# Patient Record
Sex: Female | Born: 1955 | Race: Black or African American | Hispanic: No | Smoking: Former smoker
Health system: Southern US, Community
[De-identification: ages and names within clinical notes are randomized; demographics above are authoritative.]

## PROBLEM LIST (undated history)

## (undated) DIAGNOSIS — R112 Nausea with vomiting, unspecified: Secondary | ICD-10-CM

## (undated) DIAGNOSIS — M545 Low back pain, unspecified: Secondary | ICD-10-CM

## (undated) DIAGNOSIS — J189 Pneumonia, unspecified organism: Secondary | ICD-10-CM

## (undated) DIAGNOSIS — K5090 Crohn's disease, unspecified, without complications: Secondary | ICD-10-CM

## (undated) DIAGNOSIS — M1990 Unspecified osteoarthritis, unspecified site: Secondary | ICD-10-CM

## (undated) DIAGNOSIS — Z932 Ileostomy status: Secondary | ICD-10-CM

## (undated) DIAGNOSIS — F419 Anxiety disorder, unspecified: Secondary | ICD-10-CM

## (undated) DIAGNOSIS — I10 Essential (primary) hypertension: Secondary | ICD-10-CM

## (undated) DIAGNOSIS — N189 Chronic kidney disease, unspecified: Secondary | ICD-10-CM

## (undated) DIAGNOSIS — R102 Pelvic and perineal pain: Secondary | ICD-10-CM

## (undated) DIAGNOSIS — K6289 Other specified diseases of anus and rectum: Secondary | ICD-10-CM

## (undated) DIAGNOSIS — D649 Anemia, unspecified: Secondary | ICD-10-CM

## (undated) DIAGNOSIS — Z9289 Personal history of other medical treatment: Secondary | ICD-10-CM

## (undated) DIAGNOSIS — F329 Major depressive disorder, single episode, unspecified: Secondary | ICD-10-CM

## (undated) DIAGNOSIS — K219 Gastro-esophageal reflux disease without esophagitis: Secondary | ICD-10-CM

## (undated) DIAGNOSIS — Z9889 Other specified postprocedural states: Secondary | ICD-10-CM

## (undated) DIAGNOSIS — K5190 Ulcerative colitis, unspecified, without complications: Secondary | ICD-10-CM

## (undated) DIAGNOSIS — A0472 Enterocolitis due to Clostridium difficile, not specified as recurrent: Secondary | ICD-10-CM

## (undated) DIAGNOSIS — F32A Depression, unspecified: Secondary | ICD-10-CM

## (undated) HISTORY — DX: Essential (primary) hypertension: I10

## (undated) HISTORY — DX: Other specified diseases of anus and rectum: K62.89

## (undated) HISTORY — DX: Anxiety disorder, unspecified: F41.9

## (undated) HISTORY — DX: Depression, unspecified: F32.A

## (undated) HISTORY — DX: Major depressive disorder, single episode, unspecified: F32.9

## (undated) HISTORY — DX: Gastro-esophageal reflux disease without esophagitis: K21.9

## (undated) HISTORY — PX: ILEOSTOMY: SHX1783

## (undated) HISTORY — DX: Pelvic and perineal pain: R10.2

## (undated) HISTORY — DX: Low back pain, unspecified: M54.50

## (undated) HISTORY — DX: Chronic kidney disease, unspecified: N18.9

## (undated) HISTORY — DX: Crohn's disease, unspecified, without complications: K50.90

## (undated) HISTORY — PX: TUBAL LIGATION: SHX77

## (undated) HISTORY — DX: Low back pain: M54.5

## (undated) HISTORY — DX: Unspecified osteoarthritis, unspecified site: M19.90

## (undated) HISTORY — PX: TONSILLECTOMY: SUR1361

---

## 1981-04-20 HISTORY — PX: TUBAL LIGATION: SHX77

## 1998-03-04 ENCOUNTER — Other Ambulatory Visit: Admission: RE | Admit: 1998-03-04 | Discharge: 1998-03-04 | Payer: Self-pay | Admitting: Gastroenterology

## 1998-05-14 ENCOUNTER — Encounter: Payer: Self-pay | Admitting: Gastroenterology

## 1998-05-14 ENCOUNTER — Inpatient Hospital Stay (HOSPITAL_COMMUNITY): Admission: EM | Admit: 1998-05-14 | Discharge: 1998-05-21 | Payer: Self-pay | Admitting: Emergency Medicine

## 1998-06-21 ENCOUNTER — Inpatient Hospital Stay (HOSPITAL_COMMUNITY): Admission: AD | Admit: 1998-06-21 | Discharge: 1998-07-03 | Payer: Self-pay | Admitting: Gastroenterology

## 1998-06-21 ENCOUNTER — Encounter: Payer: Self-pay | Admitting: Gastroenterology

## 1998-06-22 ENCOUNTER — Encounter: Payer: Self-pay | Admitting: Gastroenterology

## 1998-09-10 ENCOUNTER — Inpatient Hospital Stay (HOSPITAL_COMMUNITY): Admission: RE | Admit: 1998-09-10 | Discharge: 1998-09-18 | Payer: Self-pay | Admitting: General Surgery

## 1999-04-21 HISTORY — PX: COLECTOMY: SHX59

## 2001-11-30 ENCOUNTER — Ambulatory Visit (HOSPITAL_COMMUNITY): Admission: RE | Admit: 2001-11-30 | Discharge: 2001-11-30 | Payer: Self-pay | Admitting: General Surgery

## 2001-11-30 ENCOUNTER — Encounter: Payer: Self-pay | Admitting: General Surgery

## 2001-12-02 ENCOUNTER — Encounter: Payer: Self-pay | Admitting: General Surgery

## 2001-12-02 ENCOUNTER — Ambulatory Visit (HOSPITAL_COMMUNITY): Admission: RE | Admit: 2001-12-02 | Discharge: 2001-12-02 | Payer: Self-pay | Admitting: General Surgery

## 2001-12-09 ENCOUNTER — Ambulatory Visit (HOSPITAL_COMMUNITY): Admission: RE | Admit: 2001-12-09 | Discharge: 2001-12-09 | Payer: Self-pay | Admitting: General Surgery

## 2001-12-09 ENCOUNTER — Encounter: Payer: Self-pay | Admitting: General Surgery

## 2001-12-09 ENCOUNTER — Encounter (INDEPENDENT_AMBULATORY_CARE_PROVIDER_SITE_OTHER): Payer: Self-pay | Admitting: Specialist

## 2002-07-04 ENCOUNTER — Ambulatory Visit (HOSPITAL_COMMUNITY): Admission: RE | Admit: 2002-07-04 | Discharge: 2002-07-04 | Payer: Self-pay | Admitting: Gastroenterology

## 2002-07-04 ENCOUNTER — Encounter: Payer: Self-pay | Admitting: Gastroenterology

## 2003-02-17 ENCOUNTER — Encounter: Admission: RE | Admit: 2003-02-17 | Discharge: 2003-02-17 | Payer: Self-pay | Admitting: Internal Medicine

## 2003-10-13 ENCOUNTER — Encounter: Admission: RE | Admit: 2003-10-13 | Discharge: 2003-10-13 | Payer: Self-pay | Admitting: Internal Medicine

## 2004-05-07 ENCOUNTER — Ambulatory Visit: Payer: Self-pay | Admitting: Internal Medicine

## 2004-05-14 ENCOUNTER — Encounter: Admission: RE | Admit: 2004-05-14 | Discharge: 2004-08-12 | Payer: Self-pay | Admitting: Internal Medicine

## 2004-06-05 ENCOUNTER — Ambulatory Visit: Payer: Self-pay | Admitting: Internal Medicine

## 2004-07-03 ENCOUNTER — Ambulatory Visit: Payer: Self-pay | Admitting: Internal Medicine

## 2004-07-29 ENCOUNTER — Ambulatory Visit: Payer: Self-pay | Admitting: Internal Medicine

## 2004-08-04 ENCOUNTER — Ambulatory Visit: Payer: Self-pay | Admitting: Internal Medicine

## 2004-08-25 ENCOUNTER — Ambulatory Visit: Payer: Self-pay | Admitting: Internal Medicine

## 2004-09-01 ENCOUNTER — Ambulatory Visit: Payer: Self-pay | Admitting: Internal Medicine

## 2004-10-01 ENCOUNTER — Ambulatory Visit: Payer: Self-pay | Admitting: Internal Medicine

## 2004-11-18 ENCOUNTER — Ambulatory Visit: Payer: Self-pay | Admitting: Internal Medicine

## 2004-12-23 ENCOUNTER — Ambulatory Visit: Payer: Self-pay | Admitting: Internal Medicine

## 2005-03-19 ENCOUNTER — Ambulatory Visit: Payer: Self-pay | Admitting: Internal Medicine

## 2005-04-27 ENCOUNTER — Ambulatory Visit: Payer: Self-pay | Admitting: Internal Medicine

## 2005-06-30 ENCOUNTER — Ambulatory Visit: Payer: Self-pay | Admitting: Internal Medicine

## 2005-08-13 ENCOUNTER — Ambulatory Visit: Payer: Self-pay | Admitting: Internal Medicine

## 2005-08-26 ENCOUNTER — Ambulatory Visit: Payer: Self-pay | Admitting: Internal Medicine

## 2005-09-23 ENCOUNTER — Ambulatory Visit: Payer: Self-pay | Admitting: Internal Medicine

## 2005-10-12 ENCOUNTER — Ambulatory Visit: Payer: Self-pay | Admitting: Internal Medicine

## 2005-12-15 ENCOUNTER — Ambulatory Visit: Payer: Self-pay | Admitting: Internal Medicine

## 2006-02-15 ENCOUNTER — Ambulatory Visit: Payer: Self-pay | Admitting: Internal Medicine

## 2006-04-30 ENCOUNTER — Ambulatory Visit: Payer: Self-pay | Admitting: Internal Medicine

## 2006-04-30 LAB — CONVERTED CEMR LAB
Free T4: 0.7 ng/dL (ref 0.6–1.6)
TSH: 0.74 microintl units/mL (ref 0.35–5.50)

## 2006-05-17 ENCOUNTER — Ambulatory Visit: Payer: Self-pay | Admitting: Internal Medicine

## 2006-07-19 ENCOUNTER — Ambulatory Visit: Payer: Self-pay | Admitting: Internal Medicine

## 2006-08-14 ENCOUNTER — Ambulatory Visit: Payer: Self-pay | Admitting: Internal Medicine

## 2006-09-07 ENCOUNTER — Ambulatory Visit: Payer: Self-pay | Admitting: Internal Medicine

## 2006-09-07 LAB — CONVERTED CEMR LAB
ALT: 22 units/L (ref 0–40)
AST: 18 units/L (ref 0–37)
BUN: 19 mg/dL (ref 6–23)
Basophils Absolute: 0.1 10*3/uL (ref 0.0–0.1)
Basophils Relative: 0.9 % (ref 0.0–1.0)
Cholesterol: 172 mg/dL (ref 0–200)
Creatinine, Ser: 0.8 mg/dL (ref 0.4–1.2)
Eosinophils Absolute: 0 10*3/uL (ref 0.0–0.6)
Eosinophils Relative: 0.5 % (ref 0.0–5.0)
Glucose, Bld: 82 mg/dL (ref 70–99)
HCT: 35.2 % — ABNORMAL LOW (ref 36.0–46.0)
HDL: 62.5 mg/dL (ref >39.0)
Hemoglobin: 11.7 g/dL — ABNORMAL LOW (ref 12.0–15.0)
Hgb A1c MFr Bld: 5.7 % (ref 4.6–6.1)
LDL Cholesterol: 93 mg/dL (ref 0–99)
Lymphocytes Relative: 27.6 % (ref 12.0–46.0)
MCHC: 33.3 g/dL (ref 30.0–36.0)
MCV: 86.1 fL (ref 78.0–100.0)
Monocytes Absolute: 0.6 10*3/uL (ref 0.2–0.7)
Monocytes Relative: 7.1 % (ref 3.0–11.0)
Neutro Abs: 5.9 10*3/uL (ref 1.4–7.7)
Neutrophils Relative %: 63.9 % (ref 43.0–77.0)
Platelets: 311 10*3/uL (ref 150–400)
Potassium: 3.1 meq/L — ABNORMAL LOW (ref 3.5–5.1)
RBC: 4.09 M/uL (ref 3.87–5.11)
RDW: 14.4 % (ref 11.5–14.6)
Sed Rate: 17 mm/hr (ref 0–25)
Sodium: 138 meq/L (ref 135–145)
TSH: 0.64 microintl units/mL (ref 0.35–5.50)
Total CHOL/HDL Ratio: 2.8
Triglycerides: 84 mg/dL (ref 0–149)
VLDL: 17 mg/dL (ref 0–40)
Vit D, 1,25-Dihydroxy: 14 — ABNORMAL LOW (ref 20–57)
Vitamin B-12: 783 pg/mL (ref 211–911)
WBC: 9.1 10*3/uL (ref 4.5–10.5)

## 2006-09-14 ENCOUNTER — Ambulatory Visit: Payer: Self-pay | Admitting: Internal Medicine

## 2006-11-08 DIAGNOSIS — K5090 Crohn's disease, unspecified, without complications: Secondary | ICD-10-CM

## 2006-11-08 DIAGNOSIS — F329 Major depressive disorder, single episode, unspecified: Secondary | ICD-10-CM

## 2006-11-08 DIAGNOSIS — F410 Panic disorder [episodic paroxysmal anxiety] without agoraphobia: Secondary | ICD-10-CM | POA: Insufficient documentation

## 2006-11-08 DIAGNOSIS — I10 Essential (primary) hypertension: Secondary | ICD-10-CM

## 2006-11-08 DIAGNOSIS — E538 Deficiency of other specified B group vitamins: Secondary | ICD-10-CM | POA: Insufficient documentation

## 2006-11-19 ENCOUNTER — Ambulatory Visit: Payer: Self-pay | Admitting: Internal Medicine

## 2007-02-03 ENCOUNTER — Ambulatory Visit: Payer: Self-pay | Admitting: Internal Medicine

## 2007-02-03 LAB — CONVERTED CEMR LAB
ALT: 16 units/L (ref 0–35)
AST: 16 units/L (ref 0–37)
Albumin: 3.6 g/dL (ref 3.5–5.2)
Alkaline Phosphatase: 66 units/L (ref 39–117)
BUN: 18 mg/dL (ref 6–23)
Basophils Absolute: 0.1 10*3/uL (ref 0.0–0.1)
Basophils Relative: 1.2 % — ABNORMAL HIGH (ref 0.0–1.0)
Bilirubin, Direct: 0.1 mg/dL (ref 0.0–0.3)
CO2: 27 meq/L (ref 19–32)
Calcium: 9.4 mg/dL (ref 8.4–10.5)
Chloride: 104 meq/L (ref 96–112)
Cholesterol: 165 mg/dL (ref 0–200)
Creatinine, Ser: 0.8 mg/dL (ref 0.4–1.2)
Eosinophils Absolute: 0.3 10*3/uL (ref 0.0–0.6)
Eosinophils Relative: 3.7 % (ref 0.0–5.0)
GFR calc Af Amer: 97 mL/min
GFR calc non Af Amer: 80 mL/min
Glucose, Bld: 95 mg/dL (ref 70–99)
HCT: 33.1 % — ABNORMAL LOW (ref 36.0–46.0)
HDL: 53.5 mg/dL (ref >39.0)
Hemoglobin: 11.2 g/dL — ABNORMAL LOW (ref 12.0–15.0)
LDL Cholesterol: 96 mg/dL (ref 0–99)
Lymphocytes Relative: 30.3 % (ref 12.0–46.0)
MCHC: 34 g/dL (ref 30.0–36.0)
MCV: 84 fL (ref 78.0–100.0)
Monocytes Absolute: 0.6 10*3/uL (ref 0.2–0.7)
Monocytes Relative: 7.6 % (ref 3.0–11.0)
Neutro Abs: 4.9 10*3/uL (ref 1.4–7.7)
Neutrophils Relative %: 57.2 % (ref 43.0–77.0)
Platelets: 277 10*3/uL (ref 150–400)
Potassium: 4.1 meq/L (ref 3.5–5.1)
RBC: 3.94 M/uL (ref 3.87–5.11)
RDW: 16.2 % — ABNORMAL HIGH (ref 11.5–14.6)
Sodium: 139 meq/L (ref 135–145)
TSH: 0.91 microintl units/mL (ref 0.35–5.50)
Total Bilirubin: 0.6 mg/dL (ref 0.3–1.2)
Total CHOL/HDL Ratio: 3.1
Total Protein: 7.2 g/dL (ref 6.0–8.3)
Triglycerides: 79 mg/dL (ref 0–149)
VLDL: 16 mg/dL (ref 0–40)
Vit D, 1,25-Dihydroxy: 23 — ABNORMAL LOW (ref 30–89)
WBC: 8.4 10*3/uL (ref 4.5–10.5)

## 2007-02-17 ENCOUNTER — Encounter: Payer: Self-pay | Admitting: Internal Medicine

## 2007-02-17 ENCOUNTER — Ambulatory Visit: Payer: Self-pay | Admitting: Internal Medicine

## 2007-02-17 DIAGNOSIS — F419 Anxiety disorder, unspecified: Secondary | ICD-10-CM | POA: Insufficient documentation

## 2007-02-17 DIAGNOSIS — F411 Generalized anxiety disorder: Secondary | ICD-10-CM

## 2007-02-17 DIAGNOSIS — M545 Low back pain, unspecified: Secondary | ICD-10-CM | POA: Insufficient documentation

## 2007-02-17 DIAGNOSIS — K219 Gastro-esophageal reflux disease without esophagitis: Secondary | ICD-10-CM

## 2007-03-25 ENCOUNTER — Telehealth: Payer: Self-pay | Admitting: Internal Medicine

## 2007-04-26 ENCOUNTER — Ambulatory Visit: Payer: Self-pay | Admitting: Internal Medicine

## 2007-04-26 DIAGNOSIS — J069 Acute upper respiratory infection, unspecified: Secondary | ICD-10-CM

## 2007-05-26 ENCOUNTER — Telehealth: Payer: Self-pay | Admitting: Internal Medicine

## 2007-06-06 ENCOUNTER — Encounter: Payer: Self-pay | Admitting: Internal Medicine

## 2007-06-13 ENCOUNTER — Telehealth: Payer: Self-pay | Admitting: Internal Medicine

## 2007-07-06 ENCOUNTER — Telehealth: Payer: Self-pay | Admitting: Internal Medicine

## 2007-07-15 ENCOUNTER — Ambulatory Visit: Payer: Self-pay | Admitting: Internal Medicine

## 2007-07-15 DIAGNOSIS — R635 Abnormal weight gain: Secondary | ICD-10-CM | POA: Insufficient documentation

## 2007-07-15 DIAGNOSIS — E559 Vitamin D deficiency, unspecified: Secondary | ICD-10-CM | POA: Insufficient documentation

## 2007-09-16 ENCOUNTER — Ambulatory Visit: Payer: Self-pay | Admitting: Internal Medicine

## 2007-09-16 DIAGNOSIS — M79609 Pain in unspecified limb: Secondary | ICD-10-CM | POA: Insufficient documentation

## 2007-09-21 ENCOUNTER — Encounter: Payer: Self-pay | Admitting: Internal Medicine

## 2007-09-21 ENCOUNTER — Ambulatory Visit: Payer: Self-pay

## 2007-10-07 ENCOUNTER — Ambulatory Visit: Payer: Self-pay | Admitting: Internal Medicine

## 2007-10-08 LAB — CONVERTED CEMR LAB
Albumin: 3.7 g/dL (ref 3.5–5.2)
BUN: 9 mg/dL (ref 6–23)
Calcium: 9.5 mg/dL (ref 8.4–10.5)
Eosinophils Absolute: 0.3 10*3/uL (ref 0.0–0.7)
Eosinophils Relative: 4.5 % (ref 0.0–5.0)
GFR calc Af Amer: 135 mL/min
GFR calc non Af Amer: 112 mL/min
Glucose, Bld: 101 mg/dL — ABNORMAL HIGH (ref 70–99)
HCT: 34.4 % — ABNORMAL LOW (ref 36.0–46.0)
Hemoglobin: 12 g/dL (ref 12.0–15.0)
MCV: 85.8 fL (ref 78.0–100.0)
Monocytes Absolute: 0.5 10*3/uL (ref 0.1–1.0)
Monocytes Relative: 7.1 % (ref 3.0–12.0)
Neutro Abs: 4.2 10*3/uL (ref 1.4–7.7)
Platelets: 264 10*3/uL (ref 150–400)
RDW: 15 % — ABNORMAL HIGH (ref 11.5–14.6)
TSH: 0.6 microintl units/mL (ref 0.35–5.50)
Total Protein: 6.9 g/dL (ref 6.0–8.3)
Vitamin B-12: 671 pg/mL (ref 211–911)
WBC: 7.3 10*3/uL (ref 4.5–10.5)

## 2007-10-10 ENCOUNTER — Telehealth (INDEPENDENT_AMBULATORY_CARE_PROVIDER_SITE_OTHER): Payer: Self-pay | Admitting: *Deleted

## 2007-10-12 ENCOUNTER — Ambulatory Visit: Payer: Self-pay | Admitting: Internal Medicine

## 2007-10-17 ENCOUNTER — Telehealth (INDEPENDENT_AMBULATORY_CARE_PROVIDER_SITE_OTHER): Payer: Self-pay | Admitting: *Deleted

## 2007-10-18 ENCOUNTER — Ambulatory Visit: Payer: Self-pay | Admitting: Internal Medicine

## 2007-10-18 DIAGNOSIS — R5382 Chronic fatigue, unspecified: Secondary | ICD-10-CM

## 2007-10-18 DIAGNOSIS — G4700 Insomnia, unspecified: Secondary | ICD-10-CM

## 2007-10-24 ENCOUNTER — Telehealth: Payer: Self-pay | Admitting: Internal Medicine

## 2007-11-16 ENCOUNTER — Ambulatory Visit: Payer: Self-pay | Admitting: Internal Medicine

## 2007-12-23 ENCOUNTER — Ambulatory Visit: Payer: Self-pay | Admitting: Internal Medicine

## 2007-12-28 LAB — CONVERTED CEMR LAB
Calcium: 9.2 mg/dL (ref 8.4–10.5)
GFR calc Af Amer: 113 mL/min
GFR calc non Af Amer: 93 mL/min
Glucose, Bld: 91 mg/dL (ref 70–99)
Potassium: 3.2 meq/L — ABNORMAL LOW (ref 3.5–5.1)
Sodium: 141 meq/L (ref 135–145)
Vit D, 1,25-Dihydroxy: 18 — ABNORMAL LOW (ref 30–89)
Vitamin B-12: 707 pg/mL (ref 211–911)

## 2008-01-20 ENCOUNTER — Ambulatory Visit: Payer: Self-pay | Admitting: Internal Medicine

## 2008-03-06 ENCOUNTER — Telehealth: Payer: Self-pay | Admitting: Internal Medicine

## 2008-03-20 ENCOUNTER — Ambulatory Visit: Payer: Self-pay | Admitting: Internal Medicine

## 2008-05-09 ENCOUNTER — Ambulatory Visit: Payer: Self-pay | Admitting: Internal Medicine

## 2008-05-09 LAB — CONVERTED CEMR LAB
CO2: 27 meq/L (ref 19–32)
Calcium: 9.2 mg/dL (ref 8.4–10.5)
Creatinine, Ser: 0.7 mg/dL (ref 0.4–1.2)
GFR calc Af Amer: 113 mL/min
Sodium: 138 meq/L (ref 135–145)

## 2008-05-22 ENCOUNTER — Ambulatory Visit: Payer: Self-pay | Admitting: Internal Medicine

## 2008-07-16 ENCOUNTER — Encounter: Payer: Self-pay | Admitting: Internal Medicine

## 2008-08-13 ENCOUNTER — Ambulatory Visit: Payer: Self-pay | Admitting: Internal Medicine

## 2008-08-13 DIAGNOSIS — M797 Fibromyalgia: Secondary | ICD-10-CM | POA: Insufficient documentation

## 2008-10-11 ENCOUNTER — Encounter: Payer: Self-pay | Admitting: Internal Medicine

## 2008-10-25 ENCOUNTER — Telehealth: Payer: Self-pay | Admitting: Internal Medicine

## 2008-10-27 ENCOUNTER — Ambulatory Visit: Payer: Self-pay | Admitting: Family Medicine

## 2008-10-29 ENCOUNTER — Telehealth: Payer: Self-pay | Admitting: Internal Medicine

## 2008-10-31 ENCOUNTER — Telehealth: Payer: Self-pay | Admitting: Internal Medicine

## 2008-11-05 ENCOUNTER — Telehealth: Payer: Self-pay | Admitting: Internal Medicine

## 2008-11-06 ENCOUNTER — Ambulatory Visit: Payer: Self-pay | Admitting: Internal Medicine

## 2008-11-06 DIAGNOSIS — J189 Pneumonia, unspecified organism: Secondary | ICD-10-CM

## 2008-11-06 DIAGNOSIS — R05 Cough: Secondary | ICD-10-CM

## 2008-11-09 ENCOUNTER — Ambulatory Visit: Payer: Self-pay | Admitting: Internal Medicine

## 2008-11-20 ENCOUNTER — Ambulatory Visit: Payer: Self-pay | Admitting: Internal Medicine

## 2009-01-03 ENCOUNTER — Telehealth: Payer: Self-pay | Admitting: Internal Medicine

## 2009-01-04 ENCOUNTER — Telehealth: Payer: Self-pay | Admitting: Internal Medicine

## 2009-01-07 ENCOUNTER — Telehealth: Payer: Self-pay | Admitting: Internal Medicine

## 2009-02-14 ENCOUNTER — Encounter (INDEPENDENT_AMBULATORY_CARE_PROVIDER_SITE_OTHER): Payer: Self-pay | Admitting: *Deleted

## 2009-02-14 ENCOUNTER — Ambulatory Visit: Payer: Self-pay | Admitting: Internal Medicine

## 2009-02-14 DIAGNOSIS — R1011 Right upper quadrant pain: Secondary | ICD-10-CM | POA: Insufficient documentation

## 2009-02-14 LAB — CONVERTED CEMR LAB
Albumin: 4 g/dL (ref 3.5–5.2)
Basophils Relative: 1.3 % (ref 0.0–3.0)
Bilirubin, Direct: 0.1 mg/dL (ref 0.0–0.3)
CO2: 26 meq/L (ref 19–32)
Calcium: 9.1 mg/dL (ref 8.4–10.5)
Creatinine, Ser: 0.6 mg/dL (ref 0.4–1.2)
Eosinophils Relative: 3.9 % (ref 0.0–5.0)
GFR calc non Af Amer: 134.29 mL/min (ref >60)
Hemoglobin: 12.6 g/dL (ref 12.0–15.0)
Lymphocytes Relative: 33.5 % (ref 12.0–46.0)
MCV: 87.9 fL (ref 78.0–100.0)
Neutrophils Relative %: 55.5 % (ref 43.0–77.0)
RBC: 4.21 M/uL (ref 3.87–5.11)
Specific Gravity, Urine: 1.025 (ref 1.000–1.030)
Total Protein, Urine: NEGATIVE mg/dL
Total Protein: 8.1 g/dL (ref 6.0–8.3)
Urine Glucose: NEGATIVE mg/dL
Vitamin B-12: 1439 pg/mL — ABNORMAL HIGH (ref 211–911)
WBC: 8 10*3/uL (ref 4.5–10.5)

## 2009-04-16 ENCOUNTER — Ambulatory Visit: Payer: Self-pay | Admitting: Internal Medicine

## 2009-04-20 HISTORY — PX: ILEOSTOMY: SHX1783

## 2009-05-29 ENCOUNTER — Telehealth: Payer: Self-pay | Admitting: Internal Medicine

## 2009-06-07 ENCOUNTER — Ambulatory Visit: Payer: Self-pay | Admitting: Internal Medicine

## 2009-06-07 LAB — CONVERTED CEMR LAB
BUN: 15 mg/dL (ref 6–23)
CO2: 29 meq/L (ref 19–32)
Calcium: 9.5 mg/dL (ref 8.4–10.5)
Chloride: 98 meq/L (ref 96–112)
Creatinine, Ser: 0.6 mg/dL (ref 0.4–1.2)
GFR calc non Af Amer: 134.14 mL/min (ref >60)
Glucose, Bld: 88 mg/dL (ref 70–99)
Potassium: 3.5 meq/L (ref 3.5–5.1)
Sodium: 138 meq/L (ref 135–145)

## 2009-06-18 ENCOUNTER — Ambulatory Visit: Payer: Self-pay | Admitting: Internal Medicine

## 2009-06-18 DIAGNOSIS — M25569 Pain in unspecified knee: Secondary | ICD-10-CM

## 2009-06-18 DIAGNOSIS — M25561 Pain in right knee: Secondary | ICD-10-CM | POA: Insufficient documentation

## 2009-06-18 DIAGNOSIS — M1990 Unspecified osteoarthritis, unspecified site: Secondary | ICD-10-CM

## 2009-08-21 ENCOUNTER — Ambulatory Visit: Payer: Self-pay | Admitting: Internal Medicine

## 2009-10-23 ENCOUNTER — Ambulatory Visit: Payer: Self-pay | Admitting: Internal Medicine

## 2009-10-23 DIAGNOSIS — F4321 Adjustment disorder with depressed mood: Secondary | ICD-10-CM | POA: Insufficient documentation

## 2009-10-25 ENCOUNTER — Telehealth: Payer: Self-pay | Admitting: Internal Medicine

## 2009-12-17 ENCOUNTER — Ambulatory Visit: Payer: Self-pay | Admitting: Internal Medicine

## 2009-12-17 LAB — CONVERTED CEMR LAB
Alkaline Phosphatase: 77 units/L (ref 39–117)
Basophils Relative: 0.5 % (ref 0.0–3.0)
Bilirubin, Direct: 0.1 mg/dL (ref 0.0–0.3)
CO2: 26 meq/L (ref 19–32)
Calcium: 9.4 mg/dL (ref 8.4–10.5)
Chloride: 102 meq/L (ref 96–112)
Eosinophils Absolute: 0.3 10*3/uL (ref 0.0–0.7)
Lymphs Abs: 2.8 10*3/uL (ref 0.7–4.0)
MCHC: 34.3 g/dL (ref 30.0–36.0)
MCV: 88.5 fL (ref 78.0–100.0)
Monocytes Absolute: 0.4 10*3/uL (ref 0.1–1.0)
Neutro Abs: 3.3 10*3/uL (ref 1.4–7.7)
Neutrophils Relative %: 47.9 % (ref 43.0–77.0)
RBC: 4 M/uL (ref 3.87–5.11)
Sodium: 139 meq/L (ref 135–145)
Total Protein: 7.3 g/dL (ref 6.0–8.3)
Vitamin B-12: 1035 pg/mL — ABNORMAL HIGH (ref 211–911)

## 2009-12-25 ENCOUNTER — Ambulatory Visit: Payer: Self-pay | Admitting: Internal Medicine

## 2010-02-27 ENCOUNTER — Ambulatory Visit: Payer: Self-pay | Admitting: Internal Medicine

## 2010-04-12 ENCOUNTER — Emergency Department (HOSPITAL_COMMUNITY)
Admission: EM | Admit: 2010-04-12 | Discharge: 2010-04-13 | Payer: Self-pay | Source: Home / Self Care | Admitting: Emergency Medicine

## 2010-05-20 NOTE — Assessment & Plan Note (Signed)
Summary: 2 MO ROV /NWS  #   Vital Signs:  Patient profile:   55 year old female Height:      65 inches Weight:      265 pounds BMI:     44.26 O2 Sat:      95 % on Room air Temp:     98.3 degrees F oral Pulse rate:   105 / minute Pulse rhythm:   regular Resp:     16 per minute BP sitting:   102 / 58  (left arm) Cuff size:   large  Vitals Entered By: Jonathon Resides, CMA(AAMA) (December 25, 2009 11:13 AM)  O2 Flow:  Room air CC: 2 mo f/u Comments pt needs Rf on Oxycodone, Phentermine, Synthroid, Lotrisone cream and Diflucan.   She is not taking Vit b12 or Hyzaar....she takes Benicar instead but is unsure of the dose.   CC:  2 mo f/u.  History of Present Illness: The patient presents for a follow up of abd pain and back pain, anxiety, depression. The patient presents with complaints of sore throat, fever, cough, sinus congestion and drainge of several days duration. Not better with OTC meds.   The mucus is colored.    Current Medications (verified): 1)  Oxycodone Hcl 15 Mg Tabs (Oxycodone Hcl) .Marland Kitchen.. 1 By Mouth Three To Four  Times A Day As Needed Pain. Please,  Fill On or After 11/18/09   . Ok To Fill 1-2 Day Earlier When The Month Has 31 Days in It. 2)  Phentermine Hcl 37.5 Mg Tabs (Phentermine Hcl) .Marland Kitchen.. 1 By Mouth Qam 3)  Synthroid 25 Mcg  Tabs (Levothyroxine Sodium) .Marland Kitchen.. 1 By Mouth Once Daily 4)  Neuro B-12 S 1000 Mcg/ml Inj Soln (Cyanocobalamin) .... Q 6 Wks 5)  Vitamin D3 1000 Unit  Tabs (Cholecalciferol) .... 3 By Mouth Once Daily 6)  Lotrisone 1-0.05 % Crea (Clotrimazole-Betamethasone) .... Use Two Times A Day X 4 Wks Prn 7)  Bd Integra Syringe 25g X 1" 3 Ml Misc (Syringe/needle (Disp)) .... For B12 Shots 8)  Savella 50 Mg Tabs (Milnacipran Hcl) .Marland Kitchen.. 1 By Mouth Bid 9)  Align  Caps (Probiotic Product) .Marland Kitchen.. 1 By Mouth Qd 10)  Hyzaar 100-25 Mg Tabs (Losartan Potassium-Hctz) .Marland Kitchen.. 1 By Mouth Qd 11)  Diflucan 150 Mg Tabs (Fluconazole) .Marland Kitchen.. 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  The patient denies chest pain and dyspnea on exertion.    Physical Exam  General:  NAD overweight-appearing.   Nose:  Erythematous throat mucosa and intranasal erythema.  Mouth:   no oropharyngeal exudate, no pharyngeal erythema.   Neck:  no carotid bruit or thyromegaly .nodes Lungs:  CTA Heart:  RRR Abdomen:  Colostomy, NT Msk:  Lumbar-sacral spine is tender to palpation over paraspinal muscles and painfull with the ROM, B knees are sensitive to palpation R>L Extremities:  No edema Neurologic:  No cranial nerve deficits noted. Station and gait are normal. Plantar reflexes are down-going  bilaterally. DTRs are symmetrical throughout. Sensory, motor and coordinative functions appear intact. Skin:  Clear Psych:  Oriented X3 subdued.     Impression & Recommendations:  Problem # 1:  FIBROMYALGIA (ICD-729.1) Assessment Unchanged  Her updated medication list for this problem includes:    Oxycodone Hcl 15 Mg Tabs (Oxycodone hcl) .Marland Kitchen... 1 by mouth three to four  times a day as needed pain. please,  fill on or after 02/01/10   . ok to fill 1-2 day earlier when the month has 31 days in it.  Problem # 2:  WEIGHT GAIN (ICD-783.1) Assessment: Deteriorated Restart Phentermine  Problem # 3:  LOW BACK PAIN (ICD-724.2) Assessment: Unchanged  Her updated medication list for  this problem includes:    Oxycodone Hcl 15 Mg Tabs (Oxycodone hcl) .Marland Kitchen... 1 by mouth three to four  times a day as needed pain. please,  fill on or after 02/01/10   . ok to fill 1-2 day earlier when the month has 31 days in it.  Problem # 4:  ANXIETY (ICD-300.00) Assessment: Unchanged  Problem # 5:  GERD (ICD-530.81) Assessment: Improved  Complete Medication List: 1)  Oxycodone Hcl 15 Mg Tabs (Oxycodone hcl) .Marland Kitchen.. 1 by mouth three to four  times a day as needed pain. please,  fill on or after 02/01/10   . ok to fill 1-2 day earlier when the month has 31 days in it. 2)  Phentermine Hcl 37.5 Mg Tabs (Phentermine hcl) .Marland Kitchen.. 1 by mouth qam 3)  Synthroid 25 Mcg Tabs (Levothyroxine sodium) .Marland Kitchen.. 1 by mouth once daily 4)  Vitamin D3 1000 Unit Tabs (Cholecalciferol) .... 3 by mouth once daily 5)  Lotrisone 1-0.05 % Crea (Clotrimazole-betamethasone) .... Use two times a day x 4 wks prn 6)  Bd Integra Syringe 25g X 1" 3 Ml Misc (Syringe/needle (disp)) .... For b12 shots 7)  Savella 50 Mg Tabs (Milnacipran hcl) .Marland Kitchen.. 1 by mouth bid 8)  Align Caps (Probiotic product) .Marland Kitchen.. 1 by mouth qd 9)  Hyzaar 100-25 Mg Tabs (Losartan potassium-hctz) .Marland Kitchen.. 1 by mouth qd 10)  Diflucan 150 Mg Tabs (Fluconazole) .Marland Kitchen.. 1 by mouth once daily once for yeast infection 11)  Zithromax Z-pak 250 Mg Tabs (Azithromycin) .... As dirrected 12)  Cobal-1000 1000 Mcg/ml Soln (Cyanocobalamin) .Marland Kitchen.. 1 ml im or subcutaneously q 8 wks  Other Orders: Flu Vaccine 60yr + ((29798 Administration Flu vaccine - MCR (([id number]  Patient Instructions: 1)  Please schedule a follow-up appointment in 2 months. Prescriptions: SAVELLA 50 MG TABS (MILNACIPRAN HCL) 1 by mouth bid  #60 x 12   Entered and Authorized by:   ACassandria AngerMD   Signed by:   ACassandria AngerMD on 12/25/2009   Method used:   Print then Give to Patient   RxID:   [id number] INTEGRA SYRINGE 25G X 1" 3 ML MISC (SYRINGE/NEEDLE (DISP)) for B12 shots  #10 x 3    Entered and Authorized by:   ACassandria AngerMD   Signed by:   ACassandria AngerMD on 12/25/2009   Method used:   Print then Give to Patient   RxID:   [id number]-1000 1000 MCG/ML SOLN (CYANOCOBALAMIN) 1 ml IM or Subcutaneously q 8 wks  #157mx 12   Entered and Authorized by:   AlCassandria AngerD   Signed by:   AlCassandria AngerD on 12/25/2009   Method used:   Print then Give to Patient   RxID:  [id number] LOTRISONE 1-0.05 % CREA (CLOTRIMAZOLE-BETAMETHASONE) use two times a day x 4 wks prn  #90 g x 3   Entered and Authorized by:   Cassandria Anger MD   Signed by:   Cassandria Anger MD on 12/25/2009   Method used:   Print then Give to Patient   RxID:   [id number] PHENTERMINE HCL 37.5 MG TABS (PHENTERMINE HCL) 1 by mouth qam  #30 x 1   Entered and Authorized by:   Cassandria Anger MD   Signed by:   Cassandria Anger MD on 12/25/2009   Method used:   Print then Give to Patient   RxID:   [id number] DIFLUCAN 150 MG TABS (FLUCONAZOLE) 1 by mouth once daily once for yeast infection  #3 x 0   Entered and Authorized by:   Cassandria Anger MD   Signed by:   Cassandria Anger MD on 12/25/2009   Method used:   Print then Give to Patient   RxID:   [id number] ZITHROMAX Z-PAK 250 MG TABS (AZITHROMYCIN) as dirrected  #1 x 0   Entered and Authorized by:   Cassandria Anger MD   Signed by:   Cassandria Anger MD on 12/25/2009   Method used:   Print then Give to Patient   RxID:   [id number] OXYCODONE HCL 15 MG TABS (OXYCODONE HCL) 1 by mouth three to four  times a day as needed pain. Please,  fill on or after 02/01/10   . OK to fill 1-2 day earlier when the month has 31 days in it.  #120 x 0   Entered and Authorized by:   Cassandria Anger MD   Signed by:   Cassandria Anger MD on 12/25/2009   Method used:   Print then Give to Patient   RxID:   [id number] OXYCODONE HCL 15 MG TABS (OXYCODONE HCL) 1 by mouth three to  four  times a day as needed pain. Please,  fill on or after 01/02/10   . OK to fill 1-2 day earlier when the month has 31 days in it.  #120 x 0   Entered and Authorized by:   Cassandria Anger MD   Signed by:   Cassandria Anger MD on 12/25/2009   Method used:   Print then Give to Patient   RxID:   [id number]   .[email or web address]   Flu Vaccine Consent Questions     Do you have a history of severe allergic reactions to this vaccine? no    Any prior history of allergic reactions to egg and/or gelatin? no    Do you have a sensitivity to the preservative Thimersol? no    Do you have a past history of Guillan-Barre Syndrome? no    Do you currently have an acute febrile illness? no    Have you ever had a severe reaction to latex? no    Vaccine information given and explained to patient? yes    Are you currently pregnant? no    Lot Number:[id number]   Exp Date:10/17/2009   Site Given  Left Deltoid IM Jonathon Resides, [facility])  December 25, 2009 11:37 AM

## 2010-05-20 NOTE — Assessment & Plan Note (Signed)
Summary: 2 MO ROV /NWS  #   Vital Signs:  Patient profile:   55 year old female Height:      65 inches Weight:      260.25 pounds BMI:     43.46 Temp:     97.7 degrees F oral BP sitting:   100 / 66  (left arm) Cuff size:   large  Vitals Entered By: Ernestene Mention (Aug 21, 2009 9:05 AM) CC: 2 mo rtn ov./kb Is Patient Diabetic? No Pain Assessment Patient in pain? no        CC:  2 mo rtn ov./kb.  History of Present Illness: The patient presents for a follow up of back pain, anxiety, depression and knee pain.   Current Medications (verified): 1)  Phentermine Hcl 37.5 Mg Tabs (Phentermine Hcl) .Marland Kitchen.. 1 By Mouth Qam 2)  Synthroid 25 Mcg  Tabs (Levothyroxine Sodium) .Marland Kitchen.. 1 By Mouth Once Daily 3)  Neuro B-12 S 1000 Mcg/ml Inj Soln (Cyanocobalamin) .... Q 6 Wks 4)  Vitamin D3 1000 Unit  Tabs (Cholecalciferol) .... 3 By Mouth Once Daily 5)  Lotrisone 1-0.05 % Crea (Clotrimazole-Betamethasone) .... Use Two Times A Day X 4 Wks Prn 6)  Bd Integra Syringe 25g X 1" 3 Ml Misc (Syringe/needle (Disp)) .... For B12 Shots 7)  Savella 50 Mg Tabs (Milnacipran Hcl) .Marland Kitchen.. 1 By Mouth Bid 8)  Align  Caps (Probiotic Product) .Marland Kitchen.. 1 By Mouth Qd 9)  Hyzaar 100-25 Mg Tabs (Losartan Potassium-Hctz) .Marland Kitchen.. 1 By Mouth Qd 10)  Oxycodone Hcl 15 Mg Tabs (Oxycodone Hcl) .Marland Kitchen.. 1 By Mouth Two Times A Day As Needed Pain. Please,  Fill On or After 07/19/09   . Ok To Fill 1-2 Day Earlier When The Month Has 31 Days in It.  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         R knee pain is bad  Physical Exam  General:  NAD overweight-appearing.   Nose:  Erythematous throat mucosa and intranasal erythema.  Mouth:   no oropharyngeal exudate, no pharyngeal erythema.   Lungs:  CTA Heart:  RRR Abdomen:  Colostomy, NT Msk:  Lumbar-sacral spine is tender to palpation over paraspinal muscles and painfull with the ROM, B knees are sensitive to palpation R>L Extremities:  No edema Neurologic:  No cranial nerve deficits noted. Station and gait are normal. Plantar reflexes are down-going bilaterally. DTRs are symmetrical throughout. Sensory, motor and coordinative functions appear intact. Skin:  Clear Psych:  Oriented X3 subdued.     Impression & Recommendations:  Problem # 1:  KNEE PAIN (ICD-719.46) R Assessment Deteriorated She is contemplating R TKR Her updated medication list for this problem includes:    Oxycodone Hcl 15 Mg Tabs (Oxycodone hcl) .Marland Kitchen... 1 by mouth three to four  times a day as needed pain. please,  fill on or after 09/18/09   . ok to fill 1-2 day earlier when the month has 31 days in it.  Problem # 2:  OSTEOARTHRITIS (ICD-715.90) Assessment: Unchanged  Her updated medication list for this problem includes:  Oxycodone Hcl 15 Mg Tabs (Oxycodone hcl) .Marland Kitchen... 1 by mouth three to four  times a day as needed pain. please,  fill on or after 09/18/09   . ok to fill 1-2 day earlier when the month has 31 days in it.  Problem # 3:  LOW BACK PAIN (ICD-724.2)  Her updated medication list for this problem includes:    Oxycodone Hcl 15 Mg Tabs (Oxycodone hcl) .Marland Kitchen... 1 by mouth three to four  times a day as needed pain. please,  fill on or after 09/18/09   . ok to fill 1-2 day earlier when the month has 31 days in it.  Problem # 4:  WEIGHT GAIN (ICD-783.1) Assessment: Improved  Problem # 5:  CROHN'S DISEASE (ICD-555.9) Assessment: Comment Only  Problem # 6:  B12  DEFICIENCY (ICD-266.2) Assessment: Unchanged On prescription drug  therapy   Complete Medication List: 1)  Oxycodone Hcl 15 Mg Tabs (Oxycodone hcl) .Marland Kitchen.. 1 by mouth three to four  times a day as needed pain. please,  fill on or after 09/18/09   . ok to fill 1-2 day earlier when the month has 31 days in it. 2)  Phentermine Hcl 37.5 Mg Tabs (Phentermine hcl) .Marland Kitchen.. 1 by mouth qam 3)  Synthroid 25 Mcg Tabs (Levothyroxine sodium) .Marland Kitchen.. 1 by mouth once daily 4)  Neuro B-12 S 1000 Mcg/ml Inj Soln (Cyanocobalamin) .... Q 6 wks 5)  Vitamin D3 1000 Unit Tabs (Cholecalciferol) .... 3 by mouth once daily 6)  Lotrisone 1-0.05 % Crea (Clotrimazole-betamethasone) .... Use two times a day x 4 wks prn 7)  Bd Integra Syringe 25g X 1" 3 Ml Misc (Syringe/needle (disp)) .... For b12 shots 8)  Savella 50 Mg Tabs (Milnacipran hcl) .Marland Kitchen.. 1 by mouth bid 9)  Align Caps (Probiotic product) .Marland Kitchen.. 1 by mouth qd 10)  Hyzaar 100-25 Mg Tabs (Losartan potassium-hctz) .Marland Kitchen.. 1 by mouth qd  Patient Instructions: 1)  Please schedule a follow-up appointment in 2 months. 2)  BMP prior to visit, ICD-9: 3)  Hepatic Panel prior to visit, ICD-9: 4)  CBC w/ Diff prior to visit, ICD-9: 5)  Vit B12  266.20  995.20 Prescriptions: OXYCODONE HCL 15 MG TABS (OXYCODONE HCL) 1 by mouth three to four  times a day as needed pain. Please,  fill on or after 09/18/09   . OK to fill 1-2 day earlier when the month has 31 days in it.  #120 x 0   Entered and Authorized by:   Cassandria Anger MD   Signed by:   Cassandria Anger MD on 08/21/2009   Method used:   Print then Give to Patient   RxID:   [id number] OXYCODONE HCL 15 MG TABS (OXYCODONE HCL) 1 by mouth three to four  times a day as needed pain. Please,  fill on or after 08/18/09   . OK to fill 1-2 day earlier when the month has 31 days in it.  #120 x 0   Entered and Authorized by:   Cassandria Anger MD   Signed by:   Cassandria Anger MD on 08/21/2009   Method used:   Print then Give  to Patient   RxID:   [id number] PHENTERMINE HCL 37.5 MG TABS (PHENTERMINE HCL) 1 by mouth qam  #30 x 1   Entered and Authorized by:   Cassandria Anger MD   Signed by:   Cassandria Anger MD on 08/21/2009   Method used:   Print then Give  to Patient   RxID:   [id number]

## 2010-05-20 NOTE — Assessment & Plan Note (Signed)
Summary: 2 MTH FU--STC   Vital Signs:  Patient profile:   55 year old female Height:      65 inches (165.10 cm) Weight:      257 pounds (116.82 kg) BMI:     42.92 O2 Sat:      98 % on Room air Temp:     98.7 degrees F (37.06 degrees C) oral Pulse rate:   92 / minute Pulse rhythm:   regular Resp:     16 per minute BP sitting:   110 / 70  (right arm) Cuff size:   large  Vitals Entered By: Jonathon Resides, CMA(AAMA) (October 23, 2009 9:38 AM)  O2 Flow:  Room air CC: 2 mo f/u Is Patient Diabetic? No Comments pt's mother recently passed last week.  C/O depression. she is requesting RF on Synthroid 30mg, Lotrisone1-0.05% cream and Vit b12 solun.   CC:  2 mo f/u.  History of Present Illness: The patient presents for a follow up of back pain, anxiety, depression and headaches. Her mother died last wk...  Current Medications (verified): 1)  Oxycodone Hcl 15 Mg Tabs (Oxycodone Hcl) ..Marland Kitchen. 1 By Mouth Three To Four  Times A Day As Needed Pain. Please,  Fill On or After 09/18/09   . Ok To Fill 1-2 Day Earlier When The Month Has 31 Days in It. 2)  Phentermine Hcl 37.5 Mg Tabs (Phentermine Hcl) ..Marland Kitchen. 1 By Mouth Qam 3)  Synthroid 25 Mcg  Tabs (Levothyroxine Sodium) ..Marland Kitchen. 1 By Mouth Once Daily 4)  Neuro B-12 S 1000 Mcg/ml Inj Soln (Cyanocobalamin) .... Q 6 Wks 5)  Vitamin D3 1000 Unit  Tabs (Cholecalciferol) .... 3 By Mouth Once Daily 6)  Lotrisone 1-0.05 % Crea (Clotrimazole-Betamethasone) .... Use Two Times A Day X 4 Wks Prn 7)  Bd Integra Syringe 25g X 1" 3 Ml Misc (Syringe/needle (Disp)) .... For B12 Shots 8)  Savella 50 Mg Tabs (Milnacipran Hcl) ..Marland Kitchen. 1 By Mouth Bid 9)  Align  Caps (Probiotic Product) ..Marland Kitchen. 1 By Mouth Qd 10)  Hyzaar 100-25 Mg Tabs (Losartan Potassium-Hctz) ..Marland Kitchen. 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         griving  Physical Exam  General:  NAD overweight-appearing.   Nose:  Erythematous throat mucosa and intranasal erythema.  Mouth:   no oropharyngeal exudate, no pharyngeal erythema.   Neck:  no carotid bruit or thyromegaly .nodes Lungs:  CTA Heart:  RRR Abdomen:  Colostomy, NT Msk:  Lumbar-sacral spine is tender to palpation over paraspinal muscles and painfull with the ROM, B knees are sensitive to palpation R>L Extremities:  No edema Neurologic:  No cranial nerve deficits noted. Station and gait are normal. Plantar reflexes are down-going bilaterally. DTRs are symmetrical throughout. Sensory, motor and coordinative functions appear intact. Skin:  Clear Psych:  Oriented X3 subdued.     Impression & Recommendations:  Problem # 1:  FIBROMYALGIA (ICD-729.1) Assessment Unchanged  Her updated  medication list for this problem includes:    Oxycodone Hcl 15 Mg Tabs (Oxycodone hcl) .Marland Kitchen... 1 by mouth three to four  times a day as needed pain. please,  fill on or after 11/18/09   . ok to fill 1-2 day earlier when the month has 31 days in it.  Problem # 2:  FATIGUE (ICD-780.79) Assessment: Unchanged  Problem # 3:  LOW BACK PAIN (ICD-724.2) Assessment: Deteriorated See "Patient Instructions".  Her updated medication list for this problem includes:    Oxycodone Hcl 15 Mg Tabs (Oxycodone hcl) .Marland Kitchen... 1 by mouth three to four  times a day as needed pain. please,  fill on or after 11/18/09   . ok  to fill 1-2 day earlier when the month has 31 days in it.  Problem # 4:  WEIGHT GAIN (ICD-783.1) Assessment: Improved  Problem # 5:  B12 DEFICIENCY (ICD-266.2) Assessment: Unchanged On prescription drug  therapy   Problem # 6:  GRIEF REACTION (ICD-309.0) Assessment: New Discussed  Complete Medication List: 1)  Oxycodone Hcl 15 Mg Tabs (Oxycodone hcl) .Marland Kitchen.. 1 by mouth three to four  times a day as needed pain. please,  fill on or after 11/18/09   . ok to fill 1-2 day earlier when the month has 31 days in it. 2)  Phentermine Hcl 37.5 Mg Tabs (Phentermine hcl) .Marland Kitchen.. 1 by mouth qam 3)  Synthroid 25 Mcg Tabs (Levothyroxine sodium) .Marland Kitchen.. 1 by mouth once daily 4)  Neuro B-12 S 1000 Mcg/ml Inj Soln (Cyanocobalamin) .... Q 6 wks 5)  Vitamin D3 1000 Unit Tabs (Cholecalciferol) .... 3 by mouth once daily 6)  Lotrisone 1-0.05 % Crea (Clotrimazole-betamethasone) .... Use two times a day x 4 wks prn 7)  Bd Integra Syringe 25g X 1" 3 Ml Misc (Syringe/needle (disp)) .... For b12 shots 8)  Savella 50 Mg Tabs (Milnacipran hcl) .Marland Kitchen.. 1 by mouth bid 9)  Align Caps (Probiotic product) .Marland Kitchen.. 1 by mouth qd 10)  Hyzaar 100-25 Mg Tabs (Losartan potassium-hctz) .Marland Kitchen.. 1 by mouth qd  Patient Instructions: 1)  Please schedule a follow-up appointment in 2 months. 2)  BMP prior to visit, ICD-9: 3)  Hepatic Panel prior to visit, ICD-9: 4)  TSH prior to visit, ICD-9:790.29 2444.8 995.20 5)  CBC w/ Diff prior to visit, ICD-9: 6)  HbgA1C prior to visit, ICD-9: 7)  B12 level  Vit D 8)  Use stretching and balance exercises that I have provided (15 min. or longer every day) Prescriptions: OXYCODONE HCL 15 MG TABS (OXYCODONE HCL) 1 by mouth three to four  times a day as needed pain. Please,  fill on or after 11/18/09   . OK to fill 1-2 day earlier when the month has 31 days in it.  #120 x 0   Entered and Authorized by:   Cassandria Anger MD   Signed by:   Cassandria Anger MD on 10/23/2009   Method used:   Print then Give  to Patient   RxID:   [id number] PHENTERMINE HCL 37.5 MG TABS (PHENTERMINE HCL) 1 by mouth qam  #30 x 1   Entered and Authorized by:   Cassandria Anger MD   Signed by:   Cassandria Anger MD on 10/23/2009   Method used:   Print then Give to Patient   RxID:   [id number] OXYCODONE HCL 15 MG TABS (OXYCODONE HCL) 1 by mouth three to four  times a day as needed pain. Please,  fill on or after  10/18/09   . OK to fill 1-2 day earlier when the month has 31 days in it.  #90 x 0   Entered and Authorized by:   Cassandria Anger MD   Signed by:   Cassandria Anger MD on 10/23/2009   Method used:   Print then Give to Patient   RxID:   [id number]

## 2010-05-20 NOTE — Assessment & Plan Note (Signed)
Summary: 2 MO ROV /NWS  #   Vital Signs:  Patient profile:   55 year old female Height:      65 inches Weight:      271 pounds BMI:     45.26 O2 Sat:      93 % on Room air Temp:     98.0 degrees F oral Pulse rate:   102 / minute BP sitting:   118 / 76  (left arm) Cuff size:   large  Vitals Entered By: Glenda Chroman (February 27, 2010 4:23 PM)  O2 Flow:  Room air CC: pt here for follow up visit. /cp sma Comments pt stated she has switched back to benecar instead of hyzaar.   CC:  pt here for follow up visit. /cp sma.  History of Present Illness: The patient presents for a follow up of FMS,  back pain, anxiety, depression and headaches.   Current Medications (verified): 1)  Oxycodone Hcl 15 Mg Tabs (Oxycodone Hcl) .Marland Kitchen.. 1 By Mouth Three To Four  Times A Day As Needed Pain. Please,  Fill On or After 02/01/10   . Ok To Fill 1-2 Day Earlier When The Month Has 31 Days in It. 2)  Phentermine Hcl 37.5 Mg Tabs (Phentermine Hcl) .Marland Kitchen.. 1 By Mouth Qam 3)  Synthroid 25 Mcg  Tabs (Levothyroxine Sodium) .Marland Kitchen.. 1 By Mouth Once Daily 4)  Vitamin D3 1000 Unit  Tabs (Cholecalciferol) .... 3 By Mouth Once Daily 5)  Lotrisone 1-0.05 % Crea (Clotrimazole-Betamethasone) .... Use Two Times A Day X 4 Wks Prn 6)  Bd Integra Syringe 25g X 1" 3 Ml Misc (Syringe/needle (Disp)) .... For B12 Shots 7)  Savella 50 Mg Tabs (Milnacipran Hcl) .Marland Kitchen.. 1 By Mouth Bid 8)  Align  Caps (Probiotic Product) .Marland Kitchen.. 1 By Mouth Qd 9)  Hyzaar 100-25 Mg Tabs (Losartan Potassium-Hctz) .Marland Kitchen.. 1 By Mouth Qd 10)  Diflucan 150 Mg Tabs (Fluconazole) .Marland Kitchen.. 1 By Mouth Once Daily Once For Yeast Infection 11)  Cobal-1000 1000 Mcg/ml Soln (Cyanocobalamin) .Marland Kitchen.. 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  The patient denies abdominal pain.         LBP  Physical Exam  General:  NAD overweight-appearing.   Ears:  clear fluid B TMs Mouth:   no oropharyngeal exudate, no pharyngeal erythema.   Neck:  no carotid bruit or thyromegaly .No nodes Lungs:  CTA Heart:  RRR Abdomen:  Colostomy, NT Msk:  Lumbar-sacral spine is tender to palpation over paraspinal muscles and painfull with the ROM, B knees are sensitive to palpation R>L Extremities:  No edema Neurologic:  No cranial nerve deficits noted. Station and gait are normal. Plantar reflexes are down-going bilaterally. DTRs are symmetrical throughout. Sensory, motor and coordinative functions appear intact. Skin:  Clear Psych:  Oriented X3 subdued.     Impression & Recommendations:  Problem # 1:  FIBROMYALGIA (ICD-729.1) Assessment Unchanged  Her updated medication list for this problem includes:    Oxycodone Hcl 15 Mg Tabs (Oxycodone hcl) .Marland KitchenMarland KitchenMarland KitchenMarland Kitchen  1 by mouth three to four  times a day as needed pain. please,  fill on or after 05/04/10   . ok to fill 1-2 day earlier when the month has 31 days in it.  Problem # 2:  FATIGUE (ICD-780.79) Assessment: Unchanged  Problem # 3:  OBESITY, MORBID (ICD-278.01) Assessment: Deteriorated  On the regimen of medicine(s) reflected in the chart    Problem # 4:  INSOMNIA, PERSISTENT (ICD-307.42) Assessment: Unchanged On the regimen of medicine(s) reflected in the chart    Problem # 5:  LOW BACK PAIN (ICD-724.2) Assessment: Unchanged  Her updated medication list for this problem includes:    Oxycodone Hcl 15 Mg Tabs (Oxycodone hcl) .Marland Kitchen... 1 by mouth three to four  times a day as needed  pain. please,  fill on or after 05/04/10   . ok to fill 1-2 day earlier when the month has 31 days in it.  Problem # 6:  ANXIETY (ICD-300.00) Assessment: Unchanged  Problem # 7:  B12 DEFICIENCY (ICD-266.2)  Complete Medication List: 1)  Oxycodone Hcl 15 Mg Tabs (Oxycodone hcl) .Marland Kitchen.. 1 by mouth three to four  times a day as needed pain. please,  fill on or after 05/04/10   . ok to fill 1-2 day earlier when the month has 31 days in it. 2)  Phentermine Hcl 37.5 Mg Tabs (Phentermine hcl) .Marland Kitchen.. 1 by mouth qam 3)  Synthroid 25 Mcg Tabs (Levothyroxine sodium) .Marland Kitchen.. 1 by mouth once daily 4)  Vitamin D3 1000 Unit Tabs (Cholecalciferol) .... 3 by mouth once daily 5)  Lotrisone 1-0.05 % Crea (Clotrimazole-betamethasone) .... Use two times a day x 4 wks prn 6)  Bd Integra Syringe 25g X 1" 3 Ml Misc (Syringe/needle (disp)) .... For b12 shots 7)  Savella 50 Mg Tabs (Milnacipran hcl) .Marland Kitchen.. 1 by mouth bid 8)  Align Caps (Probiotic product) .Marland Kitchen.. 1 by mouth qd 9)  Hyzaar 100-25 Mg Tabs (Losartan potassium-hctz) .Marland Kitchen.. 1 by mouth qd 10)  Diflucan 150 Mg Tabs (Fluconazole) .Marland Kitchen.. 1 by mouth once daily once for yeast infection 11)  Cobal-1000 1000 Mcg/ml Soln (Cyanocobalamin) .Marland Kitchen.. 1 ml im or subcutaneously q 8 wks  Patient Instructions: 1)  Please schedule a follow-up appointment in 3 months well w/labs. Prescriptions: PHENTERMINE HCL 37.5 MG TABS (PHENTERMINE HCL) 1 by mouth qam  #30 x 1   Entered and Authorized by:   Cassandria Anger MD   Signed by:   Cassandria Anger MD on 02/27/2010   Method used:   Print then Give to Patient   RxID:   [id number] OXYCODONE HCL 15 MG TABS (OXYCODONE HCL) 1 by mouth three to four  times a day as needed pain. Please,  fill on or after 05/04/10   . OK to fill 1-2 day earlier when the month has 31 days in it.  #120 x 0   Entered and Authorized by:   Cassandria Anger MD   Signed by:   Cassandria Anger MD on 02/27/2010   Method used:   Print then Give to Patient    RxID:   [id number] OXYCODONE HCL 15 MG TABS (OXYCODONE HCL) 1 by mouth three to four  times a day as needed pain. Please,  fill on or after 04/03/10   . OK to fill 1-2 day earlier when the month has 31 days in it.  #120 x 0   Entered and Authorized by:   Cassandria Anger MD   Signed by:  Evie Lacks Plotnikov MD on 02/27/2010   Method used:   Print then Give to Patient   RxID:   [id number] OXYCODONE HCL 15 MG TABS (OXYCODONE HCL) 1 by mouth three to four  times a day as needed pain. Please,  fill on or after 03/04/10   . OK to fill 1-2 day earlier when the month has 31 days in it.  #120 x 0   Entered and Authorized by:   Cassandria Anger MD   Signed by:   Cassandria Anger MD on 02/27/2010   Method used:   Print then Give to Patient   RxID:   [id number]    Orders Added: 1)  Est. Patient Level IV [82800]

## 2010-05-20 NOTE — Progress Notes (Signed)
Summary: Avalide alternative  Phone Note From Pharmacy   Caller: Patricia--[facility] [phone] Call For: [phone]  Summary of Call: Avalide is back ordered with no release date, Please advise of alternative. Initial call taken by: Ernestene Mention,  May 29, 2009 11:43 AM  Follow-up for Phone Call        OK Benicar hct Follow-up by: Cassandria Anger MD,  May 29, 2009 4:51 PM  Additional Follow-up for Phone Call Additional follow up Details #1::        Phoned to [place]. Called patient to notify--no answer/no machine Additional Follow-up by: Ernestene Mention,  May 29, 2009 4:54 PM    Additional Follow-up for Phone Call Additional follow up Details #2::    Pt's # busy.............................Marland KitchenCharlsie Quest, CMA  May 30, 2009 10:20 AM   Patient is requesting alternative med that is made by [facility] [employer]. She is an employee and would not have to pay for the med. Please advise. ..........................Marland KitchenCharlsie Quest, CMA  May 30, 2009 5:11 PM   Additional Follow-up for Phone Call Additional follow up Details #3:: Details for Additional Follow-up Action Taken: I'm not aware of others Take generic Hyzaar Additional Follow-up by: Cassandria Anger MD,  May 31, 2009 8:01 AM  New/Updated Medications: BENICAR HCT 40-12.5 MG TABS (OLMESARTAN MEDOXOMIL-HCTZ) 1 by mouth qam for blood pressure HYZAAR 100-25 MG TABS (LOSARTAN POTASSIUM-HCTZ) 1 by mouth qd Prescriptions: HYZAAR 100-25 MG TABS (LOSARTAN POTASSIUM-HCTZ) 1 by mouth qd  #90 x 3   Entered by:   Charlsie Quest, CMA   Authorized by:   Cassandria Anger MD   Signed by:   Charlsie Quest, CMA on 05/31/2009   Method used:   Faxed to ...       [facility] (mail[facility]order)       [place]  [address]       Ph: [phone]       Fax: [phone]   [place]:   [id number] Vass HCT 40-12.5 MG TABS (OLMESARTAN MEDOXOMIL-HCTZ) 1 by mouth qam for blood pressure  #90 x  3   Entered by:   Ernestene Mention   Authorized by:   Cassandria Anger MD   Signed by:   Ernestene Mention on 05/29/2009   Method used:   Telephoned to ...       [facility] (mail-order)       [place]  [address]       Ph: [phone]       Fax: [phone]   RxID:   [id number] Band[place] HCT 40-12.5 MG TABS (OLMESARTAN MEDOXOMIL-HCTZ) 1 by mouth qam for blood pressure  #30 x 12   Entered by:   Ernestene Mention   Authorized by:   Cassandria Anger MD   Signed by:   Ernestene Mention on 05/29/2009   Method used:   Telephoned to ...       [facility] (mail-order)       [place]  [address]       Ph: [phone]       Fax: [phone]   RxID:   [id number]  Pt informed, she will call [facility] and find out which med of the 2 above is cheapest. ..............sarah douglas cma 05/31/09 9:00am

## 2010-05-20 NOTE — Assessment & Plan Note (Signed)
Summary: 2 MO ROV /NWS  #   Vital Signs:  Patient profile:   55 year old female Weight:      263 pounds BMI:     43.92 Temp:     98.3 degrees F oral Pulse rate:   94 / minute BP sitting:   118 / 80  (left arm)  Vitals Entered By: Doralee Albino (June 18, 2009 11:02 AM) CC: f/u Is Patient Diabetic? No   CC:  f/u.  History of Present Illness: The patient presents for a follow up of back pain, anxiety, depression and headaches. C/o more pains all over and knees are bad now  Preventive Screening-Counseling & Management  Alcohol-Tobacco     Smoking Status: never  Current Medications (verified): 1)  Percocet 10-650 Mg  Tabs (Oxycodone-Acetaminophen) .Marland Kitchen.. 1 By Mouth Up To Qid As Needed Pain. Please,  Fill On or After    05/16/09     . Ok To Fill 1 Day Earlier When The Month Has 31 Days in It. 2)  Phentermine Hcl 37.5 Mg Tabs (Phentermine Hcl) .Marland Kitchen.. 1 By Mouth Qam 3)  Aciphex 20 Mg Tbec (Rabeprazole Sodium) .Marland Kitchen.. 1 Qam 4)  Synthroid 25 Mcg  Tabs (Levothyroxine Sodium) .Marland Kitchen.. 1 By Mouth Once Daily 5)  Neuro B-12 S 1000 Mcg/ml Inj Soln (Cyanocobalamin) .... Injection Monthly 6)  Vitamin D3 1000 Unit  Tabs (Cholecalciferol) .... 3 By Mouth Once Daily 7)  Lotrisone 1-0.05 % Crea (Clotrimazole-Betamethasone) .... Use Two Times A Day X 4 Wks Prn 8)  Bd Integra Syringe 25g X 1" 3 Ml Misc (Syringe/needle (Disp)) .... For B12 Shots 9)  Savella 50 Mg Tabs (Milnacipran Hcl) .Marland Kitchen.. 1 By Mouth Bid 10)  Align  Caps (Probiotic Product) .Marland Kitchen.. 1 By Mouth Qd 11)  Hyzaar 100-25 Mg Tabs (Losartan Potassium-Hctz) .Marland Kitchen.. 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  The patient denies weight loss, weight gain, dyspnea on exertion, and abdominal pain.    Physical Exam  General:  NAD overweight-appearing.   Ears:  clear fluid B TMs Mouth:   no oropharyngeal exudate, no pharyngeal erythema.   Neck:  no carotid bruit or thyromegaly .nodes Lungs:  CTA Heart:  RRR Abdomen:  Colostomy, NT Msk:  Lumbar-sacral spine is tender to palpation over paraspinal muscles and painfull with the ROM, B knees are sensitive to palpation  Neurologic:  No cranial nerve deficits noted. Station and gait are normal. Plantar reflexes are down-going bilaterally. DTRs are symmetrical throughout. Sensory, motor and coordinative functions appear intact. Skin:  Clear Psych:  Oriented X3 subdued.     Impression & Recommendations:  Problem # 1:  FIBROMYALGIA (ICD-729.1) Assessment Deteriorated  The following medications were removed from the medication list:    Percocet 10-650 Mg Tabs (Oxycodone-acetaminophen) .Marland Kitchen... 1 by mouth up to qid as needed pain. please,  fill on or after    05/16/09     . ok to fill 1 day earlier when the month has 31 days in  it. Her updated medication list for this problem includes:    Oxycodone Hcl 15 Mg Tabs (Oxycodone hcl) .Marland Kitchen... 1 by mouth two times a day as needed pain. please,  fill on or after 07/19/09   . ok to fill 1-2 day earlier when the month has 31 days in it.  Problem # 2:  LOW BACK PAIN (ICD-724.2) Assessment: Unchanged  The following medications were removed from the medication list:    Percocet 10-650 Mg Tabs (Oxycodone-acetaminophen) .Marland Kitchen... 1 by mouth up to qid as needed pain. please,  fill on or after    05/16/09     . ok to fill 1 day earlier when the month has 31 days in  it. Her updated medication list for this problem includes:    Oxycodone Hcl 15 Mg Tabs (Oxycodone hcl) .Marland Kitchen... 1 by mouth two times a day as needed pain. please,  fill on or after 07/19/09   . ok to fill 1-2 day earlier when the month has 31 days in it.  Problem # 3:  PANIC ATTACK (ICD-300.01) Assessment: Comment Only  The following medications were removed from the medication list:    Alprazolam 0.25 Mg Tabs (Alprazolam) .Marland Kitchen... 1 two times a day prn  Problem # 4:  B12 DEFICIENCY (ICD-266.2) Assessment: Comment Only  On prescription therapy   Orders: Vit B12 1000 mcg (J3420) Admin of Therapeutic Inj  intramuscular or subcutaneous (84132)  Problem # 5:  KNEE PAIN (ICD-719.46) B Assessment: Deteriorated  The following medications were removed from the medication list:    Percocet 10-650 Mg Tabs (Oxycodone-acetaminophen) .Marland Kitchen... 1 by mouth up to qid as needed pain. please,  fill on or after    05/16/09     . ok to fill 1 day earlier when the month has 31 days in it. Her updated medication list for this problem includes:    Oxycodone Hcl 15 Mg Tabs (Oxycodone hcl) .Marland Kitchen... 1 by mouth two times a day as needed pain. please,  fill on or after 07/19/09   . ok to fill 1-2 day earlier when the month has 31 days in it.  Complete Medication List: 1)  Phentermine Hcl 37.5 Mg Tabs (Phentermine hcl) .Marland Kitchen.. 1 by mouth qam 2)  Aciphex 20 Mg Tbec (Rabeprazole sodium) .Marland Kitchen.. 1 qam 3)  Synthroid 25 Mcg Tabs (Levothyroxine sodium) .Marland Kitchen.. 1 by mouth once daily 4)  Neuro B-12 S 1000 Mcg/ml Inj Soln (Cyanocobalamin) .... Q 6 wks 5)  Vitamin D3 1000 Unit Tabs (Cholecalciferol) .... 3 by mouth once daily 6)  Lotrisone 1-0.05 % Crea (Clotrimazole-betamethasone) .... Use two times a day x 4 wks prn 7)  Bd Integra Syringe 25g X 1" 3 Ml Misc (Syringe/needle (disp)) .... For b12 shots 8)  Savella 50 Mg Tabs (Milnacipran hcl) .Marland Kitchen.. 1 by mouth bid 9)  Align Caps (Probiotic product) .Marland Kitchen.. 1 by mouth qd 10)  Hyzaar 100-25 Mg Tabs  (Losartan potassium-hctz) .Marland Kitchen.. 1 by mouth qd 11)  Oxycodone Hcl 15 Mg Tabs (Oxycodone hcl) .Marland Kitchen.. 1 by mouth two times a day as needed pain. please,  fill on or after 07/19/09   . ok to fill 1-2 day earlier when the month has 31 days in it.  Patient Instructions: 1)  Please schedule a follow-up appointment in 2 months. Prescriptions: OXYCODONE HCL 15 MG TABS (OXYCODONE HCL) 1 by mouth two times a day as needed pain. Please,  fill on or after 07/19/09   . OK to fill 1-2 day earlier when  the month has 31 days in it.  #120 x 0   Entered and Authorized by:   Cassandria Anger MD   Signed by:   Cassandria Anger MD on 06/18/2009   Method used:   Print then Give to Patient   RxID:   [id number] OXYCODONE HCL 15 MG TABS (OXYCODONE HCL) 1 by mouth two times a day as needed pain  #60 x 0   Entered and Authorized by:   Cassandria Anger MD   Signed by:   Cassandria Anger MD on 06/18/2009   Method used:   Print then Give to Patient   RxID:   [id number] SAVELLA 50 MG TABS (MILNACIPRAN HCL) 1 by mouth bid  #60 x 6   Entered and Authorized by:   Cassandria Anger MD   Signed by:   Cassandria Anger MD on 06/18/2009   Method used:   Print then Give to Patient   RxID:   [id number] PHENTERMINE HCL 37.5 MG TABS (PHENTERMINE HCL) 1 by mouth qam  #30 x 1   Entered and Authorized by:   Cassandria Anger MD   Signed by:   Cassandria Anger MD on 06/18/2009   Method used:   Print then Give to Patient   RxID:   [id number]    Medication Administration  Injection # 1:    Medication: Vit B12 1000 mcg    Diagnosis: B12 DEFICIENCY (ICD-266.2)    Route: IM    Site: L deltoid    Exp Date: 02/2011    Lot #: [id number]    Mfr: [employer] Regent    Comments: 1088mg given    Patient tolerated injection without complications    Given by: VDoralee Albino(June 18, 2009 11:40 AM)  Orders Added: 1)  Vit B12 1000 mcg [J3420] 2)  Admin of Therapeutic Inj  intramuscular or subcutaneous  [96372] 3)  Est. Patient Level IV [[47158]

## 2010-05-20 NOTE — Progress Notes (Signed)
Summary: Yeast infection?  Phone Note Call from Patient Call back at [facility] Phone [phone]   Summary of Call: Patient is requesting rx for yeast infection. C/o vaginal itching and light yellow discharge.  Initial call taken by: Charlsie Quest, [place],  October 25, 2009 2:37 PM  Follow-up for Phone Call        ok Difluc Follow-up by: Cassandria Anger MD,  October 25, 2009 6:07 PM  Additional Follow-up for Phone Call Additional follow up Details #1::        Pt informed  Additional Follow-up by: Charlsie Quest, CMA,  October 25, 2009 6:29 PM    New/Updated Medications: DIFLUCAN 150 MG TABS (FLUCONAZOLE) 1 by mouth once daily once for yeast infection Prescriptions: DIFLUCAN 150 MG TABS (FLUCONAZOLE) 1 by mouth once daily once for yeast infection  #1 x 1   Entered and Authorized by:   Cassandria Anger MD   Signed by:   Charlsie Quest, CMA on 10/25/2009   Method used:   Electronically to        [facility] [phone]* (retail)       [place], [place]  [id number]       Ph: [phone] or [phone]       Fax: [phone]   RxID:   [id number]

## 2010-05-27 ENCOUNTER — Other Ambulatory Visit: Payer: Self-pay | Admitting: Internal Medicine

## 2010-05-27 ENCOUNTER — Encounter (INDEPENDENT_AMBULATORY_CARE_PROVIDER_SITE_OTHER): Payer: Self-pay | Admitting: *Deleted

## 2010-05-27 ENCOUNTER — Other Ambulatory Visit: Payer: Medicare Other

## 2010-05-27 DIAGNOSIS — Z0000 Encounter for general adult medical examination without abnormal findings: Secondary | ICD-10-CM

## 2010-05-27 LAB — BASIC METABOLIC PANEL
CO2: 25 mEq/L (ref 19–32)
Calcium: 9.5 mg/dL (ref 8.4–10.5)
Chloride: 103 mEq/L (ref 96–112)
Glucose, Bld: 87 mg/dL (ref 70–99)
Sodium: 137 mEq/L (ref 135–145)

## 2010-05-27 LAB — HEPATIC FUNCTION PANEL
Albumin: 3.9 g/dL (ref 3.5–5.2)
Total Protein: 7.4 g/dL (ref 6.0–8.3)

## 2010-05-27 LAB — LIPID PANEL
HDL: 46.3 mg/dL (ref >39.00)
Triglycerides: 100 mg/dL (ref 0.0–149.0)

## 2010-05-27 LAB — CBC WITH DIFFERENTIAL/PLATELET
Basophils Absolute: 0.1 10*3/uL (ref 0.0–0.1)
Eosinophils Absolute: 0.1 10*3/uL (ref 0.0–0.7)
Hemoglobin: 11.1 g/dL — ABNORMAL LOW (ref 12.0–15.0)
Lymphocytes Relative: 34.3 % (ref 12.0–46.0)
Lymphs Abs: 2.6 10*3/uL (ref 0.7–4.0)
MCHC: 34.3 g/dL (ref 30.0–36.0)
MCV: 87.8 fl (ref 78.0–100.0)
Monocytes Absolute: 0.3 10*3/uL (ref 0.1–1.0)
Neutro Abs: 4.4 10*3/uL (ref 1.4–7.7)
RDW: 15.4 % — ABNORMAL HIGH (ref 11.5–14.6)

## 2010-05-27 LAB — URINALYSIS
Hgb urine dipstick: NEGATIVE
Leukocytes, UA: NEGATIVE
Specific Gravity, Urine: 1.025 (ref 1.000–1.030)
Urobilinogen, UA: 0.2 (ref 0.0–1.0)

## 2010-05-27 LAB — TSH: TSH: 0.47 u[IU]/mL (ref 0.35–5.50)

## 2010-06-10 ENCOUNTER — Telehealth: Payer: Self-pay | Admitting: Internal Medicine

## 2010-06-10 ENCOUNTER — Encounter: Payer: Self-pay | Admitting: Internal Medicine

## 2010-06-10 ENCOUNTER — Ambulatory Visit (INDEPENDENT_AMBULATORY_CARE_PROVIDER_SITE_OTHER): Payer: Medicare Other | Admitting: Internal Medicine

## 2010-06-10 DIAGNOSIS — IMO0001 Reserved for inherently not codable concepts without codable children: Secondary | ICD-10-CM

## 2010-06-10 DIAGNOSIS — Z0000 Encounter for general adult medical examination without abnormal findings: Secondary | ICD-10-CM

## 2010-06-10 DIAGNOSIS — F410 Panic disorder [episodic paroxysmal anxiety] without agoraphobia: Secondary | ICD-10-CM

## 2010-06-10 DIAGNOSIS — E669 Obesity, unspecified: Secondary | ICD-10-CM

## 2010-06-17 NOTE — Progress Notes (Signed)
Summary: RX CORRECTION  Phone Note From Pharmacy   Summary of Call: Pharm called questioning oxycodone rx. Per pharm MD voided rx to be filled on 1/15. She is filling the 2/15 today and then next will be due  3/20.  Will update EMR med list Initial call taken by: Charlsie Quest, [place],  June 10, 2010 12:48 PM  Follow-up for Phone Call        Thank you!  Follow-up by: Cassandria Anger MD,  June 10, 2010 9:25 PM    New/Updated Medications: OXYCODONE HCL 15 MG TABS (OXYCODONE HCL) 1 by mouth three to four  times a day as needed pain. Please,  fill on or after 07/09/10   . OK to fill 1-2 day earlier when the month has 31 days in it.

## 2010-06-19 ENCOUNTER — Telehealth: Payer: Self-pay | Admitting: Internal Medicine

## 2010-06-26 NOTE — Assessment & Plan Note (Signed)
Summary: 3 MO ROV  /[facility]   Vital Signs:  Patient profile:   55 year old female Height:      65 inches Weight:      265 pounds BMI:     44.26 Temp:     98.4 degrees F oral Pulse rate:   84 / minute Pulse rhythm:   regular Resp:     16 per minute BP sitting:   108 / 68  (left arm) Cuff size:   large  Vitals Entered By: Jonathon Resides, CMA(AAMA) (June 10, 2010 11:26 AM) CC: 3 mo f/u  c/o LBp and Rt leg pain Is Patient Diabetic? No Comments pt needs Rf on Oxycodone and Phentermine   CC:  3 mo f/u  c/o LBp and Rt leg pain.  History of Present Illness: The patient presents for a follow up of FMS, back pain, anxiety, depression. C/o R leg and R knee pain - worse  w/activity The patient presents for a preventive health examination  Patient past medical history, social history, and family history reviewed in detail no significant changes.  Patient is physically active. Depression is negative and mood is good. Hearing is normal, and able to perform activities of daily living. Risk of falling is negligible and home safety has been reviewed and is appropriate. Patient has normal height, she is overweight, and visual acuity is ok. Patient has been counseled on age-appropriate routine health concerns for screening and prevention. Education, counseling done. Cognition is nl  Preventive Screening-Counseling & Management  Alcohol-Tobacco     Alcohol drinks/day: 0     Smoking Status: quit > 6 months     Passive Smoke Exposure: no  Caffeine-Diet-Exercise     Caffeine Counseling: not indicated; caffeine use is not excessive or problematic     Does Patient Exercise: yes     Exercise (avg: min/session): <30     Times/week: <3     Exercise Counseling: to improve exercise regimen  Hep-HIV-STD-Contraception     Hepatitis Risk: no risk noted     Sun Exposure-Excessive: no  Safety-Violence-Falls     Seat Belt Use: yes     Violence in the Home: no risk noted     Sexual Abuse: no     Fall  Risk Counseling: not indicated; no significant falls noted      Sexual History:  currently monogamous.    Current Medications (verified): 1)  Oxycodone Hcl 15 Mg Tabs (Oxycodone Hcl) .Marland Kitchen.. 1 By Mouth Three To Four  Times A Day As Needed Pain. Please,  Fill On or After 05/04/10   . Ok To Fill 1-2 Day Earlier When The Month Has 31 Days in It. 2)  Phentermine Hcl 37.5 Mg Tabs (Phentermine Hcl) .Marland Kitchen.. 1 By Mouth Qam 3)  Synthroid 25 Mcg  Tabs (Levothyroxine Sodium) .Marland Kitchen.. 1 By Mouth Once Daily 4)  Vitamin D3 1000 Unit  Tabs (Cholecalciferol) .... 3 By Mouth Once Daily 5)  Lotrisone 1-0.05 % Crea (Clotrimazole-Betamethasone) .... Use Two Times A Day X 4 Wks Prn 6)  Bd Integra Syringe 25g X 1" 3 Ml Misc (Syringe/needle (Disp)) .... For B12 Shots 7)  Savella 50 Mg Tabs (Milnacipran Hcl) .Marland Kitchen.. 1 By Mouth Bid 8)  Align  Caps (Probiotic Product) .Marland Kitchen.. 1 By Mouth Qd 9)  Hyzaar 100-25 Mg Tabs (Losartan Potassium-Hctz) .Marland Kitchen.. 1 By Mouth Qd 10)  Diflucan 150 Mg Tabs (Fluconazole) .Marland Kitchen.. 1 By Mouth Once Daily Once For Yeast Infection 11)  Cobal-1000 1000 Mcg/ml Soln (Cyanocobalamin) .Marland KitchenMarland KitchenMarland Kitchen  1 Ml Im or Subcutaneously Q 8 Wks  Allergies (verified): 1)  Adderall (Amphetamine-Dextroamphetamine) 2)  Erythromycin Ethylsuccinate (Erythromycin Ethylsuccinate) 3)  Morphine  Past History:  Past Medical History: Last updated: 06/18/2009 Depression Hypertension CHRONIC PERIANAL POST-OP PAIN PELVIC/PERINEAL PAIN 625.9  Crohn's Dr Sharlett Iles Anxiety Low back pain, FMS GERD Osteoarthritis B knees Dr Alvan Dame  Past Surgical History: Last updated: 02/17/2007 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  The patient denies fever, chest pain, abdominal pain, syncope, dyspnea on exertion, and unusual weight change.    Physical Exam  General:  NAD overweight-appearing.   Nose:  Erythematous throat mucosa and intranasal erythema.  Mouth:   no oropharyngeal exudate, no pharyngeal erythema.   Neck:  no carotid bruit or thyromegaly .No nodes Lungs:  CTA Heart:  RRR Abdomen:  Colostomy, NT Msk:  Lumbar-sacral spine is tender to palpation over paraspinal muscles and painfull with the ROM, B knees are sensitive to palpation R>L Extremities:  No edema Neurologic:  No cranial nerve deficits noted. Station and gait are normal. Plantar reflexes are down-going bilaterally. DTRs are symmetrical throughout. Sensory, motor and coordinative functions appear intact. Skin:  Clear Psych:  Oriented X3 subdued.  not suicidal and depressed affect.     Impression & Recommendations:  Problem # 1:  FIBROMYALGIA (ICD-729.1) Assessment Unchanged  Her updated medication list for this problem includes:    Oxycodone Hcl 15 Mg Tabs (Oxycodone hcl) .Marland Kitchen... 1 by mouth three to four  times a day as needed pain. please,  fill on or after 07/09/10   . ok to fill 1-2 day earlier when the month has 31 days in it.  Problem # 2:  ANXIETY (ICD-300.00) Assessment: Unchanged On the regimen of medicine(s) reflected in the chart    Problem # 3:  OBESITY, MORBID (ICD-278.01) Assessment: Improved On Rx She will look into wt watchers  Problem # 4:  GERD (ICD-530.81) Assessment: Comment Only  Problem # 5:  FATIGUE (ICD-780.79) Assessment: Unchanged  Problem # 6:  HEALTH MAINTENANCE EXAM (ICD-V70.0) Assessment: New  Mammo to sch The labs were reviewed with the patient.  PAP w/her GYN The labs were reviewed with the patient.  I have personally reviewed  the Medicare Annual Wellness questionnaire and have noted 1.   The patient's medical and social history 2.   Their use of alcohol, tobacco or illicit drugs 3.   Their current medications and supplements 4.   The patient's functional ability including ADL's, fall risks, home safety risks and hearing or visual             impairment. 5.   Diet and physical activities 6.   Evidence for depression or mood disorders  The patients weight, height, BMI and visual acuity have been recorded in the chart I have made referrals, counseling and provided education to the patient based review of the above and I have provided the pt with a written personalized care plan for preventive services.    Orders: Medicare -1st Annual Wellness Visit [phone])  Complete Medication  List: 1)  Oxycodone Hcl 15 Mg Tabs (Oxycodone hcl) .Marland Kitchen.. 1 by mouth three to four  times a day as needed pain. please,  fill on or after 07/09/10   . ok to fill 1-2 day earlier when the month has 31 days in it. 2)  Phentermine Hcl 37.5 Mg Tabs (Phentermine hcl) .Marland Kitchen.. 1 by mouth qam 3)  Synthroid 25 Mcg Tabs (Levothyroxine sodium) .Marland Kitchen.. 1 by mouth once daily 4)  Vitamin D3 1000 Unit Tabs (Cholecalciferol) .... 3 by mouth once daily 5)  Lotrisone 1-0.05 % Crea (Clotrimazole-betamethasone) .... Use two times a day x 4 wks prn 6)  Bd Integra Syringe 25g X 1" 3 Ml Misc (Syringe/needle (disp)) .... For b12 shots 7)  Savella 50 Mg Tabs (Milnacipran hcl) .Marland Kitchen.. 1 by mouth bid 8)  Align Caps (Probiotic product) .Marland Kitchen.. 1 by mouth qd 9)  Hyzaar 100-25 Mg Tabs (Losartan potassium-hctz) .Marland Kitchen.. 1 by mouth qd 10)  Diflucan 150 Mg Tabs (Fluconazole) .Marland Kitchen.. 1 by mouth once daily once for yeast infection 11)  Cobal-1000 1000 Mcg/ml Soln (Cyanocobalamin) .Marland Kitchen.. 1 ml im or subcutaneously q 8 wks  Patient Instructions: 1)  Please schedule a follow-up appointment in 2 months. 2)  BMP prior to visit, ICD-9: 3)  Vit B12 266.20 4)  Vit D 268.90 Prescriptions: OXYCODONE HCL  15 MG TABS (OXYCODONE HCL) 1 by mouth three to four  times a day as needed pain. Please,  fill on or after 07/03/10   . OK to fill 1-2 day earlier when the month has 31 days in it.  #120 x 0   Entered and Authorized by:   Cassandria Anger MD   Signed by:   Cassandria Anger MD on 06/10/2010   Method used:   Print then Give to Patient   RxID:   [id number] OXYCODONE HCL 15 MG TABS (OXYCODONE HCL) 1 by mouth three to four  times a day as needed pain. Please,  fill on or after 06/04/10   . OK to fill 1-2 day earlier when the month has 31 days in it.  #120 x 0   Entered and Authorized by:   Cassandria Anger MD   Signed by:   Cassandria Anger MD on 06/10/2010   Method used:   Print then Give to Patient   RxID:   [id number] OXYCODONE HCL 15 MG TABS (OXYCODONE HCL) 1 by mouth three to four  times a day as needed pain. Please,  fill on or after 05/04/10   . OK to fill 1-2 day earlier when the month has 31 days in it.  #120 x 0   Entered and Authorized by:   Cassandria Anger MD   Signed by:   Cassandria Anger MD on 06/10/2010   Method used:   Print then Give to Patient   RxID:   [id number] PHENTERMINE HCL 37.5 MG TABS (PHENTERMINE HCL) 1 by mouth qam  #30 x 1   Entered and Authorized by:   Cassandria Anger MD   Signed by:   Cassandria Anger MD on 06/10/2010   Method used:   Print then Give to Patient   RxID:   [id number]    Orders Added: 1)  Medicare -1st Annual Wellness Visit [W6568] 2)  Est. Patient Level IV [12751]

## 2010-06-26 NOTE — Progress Notes (Signed)
Summary: REQ FOR RX  Phone Note Call from Patient   Summary of Call: Patient is requesting rx. C/o vertigo which is causing nausea & vomiting.   [facility] [address] Initial call taken by: Charlsie Quest, [place],  June 19, 2010 9:52 AM  Follow-up for Phone Call        ok meclizine OV if sick Follow-up by: Cassandria Anger MD,  June 19, 2010 5:32 PM  Additional Follow-up for Phone Call Additional follow up Details #1::        Pt informed  Additional Follow-up by: Charlsie Quest, CMA,  June 19, 2010 5:42 PM    New/Updated Medications: MECLIZINE HCL 12.5 MG TABS (MECLIZINE HCL) 1-2 by mouth qid as needed dizziness Prescriptions: MECLIZINE HCL 12.5 MG TABS (MECLIZINE HCL) 1-2 by mouth qid as needed dizziness  #60 x 1   Entered and Authorized by:   Cassandria Anger MD   Signed by:   Charlsie Quest, CMA on 06/19/2010   Method used:   Electronically to        [facility] [phone]* (retail)       [place], [place]  [id number]       Ph: [phone] or [phone]       Fax: [phone]   RxID:   [id number]

## 2010-06-30 LAB — URINALYSIS, ROUTINE W REFLEX MICROSCOPIC
Nitrite: NEGATIVE
Specific Gravity, Urine: 1.016 (ref 1.005–1.030)
Urobilinogen, UA: 0.2 mg/dL (ref 0.0–1.0)

## 2010-06-30 LAB — COMPREHENSIVE METABOLIC PANEL
Alkaline Phosphatase: 78 U/L (ref 39–117)
BUN: 77 mg/dL — ABNORMAL HIGH (ref 6–23)
Calcium: 9.7 mg/dL (ref 8.4–10.5)
Glucose, Bld: 106 mg/dL — ABNORMAL HIGH (ref 70–99)
Potassium: 4.1 mEq/L (ref 3.5–5.1)
Total Protein: 8.2 g/dL (ref 6.0–8.3)

## 2010-06-30 LAB — CBC
HCT: 37.1 % (ref 36.0–46.0)
MCHC: 33.7 g/dL (ref 30.0–36.0)
MCV: 87.5 fL (ref 78.0–100.0)
RDW: 14.9 % (ref 11.5–15.5)

## 2010-06-30 LAB — DIFFERENTIAL
Basophils Absolute: 0.1 10*3/uL (ref 0.0–0.1)
Basophils Relative: 0 % (ref 0–1)
Monocytes Relative: 6 % (ref 3–12)
Neutro Abs: 7.3 10*3/uL (ref 1.7–7.7)
Neutrophils Relative %: 63 % (ref 43–77)

## 2010-06-30 LAB — GC/CHLAMYDIA PROBE AMP, GENITAL
Chlamydia, DNA Probe: NEGATIVE
GC Probe Amp, Genital: NEGATIVE

## 2010-06-30 LAB — URINE MICROSCOPIC-ADD ON

## 2010-06-30 LAB — WET PREP, GENITAL

## 2010-07-31 ENCOUNTER — Telehealth: Payer: Self-pay | Admitting: *Deleted

## 2010-07-31 MED ORDER — OXYCODONE HCL 15 MG PO TABS: 15.0000 mg | ORAL_TABLET | Freq: Four times a day (QID) | ORAL | Status: DC | PRN

## 2010-07-31 MED ORDER — OLMESARTAN MEDOXOMIL-HCTZ 40-12.5 MG PO TABS: 1.0000 | ORAL_TABLET | Freq: Every day | ORAL | Status: DC

## 2010-07-31 NOTE — Telephone Encounter (Signed)
OK to fill both prescriptions with additional refills on Benicar HCT x11 Thank you!

## 2010-07-31 NOTE — Telephone Encounter (Signed)
1. Patient requesting refill of oxycodone 21m qid #120 last filled 07/09/10. (Next ov 4/24)   2. Req rx for Benicar HCT 40/12.5 mg one qd. Per EMR med was removed b/c of cost reasons but pt states she is (and always has been) taking this med and will be out soon.

## 2010-07-31 NOTE — Telephone Encounter (Signed)
Pt informed Dr. Alain Marion is out of office this afternoon and her Rx will be ready to p/u tom 08-01-10

## 2010-08-12 ENCOUNTER — Other Ambulatory Visit (INDEPENDENT_AMBULATORY_CARE_PROVIDER_SITE_OTHER): Payer: Medicare Other

## 2010-08-12 ENCOUNTER — Ambulatory Visit (INDEPENDENT_AMBULATORY_CARE_PROVIDER_SITE_OTHER): Payer: Medicare Other | Admitting: Internal Medicine

## 2010-08-12 ENCOUNTER — Encounter: Payer: Self-pay | Admitting: Internal Medicine

## 2010-08-12 DIAGNOSIS — E669 Obesity, unspecified: Secondary | ICD-10-CM

## 2010-08-12 DIAGNOSIS — E538 Deficiency of other specified B group vitamins: Secondary | ICD-10-CM

## 2010-08-12 DIAGNOSIS — F329 Major depressive disorder, single episode, unspecified: Secondary | ICD-10-CM

## 2010-08-12 DIAGNOSIS — F411 Generalized anxiety disorder: Secondary | ICD-10-CM

## 2010-08-12 DIAGNOSIS — I10 Essential (primary) hypertension: Secondary | ICD-10-CM

## 2010-08-12 LAB — COMPREHENSIVE METABOLIC PANEL
Albumin: 4 g/dL (ref 3.5–5.2)
BUN: 31 mg/dL — ABNORMAL HIGH (ref 6–23)
CO2: 26 mEq/L (ref 19–32)
Calcium: 9.4 mg/dL (ref 8.4–10.5)
Chloride: 103 mEq/L (ref 96–112)
GFR: 58.33 mL/min — ABNORMAL LOW (ref >60.00)
Glucose, Bld: 73 mg/dL (ref 70–99)
Potassium: 3.9 mEq/L (ref 3.5–5.1)

## 2010-08-12 MED ORDER — PHENTERMINE HCL 37.5 MG PO TABS: 37.5000 mg | ORAL_TABLET | Freq: Every day | ORAL | Status: DC

## 2010-08-12 MED ORDER — OXYCODONE HCL 15 MG PO TABS: 15.0000 mg | ORAL_TABLET | Freq: Four times a day (QID) | ORAL | Status: DC | PRN

## 2010-08-12 NOTE — Assessment & Plan Note (Signed)
On rx

## 2010-08-12 NOTE — Progress Notes (Signed)
  Subjective:    Patient ID: Emily Sanchez, female    DOB: February 17, 1956, 55 y.o.   MRN: [id number]  HPI   The patient is here to follow up on chronic HTN, depression, anxiety, headaches and chronic moderate LBP and fibromyalgia symptoms not controlled well with medicines, diet and exercise. C/o ongoing dizziness with head turns.   Review of Systems     Objective:   Physical Exam    Review of Systems  Constitutional: Negative for chills, diaphoresis, appetite change and fatigue. Negative for unexpected weight change.  HENT: Negative for congestion, facial swelling and neck pain.  Eyes: Negative for visual disturbance.  Respiratory: Negative for cough, shortness of breath and wheezing.  Cardiovascular: Negative for leg swelling.  Gastrointestinal: Negative for nausea, vomiting, abdominal pain and abdominal distention. Negative for diarrhea, constipation and anal bleeding.  Genitourinary: Negative for flank pain.  Musculoskeletal: Negative for myalgias, joint swelling and gait problem. LBP Skin: Negative for rash. Negative for pallor and wound.  Psychiatric/Behavioral: Negative for dysphoric mood. Negative for suicidal ideas, behavioral problems, confusion and decreased concentration. The patient is sad, nervous/anxious.     Objective:    Physical Exam  Constitutional:The patient is oriented to person, place, and time. He appears well-developed.  Obese HENT:  Mouth/Throat: Oropharynx is clear and moist.  Eyes: Conjunctivae are normal. Pupils are equal, round, and reactive to light.  Neck: Normal range of motion. No JVD present. No thyromegaly present.  Cardiovascular: Normal rate, regular rhythm, normal heart sounds and intact distal pulses. Exam reveals no gallop and no friction rub.  No murmur heard.  Pulmonary/Chest: Effort normal and breath sounds normal. No respiratory distress. He has no wheezes. He has no rales. He exhibits no tenderness.  Abdominal: Soft. Bowel sounds are  normal. He exhibits no distension and no mass. There is no tenderness. There is no rebound and no guarding. Colostomy bag. Musculoskeletal: Normal range of motion. He exhibits no edema and no tenderness. LS is tender Lymphadenopathy:  He has no cervical adenopathy.  Neurological: He is alert and oriented to person, place, and time. He has normal reflexes. No cranial nerve deficit. He exhibits normal muscle tone. Coordination normal.  Skin: Skin is warm and dry. No rash noted.  Psychiatric: He has a normal mood and affect. His behavior is normal. Judgment and thought content normal.   Lab Results  Component Value Date   WBC 7.5 05/27/2010   HGB 11.1* 05/27/2010   HCT 32.5* 05/27/2010   PLT 252.0 05/27/2010   CHOL 172 05/27/2010   TRIG 100.0 05/27/2010   HDL 46.30 05/27/2010   ALT 12 05/27/2010   AST 16 05/27/2010   NA 137 05/27/2010   K 4.3 05/27/2010   CL 103 05/27/2010   CREATININE 1.1 05/27/2010   BUN 27* 05/27/2010   CO2 25 05/27/2010   TSH 0.47 05/27/2010   HGBA1C 5.9 12/17/2009           Assessment & Plan:  B12 DEFICIENCY On inj q 3 mo Check B 12 today  ANXIETY On rx  OBESITY Wt Readings from Last 3 Encounters:  08/12/10 270 lb (122.471 kg)  06/10/10 265 lb (120.203 kg)  02/27/10 271 lb (122.925 kg)  Diet discussed   HYPERTENSION BP Readings from Last 3 Encounters:  08/12/10 110/70  06/10/10 108/68  02/27/10 118/76  On rx   DEPRESSION On Rx    Vertigo  ENT cons. Is offered

## 2010-08-12 NOTE — Assessment & Plan Note (Signed)
BP Readings from Last 3 Encounters:  08/12/10 110/70  06/10/10 108/68  02/27/10 118/76  On rx

## 2010-08-12 NOTE — Assessment & Plan Note (Signed)
On inj q 3 mo Check B 12 today

## 2010-08-12 NOTE — Assessment & Plan Note (Signed)
Wt Readings from Last 3 Encounters:  08/12/10 270 lb (122.471 kg)  06/10/10 265 lb (120.203 kg)  02/27/10 271 lb (122.925 kg)  Diet discussed

## 2010-08-12 NOTE — Assessment & Plan Note (Signed)
On Rx 

## 2010-09-05 NOTE — Op Note (Signed)
TNAMEJakiera, Sanchez                         ACCOUNT NO.:  [id number]   MEDICAL RECORD NO.:  [id number]                    PATIENT TYPE:   LOCATION:                                       FACILITY:   PHYSICIAN:  Timothy E. Rosana Hoes, M.D.              DATE OF BIRTH:   DATE OF PROCEDURE:  DATE OF DISCHARGE:                                 OPERATIVE REPORT   PREOPERATIVE DIAGNOSES:  Status postop abdominal perineal resection with  total abdominal colectomy for Crohn's/ulcerative colitis, perineal abscess.   POSTOPERATIVE DIAGNOSES:  Status postop abdominal perineal resection with  total abdominal colectomy for Crohn's/ulcerative colitis, perineal abscess.   PROCEDURE:  Examination under anesthesia, fistulogram with fluoroscopy and  debridement and drainage of a perineal abscess.   SURGEON:  Timothy E. Rosana Hoes, M.D.   ANESTHESIA:  General.   INDICATIONS FOR PROCEDURE:  [name] is 26 and has been treated for  ulcerative colitis/Crohn's colitis for the past five years. 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 She has not been seen in a couple of  years either at [facility] or here but presented in my office last month with a  draining perineal wound with a foul drainage from a dimple where the anus  used to be. A CT scan was done that showed some fullness in the prerectal  space. She does have a ventral hernia but otherwise unremarkable. Small  bowel series was normal. She and I carefully discussed this and she wishes  to proceed with examination under anesthesia.   She was evaluated by anesthesia, identified, and permit signed in the preop  area. She was taken to the operating room, placed supine, general  endotracheal anesthesia administered. She was placed in lithotomy, carefully  positioned, the perineum prepped and draped. Careful visual examination of  the perineum, the introitus, the vagina  and perineal area revealed a deep  dimple where the anus used to be. It did probe with a blunt probe 2 or 3 cm.  Careful examination of the vagina showed no evidence of vaginal fistula. The  perineal dimple was further probed with gentle blunt probing and probed for  an additional 6 cm. Then using full strength Hypaque dye with the C-arm  carefully positioned, dye was injected under moderate pressure and this  filled an abscess cavity with one extension off to the patient's right. It  did not show any connection to the bowel, bladder or vagina.   Then using curettes, this cavity was gently debrided. The scrapings were  saved and placed in formalin for examination. Also cultures were made. I  debrided with moderate pressure until the surfaces felt smooth. I tried to  do a lateral view of the fistula tract and was unable to do so because of  the thickness of her pelvis. We did another AP  shot of the pelvis with dye  injected. The cavity was about the same perhaps looks less loculated and  again contained no extension, extravasation, or connection with the bowel,  bladder or vagina.   I then placed a 7 mm drain, sewed it to the outside skin and the procedure  was complete. By our agreement and at her wish, no further incision was made  in the perineum or enlargement of the skin opening.   Written and verbal instructions were given to the patient including Vicodin  for pain and Keflex until we get the culture reports back.                                               Timothy E. Rosana Hoes, M.D.    TED/MEDQ  D:  12/09/2001  T:  12/11/2001  Job:  [id number]   cc:   Evie Lacks. Plotnikov, M.D. [facility]

## 2010-10-15 ENCOUNTER — Encounter: Payer: Self-pay | Admitting: Internal Medicine

## 2010-10-15 ENCOUNTER — Ambulatory Visit (INDEPENDENT_AMBULATORY_CARE_PROVIDER_SITE_OTHER): Payer: Medicare Other | Admitting: Internal Medicine

## 2010-10-15 DIAGNOSIS — I10 Essential (primary) hypertension: Secondary | ICD-10-CM

## 2010-10-15 DIAGNOSIS — E538 Deficiency of other specified B group vitamins: Secondary | ICD-10-CM

## 2010-10-15 DIAGNOSIS — R5383 Other fatigue: Secondary | ICD-10-CM

## 2010-10-15 DIAGNOSIS — K5090 Crohn's disease, unspecified, without complications: Secondary | ICD-10-CM

## 2010-10-15 DIAGNOSIS — IMO0001 Reserved for inherently not codable concepts without codable children: Secondary | ICD-10-CM

## 2010-10-15 DIAGNOSIS — F4321 Adjustment disorder with depressed mood: Secondary | ICD-10-CM

## 2010-10-15 DIAGNOSIS — R5381 Other malaise: Secondary | ICD-10-CM

## 2010-10-15 MED ORDER — LEVOTHYROXINE SODIUM 25 MCG PO TABS: 25.0000 ug | ORAL_TABLET | Freq: Every day | ORAL | Status: DC

## 2010-10-15 MED ORDER — OXYCODONE HCL 15 MG PO TABS: 15.0000 mg | ORAL_TABLET | Freq: Four times a day (QID) | ORAL | Status: DC | PRN

## 2010-10-15 MED ORDER — COBAL-1000 1000 MCG/ML IJ SOLN: 1000.0000 ug | INTRAMUSCULAR | Status: DC

## 2010-10-15 MED ORDER — CYANOCOBALAMIN 1000 MCG/ML IJ SOLN
1000.0000 ug | Freq: Once | INTRAMUSCULAR | Status: AC
  Administered 2010-10-15: 1000 ug via INTRAMUSCULAR

## 2010-10-15 NOTE — Assessment & Plan Note (Signed)
On Rx 

## 2010-10-15 NOTE — Progress Notes (Signed)
  Subjective:    Patient ID: Emily Sanchez, female    DOB: 30-Nov-1955, 55 y.o.   MRN: [id number]  HPI   The patient is here to follow up on chronic depression, anxiety, headaches and chronic moderate LBP and fibromyalgia symptoms controlled with medicines. Not doing too well today  Review of Systems  Constitutional: Positive for fatigue and unexpected weight change (wt gain). Negative for chills, activity change and appetite change.  HENT: Negative for congestion, mouth sores and sinus pressure.   Eyes: Negative for visual disturbance.  Respiratory: Negative for cough and chest tightness.   Gastrointestinal: Negative for nausea and abdominal pain.  Genitourinary: Negative for frequency, difficulty urinating and vaginal pain.  Musculoskeletal: Positive for back pain, arthralgias and gait problem.  Skin: Negative for pallor and rash.  Neurological: Negative for dizziness, tremors, weakness, numbness and headaches.  Psychiatric/Behavioral: Positive for dysphoric mood. Negative for suicidal ideas, confusion and sleep disturbance. The patient is nervous/anxious.        Objective:   Physical Exam  Constitutional: She appears well-developed. No distress.       Obese  HENT:  Head: Normocephalic.  Right Ear: External ear normal.  Left Ear: External ear normal.  Nose: Nose normal.  Mouth/Throat: Oropharynx is clear and moist.  Eyes: Conjunctivae are normal. Pupils are equal, round, and reactive to light. Right eye exhibits no discharge. Left eye exhibits no discharge.  Neck: Normal range of motion. Neck supple. No JVD present. No tracheal deviation present. No thyromegaly present.  Cardiovascular: Normal rate, regular rhythm and normal heart sounds.   Pulmonary/Chest: No stridor. No respiratory distress. She has no wheezes.  Abdominal: Soft. Bowel sounds are normal. She exhibits no distension and no mass. There is no tenderness. There is no rebound and no guarding.       Ostomy bag    Musculoskeletal: She exhibits tenderness. She exhibits no edema.  Lymphadenopathy:    She has no cervical adenopathy.  Neurological: She displays normal reflexes. No cranial nerve deficit. She exhibits normal muscle tone. Coordination normal.  Skin: No rash noted. No erythema.  Psychiatric: Her behavior is normal. Judgment and thought content normal.       Sad          Assessment & Plan:

## 2010-10-15 NOTE — Assessment & Plan Note (Signed)
Discussed On Rx

## 2010-10-15 NOTE — Assessment & Plan Note (Signed)
Wt Readings from Last 3 Encounters:  10/15/10 270 lb (122.471 kg)  08/12/10 270 lb (122.471 kg)  06/10/10 265 lb (120.203 kg)

## 2010-12-26 ENCOUNTER — Ambulatory Visit: Payer: Medicare Other | Admitting: Internal Medicine

## 2011-01-06 ENCOUNTER — Encounter: Payer: Self-pay | Admitting: Internal Medicine

## 2011-01-06 ENCOUNTER — Ambulatory Visit (INDEPENDENT_AMBULATORY_CARE_PROVIDER_SITE_OTHER): Payer: Medicare Other | Admitting: Internal Medicine

## 2011-01-06 VITALS — BP 122/78 | HR 76 | Temp 98.5°F | Resp 16 | Wt 278.0 lb

## 2011-01-06 DIAGNOSIS — Z23 Encounter for immunization: Secondary | ICD-10-CM

## 2011-01-06 DIAGNOSIS — M545 Low back pain: Secondary | ICD-10-CM

## 2011-01-06 DIAGNOSIS — IMO0001 Reserved for inherently not codable concepts without codable children: Secondary | ICD-10-CM

## 2011-01-06 DIAGNOSIS — E538 Deficiency of other specified B group vitamins: Secondary | ICD-10-CM

## 2011-01-06 MED ORDER — "SYRINGE/NEEDLE (DISP) 23G X 1"" 3 ML MISC": 1.0000 | Status: DC

## 2011-01-06 MED ORDER — OXYCODONE HCL 15 MG PO TABS: 15.0000 mg | ORAL_TABLET | Freq: Four times a day (QID) | ORAL | Status: DC | PRN

## 2011-01-06 MED ORDER — LEVOTHYROXINE SODIUM 25 MCG PO TABS: 25.0000 ug | ORAL_TABLET | Freq: Every day | ORAL | Status: DC

## 2011-01-06 MED ORDER — PHENTERMINE HCL 37.5 MG PO TABS: 37.5000 mg | ORAL_TABLET | Freq: Every day | ORAL | Status: DC

## 2011-01-06 MED ORDER — MECLIZINE HCL 12.5 MG PO TABS: 12.5000 mg | ORAL_TABLET | Freq: Four times a day (QID) | ORAL | Status: DC | PRN

## 2011-01-06 MED ORDER — COBAL-1000 1000 MCG/ML IJ SOLN: 1000.0000 ug | INTRAMUSCULAR | Status: DC

## 2011-01-06 MED ORDER — CYANOCOBALAMIN 1000 MCG/ML IJ SOLN
1000.0000 ug | Freq: Once | INTRAMUSCULAR | Status: AC
  Administered 2011-01-06: 1000 ug via INTRAMUSCULAR

## 2011-01-06 NOTE — Assessment & Plan Note (Signed)
Continue with current prescription therapy as reflected on the Med list.  

## 2011-01-06 NOTE — Assessment & Plan Note (Signed)
Wt Readings from Last 3 Encounters:  01/06/11 278 lb (126.1 kg)  10/15/10 270 lb (122.471 kg)  08/12/10 270 lb (122.471 kg)

## 2011-01-12 ENCOUNTER — Encounter: Payer: Self-pay | Admitting: Internal Medicine

## 2011-01-12 NOTE — Progress Notes (Signed)
  Subjective:    Patient ID: Emily Sanchez, female    DOB: 29-Oct-1955, 55 y.o.   MRN: [id number]  HPI  The patient is here to follow up on chronic depression, LBP, anxiety, headaches and chronic moderate fibromyalgia symptoms partially controlled with medicines, diet and minimal exercise.    Review of Systems  Constitutional: Negative for chills, activity change, appetite change, fatigue and unexpected weight change.  HENT: Negative for congestion, mouth sores and sinus pressure.   Eyes: Negative for visual disturbance.  Respiratory: Negative for cough and chest tightness.   Gastrointestinal: Negative for nausea and abdominal pain.  Genitourinary: Negative for frequency, difficulty urinating and vaginal pain.  Musculoskeletal: Positive for myalgias, back pain and arthralgias. Negative for gait problem.  Skin: Negative for pallor and rash.  Neurological: Negative for dizziness, tremors, weakness, numbness and headaches.  Psychiatric/Behavioral: Positive for sleep disturbance. Negative for confusion. The patient is nervous/anxious.        Objective:   Physical Exam  Constitutional: She appears well-developed. No distress.       obese  HENT:  Head: Normocephalic.  Right Ear: External ear normal.  Left Ear: External ear normal.  Nose: Nose normal.  Mouth/Throat: Oropharynx is clear and moist.  Eyes: Conjunctivae are normal. Pupils are equal, round, and reactive to light. Right eye exhibits no discharge. Left eye exhibits no discharge.  Neck: Normal range of motion. Neck supple. No JVD present. No tracheal deviation present. No thyromegaly present.  Cardiovascular: Normal rate, regular rhythm and normal heart sounds.   Pulmonary/Chest: No stridor. No respiratory distress. She has no wheezes.  Abdominal: Soft. Bowel sounds are normal. She exhibits no distension and no mass. There is no tenderness. There is no rebound and no guarding.  Musculoskeletal: She exhibits tenderness (ls is  tender). She exhibits no edema.  Lymphadenopathy:    She has no cervical adenopathy.  Neurological: She displays normal reflexes. No cranial nerve deficit. She exhibits normal muscle tone. Coordination normal.  Skin: No rash noted. No erythema.  Psychiatric: She has a normal mood and affect. Her behavior is normal. Judgment and thought content normal.          Assessment & Plan:

## 2011-02-04 ENCOUNTER — Telehealth: Payer: Self-pay | Admitting: *Deleted

## 2011-02-04 NOTE — Telephone Encounter (Signed)
Caller requesting status of ostomy orders. I called [facility] and advised rep that Dr. Alain Marion is out of office until Monday. Spoke to [place]- he states that a letter of medical necessity is needed for supplies as well as physician's order. I advised him we will work on this and return to them upon Dr. Judeen Hammans return to office.

## 2011-02-08 ENCOUNTER — Emergency Department (HOSPITAL_COMMUNITY)
Admission: EM | Admit: 2011-02-08 | Discharge: 2011-02-09 | Disposition: A | Discharge status: Home / Self Care | Payer: Medicare Other | Attending: Emergency Medicine | Admitting: Emergency Medicine

## 2011-02-08 DIAGNOSIS — M129 Arthropathy, unspecified: Secondary | ICD-10-CM | POA: Insufficient documentation

## 2011-02-08 DIAGNOSIS — IMO0001 Reserved for inherently not codable concepts without codable children: Secondary | ICD-10-CM | POA: Insufficient documentation

## 2011-02-08 DIAGNOSIS — N289 Disorder of kidney and ureter, unspecified: Secondary | ICD-10-CM | POA: Insufficient documentation

## 2011-02-08 DIAGNOSIS — Z933 Colostomy status: Secondary | ICD-10-CM | POA: Insufficient documentation

## 2011-02-08 DIAGNOSIS — I10 Essential (primary) hypertension: Secondary | ICD-10-CM | POA: Insufficient documentation

## 2011-02-08 DIAGNOSIS — K5090 Crohn's disease, unspecified, without complications: Secondary | ICD-10-CM | POA: Insufficient documentation

## 2011-02-08 LAB — DIFFERENTIAL
Basophils Absolute: 0.1 10*3/uL (ref 0.0–0.1)
Basophils Relative: 1 % (ref 0–1)
Lymphocytes Relative: 43 % (ref 12–46)
Monocytes Absolute: 0.6 10*3/uL (ref 0.1–1.0)
Neutro Abs: 4.1 10*3/uL (ref 1.7–7.7)
Neutrophils Relative %: 46 % (ref 43–77)

## 2011-02-08 LAB — COMPREHENSIVE METABOLIC PANEL
ALT: 12 U/L (ref 0–35)
Alkaline Phosphatase: 105 U/L (ref 39–117)
BUN: 36 mg/dL — ABNORMAL HIGH (ref 6–23)
Chloride: 98 mEq/L (ref 96–112)
GFR calc Af Amer: 31 mL/min — ABNORMAL LOW (ref >90)
Glucose, Bld: 86 mg/dL (ref 70–99)
Potassium: 4.3 mEq/L (ref 3.5–5.1)
Sodium: 134 mEq/L — ABNORMAL LOW (ref 135–145)
Total Bilirubin: 0.2 mg/dL — ABNORMAL LOW (ref 0.3–1.2)
Total Protein: 8.1 g/dL (ref 6.0–8.3)

## 2011-02-08 LAB — POCT I-STAT, CHEM 8
Chloride: 105 mEq/L (ref 96–112)
Creatinine, Ser: 1.8 mg/dL — ABNORMAL HIGH (ref 0.50–1.10)
Glucose, Bld: 82 mg/dL (ref 70–99)
Potassium: 4 mEq/L (ref 3.5–5.1)

## 2011-02-08 LAB — URINALYSIS, ROUTINE W REFLEX MICROSCOPIC
Glucose, UA: NEGATIVE mg/dL
Protein, ur: NEGATIVE mg/dL
Urobilinogen, UA: 0.2 mg/dL (ref 0.0–1.0)

## 2011-02-08 LAB — CBC
HCT: 35.8 % — ABNORMAL LOW (ref 36.0–46.0)
Hemoglobin: 11.4 g/dL — ABNORMAL LOW (ref 12.0–15.0)
MCHC: 31.8 g/dL (ref 30.0–36.0)
RBC: 4.1 MIL/uL (ref 3.87–5.11)
WBC: 8.9 10*3/uL (ref 4.0–10.5)

## 2011-02-08 LAB — URINE MICROSCOPIC-ADD ON

## 2011-02-09 LAB — URINALYSIS, ROUTINE W REFLEX MICROSCOPIC
Bilirubin Urine: NEGATIVE
Glucose, UA: NEGATIVE mg/dL
Hgb urine dipstick: NEGATIVE
Specific Gravity, Urine: 1.013 (ref 1.005–1.030)
pH: 5 (ref 5.0–8.0)

## 2011-02-09 LAB — URINE MICROSCOPIC-ADD ON

## 2011-02-09 NOTE — Telephone Encounter (Signed)
Forms given to Dr. Alain Marion on 02-09-11 for completion.

## 2011-02-10 LAB — URINE CULTURE
Colony Count: 40000
Culture  Setup Time: 201210220900

## 2011-02-12 ENCOUNTER — Telehealth: Payer: Self-pay | Admitting: *Deleted

## 2011-02-12 NOTE — Telephone Encounter (Signed)
Not sure. Thx

## 2011-02-12 NOTE — Telephone Encounter (Signed)
Medicare requires LMN for Colostomy supplies. Please advise why pt needs to use more than medicare allows.   fax letter to [phone]

## 2011-02-13 NOTE — Telephone Encounter (Signed)
Please help, need some details so we can generate a letter, Thanks

## 2011-02-16 NOTE — Telephone Encounter (Signed)
Yes, thx AP

## 2011-02-16 NOTE — Telephone Encounter (Signed)
[facility] called left vm stating we can write a letter or Rx stating the pt uses more deodorizer than medicare allows for reasons like ( excessive odor or leakage). Medicare allows 24 oz in 3 mo. Pt is requesting 48 oz...Marland KitchenMarland KitchenOk to state this?

## 2011-02-17 NOTE — Telephone Encounter (Signed)
Letter printed/signed and faxed to below fax number.

## 2011-03-25 ENCOUNTER — Other Ambulatory Visit (INDEPENDENT_AMBULATORY_CARE_PROVIDER_SITE_OTHER): Payer: Medicare Other

## 2011-03-25 DIAGNOSIS — K5090 Crohn's disease, unspecified, without complications: Secondary | ICD-10-CM

## 2011-03-25 DIAGNOSIS — E538 Deficiency of other specified B group vitamins: Secondary | ICD-10-CM

## 2011-03-25 LAB — COMPREHENSIVE METABOLIC PANEL
ALT: 18 U/L (ref 0–35)
AST: 20 U/L (ref 0–37)
Albumin: 3.7 g/dL (ref 3.5–5.2)
Alkaline Phosphatase: 73 U/L (ref 39–117)
Glucose, Bld: 111 mg/dL — ABNORMAL HIGH (ref 70–99)
Potassium: 4.8 mEq/L (ref 3.5–5.1)
Sodium: 141 mEq/L (ref 135–145)
Total Protein: 7.5 g/dL (ref 6.0–8.3)

## 2011-03-25 LAB — CBC WITH DIFFERENTIAL/PLATELET
Basophils Relative: 0.7 % (ref 0.0–3.0)
Eosinophils Absolute: 0.4 10*3/uL (ref 0.0–0.7)
Lymphocytes Relative: 38.7 % (ref 12.0–46.0)
MCHC: 34 g/dL (ref 30.0–36.0)
Neutrophils Relative %: 46.9 % (ref 43.0–77.0)
RBC: 3.72 Mil/uL — ABNORMAL LOW (ref 3.87–5.11)
WBC: 7.9 10*3/uL (ref 4.5–10.5)

## 2011-03-25 LAB — VITAMIN B12: Vitamin B-12: 753 pg/mL (ref 211–911)

## 2011-04-08 ENCOUNTER — Encounter: Payer: Self-pay | Admitting: Internal Medicine

## 2011-04-08 ENCOUNTER — Ambulatory Visit (INDEPENDENT_AMBULATORY_CARE_PROVIDER_SITE_OTHER): Payer: Medicare Other | Admitting: Internal Medicine

## 2011-04-08 VITALS — BP 116/74 | HR 72 | Temp 98.5°F | Resp 16 | Wt 268.0 lb

## 2011-04-08 DIAGNOSIS — F411 Generalized anxiety disorder: Secondary | ICD-10-CM

## 2011-04-08 DIAGNOSIS — D638 Anemia in other chronic diseases classified elsewhere: Secondary | ICD-10-CM | POA: Insufficient documentation

## 2011-04-08 DIAGNOSIS — IMO0001 Reserved for inherently not codable concepts without codable children: Secondary | ICD-10-CM

## 2011-04-08 DIAGNOSIS — E538 Deficiency of other specified B group vitamins: Secondary | ICD-10-CM

## 2011-04-08 DIAGNOSIS — N189 Chronic kidney disease, unspecified: Secondary | ICD-10-CM

## 2011-04-08 DIAGNOSIS — E559 Vitamin D deficiency, unspecified: Secondary | ICD-10-CM

## 2011-04-08 DIAGNOSIS — F410 Panic disorder [episodic paroxysmal anxiety] without agoraphobia: Secondary | ICD-10-CM

## 2011-04-08 DIAGNOSIS — M545 Low back pain: Secondary | ICD-10-CM

## 2011-04-08 DIAGNOSIS — I10 Essential (primary) hypertension: Secondary | ICD-10-CM

## 2011-04-08 MED ORDER — OXYCODONE HCL 15 MG PO TABS: 15.0000 mg | ORAL_TABLET | Freq: Four times a day (QID) | ORAL | Status: DC | PRN

## 2011-04-08 MED ORDER — PHENTERMINE HCL 37.5 MG PO TABS: 37.5000 mg | ORAL_TABLET | Freq: Every day | ORAL | Status: DC

## 2011-04-08 NOTE — Assessment & Plan Note (Signed)
Refractory  Potential benefits of a long term phentermine  use as well as potential risks  and complications were explained to the patient and were aknowledged. Wt Readings from Last 3 Encounters:  04/08/11 268 lb (121.564 kg)  01/06/11 278 lb (126.1 kg)  10/15/10 270 lb (122.471 kg)

## 2011-04-08 NOTE — Assessment & Plan Note (Signed)
Continue with current prescription therapy as reflected on the Med list.  

## 2011-04-08 NOTE — Assessment & Plan Note (Signed)
Watching labs

## 2011-04-08 NOTE — Assessment & Plan Note (Signed)
Watching

## 2011-04-08 NOTE — Assessment & Plan Note (Signed)
Chronic  Potential benefits of a long term benzodiazepines  use as well as potential risks  and complications were explained to the patient and were aknowledged. Continue with current prescription therapy as reflected on the Med list.

## 2011-04-08 NOTE — Progress Notes (Signed)
  Subjective:    Patient ID: Emily Sanchez, female    DOB: May 15, 1955, 55 y.o.   MRN: [id number]  HPI   The patient is here to follow up on chronic depression, anxiety, headaches and chronic moderate fibromyalgia symptoms controlled partially with medicines, diet    Review of Systems  Constitutional: Negative for chills, activity change, appetite change, fatigue and unexpected weight change.  HENT: Negative for congestion, mouth sores and sinus pressure.   Eyes: Negative for visual disturbance.  Respiratory: Negative for cough and chest tightness.   Cardiovascular: Negative for leg swelling.  Gastrointestinal: Positive for abdominal pain and abdominal distention. Negative for nausea.  Genitourinary: Negative for frequency, difficulty urinating and vaginal pain.  Musculoskeletal: Positive for back pain and arthralgias. Negative for gait problem.  Skin: Negative for pallor and rash.  Neurological: Negative for dizziness, tremors, weakness, numbness and headaches.  Psychiatric/Behavioral: Positive for sleep disturbance. Negative for suicidal ideas and confusion. The patient is nervous/anxious.        Objective:   Physical Exam  Constitutional: She appears well-developed. No distress.  HENT:  Head: Normocephalic.  Right Ear: External ear normal.  Left Ear: External ear normal.  Nose: Nose normal.  Mouth/Throat: Oropharynx is clear and moist.  Eyes: Conjunctivae are normal. Pupils are equal, round, and reactive to light. Right eye exhibits no discharge. Left eye exhibits no discharge.  Neck: Normal range of motion. Neck supple. No JVD present. No tracheal deviation present. No thyromegaly present.  Cardiovascular: Normal rate, regular rhythm and normal heart sounds.   Pulmonary/Chest: No stridor. No respiratory distress. She has no wheezes.  Abdominal: Soft. Bowel sounds are normal. She exhibits no distension and no mass. There is no tenderness. There is no rebound and no guarding.    Musculoskeletal: She exhibits tenderness (LS). She exhibits no edema.  Lymphadenopathy:    She has no cervical adenopathy.  Neurological: She displays normal reflexes. No cranial nerve deficit. She exhibits normal muscle tone. Coordination normal.  Skin: No rash noted. No erythema.  Psychiatric: She has a normal mood and affect. Her behavior is normal. Judgment and thought content normal.   Lab Results  Component Value Date   WBC 7.9 03/25/2011   HGB 10.9* 03/25/2011   HCT 32.1* 03/25/2011   PLT 244.0 03/25/2011   GLUCOSE 111* 03/25/2011   CHOL 172 05/27/2010   TRIG 100.0 05/27/2010   HDL 46.30 05/27/2010   LDLCALC 106* 05/27/2010   ALT 18 03/25/2011   AST 20 03/25/2011   NA 141 03/25/2011   K 4.8 03/25/2011   CL 106 03/25/2011   CREATININE 1.6* 03/25/2011   BUN 30* 03/25/2011   CO2 27 03/25/2011   TSH 0.47 05/27/2010   HGBA1C 5.9 12/17/2009          Assessment & Plan:

## 2011-05-04 ENCOUNTER — Other Ambulatory Visit: Payer: Self-pay | Admitting: *Deleted

## 2011-05-04 MED ORDER — OLMESARTAN MEDOXOMIL-HCTZ 40-12.5 MG PO TABS: 1.0000 | ORAL_TABLET | Freq: Every day | ORAL | Status: DC

## 2011-07-08 ENCOUNTER — Other Ambulatory Visit (INDEPENDENT_AMBULATORY_CARE_PROVIDER_SITE_OTHER): Payer: Medicare Other

## 2011-07-08 DIAGNOSIS — D638 Anemia in other chronic diseases classified elsewhere: Secondary | ICD-10-CM

## 2011-07-08 DIAGNOSIS — F410 Panic disorder [episodic paroxysmal anxiety] without agoraphobia: Secondary | ICD-10-CM | POA: Diagnosis not present

## 2011-07-08 DIAGNOSIS — E559 Vitamin D deficiency, unspecified: Secondary | ICD-10-CM | POA: Diagnosis not present

## 2011-07-08 DIAGNOSIS — M545 Low back pain: Secondary | ICD-10-CM

## 2011-07-08 DIAGNOSIS — F411 Generalized anxiety disorder: Secondary | ICD-10-CM

## 2011-07-08 DIAGNOSIS — I10 Essential (primary) hypertension: Secondary | ICD-10-CM

## 2011-07-08 DIAGNOSIS — N189 Chronic kidney disease, unspecified: Secondary | ICD-10-CM

## 2011-07-08 DIAGNOSIS — IMO0001 Reserved for inherently not codable concepts without codable children: Secondary | ICD-10-CM

## 2011-07-08 DIAGNOSIS — E538 Deficiency of other specified B group vitamins: Secondary | ICD-10-CM

## 2011-07-08 LAB — BASIC METABOLIC PANEL
BUN: 22 mg/dL (ref 6–23)
Calcium: 9.6 mg/dL (ref 8.4–10.5)
Creatinine, Ser: 1.2 mg/dL (ref 0.4–1.2)
GFR: 57.59 mL/min — ABNORMAL LOW (ref >60.00)
Glucose, Bld: 98 mg/dL (ref 70–99)
Sodium: 139 mEq/L (ref 135–145)

## 2011-07-08 LAB — CBC WITH DIFFERENTIAL/PLATELET
Basophils Absolute: 0.1 10*3/uL (ref 0.0–0.1)
Eosinophils Absolute: 0.3 10*3/uL (ref 0.0–0.7)
Lymphocytes Relative: 35 % (ref 12.0–46.0)
MCHC: 32.6 g/dL (ref 30.0–36.0)
Monocytes Relative: 8.2 % (ref 3.0–12.0)
Neutrophils Relative %: 52.2 % (ref 43.0–77.0)
Platelets: 271 10*3/uL (ref 150.0–400.0)
RDW: 14.8 % — ABNORMAL HIGH (ref 11.5–14.6)

## 2011-07-23 ENCOUNTER — Encounter: Payer: Self-pay | Admitting: Internal Medicine

## 2011-07-23 ENCOUNTER — Ambulatory Visit (INDEPENDENT_AMBULATORY_CARE_PROVIDER_SITE_OTHER): Payer: Medicare Other | Admitting: Internal Medicine

## 2011-07-23 VITALS — BP 114/72 | HR 89 | Temp 98.3°F | Ht 65.0 in | Wt 267.1 lb

## 2011-07-23 DIAGNOSIS — F411 Generalized anxiety disorder: Secondary | ICD-10-CM

## 2011-07-23 DIAGNOSIS — K5090 Crohn's disease, unspecified, without complications: Secondary | ICD-10-CM

## 2011-07-23 DIAGNOSIS — E538 Deficiency of other specified B group vitamins: Secondary | ICD-10-CM

## 2011-07-23 DIAGNOSIS — E559 Vitamin D deficiency, unspecified: Secondary | ICD-10-CM

## 2011-07-23 DIAGNOSIS — F329 Major depressive disorder, single episode, unspecified: Secondary | ICD-10-CM | POA: Diagnosis not present

## 2011-07-23 DIAGNOSIS — I10 Essential (primary) hypertension: Secondary | ICD-10-CM | POA: Diagnosis not present

## 2011-07-23 DIAGNOSIS — M545 Low back pain: Secondary | ICD-10-CM | POA: Diagnosis not present

## 2011-07-23 DIAGNOSIS — IMO0001 Reserved for inherently not codable concepts without codable children: Secondary | ICD-10-CM

## 2011-07-23 MED ORDER — LEVOTHYROXINE SODIUM 25 MCG PO TABS: 25.0000 ug | ORAL_TABLET | Freq: Every day | ORAL | Status: DC

## 2011-07-23 MED ORDER — OXYCODONE HCL 15 MG PO TABS: 15.0000 mg | ORAL_TABLET | Freq: Four times a day (QID) | ORAL | Status: DC | PRN

## 2011-07-23 MED ORDER — PHENTERMINE HCL 37.5 MG PO TABS: 37.5000 mg | ORAL_TABLET | Freq: Every day | ORAL | Status: DC

## 2011-07-23 MED ORDER — METHYLPREDNISOLONE ACETATE 80 MG/ML IJ SUSP
120.0000 mg | Freq: Once | INTRAMUSCULAR | Status: AC
  Administered 2011-07-23: 120 mg via INTRAMUSCULAR

## 2011-07-23 NOTE — Assessment & Plan Note (Signed)
Continue with current prescription therapy as reflected on the Med list.  

## 2011-07-23 NOTE — Progress Notes (Signed)
Patient ID: Emily Sanchez, female   DOB: [birth year], 56 y.o.   MRN: [id number]  Subjective:    Patient ID: Emily Sanchez, female    DOB: [birth year], 56 y.o.   MRN: [id number]  Emesis  Associated symptoms include abdominal pain and arthralgias. Pertinent negatives include no chills, coughing, dizziness or headaches.     The patient is here to follow up on chronic depression, anxiety, headaches and chronic moderate fibromyalgia symptoms controlled partially with medicines, diet - FMS were worse C/o GI upset - a virus? - better now  Wt Readings from Last 3 Encounters:  07/23/11 267 lb 1.9 oz (121.165 kg)  04/08/11 268 lb (121.564 kg)  01/06/11 278 lb (126.1 kg)   BP Readings from Last 3 Encounters:  07/23/11 114/72  04/08/11 116/74  01/06/11 122/78       Review of Systems  Constitutional: Negative for chills, activity change, appetite change, fatigue and unexpected weight change.  HENT: Negative for congestion, mouth sores and sinus pressure.   Eyes: Negative for visual disturbance.  Respiratory: Negative for cough and chest tightness.   Cardiovascular: Negative for leg swelling.  Gastrointestinal: Positive for vomiting, abdominal pain and abdominal distention. Negative for nausea.  Genitourinary: Negative for frequency, difficulty urinating and vaginal pain.  Musculoskeletal: Positive for back pain and arthralgias. Negative for gait problem.  Skin: Negative for pallor and rash.  Neurological: Negative for dizziness, tremors, weakness, numbness and headaches.  Psychiatric/Behavioral: Positive for sleep disturbance. Negative for suicidal ideas and confusion. The patient is nervous/anxious.        Objective:   Physical Exam  Constitutional: She appears well-developed. No distress.  HENT:  Head: Normocephalic.  Right Ear: External ear normal.  Left Ear: External ear normal.  Nose: Nose normal.  Mouth/Throat: Oropharynx is clear and moist.  Eyes: Conjunctivae are normal.  Pupils are equal, round, and reactive to light. Right eye exhibits no discharge. Left eye exhibits no discharge.  Neck: Normal range of motion. Neck supple. No JVD present. No tracheal deviation present. No thyromegaly present.  Cardiovascular: Normal rate, regular rhythm and normal heart sounds.   Pulmonary/Chest: No stridor. No respiratory distress. She has no wheezes.  Abdominal: Soft. Bowel sounds are normal. She exhibits no distension and no mass. There is no tenderness. There is no rebound and no guarding.  Musculoskeletal: She exhibits tenderness (LS). She exhibits no edema.  Lymphadenopathy:    She has no cervical adenopathy.  Neurological: She displays normal reflexes. No cranial nerve deficit. She exhibits normal muscle tone. Coordination normal.  Skin: No rash noted. No erythema.  Psychiatric: She has a normal mood and affect. Her behavior is normal. Judgment and thought content normal.   Lab Results  Component Value Date   WBC 7.5 07/08/2011   HGB 11.4* 07/08/2011   HCT 34.9* 07/08/2011   PLT 271.0 07/08/2011   GLUCOSE 98 07/08/2011   CHOL 172 05/27/2010   TRIG 100.0 05/27/2010   HDL 46.30 05/27/2010   LDLCALC 106* 05/27/2010   ALT 18 03/25/2011   AST 20 03/25/2011   NA 139 07/08/2011   K 4.7 07/08/2011   CL 105 07/08/2011   CREATININE 1.2 07/08/2011   BUN 22 07/08/2011   CO2 26 07/08/2011   TSH 0.47 05/27/2010   HGBA1C 5.9 12/17/2009          Assessment & Plan:

## 2011-07-23 NOTE — Assessment & Plan Note (Signed)
Continue with current prescription therapy as reflected on the Med list. Depo 120 mg im

## 2011-07-27 ENCOUNTER — Telehealth: Payer: Self-pay | Admitting: *Deleted

## 2011-07-27 DIAGNOSIS — Z0279 Encounter for issue of other medical certificate: Secondary | ICD-10-CM

## 2011-07-27 NOTE — Telephone Encounter (Signed)
Pt is calling to check status on long term disability papers that she dropped off 2 wks ago. I do not have forms. Hoyle Sauer does not have forms. She is going to call pt to discuss this further.

## 2011-10-13 ENCOUNTER — Telehealth: Payer: Self-pay | Admitting: Internal Medicine

## 2011-10-13 MED ORDER — OXYCODONE HCL 15 MG PO TABS: 15.0000 mg | ORAL_TABLET | Freq: Four times a day (QID) | ORAL | Status: DC | PRN

## 2011-10-13 NOTE — Telephone Encounter (Signed)
Agree w/OV OK to fill Rx early once.  Thx

## 2011-10-13 NOTE — Telephone Encounter (Signed)
Notified pt md ok refill rx is ready for pick-up, also to keep 10/29/11 appt... 10/12/1321:09pm/LMB

## 2011-10-13 NOTE — Telephone Encounter (Signed)
Caller: Emily Sanchez/Patient; PCP: Walker Kehr; CB#: [phone];  Call regarding Fibromyalgia -. Increased Pain onset approx. 10/03/11.  ; Appt. July 11.  Supposed to take pain meds q 6 hours but she has been taking Rx. estimated q 3-4 hours instead , does not have enough to last until then.  Asks if she writes a note if her spouse Emily Sanchez- can pick up Rx for her. She states she is willing to see PCP if appointment available.  OFFICE:  There is currently a  30 min appt. with Dr. Alain Marion on 6/27; advised caller will have to check to see if that can be scheduled and that you will inform her if she can see provider that day or if she can pick up Rx.  Thank you.

## 2011-10-29 ENCOUNTER — Ambulatory Visit (INDEPENDENT_AMBULATORY_CARE_PROVIDER_SITE_OTHER): Payer: Medicare Other | Admitting: Internal Medicine

## 2011-10-29 ENCOUNTER — Encounter: Payer: Self-pay | Admitting: Internal Medicine

## 2011-10-29 VITALS — BP 128/70 | HR 80 | Temp 98.1°F | Resp 16 | Wt 274.0 lb

## 2011-10-29 DIAGNOSIS — IMO0001 Reserved for inherently not codable concepts without codable children: Secondary | ICD-10-CM

## 2011-10-29 DIAGNOSIS — M25569 Pain in unspecified knee: Secondary | ICD-10-CM | POA: Diagnosis not present

## 2011-10-29 DIAGNOSIS — F329 Major depressive disorder, single episode, unspecified: Secondary | ICD-10-CM | POA: Diagnosis not present

## 2011-10-29 DIAGNOSIS — E538 Deficiency of other specified B group vitamins: Secondary | ICD-10-CM | POA: Diagnosis not present

## 2011-10-29 DIAGNOSIS — R5381 Other malaise: Secondary | ICD-10-CM

## 2011-10-29 DIAGNOSIS — I10 Essential (primary) hypertension: Secondary | ICD-10-CM

## 2011-10-29 DIAGNOSIS — M545 Low back pain: Secondary | ICD-10-CM

## 2011-10-29 MED ORDER — METHYLPREDNISOLONE ACETATE 80 MG/ML IJ SUSP: 40.0000 mg | Freq: Once | INTRAMUSCULAR | Status: DC

## 2011-10-29 MED ORDER — OXYCODONE HCL 15 MG PO TABS: 15.0000 mg | ORAL_TABLET | Freq: Four times a day (QID) | ORAL | Status: DC | PRN

## 2011-10-29 MED ORDER — MECLIZINE HCL 12.5 MG PO TABS: 12.5000 mg | ORAL_TABLET | Freq: Four times a day (QID) | ORAL | Status: DC | PRN

## 2011-10-29 MED ORDER — PHENTERMINE HCL 37.5 MG PO TABS: 37.5000 mg | ORAL_TABLET | Freq: Every day | ORAL | Status: DC

## 2011-10-29 NOTE — Assessment & Plan Note (Signed)
CFS

## 2011-10-29 NOTE — Assessment & Plan Note (Signed)
Continue with current prescription therapy as reflected on the Med list.  

## 2011-10-29 NOTE — Progress Notes (Signed)
P  Subjective:    Patient ID: Emily Sanchez, female    DOB: 1956-01-10, 56 y.o.   MRN: [id number]  Abdominal Pain This is a chronic problem. The current episode started more than 1 year ago. The onset quality is undetermined. The problem occurs 2 to 4 times per day. The problem has been unchanged. The pain is located in the LLQ, RLQ, periumbilical region and suprapubic region. The pain is at a severity of 4/10. The pain is moderate. The quality of the pain is colicky. Pertinent negatives include no frequency or nausea. The treatment provided moderate relief. Prior diagnostic workup includes GI consult, surgery, upper endoscopy, lower endoscopy and CT scan. Her past medical history is significant for Crohn's disease.     The patient is here to follow up on chronic depression, anxiety, headaches and chronic moderate fibromyalgia symptoms controlled partially with medicines, diet - FMS were worse C/o R knee pain  Wt Readings from Last 3 Encounters:  10/29/11 274 lb (124.286 kg)  07/23/11 267 lb 1.9 oz (121.165 kg)  04/08/11 268 lb (121.564 kg)   BP Readings from Last 3 Encounters:  10/29/11 128/70  07/23/11 114/72  04/08/11 116/74       Review of Systems  Constitutional: Negative for activity change, appetite change, fatigue and unexpected weight change.  HENT: Negative for congestion, mouth sores and sinus pressure.   Eyes: Negative for visual disturbance.  Respiratory: Negative for chest tightness.   Cardiovascular: Negative for leg swelling.  Gastrointestinal: Positive for abdominal distention. Negative for nausea.  Genitourinary: Negative for frequency, difficulty urinating and vaginal pain.  Musculoskeletal: Positive for back pain. Negative for gait problem.  Skin: Negative for pallor and rash.  Neurological: Negative for tremors, weakness and numbness.  Psychiatric/Behavioral: Positive for disturbed wake/sleep cycle. Negative for suicidal ideas and confusion. The patient is  nervous/anxious.        Objective:   Physical Exam  Constitutional: She appears well-developed. No distress.  HENT:  Head: Normocephalic.  Right Ear: External ear normal.  Left Ear: External ear normal.  Nose: Nose normal.  Mouth/Throat: Oropharynx is clear and moist.  Eyes: Conjunctivae are normal. Pupils are equal, round, and reactive to light. Right eye exhibits no discharge. Left eye exhibits no discharge.  Neck: Normal range of motion. Neck supple. No JVD present. No tracheal deviation present. No thyromegaly present.  Cardiovascular: Normal rate, regular rhythm and normal heart sounds.   Pulmonary/Chest: No stridor. No respiratory distress. She has no wheezes.  Abdominal: Soft. Bowel sounds are normal. She exhibits no distension and no mass. There is no tenderness. There is no rebound and no guarding.  Musculoskeletal: She exhibits tenderness (LS). She exhibits no edema.       R b.anserina is tender  Lymphadenopathy:    She has no cervical adenopathy.  Neurological: She displays normal reflexes. No cranial nerve deficit. She exhibits normal muscle tone. Coordination normal.  Skin: No rash noted. No erythema.  Psychiatric: She has a normal mood and affect. Her behavior is normal. Judgment and thought content normal.   Lab Results  Component Value Date   WBC 7.5 07/08/2011   HGB 11.4* 07/08/2011   HCT 34.9* 07/08/2011   PLT 271.0 07/08/2011   GLUCOSE 98 07/08/2011   CHOL 172 05/27/2010   TRIG 100.0 05/27/2010   HDL 46.30 05/27/2010   LDLCALC 106* 05/27/2010   ALT 18 03/25/2011   AST 20 03/25/2011   NA 139 07/08/2011   K 4.7 07/08/2011   CL  105 07/08/2011   CREATININE 1.2 07/08/2011   BUN 22 07/08/2011   CO2 26 07/08/2011   TSH 0.47 05/27/2010   HGBA1C 5.9 12/17/2009      Procedure Note :    Procedure :Joint Injection,  Knee bursa anserina R   Indication: Bursitis with refractory  chronic pain.   Risks including unsuccessful procedure , bleeding, infection, bruising, skin atrophy  and others were explained to the patient in detail as well as the benefits. Informed consent was obtained and signed.   Tthe patient was placed in a comfortable position. Skin was prepped with Betadine and alcohol  and anesthetized with a cooling spray. Then, a 3 cc syringe with a 1.5 inch long 25-gauge needle was used for a bursa injection in a fan-like fasion with 3 mL of 2% lidocaine and 40 mg of Depo-Medrol .  Band-Aid was applied.   Tolerated well. Complications: None. Good pain relief following the procedure.   Postprocedure instructions :    A Band-Aid should be left on for 12 hours. Injection therapy is not a cure itself. It is used in conjunction with other modalities. You can use nonsteroidal anti-inflammatories like ibuprofen , hot and cold compresses. Rest is recommended in the next 24 hours. You need to report immediately  if fever, chills or any signs of infection develop.      Assessment & Plan:

## 2011-10-29 NOTE — Assessment & Plan Note (Signed)
7/13 R b.anserina bursitis. Rx options discussed She asked to inject

## 2011-10-29 NOTE — Assessment & Plan Note (Signed)
Chronic  Potential benefits of a long term opioids use as well as potential risks (i.e. addiction risk, apnea etc) and complications (i.e. Somnolence, constipation and others) were explained to the patient and were aknowledged.  Worse 4/13. 7/13

## 2011-11-25 DIAGNOSIS — H04129 Dry eye syndrome of unspecified lacrimal gland: Secondary | ICD-10-CM | POA: Diagnosis not present

## 2011-12-22 DIAGNOSIS — M1710 Unilateral primary osteoarthritis, unspecified knee: Secondary | ICD-10-CM | POA: Diagnosis not present

## 2012-01-26 ENCOUNTER — Encounter: Payer: Self-pay | Admitting: Internal Medicine

## 2012-01-26 ENCOUNTER — Ambulatory Visit (INDEPENDENT_AMBULATORY_CARE_PROVIDER_SITE_OTHER): Payer: Medicare Other | Admitting: Internal Medicine

## 2012-01-26 ENCOUNTER — Other Ambulatory Visit (INDEPENDENT_AMBULATORY_CARE_PROVIDER_SITE_OTHER): Payer: Medicare Other

## 2012-01-26 VITALS — BP 102/70 | HR 68 | Temp 98.2°F | Resp 16 | Wt 267.5 lb

## 2012-01-26 DIAGNOSIS — Z23 Encounter for immunization: Secondary | ICD-10-CM | POA: Diagnosis not present

## 2012-01-26 DIAGNOSIS — IMO0001 Reserved for inherently not codable concepts without codable children: Secondary | ICD-10-CM | POA: Diagnosis not present

## 2012-01-26 DIAGNOSIS — E559 Vitamin D deficiency, unspecified: Secondary | ICD-10-CM

## 2012-01-26 DIAGNOSIS — R5381 Other malaise: Secondary | ICD-10-CM

## 2012-01-26 DIAGNOSIS — R5383 Other fatigue: Secondary | ICD-10-CM

## 2012-01-26 DIAGNOSIS — E538 Deficiency of other specified B group vitamins: Secondary | ICD-10-CM

## 2012-01-26 LAB — CBC WITH DIFFERENTIAL/PLATELET
Basophils Relative: 1.3 % (ref 0.0–3.0)
Eosinophils Absolute: 0.4 10*3/uL (ref 0.0–0.7)
Eosinophils Relative: 5.3 % — ABNORMAL HIGH (ref 0.0–5.0)
Hemoglobin: 11.1 g/dL — ABNORMAL LOW (ref 12.0–15.0)
Lymphocytes Relative: 28.5 % (ref 12.0–46.0)
MCHC: 32.2 g/dL (ref 30.0–36.0)
MCV: 86.8 fl (ref 78.0–100.0)
Monocytes Absolute: 0.4 10*3/uL (ref 0.1–1.0)
Neutro Abs: 4.8 10*3/uL (ref 1.4–7.7)
Neutrophils Relative %: 60.1 % (ref 43.0–77.0)
RBC: 3.96 Mil/uL (ref 3.87–5.11)
WBC: 7.9 10*3/uL (ref 4.5–10.5)

## 2012-01-26 LAB — BASIC METABOLIC PANEL
BUN: 31 mg/dL — ABNORMAL HIGH (ref 6–23)
Chloride: 101 mEq/L (ref 96–112)
Creatinine, Ser: 1.5 mg/dL — ABNORMAL HIGH (ref 0.4–1.2)
Glucose, Bld: 91 mg/dL (ref 70–99)
Potassium: 4.3 mEq/L (ref 3.5–5.1)

## 2012-01-26 LAB — TSH: TSH: 0.93 u[IU]/mL (ref 0.35–5.50)

## 2012-01-26 LAB — VITAMIN B12: Vitamin B-12: 849 pg/mL (ref 211–911)

## 2012-01-26 MED ORDER — OXYCODONE HCL 15 MG PO TABS: 15.0000 mg | ORAL_TABLET | Freq: Four times a day (QID) | ORAL | Status: DC | PRN

## 2012-01-26 MED ORDER — PHENTERMINE HCL 37.5 MG PO TABS: 37.5000 mg | ORAL_TABLET | Freq: Every day | ORAL | Status: DC

## 2012-01-26 NOTE — Assessment & Plan Note (Signed)
No change 

## 2012-01-26 NOTE — Assessment & Plan Note (Signed)
Wt Readings from Last 3 Encounters:  01/26/12 267 lb 8 oz (121.337 kg)  10/29/11 274 lb (124.286 kg)  07/23/11 267 lb 1.9 oz (121.165 kg)   Continue with current prescription therapy as reflected on the Med list.

## 2012-01-26 NOTE — Progress Notes (Signed)
P  Subjective:    Patient ID: Emily Sanchez, female    DOB: Sep 02, 1955, 56 y.o.   MRN: [id number]  Abdominal Pain This is a chronic problem. The current episode started more than 1 year ago. The onset quality is undetermined. The problem occurs 2 to 4 times per day. The problem has been unchanged. The pain is located in the LLQ, RLQ, periumbilical region and suprapubic region. The pain is at a severity of 4/10. The pain is moderate. The quality of the pain is colicky. Pertinent negatives include no frequency or nausea. The treatment provided moderate relief. Prior diagnostic workup includes GI consult, surgery, upper endoscopy, lower endoscopy and CT scan. Her past medical history is significant for Crohn's disease.   C/o mouth sores  The patient is here to follow up on chronic depression, anxiety, headaches and chronic moderate fibromyalgia symptoms controlled partially with medicines, diet - FMS were worse C/o R knee pain - not better  Wt Readings from Last 3 Encounters:  01/26/12 267 lb 8 oz (121.337 kg)  10/29/11 274 lb (124.286 kg)  07/23/11 267 lb 1.9 oz (121.165 kg)   BP Readings from Last 3 Encounters:  01/26/12 102/70  10/29/11 128/70  07/23/11 114/72       Review of Systems  Constitutional: Negative for activity change, appetite change, fatigue and unexpected weight change.  HENT: Negative for congestion, mouth sores and sinus pressure.   Eyes: Negative for visual disturbance.  Respiratory: Negative for chest tightness.   Cardiovascular: Negative for leg swelling.  Gastrointestinal: Positive for abdominal pain and abdominal distention. Negative for nausea.  Genitourinary: Negative for frequency, difficulty urinating and vaginal pain.  Musculoskeletal: Positive for back pain. Negative for gait problem.  Skin: Negative for pallor and rash.  Neurological: Negative for tremors, weakness and numbness.  Psychiatric/Behavioral: Positive for disturbed wake/sleep cycle.  Negative for suicidal ideas and confusion. The patient is nervous/anxious.        Objective:   Physical Exam  Constitutional: She appears well-developed. No distress.  HENT:  Head: Normocephalic.  Right Ear: External ear normal.  Left Ear: External ear normal.  Nose: Nose normal.  Mouth/Throat: Oropharynx is clear and moist.  Eyes: Conjunctivae normal are normal. Pupils are equal, round, and reactive to light. Right eye exhibits no discharge. Left eye exhibits no discharge.  Neck: Normal range of motion. Neck supple. No JVD present. No tracheal deviation present. No thyromegaly present.  Cardiovascular: Normal rate, regular rhythm and normal heart sounds.   Pulmonary/Chest: No stridor. No respiratory distress. She has no wheezes.  Abdominal: Soft. Bowel sounds are normal. She exhibits no distension and no mass. There is no tenderness. There is no rebound and no guarding.  Musculoskeletal: She exhibits tenderness (LS). She exhibits no edema.       R b.anserina is tender  Lymphadenopathy:    She has no cervical adenopathy.  Neurological: She displays normal reflexes. No cranial nerve deficit. She exhibits normal muscle tone. Coordination normal.  Skin: No rash noted. No erythema.  Psychiatric: She has a normal mood and affect. Her behavior is normal. Judgment and thought content normal.   Lab Results  Component Value Date   WBC 7.5 07/08/2011   HGB 11.4* 07/08/2011   HCT 34.9* 07/08/2011   PLT 271.0 07/08/2011   GLUCOSE 98 07/08/2011   CHOL 172 05/27/2010   TRIG 100.0 05/27/2010   HDL 46.30 05/27/2010   LDLCALC 106* 05/27/2010   ALT 18 03/25/2011   AST 20 03/25/2011  NA 139 07/08/2011   K 4.7 07/08/2011   CL 105 07/08/2011   CREATININE 1.2 07/08/2011   BUN 22 07/08/2011   CO2 26 07/08/2011   TSH 0.47 05/27/2010   HGBA1C 5.9 12/17/2009       Assessment & Plan:

## 2012-01-26 NOTE — Assessment & Plan Note (Signed)
Continue with current prescription therapy as reflected on the Med list.  

## 2012-01-27 LAB — ALLERGEN FOOD PROFILE SPECIFIC IGE
Apple: <0.1 kU/L
Chicken IgE: <0.1 kU/L
Egg White IgE: <0.1 kU/L
Milk IgE: <0.1 kU/L
Orange: <0.1 kU/L
Shrimp IgE: <0.1 kU/L
Tuna IgE: <0.1 kU/L

## 2012-02-03 DIAGNOSIS — M1710 Unilateral primary osteoarthritis, unspecified knee: Secondary | ICD-10-CM | POA: Diagnosis not present

## 2012-02-18 LAB — IGG FOOD PANEL
Allergen, Milk, IgG: 20 ug/mL — ABNORMAL HIGH (ref <0.15)
Chicken, IgG: <0.15 ug/mL (ref <0.15)
Corn, IgG: <0.15 ug/mL (ref <0.15)
Egg white, IgG: 3

## 2012-03-30 ENCOUNTER — Ambulatory Visit (INDEPENDENT_AMBULATORY_CARE_PROVIDER_SITE_OTHER): Payer: Medicare Other | Admitting: Internal Medicine

## 2012-03-30 ENCOUNTER — Encounter: Payer: Self-pay | Admitting: Internal Medicine

## 2012-03-30 VITALS — BP 90/60 | HR 76 | Temp 98.4°F | Resp 16 | Wt 266.0 lb

## 2012-03-30 DIAGNOSIS — K130 Diseases of lips: Secondary | ICD-10-CM

## 2012-03-30 DIAGNOSIS — M545 Low back pain, unspecified: Secondary | ICD-10-CM

## 2012-03-30 DIAGNOSIS — E538 Deficiency of other specified B group vitamins: Secondary | ICD-10-CM | POA: Diagnosis not present

## 2012-03-30 DIAGNOSIS — K219 Gastro-esophageal reflux disease without esophagitis: Secondary | ICD-10-CM

## 2012-03-30 MED ORDER — MECLIZINE HCL 12.5 MG PO TABS: 12.5000 mg | ORAL_TABLET | Freq: Four times a day (QID) | ORAL | Status: DC | PRN

## 2012-03-30 MED ORDER — CLOTRIMAZOLE-BETAMETHASONE 1-0.05 % EX CREA: TOPICAL_CREAM | Freq: Two times a day (BID) | CUTANEOUS | Status: DC

## 2012-03-30 MED ORDER — OXYCODONE HCL 15 MG PO TABS: 15.0000 mg | ORAL_TABLET | Freq: Four times a day (QID) | ORAL | Status: DC | PRN

## 2012-03-30 MED ORDER — LEVOTHYROXINE SODIUM 25 MCG PO TABS: 25.0000 ug | ORAL_TABLET | Freq: Every day | ORAL | Status: DC

## 2012-03-30 NOTE — Assessment & Plan Note (Signed)
Continue with current prescription therapy as reflected on the Med list.  

## 2012-03-30 NOTE — Assessment & Plan Note (Signed)
Lotrisone prn

## 2012-03-30 NOTE — Progress Notes (Signed)
   Subjective:     HPI  The patient is here to follow up on chronic depression, anxiety, headaches and chronic moderate fibromyalgia symptoms controlled partially with medicines, diet - FMS were worse C/o R knee pain - not better C/o rash in mouth angles at time  Wt Readings from Last 3 Encounters:  03/30/12 266 lb (120.657 kg)  01/26/12 267 lb 8 oz (121.337 kg)  10/29/11 274 lb (124.286 kg)   BP Readings from Last 3 Encounters:  03/30/12 90/60  01/26/12 102/70  10/29/11 128/70       Review of Systems  Constitutional: Negative for activity change, appetite change, fatigue and unexpected weight change.  HENT: Positive for tinnitus (R). Negative for congestion, mouth sores and sinus pressure.   Eyes: Negative for visual disturbance.  Respiratory: Negative for chest tightness.   Cardiovascular: Negative for leg swelling.  Gastrointestinal: Positive for abdominal distention.  Genitourinary: Negative for difficulty urinating and vaginal pain.  Musculoskeletal: Positive for back pain. Negative for gait problem.  Skin: Negative for pallor and rash.  Neurological: Positive for dizziness. Negative for tremors, weakness and numbness.  Psychiatric/Behavioral: Positive for sleep disturbance. Negative for suicidal ideas and confusion. The patient is nervous/anxious.        Objective:   Physical Exam  Constitutional: She appears well-developed. No distress.  HENT:  Head: Normocephalic.  Right Ear: External ear normal.  Left Ear: External ear normal.  Nose: Nose normal.  Mouth/Throat: Oropharynx is clear and moist.  Eyes: Conjunctivae normal are normal. Pupils are equal, round, and reactive to light. Right eye exhibits no discharge. Left eye exhibits no discharge.  Neck: Normal range of motion. Neck supple. No JVD present. No tracheal deviation present. No thyromegaly present.  Cardiovascular: Normal rate, regular rhythm and normal heart sounds.   Pulmonary/Chest: No stridor. No  respiratory distress. She has no wheezes.  Abdominal: Soft. Bowel sounds are normal. She exhibits no distension and no mass. There is no tenderness. There is no rebound and no guarding.  Musculoskeletal: She exhibits tenderness (LS). She exhibits no edema.       R b.anserina is tender  Lymphadenopathy:    She has no cervical adenopathy.  Neurological: She displays normal reflexes. No cranial nerve deficit. She exhibits normal muscle tone. Coordination normal.  Skin: No rash noted. No erythema.  Psychiatric: She has a normal mood and affect. Her behavior is normal. Judgment and thought content normal.  angular stomatitis rash  Lab Results  Component Value Date   WBC 7.9 01/26/2012   HGB 11.1* 01/26/2012   HCT 34.3* 01/26/2012   PLT 284.0 01/26/2012   GLUCOSE 91 01/26/2012   CHOL 172 05/27/2010   TRIG 100.0 05/27/2010   HDL 46.30 05/27/2010   LDLCALC 106* 05/27/2010   ALT 18 03/25/2011   AST 20 03/25/2011   NA 137 01/26/2012   K 4.3 01/26/2012   CL 101 01/26/2012   CREATININE 1.5* 01/26/2012   BUN 31* 01/26/2012   CO2 27 01/26/2012   TSH 0.93 01/26/2012   HGBA1C 5.9 12/17/2009       Assessment & Plan:

## 2012-04-23 ENCOUNTER — Other Ambulatory Visit: Payer: Self-pay | Admitting: Internal Medicine

## 2012-05-31 ENCOUNTER — Encounter: Payer: Self-pay | Admitting: Internal Medicine

## 2012-05-31 ENCOUNTER — Ambulatory Visit (INDEPENDENT_AMBULATORY_CARE_PROVIDER_SITE_OTHER): Payer: Medicare Other | Admitting: Internal Medicine

## 2012-05-31 VITALS — BP 120/62 | HR 76 | Temp 97.8°F | Resp 16 | Wt 260.0 lb

## 2012-05-31 DIAGNOSIS — F411 Generalized anxiety disorder: Secondary | ICD-10-CM | POA: Diagnosis not present

## 2012-05-31 DIAGNOSIS — IMO0001 Reserved for inherently not codable concepts without codable children: Secondary | ICD-10-CM | POA: Diagnosis not present

## 2012-05-31 DIAGNOSIS — I10 Essential (primary) hypertension: Secondary | ICD-10-CM

## 2012-05-31 DIAGNOSIS — E538 Deficiency of other specified B group vitamins: Secondary | ICD-10-CM | POA: Diagnosis not present

## 2012-05-31 DIAGNOSIS — K130 Diseases of lips: Secondary | ICD-10-CM | POA: Diagnosis not present

## 2012-05-31 DIAGNOSIS — R635 Abnormal weight gain: Secondary | ICD-10-CM

## 2012-05-31 MED ORDER — PHENTERMINE HCL 37.5 MG PO TABS: 37.5000 mg | ORAL_TABLET | Freq: Every day | ORAL | Status: DC

## 2012-05-31 MED ORDER — OXYCODONE HCL 15 MG PO TABS: 15.0000 mg | ORAL_TABLET | Freq: Four times a day (QID) | ORAL | Status: DC | PRN

## 2012-05-31 NOTE — Assessment & Plan Note (Signed)
Continue with current prescription therapy as reflected on the Med list.  

## 2012-05-31 NOTE — Assessment & Plan Note (Signed)
Better  Continue with current prescription therapy as reflected on the Med list.

## 2012-05-31 NOTE — Assessment & Plan Note (Signed)
Better  

## 2012-05-31 NOTE — Assessment & Plan Note (Signed)
resolved 

## 2012-05-31 NOTE — Progress Notes (Signed)
Patient ID: Emily Sanchez, female   DOB: 01-May-1955, 57 y.o.   MRN: [id number]   Subjective:     HPI  The patient is here to follow up on chronic depression, anxiety, headaches and chronic moderate fibromyalgia symptoms controlled partially with medicines, diet - FMS were worse F/u R knee pain an LBP- not better C/o rash in mouth angles at time - better  Wt Readings from Last 3 Encounters:  05/31/12 260 lb (117.935 kg)  03/30/12 266 lb (120.657 kg)  01/26/12 267 lb 8 oz (121.337 kg)   BP Readings from Last 3 Encounters:  05/31/12 120/62  03/30/12 90/60  01/26/12 102/70       Review of Systems  Constitutional: Negative for activity change, appetite change, fatigue and unexpected weight change.  HENT: Positive for tinnitus (R). Negative for congestion, mouth sores and sinus pressure.   Eyes: Negative for visual disturbance.  Respiratory: Negative for chest tightness.   Cardiovascular: Negative for leg swelling.  Gastrointestinal: Positive for abdominal distention.  Genitourinary: Negative for difficulty urinating and vaginal pain.  Musculoskeletal: Positive for back pain. Negative for gait problem.  Skin: Negative for pallor and rash.  Neurological: Positive for dizziness. Negative for tremors, weakness and numbness.  Psychiatric/Behavioral: Positive for sleep disturbance. Negative for suicidal ideas and confusion. The patient is nervous/anxious.        Objective:   Physical Exam  Constitutional: She appears well-developed. No distress.  HENT:  Head: Normocephalic.  Right Ear: External ear normal.  Left Ear: External ear normal.  Nose: Nose normal.  Mouth/Throat: Oropharynx is clear and moist.  Eyes: Conjunctivae are normal. Pupils are equal, round, and reactive to light. Right eye exhibits no discharge. Left eye exhibits no discharge.  Neck: Normal range of motion. Neck supple. No JVD present. No tracheal deviation present. No thyromegaly present.  Cardiovascular:  Normal rate, regular rhythm and normal heart sounds.   Pulmonary/Chest: No stridor. No respiratory distress. She has no wheezes.  Abdominal: Soft. Bowel sounds are normal. She exhibits no distension and no mass. There is no tenderness. There is no rebound and no guarding.  Musculoskeletal: She exhibits tenderness (LS). She exhibits no edema.  R b.anserina is tender  Lymphadenopathy:    She has no cervical adenopathy.  Neurological: She displays normal reflexes. No cranial nerve deficit. She exhibits normal muscle tone. Coordination normal.  Skin: No rash noted. No erythema.  Psychiatric: She has a normal mood and affect. Her behavior is normal. Judgment and thought content normal.   Lab Results  Component Value Date   WBC 7.9 01/26/2012   HGB 11.1* 01/26/2012   HCT 34.3* 01/26/2012   PLT 284.0 01/26/2012   GLUCOSE 91 01/26/2012   CHOL 172 05/27/2010   TRIG 100.0 05/27/2010   HDL 46.30 05/27/2010   LDLCALC 106* 05/27/2010   ALT 18 03/25/2011   AST 20 03/25/2011   NA 137 01/26/2012   K 4.3 01/26/2012   CL 101 01/26/2012   CREATININE 1.5* 01/26/2012   BUN 31* 01/26/2012   CO2 27 01/26/2012   TSH 0.93 01/26/2012   HGBA1C 5.9 12/17/2009       Assessment & Plan:

## 2012-06-13 ENCOUNTER — Encounter: Payer: Self-pay | Admitting: Internal Medicine

## 2012-06-13 ENCOUNTER — Telehealth: Payer: Self-pay | Admitting: Internal Medicine

## 2012-06-13 ENCOUNTER — Ambulatory Visit (INDEPENDENT_AMBULATORY_CARE_PROVIDER_SITE_OTHER): Payer: Medicare Other | Admitting: Internal Medicine

## 2012-06-13 VITALS — BP 120/72 | HR 111 | Temp 98.7°F

## 2012-06-13 DIAGNOSIS — J029 Acute pharyngitis, unspecified: Secondary | ICD-10-CM

## 2012-06-13 MED ORDER — AZITHROMYCIN 250 MG PO TABS: ORAL_TABLET | ORAL | Status: DC

## 2012-06-13 MED ORDER — PHENYLEPH-PROMETHAZINE-COD 5-6.25-10 MG/5ML PO SYRP: 5.0000 mL | ORAL_SOLUTION | ORAL | Status: DC | PRN

## 2012-06-13 NOTE — Telephone Encounter (Signed)
Patient Information:  Caller Name: Shawntavia  Phone: [phone]  Patient: Emily Sanchez, Emily Sanchez  Gender: Female  DOB: 19-Nov-1955  Age: 57 Years  PCP: Plotnikov, Alex (Adults only)  Office Follow Up:  Does the office need to follow up with this patient?: No  Instructions For The Office: N/A   Symptoms  Reason For Call & Symptoms: Patient calling, has been sick since 2/19.  Asking for medication.  Has a sore throat, h/a and right ear pain. Has a cough that is non-productive.  Reviewed Health History In EMR: Yes  Reviewed Medications In EMR: Yes  Reviewed Allergies In EMR: Yes  Reviewed Surgeries / Procedures: Yes  Date of Onset of Symptoms: 06/08/2012  Treatments Tried: Tylenol Severe Cold and Flu  Treatments Tried Worked: No  Any Fever: Yes  Fever Taken: Oral  Fever Time Of Reading: 16:30:00  Fever Last Reading: 101  Guideline(s) Used:  Sore Throat  Disposition Per Guideline:   See Today in Office  Reason For Disposition Reached:   Earache also present  Advice Given:  N/A  Appointment Scheduled:  06/13/2012 16:00:00 Appointment Scheduled Provider:  Gwendolyn Grant (Adults only)  Allowed for driving time.

## 2012-06-13 NOTE — Patient Instructions (Signed)
It was good to see you today. Zpak antibiotics and prescription cough and decongestant syrup - Your prescription(s) have been submitted to your pharmacy. Please take as directed and contact our office if you believe you are having problem(s) with the medication(s). Alternate between ibuprofen and tylenol for aches, pain and fever symptoms as discussed Hydrate, rest and call if worse or unimproved

## 2012-06-13 NOTE — Progress Notes (Signed)
  Subjective:   HPI  complains of cold symptoms and sore throat  Onset >1 week ago, wax/wane symptoms  associated with rhinorrhea, sneezing, sore throat, mild headache and low grade fever Also myalgias, sinus pressure and mild-mod chest congestion No relief with OTC meds Precipitated by sick contacts  Past Medical History  Diagnosis Date  . Depression   . HTN (hypertension)   . Perianal pain     Chronic, Post-Op  . 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 Crohn's     Dr Sharlett Iles  . Anxiety   . Low back pain     FMS  . GERD (gastroesophageal reflux disease)   . Osteoarthritis     Dr Alvan Dame; both knees    Review of Systems Constitutional: No fever or night sweats, no unexpected weight change Pulmonary: No pleurisy or hemoptysis Cardiovascular: No chest pain or palpitations     Objective:   Physical Exam BP 120/72  Pulse 111  Temp(Src) 98.7 F (37.1 C) (Oral)  SpO2 97%  GEN: mildly ill appearing and audible head/chest congestion HENT: NCAT, mild sinus tenderness bilaterally, nares with clear discharge, oropharynx mild erythema, no exudate Eyes: Vision grossly intact, no conjunctivitis Lungs: Clear to auscultation without rhonchi or wheeze, no increased work of breathing Cardiovascular: Regular rate and rhythm, no bilateral edema  Lab Results  Component Value Date   WBC 7.9 01/26/2012   HGB 11.1* 01/26/2012   HCT 34.3* 01/26/2012   PLT 284.0 01/26/2012   GLUCOSE 91 01/26/2012   CHOL 172 05/27/2010   TRIG 100.0 05/27/2010   HDL 46.30 05/27/2010   LDLCALC 106* 05/27/2010   ALT 18 03/25/2011   AST 20 03/25/2011   NA 137 01/26/2012   K 4.3 01/26/2012   CL 101 01/26/2012   CREATININE 1.5* 01/26/2012   BUN 31* 01/26/2012   CO2 27 01/26/2012   TSH 0.93 01/26/2012   HGBA1C 5.9 12/17/2009      Assessment & Plan:  Viral URI -acute pharyngitis Cough, postnasal drip related to above   Empiric antibiotics prescribed due to symptom duration greater than 7 days Prescription cough  suppression with decongestant - new prescriptions done Symptomatic care with Tylenol or Advil, hydration and rest -  salt gargle advised as needed

## 2012-06-13 NOTE — Assessment & Plan Note (Signed)
BP Readings from Last 3 Encounters:  06/13/12 120/72  05/31/12 120/62  03/30/12 90/60   The current medical regimen is effective;  continue present plan and medications.

## 2012-06-13 NOTE — Telephone Encounter (Signed)
Use over-the-counter  "cold" medicines  such as  "Afrin" nasal spray for nasal congestion as directed instead. Use" Delsym" or" Robitussin" cough syrup varietis for cough.  You can use plain "Tylenol" or "Advil" for fever, chills and achyness.   "Common cold" symptoms are usually triggered by a virus.  The antibiotics are usually not necessary. On average, a" viral cold" illness would take 4-7 days to resolve. Please, make an appointment if you are not better or if you're worse.

## 2012-06-14 NOTE — Telephone Encounter (Signed)
Pt was seen in Office today by Dr. Asa Lente.

## 2012-07-06 DIAGNOSIS — Z0279 Encounter for issue of other medical certificate: Secondary | ICD-10-CM

## 2012-08-15 ENCOUNTER — Encounter (HOSPITAL_COMMUNITY): Payer: Self-pay | Admitting: *Deleted

## 2012-08-15 ENCOUNTER — Inpatient Hospital Stay (HOSPITAL_COMMUNITY)
Admission: EM | Admit: 2012-08-15 | Discharge: 2012-08-18 | DRG: 683 | Disposition: A | Discharge status: Home / Self Care | Payer: Medicare Other | Attending: Internal Medicine | Admitting: Internal Medicine

## 2012-08-15 DIAGNOSIS — F329 Major depressive disorder, single episode, unspecified: Secondary | ICD-10-CM | POA: Diagnosis present

## 2012-08-15 DIAGNOSIS — K130 Diseases of lips: Secondary | ICD-10-CM

## 2012-08-15 DIAGNOSIS — N179 Acute kidney failure, unspecified: Principal | ICD-10-CM

## 2012-08-15 DIAGNOSIS — R110 Nausea: Secondary | ICD-10-CM | POA: Diagnosis present

## 2012-08-15 DIAGNOSIS — K5010 Crohn's disease of large intestine without complications: Secondary | ICD-10-CM | POA: Diagnosis present

## 2012-08-15 DIAGNOSIS — N183 Chronic kidney disease, stage 3 unspecified: Secondary | ICD-10-CM | POA: Diagnosis present

## 2012-08-15 DIAGNOSIS — F411 Generalized anxiety disorder: Secondary | ICD-10-CM

## 2012-08-15 DIAGNOSIS — M1990 Unspecified osteoarthritis, unspecified site: Secondary | ICD-10-CM

## 2012-08-15 DIAGNOSIS — R1084 Generalized abdominal pain: Secondary | ICD-10-CM | POA: Diagnosis not present

## 2012-08-15 DIAGNOSIS — Z6841 Body Mass Index (BMI) 40.0 and over, adult: Secondary | ICD-10-CM

## 2012-08-15 DIAGNOSIS — R1011 Right upper quadrant pain: Secondary | ICD-10-CM | POA: Diagnosis not present

## 2012-08-15 DIAGNOSIS — IMO0001 Reserved for inherently not codable concepts without codable children: Secondary | ICD-10-CM

## 2012-08-15 DIAGNOSIS — I10 Essential (primary) hypertension: Secondary | ICD-10-CM

## 2012-08-15 DIAGNOSIS — N200 Calculus of kidney: Secondary | ICD-10-CM | POA: Diagnosis not present

## 2012-08-15 DIAGNOSIS — Z9049 Acquired absence of other specified parts of digestive tract: Secondary | ICD-10-CM | POA: Diagnosis not present

## 2012-08-15 DIAGNOSIS — K219 Gastro-esophageal reflux disease without esophagitis: Secondary | ICD-10-CM

## 2012-08-15 DIAGNOSIS — Z79899 Other long term (current) drug therapy: Secondary | ICD-10-CM | POA: Diagnosis not present

## 2012-08-15 DIAGNOSIS — E559 Vitamin D deficiency, unspecified: Secondary | ICD-10-CM

## 2012-08-15 DIAGNOSIS — K5090 Crohn's disease, unspecified, without complications: Secondary | ICD-10-CM

## 2012-08-15 DIAGNOSIS — G4700 Insomnia, unspecified: Secondary | ICD-10-CM

## 2012-08-15 DIAGNOSIS — M79609 Pain in unspecified limb: Secondary | ICD-10-CM

## 2012-08-15 DIAGNOSIS — J189 Pneumonia, unspecified organism: Secondary | ICD-10-CM

## 2012-08-15 DIAGNOSIS — R059 Cough, unspecified: Secondary | ICD-10-CM

## 2012-08-15 DIAGNOSIS — R1110 Vomiting, unspecified: Secondary | ICD-10-CM | POA: Diagnosis not present

## 2012-08-15 DIAGNOSIS — F3289 Other specified depressive episodes: Secondary | ICD-10-CM

## 2012-08-15 DIAGNOSIS — F4321 Adjustment disorder with depressed mood: Secondary | ICD-10-CM

## 2012-08-15 DIAGNOSIS — M545 Low back pain, unspecified: Secondary | ICD-10-CM

## 2012-08-15 DIAGNOSIS — R112 Nausea with vomiting, unspecified: Secondary | ICD-10-CM | POA: Diagnosis not present

## 2012-08-15 DIAGNOSIS — F410 Panic disorder [episodic paroxysmal anxiety] without agoraphobia: Secondary | ICD-10-CM

## 2012-08-15 DIAGNOSIS — I129 Hypertensive chronic kidney disease with stage 1 through stage 4 chronic kidney disease, or unspecified chronic kidney disease: Secondary | ICD-10-CM | POA: Diagnosis present

## 2012-08-15 DIAGNOSIS — A0472 Enterocolitis due to Clostridium difficile, not specified as recurrent: Secondary | ICD-10-CM | POA: Diagnosis not present

## 2012-08-15 DIAGNOSIS — R05 Cough: Secondary | ICD-10-CM

## 2012-08-15 DIAGNOSIS — R635 Abnormal weight gain: Secondary | ICD-10-CM

## 2012-08-15 DIAGNOSIS — E538 Deficiency of other specified B group vitamins: Secondary | ICD-10-CM

## 2012-08-15 DIAGNOSIS — E860 Dehydration: Secondary | ICD-10-CM

## 2012-08-15 DIAGNOSIS — N289 Disorder of kidney and ureter, unspecified: Secondary | ICD-10-CM

## 2012-08-15 DIAGNOSIS — D638 Anemia in other chronic diseases classified elsewhere: Secondary | ICD-10-CM

## 2012-08-15 DIAGNOSIS — R5383 Other fatigue: Secondary | ICD-10-CM

## 2012-08-15 DIAGNOSIS — M25569 Pain in unspecified knee: Secondary | ICD-10-CM

## 2012-08-15 DIAGNOSIS — J069 Acute upper respiratory infection, unspecified: Secondary | ICD-10-CM

## 2012-08-15 DIAGNOSIS — R109 Unspecified abdominal pain: Secondary | ICD-10-CM

## 2012-08-15 DIAGNOSIS — Z933 Colostomy status: Secondary | ICD-10-CM

## 2012-08-15 DIAGNOSIS — N19 Unspecified kidney failure: Secondary | ICD-10-CM | POA: Diagnosis not present

## 2012-08-15 DIAGNOSIS — N189 Chronic kidney disease, unspecified: Secondary | ICD-10-CM

## 2012-08-15 DIAGNOSIS — R5381 Other malaise: Secondary | ICD-10-CM

## 2012-08-15 HISTORY — DX: Ulcerative colitis, unspecified, without complications: K51.90

## 2012-08-15 HISTORY — DX: Enterocolitis due to Clostridium difficile, not specified as recurrent: A04.72

## 2012-08-15 LAB — CBC WITH DIFFERENTIAL/PLATELET
Eosinophils Absolute: 0.3 10*3/uL (ref 0.0–0.7)
Eosinophils Relative: 3 % (ref 0–5)
Hemoglobin: 12 g/dL (ref 12.0–15.0)
Lymphocytes Relative: 38 % (ref 12–46)
Lymphs Abs: 3.4 10*3/uL (ref 0.7–4.0)
MCH: 27.8 pg (ref 26.0–34.0)
MCV: 84 fL (ref 78.0–100.0)
Monocytes Relative: 5 % (ref 3–12)
Neutrophils Relative %: 54 % (ref 43–77)
Platelets: 320 10*3/uL (ref 150–400)
RBC: 4.32 MIL/uL (ref 3.87–5.11)
WBC: 9 10*3/uL (ref 4.0–10.5)

## 2012-08-15 NOTE — ED Notes (Signed)
Pt states that she began to feel bad on Thurs; pt c/o nausea and vomiting and abd cramping that has been persistent since Thurs

## 2012-08-16 ENCOUNTER — Emergency Department (HOSPITAL_COMMUNITY): Payer: Medicare Other

## 2012-08-16 ENCOUNTER — Encounter (HOSPITAL_COMMUNITY): Payer: Self-pay | Admitting: Internal Medicine

## 2012-08-16 ENCOUNTER — Inpatient Hospital Stay (HOSPITAL_COMMUNITY): Payer: Medicare Other

## 2012-08-16 DIAGNOSIS — R112 Nausea with vomiting, unspecified: Secondary | ICD-10-CM | POA: Diagnosis not present

## 2012-08-16 DIAGNOSIS — R109 Unspecified abdominal pain: Secondary | ICD-10-CM

## 2012-08-16 DIAGNOSIS — N179 Acute kidney failure, unspecified: Secondary | ICD-10-CM | POA: Diagnosis present

## 2012-08-16 DIAGNOSIS — E860 Dehydration: Secondary | ICD-10-CM | POA: Diagnosis present

## 2012-08-16 DIAGNOSIS — R110 Nausea: Secondary | ICD-10-CM | POA: Diagnosis present

## 2012-08-16 DIAGNOSIS — A0472 Enterocolitis due to Clostridium difficile, not specified as recurrent: Secondary | ICD-10-CM | POA: Diagnosis present

## 2012-08-16 DIAGNOSIS — R1110 Vomiting, unspecified: Secondary | ICD-10-CM | POA: Diagnosis not present

## 2012-08-16 DIAGNOSIS — N19 Unspecified kidney failure: Secondary | ICD-10-CM | POA: Diagnosis not present

## 2012-08-16 DIAGNOSIS — N200 Calculus of kidney: Secondary | ICD-10-CM | POA: Diagnosis not present

## 2012-08-16 HISTORY — DX: Enterocolitis due to Clostridium difficile, not specified as recurrent: A04.72

## 2012-08-16 LAB — URINE MICROSCOPIC-ADD ON

## 2012-08-16 LAB — OSMOLALITY, URINE: Osmolality, Ur: 482 mOsm/kg (ref 390–1090)

## 2012-08-16 LAB — URINALYSIS, ROUTINE W REFLEX MICROSCOPIC
Bilirubin Urine: NEGATIVE
Glucose, UA: NEGATIVE mg/dL
Hgb urine dipstick: NEGATIVE
Ketones, ur: NEGATIVE mg/dL
Protein, ur: NEGATIVE mg/dL
pH: 5 (ref 5.0–8.0)

## 2012-08-16 LAB — BASIC METABOLIC PANEL
BUN: 73 mg/dL — ABNORMAL HIGH (ref 6–23)
CO2: 23 mEq/L (ref 19–32)
Chloride: 101 mEq/L (ref 96–112)
Creatinine, Ser: 4.05 mg/dL — ABNORMAL HIGH (ref 0.50–1.10)
Glucose, Bld: 118 mg/dL — ABNORMAL HIGH (ref 70–99)
Potassium: 4.3 mEq/L (ref 3.5–5.1)

## 2012-08-16 LAB — CBC
HCT: 32.6 % — ABNORMAL LOW (ref 36.0–46.0)
Hemoglobin: 10.7 g/dL — ABNORMAL LOW (ref 12.0–15.0)
MCV: 84.2 fL (ref 78.0–100.0)
WBC: 8.2 10*3/uL (ref 4.0–10.5)

## 2012-08-16 LAB — COMPREHENSIVE METABOLIC PANEL
ALT: 14 U/L (ref 0–35)
Alkaline Phosphatase: 100 U/L (ref 39–117)
BUN: 76 mg/dL — ABNORMAL HIGH (ref 6–23)
CO2: 22 mEq/L (ref 19–32)
GFR calc Af Amer: 11 mL/min — ABNORMAL LOW (ref >90)
GFR calc non Af Amer: 10 mL/min — ABNORMAL LOW (ref >90)
Glucose, Bld: 89 mg/dL (ref 70–99)
Potassium: 4.4 mEq/L (ref 3.5–5.1)
Sodium: 136 mEq/L (ref 135–145)

## 2012-08-16 LAB — LIPASE, BLOOD: Lipase: 46 U/L (ref 11–59)

## 2012-08-16 LAB — CREATININE, URINE, RANDOM: Creatinine, Urine: 154.8 mg/dL

## 2012-08-16 LAB — CLOSTRIDIUM DIFFICILE BY PCR: Toxigenic C. Difficile by PCR: POSITIVE — AB

## 2012-08-16 MED ORDER — KCL IN DEXTROSE-NACL 40-5-0.9 MEQ/L-%-% IV SOLN
INTRAVENOUS | Status: DC
  Administered 2012-08-16: 06:00:00 via INTRAVENOUS
  Filled 2012-08-16 (×2): qty 1000

## 2012-08-16 MED ORDER — VITAMIN D 1000 UNITS PO TABS
3000.0000 [IU] | ORAL_TABLET | Freq: Every day | ORAL | Status: DC
  Administered 2012-08-16 – 2012-08-18 (×3): 3000 [IU] via ORAL
  Filled 2012-08-16 (×3): qty 3

## 2012-08-16 MED ORDER — RISAQUAD PO CAPS
1.0000 | ORAL_CAPSULE | Freq: Every day | ORAL | Status: DC
  Administered 2012-08-16 – 2012-08-18 (×3): 1 via ORAL
  Filled 2012-08-16 (×3): qty 1

## 2012-08-16 MED ORDER — HYDROMORPHONE HCL PF 1 MG/ML IJ SOLN
1.0000 mg | Freq: Once | INTRAMUSCULAR | Status: AC
  Administered 2012-08-16: 1 mg via INTRAVENOUS

## 2012-08-16 MED ORDER — HYDROMORPHONE HCL PF 1 MG/ML IJ SOLN
1.0000 mg | INTRAMUSCULAR | Status: DC | PRN
  Administered 2012-08-16 – 2012-08-18 (×12): 1 mg via INTRAVENOUS
  Filled 2012-08-16 (×12): qty 1

## 2012-08-16 MED ORDER — ALIGN PO CAPS: 1.0000 | ORAL_CAPSULE | Freq: Every day | ORAL | Status: DC

## 2012-08-16 MED ORDER — METRONIDAZOLE IN NACL 5-0.79 MG/ML-% IV SOLN
500.0000 mg | Freq: Three times a day (TID) | INTRAVENOUS | Status: DC
  Administered 2012-08-16 – 2012-08-18 (×7): 500 mg via INTRAVENOUS
  Filled 2012-08-16 (×8): qty 100

## 2012-08-16 MED ORDER — ONDANSETRON HCL 4 MG/2ML IJ SOLN
4.0000 mg | Freq: Four times a day (QID) | INTRAMUSCULAR | Status: DC | PRN
  Administered 2012-08-17 – 2012-08-18 (×2): 4 mg via INTRAVENOUS
  Filled 2012-08-16 (×2): qty 2

## 2012-08-16 MED ORDER — SODIUM CHLORIDE 0.9 % IV BOLUS (SEPSIS)
1000.0000 mL | Freq: Once | INTRAVENOUS | Status: AC
  Administered 2012-08-16: 1000 mL via INTRAVENOUS

## 2012-08-16 MED ORDER — CIPROFLOXACIN IN D5W 400 MG/200ML IV SOLN
400.0000 mg | INTRAVENOUS | Status: DC
  Administered 2012-08-16: 400 mg via INTRAVENOUS
  Filled 2012-08-16: qty 200

## 2012-08-16 MED ORDER — ONDANSETRON HCL 4 MG PO TABS: 4.0000 mg | ORAL_TABLET | Freq: Four times a day (QID) | ORAL | Status: DC | PRN

## 2012-08-16 MED ORDER — BOOST / RESOURCE BREEZE PO LIQD
1.0000 | Freq: Two times a day (BID) | ORAL | Status: DC
  Administered 2012-08-16 – 2012-08-18 (×4): 1 via ORAL

## 2012-08-16 MED ORDER — ONDANSETRON HCL 4 MG/2ML IJ SOLN
4.0000 mg | Freq: Once | INTRAMUSCULAR | Status: AC
  Administered 2012-08-16: 4 mg via INTRAVENOUS
  Filled 2012-08-16: qty 2

## 2012-08-16 MED ORDER — HYDROMORPHONE HCL PF 2 MG/ML IJ SOLN
INTRAMUSCULAR | Status: AC
  Filled 2012-08-16: qty 1

## 2012-08-16 MED ORDER — SODIUM CHLORIDE 0.9 % IV SOLN
INTRAVENOUS | Status: AC
  Administered 2012-08-16: 11:00:00 via INTRAVENOUS
  Administered 2012-08-17: 1000 mL via INTRAVENOUS
  Administered 2012-08-17 (×2): via INTRAVENOUS
  Administered 2012-08-18: 1000 mL via INTRAVENOUS

## 2012-08-16 MED ORDER — ENOXAPARIN SODIUM 40 MG/0.4ML ~~LOC~~ SOLN
40.0000 mg | SUBCUTANEOUS | Status: DC
  Administered 2012-08-16 – 2012-08-18 (×3): 40 mg via SUBCUTANEOUS
  Filled 2012-08-16 (×3): qty 0.4

## 2012-08-16 NOTE — ED Provider Notes (Signed)
History     CSN: [id number]  Arrival date & time 08/15/12  2041   None     Chief Complaint  Patient presents with  . Abdominal Pain   HPI  History provided by the patient. Patient is a 57 year old female with past history of hypertension, Crohn's disease with previous bowel resection and ileostomy who has been followed at [facility] as well as by Dr. Sharlett Iles who presents today with complaints of abdominal pain and nausea vomiting. Symptoms first began last Thursday 4-5 days ago. Patient felt symptoms may be a Crohn's flareup and she stopped eating and drinking to help with symptoms. She reports having only small amounts of Ginger Ale and occasional crackers over the past few days but reports continued pains and discomfort feeling. She denies any other associated symptoms. Denies any fever, chills or sweats. Denies any change in ostomy output or blood in stool. Denies any urinary changes, dysuria, hematuria urinary frequency or decreased urine output. Patient denies any other aggravating or alleviating factors.     Past Medical History  Diagnosis Date  . Depression   . HTN (hypertension)   . Perianal pain     Chronic, Post-Op  . 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 Crohn's     Dr Sharlett Iles  . Anxiety   . Low back pain     FMS  . GERD (gastroesophageal reflux disease)   . Osteoarthritis     Dr Alvan Dame; both knees    Past Surgical History  Procedure Laterality Date  . Colostomy    . Colectomy  2001    Family History  Problem Relation Age of Onset  . Hypertension    . Heart disease Father 25    CHF  . Depression Sister     History  Substance Use Topics  . Smoking status: Former Research scientist (life sciences)  . Smokeless tobacco: Not on file  . Alcohol Use: No    OB History   Grav Para Term Preterm Abortions TAB SAB Ect Mult Living                  Review of Systems  Constitutional: Positive for appetite change. Negative for fever, chills and diaphoresis.  Respiratory:  Negative for shortness of breath.   Cardiovascular: Negative for chest pain.  Gastrointestinal: Positive for nausea, vomiting and abdominal pain. Negative for diarrhea, constipation and blood in stool.  Genitourinary: Negative for dysuria, frequency, hematuria, flank pain, vaginal bleeding and vaginal discharge.  All other systems reviewed and are negative.    Allergies  Amphetamine-dextroamphetamine; Cymbalta; Erythromycin ethylsuccinate; and Morphine  Home Medications   Current Outpatient Rx  Name  Route  Sig  Dispense  Refill  . bifidobacterium infantis (ALIGN) capsule   Oral   Take 1 capsule by mouth daily.           . Cholecalciferol 1000 UNITS tablet   Oral   Take 3,000 Units by mouth daily.           . COBAL-1000 1000 MCG/ML injection   Intramuscular   Inject 1 mL (1,000 mcg total) into the muscle every 30 (thirty) days. Or SubQ   10 mL   6     Dispense as written.   Marland Kitchen olmesartan-hydrochlorothiazide (BENICAR HCT) 40-12.5 MG per tablet   Oral   Take 1 tablet by mouth daily.         Marland Kitchen oxyCODONE (ROXICODONE) 15 MG immediate release tablet   Oral   Take 1 tablet (15 mg  total) by mouth every 6 (six) hours as needed. Please fill on or after 08/12/12   120 tablet   0     BP 103/72  Pulse 85  Temp(Src) 97.7 F (36.5 C) (Oral)  Resp 18  SpO2 100%  Physical Exam  Nursing note and vitals reviewed. Constitutional: She is oriented to person, place, and time. She appears well-developed and well-nourished. No distress.  HENT:  Head: Normocephalic.  Cardiovascular: Normal rate and regular rhythm.   Pulmonary/Chest: Effort normal and breath sounds normal. No respiratory distress. She has no wheezes. She has no rales.  Abdominal: Soft. She exhibits no distension. There is tenderness. There is no rebound and no guarding.  Right lower cautery ileostomy present. No erythema or induration of skin. Patient does have diffuse tenderness throughout the abdomen. It is soft  without significant mass. No rebound tenderness.  Musculoskeletal: Normal range of motion.  Neurological: She is alert and oriented to person, place, and time.  Skin: Skin is warm and dry. No rash noted.  Psychiatric: She has a normal mood and affect. Her behavior is normal.    ED Course  Procedures   Results for orders placed during the hospital encounter of 08/15/12  CBC WITH DIFFERENTIAL      Result Value Range   WBC 9.0  4.0 - 10.5 K/uL   RBC 4.32  3.87 - 5.11 MIL/uL   Hemoglobin 12.0  12.0 - 15.0 g/dL   HCT 36.3  36.0 - 46.0 %   MCV 84.0  78.0 - 100.0 fL   MCH 27.8  26.0 - 34.0 pg   MCHC 33.1  30.0 - 36.0 g/dL   RDW 14.7  11.5 - 15.5 %   Platelets 320  150 - 400 K/uL   Neutrophils Relative 54  43 - 77 %   Neutro Abs 4.8  1.7 - 7.7 K/uL   Lymphocytes Relative 38  12 - 46 %   Lymphs Abs 3.4  0.7 - 4.0 K/uL   Monocytes Relative 5  3 - 12 %   Monocytes Absolute 0.5  0.1 - 1.0 K/uL   Eosinophils Relative 3  0 - 5 %   Eosinophils Absolute 0.3  0.0 - 0.7 K/uL   Basophils Relative 0  0 - 1 %   Basophils Absolute 0.0  0.0 - 0.1 K/uL  COMPREHENSIVE METABOLIC PANEL      Result Value Range   Sodium 136  135 - 145 mEq/L   Potassium 4.4  3.5 - 5.1 mEq/L   Chloride 96  96 - 112 mEq/L   CO2 22  19 - 32 mEq/L   Glucose, Bld 89  70 - 99 mg/dL   BUN 76 (*) 6 - 23 mg/dL   Creatinine, Ser 4.69 (*) 0.50 - 1.10 mg/dL   Calcium 10.1  8.4 - 10.5 mg/dL   Total Protein 8.7 (*) 6.0 - 8.3 g/dL   Albumin 4.3  3.5 - 5.2 g/dL   AST 13  0 - 37 U/L   ALT 14  0 - 35 U/L   Alkaline Phosphatase 100  39 - 117 U/L   Total Bilirubin 0.1 (*) 0.3 - 1.2 mg/dL   GFR calc non Af Amer 10 (*) >90 mL/min   GFR calc Af Amer 11 (*) >90 mL/min  LIPASE, BLOOD      Result Value Range   Lipase 46  11 - 59 U/L  POCT I-STAT TROPONIN I      Result Value Range  Troponin i, poc 0.02  0.00 - 0.08 ng/mL   Comment 3                 1. Renal failure   2. Nausea & vomiting   3. Abdominal pain       MDM   Patient seen and evaluated. Patient laying appears comfortable at this time in no acute distress.   Patient with significantly elevated BUN and creatinine. Patient also discussed with attending physician. Will admit for acute renal failure.  Spoke with Dr. Marin Comment with [facility]. He'll see patient and admit.  Would like a renal US exam ordered.      Date: 08/16/2012  Rate: 85  Rhythm: normal sinus rhythm  QRS Axis: normal  Intervals: normal  ST/T Wave abnormalities: normal  Conduction Disutrbances:none  Narrative Interpretation:   Old EKG Reviewed: none available     Martie Lee, PA-C 08/16/12 [phone]

## 2012-08-16 NOTE — Progress Notes (Addendum)
Patient admitted few hours ago by my partner for acute renal failure caused by Crohn's colitis flareup causing dehydration.   Patient is doing fairly well, I have consulted [facility], I have added Cipro and Flagyl for a Crohn's flareup, she might require Solu-Medrol will defer that to GI.   Her acute renal failure  appears to be prerenal from decreased by mouth intake due to Crohn's flareup, diarrhea causing dehydration and being on HCTZ/ARB. Offending medications have been stopped, continue aggressive IV fluid for hydration, repeat creatinine this morning has already shown improvement within a few hours, have added urine sodium, osmolality, creatinine, serum osmolality to her blood work to check fractional excretion of sodium. Renal ultrasound has been ordered. Repeat BMP in the morning. Will Pl., Foley catheter for 24 hours to monitor urine output closely and correctly.   Addendum - reviewed mid day, C diff +ve, stopped cipro, ? +ve C diff represents an active infection.

## 2012-08-16 NOTE — Care Management (Signed)
CARE MANAGEMENT NOTE 08/16/2012  Patient:  Emily Sanchez,Emily Sanchez   Account Number:  [id number]  Date Initiated:  08/16/2012  Documentation initiated by:  Kelaiah Escalona  Subjective/Objective Assessment:   57 yo female admitted with acute renal failure. PTA pt from home, independent.     Action/Plan:   Home when stable   Anticipated DC Date:     Anticipated DC Plan:  [place]  CM consult      Choice offered to / List presented to:  NA           Status of service:  In process, will continue to follow Medicare Important Message given?   (If response is "NO", the following Medicare IM given date fields will be blank) Date Medicare IM given:   Date Additional Medicare IM given:    Discharge Disposition:    Per UR Regulation:  Reviewed for med. necessity/level of care/duration of stay  If discussed at [facility] of Stay Meetings, dates discussed:    Comments:  08/16/12 [place] [phone] No needs assessed at this time.

## 2012-08-16 NOTE — H&P (Signed)
[facility] History and Physical  [name]:[id number] DOB: 1955/07/16    PCP:   Walker Kehr, MD   Chief Complaint: abdominal cramps, nausea and diarrhea through the ostomy.  HPI: Emily Sanchez is an 57 y.o. female with hx of crohn's colitis, followed by Dr Sharlett Iles locally and prior at [facility], though she hadn't seen neither for a while, not on any biological agent, hx of HTN on Benicar/HCT, s/p colostomy, presents to the ER with abdominal cramps and pain along with watery diarrhea and nausea for 3 days.  She denied any fever or chills.  Evaluation in the ER included a BMET showing that she is in ARF with Cr of 4.69 and BUN of 79.  She has no fever, and her WBC is 9K with normal Hb.  Hospitalist was asked to admit her for ARF and possible crohn's exacerbation.  She has not been on any antibiotics recently.  Rewiew of Systems:  Constitutional: Negative for malaise, fever and chills. No significant weight loss or weight gain Eyes: Negative for eye pain, redness and discharge, diplopia, visual changes, or flashes of light. ENMT: Negative for ear pain, hoarseness, nasal congestion, sinus pressure and sore throat. No headaches; tinnitus, drooling, or problem swallowing. Cardiovascular: Negative for chest pain, palpitations, diaphoresis, dyspnea and peripheral edema. ; No orthopnea, PND Respiratory: Negative for cough, hemoptysis, wheezing and stridor. No pleuritic chestpain. Gastrointestinal: Negative for  Vomiting, constipation, melena, blood in stool, hematemesis, jaundice and rectal bleeding.    Genitourinary: Negative for frequency, dysuria, incontinence,flank pain and hematuria; Musculoskeletal: Negative for back pain and neck pain. Negative for swelling and trauma.;  Skin: . Negative for pruritus, rash, abrasions, bruising and skin lesion.; ulcerations Neuro: Negative for headache, lightheadedness and neck stiffness. Negative for weakness, altered level of consciousness ,  altered mental status, extremity weakness, burning feet, involuntary movement, seizure and syncope.  Psych: negative for anxiety, depression, insomnia, tearfulness, panic attacks, hallucinations, paranoia, suicidal or homicidal ideation    Past Medical History  Diagnosis Date  . Depression   . HTN (hypertension)   . Perianal pain     Chronic, Post-Op  . 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 Crohn's     Dr Sharlett Iles  . Anxiety   . Low back pain     FMS  . GERD (gastroesophageal reflux disease)   . Osteoarthritis     Dr Alvan Dame; both knees    Past Surgical History  Procedure Laterality Date  . Colostomy    . Colectomy  2001    Medications:  HOME MEDS: Prior to Admission medications   Medication Sig Start Date End Date Taking? Authorizing Provider  bifidobacterium infantis (ALIGN) capsule Take 1 capsule by mouth daily.     Yes Historical Provider, MD  Cholecalciferol 1000 UNITS tablet Take 3,000 Units by mouth daily.     Yes Historical Provider, MD  COBAL-1000 1000 MCG/ML injection Inject 1 mL (1,000 mcg total) into the muscle every 30 (thirty) days. Or SubQ 01/06/11  Yes Evie Lacks Plotnikov, MD  olmesartan-hydrochlorothiazide (BENICAR HCT) 40-12.5 MG per tablet Take 1 tablet by mouth daily.   Yes Historical Provider, MD  oxyCODONE (ROXICODONE) 15 MG immediate release tablet Take 1 tablet (15 mg total) by mouth every 6 (six) hours as needed. Please fill on or after 08/12/12 05/31/12 09/02/12 Yes Cassandria Anger, MD     Allergies:  Allergies  Allergen Reactions  . Amphetamine-Dextroamphetamine   . Cymbalta (Duloxetine Hcl)     ? sleepy  .  Erythromycin Ethylsuccinate     REACTION: n/v  . Morphine     REACTION: nausea    Social History:   reports that she has quit smoking. She does not have any smokeless tobacco history on file. She reports that she does not drink alcohol or use illicit drugs.  Family History: Family History  Problem Relation Age of Onset  .  Hypertension    . Heart disease Father 54    CHF  . Depression Sister      Physical Exam: Filed Vitals:   08/15/12 2227 08/16/12 0006  BP:  103/72  Pulse: 80 85  Temp: 97.7 F (36.5 C) 97.7 F (36.5 C)  TempSrc: Oral Oral  Resp: 18   SpO2: 100% 100%   Blood pressure 103/72, pulse 85, temperature 97.7 F (36.5 C), temperature source Oral, resp. rate 18, SpO2 100.00%.  GEN:  Pleasant  patient lying in the stretcher in no acute distress; cooperative with exam. PSYCH:  alert and oriented x4; does not appear anxious or depressed; affect is appropriate. HEENT: Mucous membranes pink and anicteric; PERRLA; EOM intact; no cervical lymphadenopathy nor thyromegaly or carotid bruit; no JVD; There were no stridor. Neck is very supple. Breasts:: Not examined CHEST WALL: No tenderness CHEST: Normal respiration, clear to auscultation bilaterally.  HEART: Regular rate and rhythm.  There are no murmur, rub, or gallops.   BACK: No kyphosis or scoliosis; no CVA tenderness ABDOMEN: soft and tender diffusely but no rebound; no masses, no organomegaly, normal abdominal bowel sounds; no pannus; no intertriginous candida. There is no rebound and no distention. Rectal Exam: Not done EXTREMITIES: No bone or joint deformity; age-appropriate arthropathy of the hands and knees; no edema; no ulcerations.  There is no calf tenderness. Genitalia: not examined PULSES: 2+ and symmetric SKIN: Normal hydration no rash or ulceration CNS: Cranial nerves 2-12 grossly intact no focal lateralizing neurologic deficit.  Speech is fluent; uvula elevated with phonation, facial symmetry and tongue midline. DTR are normal bilaterally, cerebella exam is intact, barbinski is negative and strengths are equaled bilaterally.  No sensory loss.   Labs on Admission:  Basic Metabolic Panel:  Recent Labs Lab 08/15/12 2220  NA 136  K 4.4  CL 96  CO2 22  GLUCOSE 89  BUN 76*  CREATININE 4.69*  CALCIUM 10.1   Liver Function  Tests:  Recent Labs Lab 08/15/12 2220  AST 13  ALT 14  ALKPHOS 100  BILITOT 0.1*  PROT 8.7*  ALBUMIN 4.3    Recent Labs Lab 08/15/12 2220  LIPASE 46   No results found for this basename: AMMONIA,  in the last 168 hours CBC:  Recent Labs Lab 08/15/12 2220  WBC 9.0  NEUTROABS 4.8  HGB 12.0  HCT 36.3  MCV 84.0  PLT 320   Cardiac Enzymes: No results found for this basename: CKTOTAL, CKMB, CKMBINDEX, TROPONINI,  in the last 168 hours  CBG: No results found for this basename: GLUCAP,  in the last 168 hours   Radiological Exams on Admission: No results found.   Assessment/Plan Present on Admission:  . ARF (acute renal failure) . Dehydration . OBESITY, MORBID . HYPERTENSION . CROHN'S DISEASE . Anemia, chronic disease . B12 DEFICIENCY  PLAN:  She is admitted for ARF, felt to be prerenal on Benicar and HCTZ.  Will hold both medication and give IVF.  I suspect her ARF will improve.  I have gotten a renal US and requested a UA.  For her diarrhea, it is likely  that she has crohn's flare, but I will hold off on steroid for now and obtain stool studies.  She hadn't seen Dr Sharlett Iles in a while, so maybe we can consult him as she will likely need chronic suppressive therapy with biological agent.  She is otherwise stable, full code, and will be admitted to general medicine under [facility] service. Thank you for allowing me to participate in the care of your nice patient.  Other plans as per orders.  Code Status: FULL Haskel Khan, MD. [facility] Pager [phone] 7pm to 7am.  08/16/2012, 3:34 AM

## 2012-08-16 NOTE — ED Notes (Signed)
POCT PREG.  Result NEG.

## 2012-08-16 NOTE — Consult Note (Signed)
[facility] Consultation  Referring Provider:    [facility] Primary Care Physician:  Walker Kehr, MD Primary Gastroenterologist:  Verl Blalock, MD Reason for Consultation :   Crohn's flare          HPI: Emily Sanchez is a 57 y.o. female with a longstanding history of inflammatory bowel disease, patient reports Crohn's disease though I have seen ulcerative colitis on some of her records. She is s/p total abdominal colectomy around 2003. Patient was seen several years ago by Dr. Sharlett Iles in our office. She was also followed remotely at [facility]. Patient has not been to our office in at least 10 years, it sounds like she has not been to [facility] in several years either. He has not been on any medications for IBD and probably 10 years. Patient presents with nausea, vomiting and abdominal pain. She denies increased ostomy output. Output less if anything as she isn't eating. She typically does not get bowel changes with these episodes. She reports chronic, intermittent nausea vomiting and abdominal pain which usually resolves with initiation of a clear liquid diet. This time her nausea vomiting resolved but pain persisted. She does report decreased ostomy output. No blood in ostomy bag. No fevers. No arthralgias. No unusual weight loss.  KUB negative. Her white count is normal. Hemoglobin 10.7 after hydration. Lipase and liver function studies normal.  Past Medical History  Diagnosis Date  . Depression   . HTN (hypertension)   . Perianal pain     Chronic, Post-Op  . 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 Crohn's     Dr Sharlett Iles  . Anxiety   . Low back pain     FMS  . GERD (gastroesophageal reflux disease)   . Osteoarthritis     Dr Alvan Dame; both knees    Past Surgical History  Procedure Laterality Date  . Colostomy    . Colectomy  2001    Family History  Problem Relation Age of Onset  . Hypertension    . Heart disease Father 42    CHF  . Depression Sister       History  Substance Use Topics  . Smoking status: Former Research scientist (life sciences)  . Smokeless tobacco: Not on file  . Alcohol Use: No    Prior to Admission medications   Medication Sig Start Date End Date Taking? Authorizing Provider  bifidobacterium infantis (ALIGN) capsule Take 1 capsule by mouth daily.     Yes Historical Provider, MD  Cholecalciferol 1000 UNITS tablet Take 3,000 Units by mouth daily.     Yes Historical Provider, MD  COBAL-1000 1000 MCG/ML injection Inject 1 mL (1,000 mcg total) into the muscle every 30 (thirty) days. Or SubQ 01/06/11  Yes Evie Lacks Plotnikov, MD  olmesartan-hydrochlorothiazide (BENICAR HCT) 40-12.5 MG per tablet Take 1 tablet by mouth daily.   Yes Historical Provider, MD  oxyCODONE (ROXICODONE) 15 MG immediate release tablet Take 1 tablet (15 mg total) by mouth every 6 (six) hours as needed. Please fill on or after 08/12/12 05/31/12 09/02/12 Yes Cassandria Anger, MD    Current Facility-Administered Medications  Medication Dose Route Frequency Provider Last Rate Last Dose  . acidophilus (RISAQUAD) capsule 1 capsule  1 capsule Oral Daily Orvan Falconer, MD   1 capsule at 08/16/12 1008  . cholecalciferol (VITAMIN D) tablet 3,000 Units  3,000 Units Oral Daily Orvan Falconer, MD   3,000 Units at 08/16/12 1008  . ciprofloxacin (CIPRO) IVPB 400 mg  400 mg Intravenous Q24H Kara Mead,  RPH   400 mg at 08/16/12 1009  . dextrose 5 % and 0.9 % NaCl with KCl 40 mEq/L infusion   Intravenous Continuous Orvan Falconer, MD 125 mL/hr at 08/16/12 0545    . enoxaparin (LOVENOX) injection 40 mg  40 mg Subcutaneous Q24H Orvan Falconer, MD   40 mg at 08/16/12 1009  . HYDROmorphone (DILAUDID) injection 1 mg  1 mg Intravenous Q2H PRN Orvan Falconer, MD   1 mg at 08/16/12 9371  . metroNIDAZOLE (FLAGYL) IVPB 500 mg  500 mg Intravenous Q8H Thurnell Lose, MD   500 mg at 08/16/12 1009  . ondansetron (ZOFRAN) tablet 4 mg  4 mg Oral Q6H PRN Orvan Falconer, MD       Or  . ondansetron [facility]) injection 4 mg  4 mg  Intravenous Q6H PRN Orvan Falconer, MD        Allergies as of 08/15/2012 - Review Complete 08/15/2012  Allergen Reaction Noted  . Amphetamine-dextroamphetamine  01/20/2008  . Cymbalta (duloxetine hcl)  04/08/2011  . Erythromycin ethylsuccinate  11/06/2008  . Morphine  07/15/2007   Review of Systems:    All systems reviewed and negative except where noted in HPI.   PHYSICAL EXAM: Vital signs in last 24 hours: Temp:  [97.4 F (36.3 C)-97.7 F (36.5 C)] 97.4 F (36.3 C) (04/29 0439) Pulse Rate:  [80-92] 92 (04/29 0439) Resp:  [18] 18 (04/29 0439) BP: (103-108)/(56-72) 108/56 mmHg (04/29 0439) SpO2:  [98 %-100 %] 98 % (04/29 0439) Weight:  [264 lb (119.75 kg)] 264 lb (119.75 kg) (04/29 0439)   General:   Pleasant obese black female in NAD Head:  Normocephalic and atraumatic. Eyes:   No icterus.   Conjunctiva pink. Ears:  Normal auditory acuity. Neck:  Supple; no masses felt Lungs:  Respirations even and unlabored. Lungs clear to auscultation bilaterally.   No wheezes, crackles, or rhonchi.  Heart:  Regular rate and rhythm. Abdomen:  Soft, nondistended, nontender. Normal bowel sounds. No appreciable masses. Ostomy RLQ.  Rectal:  Not performed.  Msk:  Symmetrical without gross deformities.  Extremities:  Without edema. Neurologic:  Alert and  oriented x4;  grossly normal neurologically. Skin:  Intact without significant lesions or rashes. Cervical Nodes:  No significant cervical adenopathy. Psych:  Alert and cooperative. Normal affect.  LAB RESULTS:  Recent Labs  08/15/12 2220 08/16/12 0654  WBC 9.0 8.2  HGB 12.0 10.7*  HCT 36.3 32.6*  PLT 320 271   BMET  Recent Labs  08/15/12 2220 08/16/12 0654  NA 136 136  K 4.4 4.3  CL 96 101  CO2 22 23  GLUCOSE 89 118*  BUN 76* 73*  CREATININE 4.69* 4.05*  CALCIUM 10.1 9.4   LFT  Recent Labs  08/15/12 2220  PROT 8.7*  ALBUMIN 4.3  AST 13  ALT 14  ALKPHOS 100  BILITOT 0.1*    STUDIES: Dg Abd 1 View  08/16/2012   *RADIOLOGY REPORT*  Clinical Data: Abdominal pain, vomiting.  ABDOMEN - 1 VIEW  Comparison: 04/12/2010  Findings: Right lower quadrant ostomy noted.  Relative paucity of gas throughout the abdomen, similar to prior study.  No evidence of bowel obstruction or free air.  No organomegaly.  No suspicious calcification.  Stable calcified phleboliths in the right anatomic pelvis.  No acute bony abnormality.  IMPRESSION: No evidence of bowel obstruction or free air.  No acute findings.   Original Report Authenticated By: Rolm Baptise, M.D.    US Renal  08/16/2012  *RADIOLOGY REPORT*  Clinical Data: Elevated creatinine of 4.69.  Abdominal pain. Hypertension.  RENAL/URINARY TRACT ULTRASOUND COMPLETE  Comparison:  None.  Findings:  Right Kidney:  The right kidney measures 10.6 cm length.  Normal parenchymal thickness and echotexture.  No hydronephrosis.  Left Kidney:  The left kidney measures 9.4 cm length.  Mild focal parenchymal thinning suggesting chronic scarring.  No hydronephrosis.  3 mm echogenic focus in the mid pole with shadowing suggesting a nonobstructing stone.  Bladder:  The bladder is decompressed.  No filling defects are identified.  IMPRESSION: Mild parenchymal scarring in the left kidney.  3 mm nonobstructing intrarenal stone on the left.  No hydronephrosis of either kidney.   Original Report Authenticated By: Lucienne Capers, M.D.      PREVIOUS ENDOSCOPIES:            none in almost 10 years  IMPRESSION / PLAN:       1. Acute on chronic nausea, vomiting and diffuse abdominal pain. Her abdominal exam is not overly concerning. Her WBC is normal, she is afebrile, and not having increased ostomy output. This doesn't really sound like IBD flare at this point. KUB is negative for obstructive process. CTscan may be helpful when renal function improves if still not doing well from GI standpoint. Depending on clinical course she may need ileoscopy. Would hold off on steroids for now. Continue supportive  care. Stool for C-diff in progress but again she denies any diarrhea.   2. Acute renal failure, likely secondary to volume depletion from vomiting. Also she was on diuretics at home. Renal ultrasound without significant findings   3. Fibromyalgia  4.  B12 deficiency, gets IM injections Q month  Addendum 3:15pm: Surprisingly her c-diff is positive. Cipro was stopped by hospitalist.   Thanks   LOS: 1 day   Tye Savoy  08/16/2012, 10:11 AM   [facility] Attending  I have also seen and assessed the patient and agree with the above note.  She is C diff PCR + and that may be her recent problem. ? If background of Crohn's causing some intermittent problems. Treat C diff now, no other recommendations. Will follow and likely arrange outpatient f/u.  Gatha Mayer, MD, [facility] [phone] (pager) 08/16/2012 3:51 PM

## 2012-08-16 NOTE — Progress Notes (Signed)
INITIAL NUTRITION ASSESSMENT  DOCUMENTATION CODES Per approved criteria  -Morbid Obesity   INTERVENTION: - Diet advancement per MD - Resource Breeze mixed with Ginger-ale BID - Encouraged increased intake as nausea improves - Will continue to monitor   NUTRITION DIAGNOSIS: Altered GI function related to nausea/vomiting/abdominal pain as evidenced by pt report.    Goal: 1. Resolution of nausea/vomiting/abdominal pain 2. Advance diet as tolerated to regular diet  Monitor:  Weights, labs, intake, diet advancement, nausea/vomiting/abdominal pain  Reason for Assessment: Nutrition risk   57 y.o. female  Admitting Dx: ARF (acute renal failure)  ASSESSMENT: Pt with history of Crohn's disease s/p total colectomy and colostomy admitted with abdominal cramps and nausea. Pt seen by GI who noted pt with longstanding history of IBD. Pt reported chronic intermittent nausea, vomiting, and abdominal pain that typically resolves with initiation of clear liquid diet.   Met with pt who reports nausea/vomiting/abdominal pain started Thursday PTA. Pt reports eating only crackers and drinking Ginger-ale during this time frame. Pt reports vomiting 3 times/day since then with emesis reportedly being green and foamy in consistency. Pt denies any changes in weight. Pt reports so far today she had had 2 cups of Ginger-ale but nothing else. Pt still c/o nausea.   Height: Ht Readings from Last 1 Encounters:  08/16/12 5' 5"  (1.651 m)    Weight: Wt Readings from Last 1 Encounters:  08/16/12 264 lb (119.75 kg)    Ideal Body Weight: 125 lb  % Ideal Body Weight: 211  Wt Readings from Last 10 Encounters:  08/16/12 264 lb (119.75 kg)  05/31/12 260 lb (117.935 kg)  03/30/12 266 lb (120.657 kg)  01/26/12 267 lb 8 oz (121.337 kg)  10/29/11 274 lb (124.286 kg)  07/23/11 267 lb 1.9 oz (121.165 kg)  04/08/11 268 lb (121.564 kg)  01/06/11 278 lb (126.1 kg)  10/15/10 270 lb (122.471 kg)  08/12/10 270 lb  (122.471 kg)    Usual Body Weight: 264 lb per pt  % Usual Body Weight: 100  BMI:  Body mass index is 43.93 kg/(m^2). Class III extreme obesity  Estimated Nutritional Needs: Kcal: 1400-1600 Protein: 55-70g Fluid: 1.4-1.6L  Skin: Intact, colostomy  Diet Order: Clear Liquid  EDUCATION NEEDS: -Education needs addressed - discussed diet therapy for nausea and Crohn's disease    Intake/Output Summary (Last 24 hours) at 08/16/12 1204 Last data filed at 08/16/12 0900  Gross per 24 hour  Intake    740 ml  Output      2 ml  Net    738 ml    Last BM: PTA  Labs:   Recent Labs Lab 08/15/12 2220 08/16/12 0654  NA 136 136  K 4.4 4.3  CL 96 101  CO2 22 23  BUN 76* 73*  CREATININE 4.69* 4.05*  CALCIUM 10.1 9.4  GLUCOSE 89 118*    CBG (last 3)  No results found for this basename: GLUCAP,  in the last 72 hours  Scheduled Meds: . acidophilus  1 capsule Oral Daily  . cholecalciferol  3,000 Units Oral Daily  . ciprofloxacin  400 mg Intravenous Q24H  . enoxaparin (LOVENOX) injection  40 mg Subcutaneous Q24H  . metronidazole  500 mg Intravenous Q8H    Continuous Infusions: . sodium chloride      Past Medical History  Diagnosis Date  . Depression   . HTN (hypertension)   . Perianal pain     Chronic, Post-Op  . 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 Crohn's  Dr Sharlett Iles  . Anxiety   . Low back pain     FMS  . GERD (gastroesophageal reflux disease)   . Osteoarthritis     Dr Alvan Dame; both knees    Past Surgical History  Procedure Laterality Date  . Colostomy    . Colectomy  [address] [place], [place], [place] Pager [phone] After Hours Pager

## 2012-08-16 NOTE — Progress Notes (Signed)
ANTIBIOTIC CONSULT NOTE - INITIAL  Pharmacy Consult for Cipro Indication: empiric for crohn's flare  Allergies  Allergen Reactions  . Amphetamine-Dextroamphetamine   . Cymbalta (Duloxetine Hcl)     ? sleepy  . Erythromycin Ethylsuccinate     REACTION: n/v  . Morphine     REACTION: nausea    Patient Measurements: Height: 5' 5"  (165.1 cm) Weight: 264 lb (119.75 kg) IBW/kg (Calculated) : 57  Vital Signs: Temp: 97.4 F (36.3 C) (04/29 0439) Temp src: Oral (04/29 0439) BP: 108/56 mmHg (04/29 0439) Pulse Rate: 92 (04/29 0439) Intake/Output from previous day: 04/28 0701 - 04/29 0700 In: 120 [P.O.:120] Out: 1 [Stool:1] Intake/Output from this shift:    Labs:  Recent Labs  08/15/12 2220 08/16/12 0654  WBC 9.0 8.2  HGB 12.0 10.7*  PLT 320 271  CREATININE 4.69* 4.05*   Estimated Creatinine Clearance: 20.1 ml/min (by C-G formula based on Cr of 4.05). No results found for this basename: VANCOTROUGH, VANCOPEAK, VANCORANDOM, GENTTROUGH, GENTPEAK, GENTRANDOM, TOBRATROUGH, TOBRAPEAK, TOBRARND, AMIKACINPEAK, AMIKACINTROU, AMIKACIN,  in the last 72 hours   Microbiology: No results found for this or any previous visit (from the past 720 hour(s)).  Medical History: Past Medical History  Diagnosis Date  . Depression   . HTN (hypertension)   . Perianal pain     Chronic, Post-Op  . 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 Crohn's     Dr Sharlett Iles  . Anxiety   . Low back pain     FMS  . GERD (gastroesophageal reflux disease)   . Osteoarthritis     Dr Alvan Dame; both knees    Medications:  Scheduled:  . acidophilus  1 capsule Oral Daily  . cholecalciferol  3,000 Units Oral Daily  . enoxaparin (LOVENOX) injection  40 mg Subcutaneous Q24H  . [COMPLETED] HYDROmorphone      . [COMPLETED]  HYDROmorphone (DILAUDID) injection  1 mg Intravenous Once  . metronidazole  500 mg Intravenous Q8H  . [COMPLETED] ondansetron (ZOFRAN) IV  4 mg Intravenous Once  . [COMPLETED] sodium  chloride  1,000 mL Intravenous Once  . [DISCONTINUED] bifidobacterium infantis  1 capsule Oral Daily   Infusions:  . dextrose 5 % and 0.9 % NaCl with KCl 40 mEq/L 125 mL/hr at 08/16/12 0545   Assessment:  57 y.o. female with hx of crohn's colitis, followed by Dr Sharlett Iles locally and prior at [facility], though she hadn't seen neither for a while, not on any biological agent, hx of HTN on Benicar/HCT, s/p colostomy, presents to the ER with abdominal cramps and pain along with watery diarrhea and nausea for 3 days  To start empiric ciprofloxacin per pharmacy dosing  Goal of Therapy:  prevention/eradication of infection  Plan:  1) Cipro 453m IV q24 for CrCl < 30 ml/min 2) Follow renal function   JAdrian Saran PharmD, BCPS Pager [phone]/29/2014 7:52 AM

## 2012-08-16 NOTE — ED Provider Notes (Signed)
Medical screening examination/treatment/procedure(s) were performed by non-physician practitioner and as supervising physician I was immediately available for consultation/collaboration.   Wynetta Fines, MD 08/16/12 [phone]

## 2012-08-17 ENCOUNTER — Encounter (HOSPITAL_COMMUNITY): Payer: Self-pay | Admitting: Internal Medicine

## 2012-08-17 DIAGNOSIS — N19 Unspecified kidney failure: Secondary | ICD-10-CM | POA: Diagnosis not present

## 2012-08-17 DIAGNOSIS — K5090 Crohn's disease, unspecified, without complications: Secondary | ICD-10-CM

## 2012-08-17 DIAGNOSIS — R635 Abnormal weight gain: Secondary | ICD-10-CM

## 2012-08-17 DIAGNOSIS — R109 Unspecified abdominal pain: Secondary | ICD-10-CM | POA: Diagnosis not present

## 2012-08-17 DIAGNOSIS — A0472 Enterocolitis due to Clostridium difficile, not specified as recurrent: Secondary | ICD-10-CM | POA: Diagnosis not present

## 2012-08-17 DIAGNOSIS — R1011 Right upper quadrant pain: Secondary | ICD-10-CM | POA: Diagnosis not present

## 2012-08-17 LAB — BASIC METABOLIC PANEL
Chloride: 105 mEq/L (ref 96–112)
Creatinine, Ser: 2.41 mg/dL — ABNORMAL HIGH (ref 0.50–1.10)
GFR calc Af Amer: 25 mL/min — ABNORMAL LOW (ref >90)
Potassium: 4 mEq/L (ref 3.5–5.1)
Sodium: 137 mEq/L (ref 135–145)

## 2012-08-17 NOTE — Progress Notes (Signed)
Patient ID: Emily Sanchez, female   DOB: 09-Mar-1956, 57 y.o.   MRN: [id number]  [facility] PROGRESS NOTE  Emily Sanchez [name]:[id number] DOB: [birth year] DOA: 08/15/2012 PCP: Walker Kehr, MD  Brief narrative: Pt is 57 y.o. female with hx of crohn's colitis, followed by Dr Sharlett Iles locally and prior at [facility], though she hadn't seen neither for a while, not on any biological agent, hx of HTN on Benicar/HCTZ, s/p colostomy, presents to the ER with worsening generalized abdominal pain associated with watery diarrhea, nausea, poor oral intake. This started 3 days prior to admission with no specific alleviating factors. Patient denies fevers and chills on admission. In the emergency department, electrolyte panel indicated acute renal failure with creatinine of 4.69 and BUN of 79. TR[facility] was asked to admit for further evaluation and management.   Principal Problem:   ARF (acute renal failure) - This is most likely secondary to prerenal etiology  - review of records indicate that patient does have ongoing chronic kidney disease, stage II, with baseline creatinine 1.5 (01/2012) - Patient was started on IV fluids and is responding well, creatinine trending down: 4.69 --> 4.05 --> 2.41 - BMP in AM Active Problems:   OBESITY, MORBID - Patient is currently on clear liquid diet and, tolerating well we'll plan on advancing diet   HYPERTENSION - Reasonable inpatient control and even slightly on the soft side -  Continue IV fluids and encourage oral intake   CROHN'S DISEASE - Appreciate GI input, patient will likely need outpatient followup   Anemia, chronic disease - Hemoglobin slightly down from admission and likely delusional component - Will repeat CBC in the morning   Dehydration - Continue IV fluids as noted above and encourage by mouth intake   Enteritis due to Clostridium difficile - C. difficile by PCR positive, continue to monitor clinical status - Continue Flagyl day 2   Nausea and  vomiting - Likely secondary to ongoing C. difficile colitis - Patient denies nausea and vomiting this morning, will advance diet as patient able to tolerate them provide supportive care with antiemetics as needed  Consultants:  GI   Procedures/Studies:  Dg Abd 1 View 08/16/2012  No evidence of bowel obstruction or free air.  No acute findings.    US Renal 08/16/2012  Mild parenchymal scarring in the left kidney. 3 mm nonobstructing intrarenal stone on the left.  No hydronephrosis.  Antibiotics:  Flagyl 500 mg IV 04/29 -->  Code Status: Full Family Communication: Pt at bedside Disposition Plan: Home when medically stable  HPI/Subjective: No events overnight.   Objective: Filed Vitals:   08/16/12 1425 08/16/12 2051 08/17/12 0559 08/17/12 1009  BP: 99/54 105/53 129/66 103/76  Pulse: 91 87 90 93  Temp: 98.4 F (36.9 C) 98.2 F (36.8 C) 98.4 F (36.9 C) 98.5 F (36.9 C)  TempSrc: Oral Oral Oral Oral  Resp: 18 16 17 17   Height:      Weight:      SpO2: 96% 98% 100% 100%    Intake/Output Summary (Last 24 hours) at 08/17/12 1353 Last data filed at 08/17/12 0844  Gross per 24 hour  Intake 2285.42 ml  Output   1950 ml  Net 335.42 ml    Exam:   General:  Pt is alert, follows commands appropriately, not in acute distress  Cardiovascular: Regular rate and rhythm, S1/S2, no murmurs, no rubs, no gallops  Respiratory: Clear to auscultation bilaterally, no wheezing, no crackles, no rhonchi  Abdomen: Soft, non tender, non distended, bowel  sounds present, no guarding  Extremities: No edema, pulses DP and PT palpable bilaterally  Neuro: Grossly nonfocal  Data Reviewed: Basic Metabolic Panel:  Recent Labs Lab 08/15/12 2220 08/16/12 0654 08/17/12 0355  NA 136 136 137  K 4.4 4.3 4.0  CL 96 101 105  CO2 22 23 22   GLUCOSE 89 118* 95  BUN 76* 73* 54*  CREATININE 4.69* 4.05* 2.41*  CALCIUM 10.1 9.4 9.0   Liver Function Tests:  Recent Labs Lab 08/15/12 2220   AST 13  ALT 14  ALKPHOS 100  BILITOT 0.1*  PROT 8.7*  ALBUMIN 4.3    Recent Labs Lab 08/15/12 2220  LIPASE 46   CBC:  Recent Labs Lab 08/15/12 2220 08/16/12 0654  WBC 9.0 8.2  NEUTROABS 4.8  --   HGB 12.0 10.7*  HCT 36.3 32.6*  MCV 84.0 84.2  PLT 320 271   Recent Results (from the past 240 hour(s))  STOOL CULTURE     Status: None   Collection Time    08/16/12  5:50 AM      Result Value Range Status   Specimen Description STOOL   Final   Special Requests Normal   Final   Culture Culture reincubated for better growth   Final   Report Status PENDING   Incomplete  CLOSTRIDIUM DIFFICILE BY PCR     Status: Abnormal   Collection Time    08/16/12  5:50 AM      Result Value Range Status   C difficile by pcr POSITIVE (*) NEGATIVE Final   Comment: CRITICAL RESULT CALLED TO, READ BACK BY AND VERIFIED WITH:     MACABAUAD RN 11:00 08/16/12 ([email or web address])     Scheduled Meds: . acidophilus  1 capsule Oral Daily  . cholecalciferol  3,000 Units Oral Daily  . enoxaparin injection  40 mg Subcutaneous Q24H  . metronidazole  500 mg Intravenous Q8H   Continuous Infusions: . sodium chloride 125 mL/hr at 08/17/12 1205   Faye Ramsay, MD  [place] Pager [phone]  If 7PM-7AM, please contact night-coverage [email or web address] Password [email or web address] 08/17/2012, 1:53 PM   LOS: 2 days

## 2012-08-17 NOTE — Progress Notes (Signed)
Foley catheter removed. Patient tolerated procedure well.  Due to void 1620.  Will continue to monitor

## 2012-08-17 NOTE — Progress Notes (Signed)
I reviewed her paper chart/old records in office - she had ulcerative colitis and not Crohn's disease - as per pathology and serology testing.  So she does not need GI f/u necessarily.  Gatha Mayer, MD, [facility] [phone] (pager) 08/17/2012 5:12 PM

## 2012-08-17 NOTE — Progress Notes (Signed)
[facility] Progress Note  SUBJECTIVE: feels okay. Abdominal pain manageable, some nausea  OBJECTIVE:  Vital signs in last 24 hours: Temp:  [98.2 F (36.8 C)-98.4 F (36.9 C)] 98.4 F (36.9 C) (04/30 0559) Pulse Rate:  [87-91] 90 (04/30 0559) Resp:  [16-18] 17 (04/30 0559) BP: (99-129)/(53-66) 129/66 mmHg (04/30 0559) SpO2:  [96 %-100 %] 100 % (04/30 0559) Last BM Date: 08/16/12 General:    Pleasant black female in NAD Abdomen:  Soft, nondistended, mild diffuse lower abdominal tenderness. Normal bowel sounds. Extremities:  Without edema. Neurologic:  Alert and oriented,  grossly normal neurologically. Psych:  Cooperative. Normal mood and affect.   Lab Results:  Recent Labs  08/15/12 2220 08/16/12 0654  WBC 9.0 8.2  HGB 12.0 10.7*  HCT 36.3 32.6*  PLT 320 271   BMET  Recent Labs  08/15/12 2220 08/16/12 0654 08/17/12 0355  NA 136 136 137  K 4.4 4.3 4.0  CL 96 101 105  CO2 22 23 22   GLUCOSE 89 118* 95  BUN 76* 73* 54*  CREATININE 4.69* 4.05* 2.41*  CALCIUM 10.1 9.4 9.0   LFT  Recent Labs  08/15/12 2220  PROT 8.7*  ALBUMIN 4.3  AST 13  ALT 14  ALKPHOS 100  BILITOT 0.1*     ASSESSMENT / PLAN:  1. C-diff, continue Flagyl. Will try and advance diet from clears. If tolerates then we can try changing flagyl to PO.   2.  ARF, improving  3  History of crohn's disease, s/p remote total colectomy with ileostomy. Hasn't required any treatment in 10+ years. She does admit to occasional nausea, vomiting, cramping manageable with temporary clear liquid diet. She will need outpatient follow up of crohn's. .    LOS: 2 days   Tye Savoy  08/17/2012, 10:01 AM   [facility] Attending  I have also seen and assessed the patient and agree with the above note.  She is somewhat better. She prefers to see Dr. Alain Marion (PCP) and then decide about GI f/u - her choice. I did explain that reasonable for her to see [place] again and be reassessed for  active Crohn's.  Would treat w/ metronidazole x 10 days total. Call us back if further ?'s  Thanks  Gatha Mayer, MD, [facility]

## 2012-08-18 DIAGNOSIS — N19 Unspecified kidney failure: Secondary | ICD-10-CM | POA: Diagnosis not present

## 2012-08-18 DIAGNOSIS — R1011 Right upper quadrant pain: Secondary | ICD-10-CM | POA: Diagnosis not present

## 2012-08-18 DIAGNOSIS — R109 Unspecified abdominal pain: Secondary | ICD-10-CM | POA: Diagnosis not present

## 2012-08-18 LAB — CBC
Hemoglobin: 9.3 g/dL — ABNORMAL LOW (ref 12.0–15.0)
MCV: 84.3 fL (ref 78.0–100.0)
Platelets: 210 10*3/uL (ref 150–400)
RBC: 3.51 MIL/uL — ABNORMAL LOW (ref 3.87–5.11)
WBC: 5.4 10*3/uL (ref 4.0–10.5)

## 2012-08-18 LAB — BASIC METABOLIC PANEL
CO2: 23 mEq/L (ref 19–32)
Calcium: 8.8 mg/dL (ref 8.4–10.5)
Chloride: 107 mEq/L (ref 96–112)
Potassium: 3.8 mEq/L (ref 3.5–5.1)
Sodium: 139 mEq/L (ref 135–145)

## 2012-08-18 MED ORDER — METRONIDAZOLE 500 MG PO TABS
500.0000 mg | ORAL_TABLET | Freq: Three times a day (TID) | ORAL | Status: DC
  Administered 2012-08-18: 500 mg via ORAL
  Filled 2012-08-18 (×3): qty 1

## 2012-08-18 MED ORDER — RISAQUAD PO CAPS: 1.0000 | ORAL_CAPSULE | Freq: Every day | ORAL | Status: DC

## 2012-08-18 MED ORDER — PROMETHAZINE HCL 25 MG PO TABS: 25.0000 mg | ORAL_TABLET | ORAL | Status: DC | PRN

## 2012-08-18 MED ORDER — PROMETHAZINE HCL 25 MG/ML IJ SOLN: 25.0000 mg | Freq: Four times a day (QID) | INTRAMUSCULAR | Status: DC | PRN

## 2012-08-18 MED ORDER — HYDROMORPHONE HCL 2 MG PO TABS: 4.0000 mg | ORAL_TABLET | ORAL | Status: DC | PRN

## 2012-08-18 MED ORDER — METRONIDAZOLE 500 MG PO TABS: 500.0000 mg | ORAL_TABLET | Freq: Three times a day (TID) | ORAL | Status: DC

## 2012-08-18 NOTE — Discharge Summary (Signed)
Physician Discharge Summary  Emily Sanchez HWT:[id number] DOB: 1955/05/20 DOA: 08/15/2012  PCP: Walker Kehr, MD  Admit date: 08/15/2012 Discharge date: 08/18/2012  Recommendations for Outpatient Follow-up:  1. Pt will need to follow up with PCP in 2-3 weeks post discharge 2. Please obtain BMP to evaluate electrolytes and kidney function 3. Please also check CBC to evaluate Hg and Hct levels 4. Please note the patient was discharged on Flagyl 500 mg tablet 3 times a day, 11 more days post discharge 5. Please note the patient was also given prescription for Phenergan to help with symptoms of nausea if needed while on Flagyl  Discharge Diagnoses: Acute renal failure, secondary to dehydration, C. difficile colitis Principal Problem:   ARF (acute renal failure) Active Problems:   B12 DEFICIENCY   OBESITY, MORBID   HYPERTENSION   CROHN'S DISEASE   Anemia, chronic disease   Dehydration   Enteritis due to Clostridium difficile   Nausea and vomiting  Discharge Condition: Stable  Diet recommendation: Heart healthy diet discussed in details   Brief narrative:  Pt is 57 y.o. female with hx of crohn's colitis, followed by Dr Sharlett Iles locally and prior at [facility], though she hadn't seen neither for a while, not on any biological agent, hx of HTN on Benicar/HCTZ, s/p colostomy, presents to the ER with worsening generalized abdominal pain associated with watery diarrhea, nausea, poor oral intake. This started 3 days prior to admission with no specific alleviating factors. Patient denies fevers and chills on admission. In the emergency department, electrolyte panel indicated acute renal failure with creatinine of 4.69 and BUN of 79. TRH was asked to admit for further evaluation and management.   Principal Problem:  ARF (acute renal failure)  - This is most likely secondary to prerenal etiology  - review of records indicate that patient does have ongoing chronic kidney disease, stage II, with  baseline creatinine 1.5 (01/2012)  - Patient was started on IV fluids and is responding well, creatinine trending down: 4.69 --> 4.05 --> 2.41 --> 1.58 - Creatinine now remains around patient's baseline, will need outpatient followup and had electrolyte panel checked in 3 weeks  Active Problems:  OBESITY, MORBID  - Patient tolerating her diet well, dietary recommendations provided HYPERTENSION  - Reasonable inpatient control CROHN'S DISEASE  - Appreciate GI input, they recommended continuing Flagyl for C. difficile colitis and outpatient followup Anemia, chronic disease  - Hemoglobin slightly down from admission and likely delusional component  Dehydration  - Patient was provided IV fluids and has responded well, now tolerating oral intake and euvolemic on exam Enteritis due to Clostridium difficile  - C. difficile by PCR positive, continue to monitor clinical status  - Continue Flagyl day 3, patient will need to continue Flagyl for 11 more days post discharge Nausea and vomiting  - Likely secondary to ongoing C. difficile colitis  - Patient tolerating her diet well, we'll continue Phenergan as needed  Consultants:  GI  Procedures/Studies:  Dg Abd 1 View 08/16/2012 No evidence of bowel obstruction or free air. No acute findings.  US Renal 08/16/2012 Mild parenchymal scarring in the left kidney. 3 mm nonobstructing intrarenal stone on the left. No hydronephrosis. Antibiotics:  Flagyl 500 mg IV 04/29 --> changed to PO 08/18/2012, will need 11 more days post discharge   Code Status: Full  Family Communication: Pt at bedside   Discharge Exam: Filed Vitals:   08/18/12 0516  BP: 111/64  Pulse: 84  Temp: 98.4 F (36.9 C)  Resp: 16  Filed Vitals:   08/17/12 1323 08/17/12 2020 08/17/12 2032 08/18/12 0516  BP: 114/68  126/68 111/64  Pulse: 95 90 87 84  Temp: 98 F (36.7 C)  98.5 F (36.9 C) 98.4 F (36.9 C)  TempSrc: Oral  Oral Oral  Resp: 16  18 16   Height:      Weight:       SpO2: 100%  98% 98%    General: Pt is alert, follows commands appropriately, not in acute distress Cardiovascular: Regular rate and rhythm, S1/S2 +, no murmurs, no rubs, no gallops Respiratory: Clear to auscultation bilaterally, no wheezing, no crackles, no rhonchi Abdominal: Soft, non tender, non distended, bowel sounds +, no guarding Extremities: no edema, no cyanosis, pulses palpable bilaterally DP and PT Neuro: Grossly nonfocal  Discharge Instructions  Discharge Orders   Future Appointments Provider Department Dept Phone   08/31/2012 9:30 AM Cassandria Anger, MD [facility] [phone]   Future Orders Complete By Expires     Diet - low sodium heart healthy  As directed     Increase activity slowly  As directed         Medication List    STOP taking these medications       oxyCODONE 15 MG immediate release tablet  Commonly known as:  ROXICODONE      TAKE these medications       bifidobacterium infantis capsule  Take 1 capsule by mouth daily.     acidophilus Caps  Take 1 capsule by mouth daily.     Cholecalciferol 1000 UNITS tablet  Take 3,000 Units by mouth daily.     COBAL-1000 1000 MCG/ML injection  Generic drug:  cyanocobalamin  Inject 1 mL (1,000 mcg total) into the muscle every 30 (thirty) days. Or SubQ     HYDROmorphone 2 MG tablet  Commonly known as:  DILAUDID  Take 2 tablets (4 mg total) by mouth every 4 (four) hours as needed for pain.     metroNIDAZOLE 500 MG tablet  Commonly known as:  FLAGYL  Take 1 tablet (500 mg total) by mouth every 8 (eight) hours.     olmesartan-hydrochlorothiazide 40-12.5 MG per tablet  Commonly known as:  BENICAR HCT  Take 1 tablet by mouth daily.     promethazine 25 MG tablet  Commonly known as:  PHENERGAN  Take 1 tablet (25 mg total) by mouth every 4 (four) hours as needed for nausea.           Follow-up Information   Follow up with Walker Kehr, MD.   Contact information:    [address] [place]  [phone]       Follow up with Faye Ramsay, MD. (As needed)    Contact information:   [address] [place] [address] cell [phone]       The results of significant diagnostics from this hospitalization (including imaging, microbiology, ancillary and laboratory) are listed below for reference.     Microbiology: Recent Results (from the past 240 hour(s))  STOOL CULTURE     Status: None   Collection Time    08/16/12  5:50 AM      Result Value Range Status   Specimen Description STOOL   Final   Special Requests Normal   Final   Culture NO SUSPICIOUS COLONIES, CONTINUING TO HOLD   Final   Report Status PENDING   Incomplete  CLOSTRIDIUM DIFFICILE BY PCR  Status: Abnormal   Collection Time    08/16/12  5:50 AM      Result Value Range Status   C difficile by pcr POSITIVE (*) NEGATIVE Final   Comment: CRITICAL RESULT CALLED TO, READ BACK BY AND VERIFIED WITH:     MACABAUAD RN 11:00 08/16/12 ([email or web address])     Labs: Basic Metabolic Panel:  Recent Labs Lab 08/15/12 2220 08/16/12 0654 08/17/12 0355 08/18/12 0353  NA 136 136 137 139  K 4.4 4.3 4.0 3.8  CL 96 101 105 107  CO2 22 23 22 23   GLUCOSE 89 118* 95 106*  BUN 76* 73* 54* 33*  CREATININE 4.69* 4.05* 2.41* 1.58*  CALCIUM 10.1 9.4 9.0 8.8   Liver Function Tests:  Recent Labs Lab 08/15/12 2220  AST 13  ALT 14  ALKPHOS 100  BILITOT 0.1*  PROT 8.7*  ALBUMIN 4.3    Recent Labs Lab 08/15/12 2220  LIPASE 46  CBC:  Recent Labs Lab 08/15/12 2220 08/16/12 0654 08/18/12 0353  WBC 9.0 8.2 5.4  NEUTROABS 4.8  --   --   HGB 12.0 10.7* 9.3*  HCT 36.3 32.6* 29.6*  MCV 84.0 84.2 84.3  PLT 320 271 210    SIGNED: Time coordinating discharge: Over 30 minutes  Faye Ramsay, MD  [facility] 08/18/2012, 12:46 PM Pager [phone]  If 7PM-7AM, please contact night-coverage [email or web address] Password  [email or web address]

## 2012-08-18 NOTE — Progress Notes (Signed)
Pt is to be discharged home today. Pt is in NAD, IV is out, all paperwork has been reviewed/discussed with patient, and there are no questions/concerns at this time. Assessment is unchanged from this morning. Pt is to be accompanied downstairs by staff and family via wheelchair.  

## 2012-08-18 NOTE — Plan of Care (Signed)
Problem: Phase I Progression Outcomes Goal: OOB as tolerated unless otherwise ordered Outcome: Completed/Met Date Met:  08/18/12 Up as tolerated in room and to bathroom.

## 2012-08-26 DIAGNOSIS — M1710 Unilateral primary osteoarthritis, unspecified knee: Secondary | ICD-10-CM | POA: Diagnosis not present

## 2012-08-31 ENCOUNTER — Encounter: Payer: Self-pay | Admitting: Internal Medicine

## 2012-08-31 ENCOUNTER — Ambulatory Visit (INDEPENDENT_AMBULATORY_CARE_PROVIDER_SITE_OTHER): Payer: Medicare Other | Admitting: Internal Medicine

## 2012-08-31 VITALS — BP 90/58 | HR 76 | Temp 97.8°F | Resp 16 | Wt 258.0 lb

## 2012-08-31 DIAGNOSIS — F411 Generalized anxiety disorder: Secondary | ICD-10-CM | POA: Diagnosis not present

## 2012-08-31 DIAGNOSIS — E538 Deficiency of other specified B group vitamins: Secondary | ICD-10-CM | POA: Diagnosis not present

## 2012-08-31 DIAGNOSIS — M545 Low back pain: Secondary | ICD-10-CM

## 2012-08-31 DIAGNOSIS — G4700 Insomnia, unspecified: Secondary | ICD-10-CM

## 2012-08-31 DIAGNOSIS — I10 Essential (primary) hypertension: Secondary | ICD-10-CM

## 2012-08-31 DIAGNOSIS — A0472 Enterocolitis due to Clostridium difficile, not specified as recurrent: Secondary | ICD-10-CM

## 2012-08-31 DIAGNOSIS — E559 Vitamin D deficiency, unspecified: Secondary | ICD-10-CM

## 2012-08-31 MED ORDER — OXYCODONE HCL 15 MG PO TABS: 15.0000 mg | ORAL_TABLET | Freq: Four times a day (QID) | ORAL | Status: AC | PRN

## 2012-08-31 NOTE — Assessment & Plan Note (Signed)
Continue with current prescription therapy as reflected on the Med list.  

## 2012-08-31 NOTE — Progress Notes (Signed)
Subjective:     HPI  F/u post - hosp (d/c 5/1) for  ARF (acute renal failure)  - This is most likely secondary to prerenal etiology  - review of records indicate that patient does have ongoing chronic kidney disease, stage II, with baseline creatinine 1.5 (01/2012)  - Patient was started on IV fluids and is responding well, creatinine trending down: 4.69 --> 4.05 --> 2.41 --> 1.58  - Creatinine now remains around patient's baseline, will need outpatient followup and had electrolyte panel checked in 3 weeks  Active Problems:  OBESITY, MORBID  - Patient tolerating her diet well, dietary recommendations provided  HYPERTENSION  - Reasonable inpatient control  CROHN'S DISEASE  - Appreciate GI input, they recommended continuing Flagyl for C. difficile colitis and outpatient followup  Anemia, chronic disease  - Hemoglobin slightly down from admission and likely delusional component  Dehydration  - Patient was provided IV fluids and has responded well, now tolerating oral intake and euvolemic on exam  Enteritis due to Clostridium difficile  - C. difficile by PCR positive, continue to monitor clinical status  - Continue Flagyl day 3, patient will need to continue Flagyl for 11 more days post discharge  Nausea and vomiting  - Likely secondary to ongoing C. difficile colitis  - Patient tolerating her diet well, we'll continue Phenergan as needed  Consultants:  GI  Procedures/Studies:  Dg Abd 1 View 08/16/2012 No evidence of bowel obstruction or free air. No acute findings.  US Renal 08/16/2012 Mild parenchymal scarring in the left kidney. 3 mm nonobstructing intrarenal stone on the left. No hydronephrosis. Antibiotics:  Flagyl 500 mg IV 04/29 --> changed to PO 08/18/2012, will need 11 more days post discharge     The patient is here to follow up on chronic depression, anxiety, headaches and chronic moderate fibromyalgia symptoms controlled partially with medicines, diet - FMS were  worse. C/o fatigue   Wt Readings from Last 3 Encounters:  08/31/12 258 lb (117.028 kg)  08/16/12 264 lb (119.75 kg)  05/31/12 260 lb (117.935 kg)   BP Readings from Last 3 Encounters:  08/31/12 90/58  08/18/12 111/64  06/13/12 120/72       Review of Systems  Constitutional: Negative for activity change, appetite change, fatigue and unexpected weight change.  HENT: Positive for tinnitus (R). Negative for congestion, mouth sores and sinus pressure.   Eyes: Negative for visual disturbance.  Respiratory: Negative for chest tightness.   Cardiovascular: Negative for leg swelling.  Gastrointestinal: Positive for abdominal distention.  Genitourinary: Negative for difficulty urinating and vaginal pain.  Musculoskeletal: Positive for back pain. Negative for gait problem.  Skin: Negative for pallor and rash.  Neurological: Positive for dizziness. Negative for tremors, weakness and numbness.  Psychiatric/Behavioral: Positive for sleep disturbance. Negative for suicidal ideas and confusion. The patient is nervous/anxious.        Objective:   Physical Exam  Constitutional: She appears well-developed. No distress.  HENT:  Head: Normocephalic.  Right Ear: External ear normal.  Left Ear: External ear normal.  Nose: Nose normal.  Mouth/Throat: Oropharynx is clear and moist.  Eyes: Conjunctivae are normal. Pupils are equal, round, and reactive to light. Right eye exhibits no discharge. Left eye exhibits no discharge.  Neck: Normal range of motion. Neck supple. No JVD present. No tracheal deviation present. No thyromegaly present.  Cardiovascular: Normal rate, regular rhythm and normal heart sounds.   Pulmonary/Chest: No stridor. No respiratory distress. She has no wheezes.  Abdominal: Soft. Bowel  sounds are normal. She exhibits no distension and no mass. There is no tenderness. There is no rebound and no guarding.  Musculoskeletal: She exhibits tenderness (LS). She exhibits no edema.  R  b.anserina is tender  Lymphadenopathy:    She has no cervical adenopathy.  Neurological: She displays normal reflexes. No cranial nerve deficit. She exhibits normal muscle tone. Coordination normal.  Skin: No rash noted. No erythema.  Psychiatric: She has a normal mood and affect. Her behavior is normal. Judgment and thought content normal.   Lab Results  Component Value Date   WBC 5.4 08/18/2012   HGB 9.3* 08/18/2012   HCT 29.6* 08/18/2012   PLT 210 08/18/2012   GLUCOSE 106* 08/18/2012   CHOL 172 05/27/2010   TRIG 100.0 05/27/2010   HDL 46.30 05/27/2010   LDLCALC 106* 05/27/2010   ALT 14 08/15/2012   AST 13 08/15/2012   NA 139 08/18/2012   K 3.8 08/18/2012   CL 107 08/18/2012   CREATININE 1.58* 08/18/2012   BUN 33* 08/18/2012   CO2 23 08/18/2012   TSH 0.93 01/26/2012   HGBA1C 5.9 12/17/2009       Assessment & Plan:

## 2012-08-31 NOTE — Assessment & Plan Note (Signed)
Treated

## 2012-08-31 NOTE — Assessment & Plan Note (Addendum)
Low BP: off BP meds BP Readings from Last 3 Encounters:  08/31/12 90/58  08/18/12 111/64  06/13/12 120/72

## 2012-09-22 ENCOUNTER — Telehealth: Payer: Self-pay | Admitting: *Deleted

## 2012-09-22 NOTE — Telephone Encounter (Signed)
Pt informed

## 2012-09-22 NOTE — Telephone Encounter (Signed)
Pt c/o leg/foot edema and intermittent HA since stopping Benicar. Could this be related? What should she do?

## 2012-09-22 NOTE — Telephone Encounter (Signed)
Yes. Re-start Benicar HCT See if better Thx

## 2012-10-10 ENCOUNTER — Encounter (INDEPENDENT_AMBULATORY_CARE_PROVIDER_SITE_OTHER): Payer: Medicare Other | Admitting: *Deleted

## 2012-10-10 DIAGNOSIS — G4700 Insomnia, unspecified: Secondary | ICD-10-CM

## 2012-10-10 DIAGNOSIS — M545 Low back pain: Secondary | ICD-10-CM

## 2012-10-10 DIAGNOSIS — E559 Vitamin D deficiency, unspecified: Secondary | ICD-10-CM

## 2012-10-10 DIAGNOSIS — F411 Generalized anxiety disorder: Secondary | ICD-10-CM | POA: Diagnosis not present

## 2012-10-10 DIAGNOSIS — I10 Essential (primary) hypertension: Secondary | ICD-10-CM

## 2012-10-10 DIAGNOSIS — A0472 Enterocolitis due to Clostridium difficile, not specified as recurrent: Secondary | ICD-10-CM

## 2012-10-10 DIAGNOSIS — E538 Deficiency of other specified B group vitamins: Secondary | ICD-10-CM | POA: Diagnosis not present

## 2012-10-10 LAB — BASIC METABOLIC PANEL WITH GFR
BUN: 42 mg/dL — ABNORMAL HIGH (ref 6–23)
CO2: 26 meq/L (ref 19–32)
Calcium: 9.7 mg/dL (ref 8.4–10.5)
Chloride: 106 meq/L (ref 96–112)
Creatinine, Ser: 1.5 mg/dL — ABNORMAL HIGH (ref 0.4–1.2)
GFR: 45.67 mL/min — ABNORMAL LOW
Glucose, Bld: 119 mg/dL — ABNORMAL HIGH (ref 70–99)
Potassium: 4.3 meq/L (ref 3.5–5.1)
Sodium: 138 meq/L (ref 135–145)

## 2012-10-10 LAB — TSH: TSH: 0.3 u[IU]/mL — ABNORMAL LOW (ref 0.35–5.50)

## 2012-10-10 LAB — VITAMIN B12: Vitamin B-12: 797 pg/mL (ref 211–911)

## 2012-10-15 NOTE — Progress Notes (Signed)
This encounter was created in error - please disregard.

## 2012-10-17 ENCOUNTER — Encounter: Payer: Self-pay | Admitting: Internal Medicine

## 2012-10-17 ENCOUNTER — Ambulatory Visit (INDEPENDENT_AMBULATORY_CARE_PROVIDER_SITE_OTHER): Payer: Medicare Other | Admitting: Internal Medicine

## 2012-10-17 VITALS — BP 90/62 | HR 76 | Temp 97.8°F | Resp 16 | Wt 258.0 lb

## 2012-10-17 DIAGNOSIS — E538 Deficiency of other specified B group vitamins: Secondary | ICD-10-CM

## 2012-10-17 DIAGNOSIS — R799 Abnormal finding of blood chemistry, unspecified: Secondary | ICD-10-CM

## 2012-10-17 DIAGNOSIS — E559 Vitamin D deficiency, unspecified: Secondary | ICD-10-CM | POA: Diagnosis not present

## 2012-10-17 DIAGNOSIS — M545 Low back pain: Secondary | ICD-10-CM

## 2012-10-17 DIAGNOSIS — R7989 Other specified abnormal findings of blood chemistry: Secondary | ICD-10-CM

## 2012-10-17 DIAGNOSIS — IMO0001 Reserved for inherently not codable concepts without codable children: Secondary | ICD-10-CM

## 2012-10-17 DIAGNOSIS — K5090 Crohn's disease, unspecified, without complications: Secondary | ICD-10-CM

## 2012-10-17 MED ORDER — HYDROCHLOROTHIAZIDE 12.5 MG PO CAPS: 12.5000 mg | ORAL_CAPSULE | Freq: Every day | ORAL | Status: DC

## 2012-10-17 MED ORDER — OXYCODONE HCL 15 MG PO TABS: 15.0000 mg | ORAL_TABLET | Freq: Four times a day (QID) | ORAL | Status: DC

## 2012-10-17 NOTE — Assessment & Plan Note (Signed)
Continue with current prescription therapy as reflected on the Med list.  

## 2012-10-17 NOTE — Assessment & Plan Note (Addendum)
D/c Benicar Start HCTZ low dose qod for swelling Labs

## 2012-10-17 NOTE — Assessment & Plan Note (Signed)
S/p colectomy

## 2012-10-17 NOTE — Progress Notes (Signed)
  Subjective:    Patient ID: Emily Sanchez, female    DOB: [birth year], 57 y.o.   MRN: [id number]  HPI  The patient presents for a follow-up of FMS, abn labs,  chronic hypertension, chronic LBP. LBP is worse. C/o obesity BP Readings from Last 3 Encounters:  10/17/12 90/62  08/31/12 90/58  08/18/12 111/64   BP Readings from Last 3 Encounters:  10/17/12 90/62  08/31/12 90/58  08/18/12 111/64      Review of Systems  Constitutional: Positive for fatigue and unexpected weight change. Negative for chills, activity change and appetite change.  HENT: Positive for postnasal drip. Negative for congestion, rhinorrhea, mouth sores, dental problem, voice change and sinus pressure.   Eyes: Negative for visual disturbance.  Respiratory: Negative for cough, chest tightness and shortness of breath.   Cardiovascular: Negative for chest pain and palpitations.  Gastrointestinal: Positive for abdominal distention. Negative for nausea, vomiting, abdominal pain, diarrhea and blood in stool.  Genitourinary: Negative for dysuria, urgency, frequency, difficulty urinating and vaginal pain.  Musculoskeletal: Positive for myalgias, back pain and arthralgias. Negative for joint swelling and gait problem.  Skin: Negative for pallor and rash.  Neurological: Negative for dizziness, tremors, weakness, numbness and headaches.  Psychiatric/Behavioral: Positive for dysphoric mood. Negative for suicidal ideas, confusion and sleep disturbance. The patient is nervous/anxious.        Objective:   Physical Exam  Constitutional: She appears well-developed. No distress.  Obese  HENT:  Head: Normocephalic.  Right Ear: External ear normal.  Left Ear: External ear normal.  Nose: Nose normal.  Mouth/Throat: Oropharynx is clear and moist.  Eyes: Conjunctivae are normal. Pupils are equal, round, and reactive to light. Right eye exhibits no discharge. Left eye exhibits no discharge. No scleral icterus.  Neck: Normal range  of motion. Neck supple. No JVD present. No tracheal deviation present. No thyromegaly present.  Cardiovascular: Normal rate, regular rhythm and normal heart sounds.   Pulmonary/Chest: No stridor. No respiratory distress. She has no wheezes.  Abdominal: Soft. Bowel sounds are normal. She exhibits no distension and no mass. There is no tenderness. There is no rebound and no guarding.  colostomy  Musculoskeletal: She exhibits tenderness. She exhibits no edema.  LS, B knees - tender  Lymphadenopathy:    She has no cervical adenopathy.  Neurological: She displays normal reflexes. No cranial nerve deficit. She exhibits normal muscle tone. Coordination normal.  Skin: No rash noted. No erythema.  Psychiatric: Her behavior is normal. Judgment and thought content normal.  sad    Lab Results  Component Value Date   WBC 5.4 08/18/2012   HGB 9.3* 08/18/2012   HCT 29.6* 08/18/2012   PLT 210 08/18/2012   GLUCOSE 119* 10/10/2012   CHOL 172 05/27/2010   TRIG 100.0 05/27/2010   HDL 46.30 05/27/2010   LDLCALC 106* 05/27/2010   ALT 14 08/15/2012   AST 13 08/15/2012   NA 138 10/10/2012   K 4.3 10/10/2012   CL 106 10/10/2012   CREATININE 1.5* 10/10/2012   BUN 42* 10/10/2012   CO2 26 10/10/2012   TSH 0.30* 10/10/2012   HGBA1C 5.9 12/17/2009         Assessment & Plan:

## 2012-10-17 NOTE — Assessment & Plan Note (Addendum)
Wt Readings from Last 3 Encounters:  10/17/12 258 lb (117.028 kg)  08/31/12 258 lb (117.028 kg)  08/16/12 264 lb (119.75 kg)   Dr Lucia Gaskins - Dx considering lap band - would she be a candidate? - s/p colectomy for Crohn's - remote

## 2012-11-07 ENCOUNTER — Other Ambulatory Visit (INDEPENDENT_AMBULATORY_CARE_PROVIDER_SITE_OTHER): Payer: Medicare Other

## 2012-11-07 DIAGNOSIS — E538 Deficiency of other specified B group vitamins: Secondary | ICD-10-CM | POA: Diagnosis not present

## 2012-11-07 DIAGNOSIS — R7989 Other specified abnormal findings of blood chemistry: Secondary | ICD-10-CM

## 2012-11-07 DIAGNOSIS — I10 Essential (primary) hypertension: Secondary | ICD-10-CM | POA: Diagnosis not present

## 2012-11-07 DIAGNOSIS — M545 Low back pain: Secondary | ICD-10-CM | POA: Diagnosis not present

## 2012-11-07 DIAGNOSIS — K5090 Crohn's disease, unspecified, without complications: Secondary | ICD-10-CM

## 2012-11-07 DIAGNOSIS — E559 Vitamin D deficiency, unspecified: Secondary | ICD-10-CM

## 2012-11-07 DIAGNOSIS — R799 Abnormal finding of blood chemistry, unspecified: Secondary | ICD-10-CM

## 2012-11-07 DIAGNOSIS — IMO0001 Reserved for inherently not codable concepts without codable children: Secondary | ICD-10-CM

## 2012-11-07 LAB — BASIC METABOLIC PANEL
BUN: 23 mg/dL (ref 6–23)
GFR: 44.64 mL/min — ABNORMAL LOW (ref >60.00)
Potassium: 4.5 mEq/L (ref 3.5–5.1)
Sodium: 136 mEq/L (ref 135–145)

## 2012-11-07 LAB — TSH: TSH: 0.55 u[IU]/mL (ref 0.35–5.50)

## 2012-11-16 ENCOUNTER — Ambulatory Visit (INDEPENDENT_AMBULATORY_CARE_PROVIDER_SITE_OTHER): Payer: Medicare Other | Admitting: Internal Medicine

## 2012-11-16 ENCOUNTER — Encounter: Payer: Self-pay | Admitting: Internal Medicine

## 2012-11-16 VITALS — BP 140/82 | HR 80 | Temp 98.2°F | Resp 16 | Wt 262.0 lb

## 2012-11-16 DIAGNOSIS — I10 Essential (primary) hypertension: Secondary | ICD-10-CM | POA: Diagnosis not present

## 2012-11-16 DIAGNOSIS — IMO0001 Reserved for inherently not codable concepts without codable children: Secondary | ICD-10-CM

## 2012-11-16 DIAGNOSIS — E538 Deficiency of other specified B group vitamins: Secondary | ICD-10-CM

## 2012-11-16 DIAGNOSIS — F411 Generalized anxiety disorder: Secondary | ICD-10-CM | POA: Diagnosis not present

## 2012-11-16 DIAGNOSIS — M545 Low back pain: Secondary | ICD-10-CM

## 2012-11-16 MED ORDER — PHENTERMINE HCL 37.5 MG PO TABS: 37.5000 mg | ORAL_TABLET | Freq: Every day | ORAL | Status: DC

## 2012-11-16 MED ORDER — CYCLOBENZAPRINE HCL 5 MG PO TABS: 5.0000 mg | ORAL_TABLET | Freq: Every evening | ORAL | Status: DC | PRN

## 2012-11-16 MED ORDER — OXYCODONE HCL 15 MG PO TABS: 15.0000 mg | ORAL_TABLET | Freq: Four times a day (QID) | ORAL | Status: DC

## 2012-11-16 NOTE — Assessment & Plan Note (Signed)
Worse sx's Pain Clinic consult Dr Lucia Estelle Continue with current prescription therapy as reflected on the Med list.

## 2012-11-16 NOTE — Assessment & Plan Note (Signed)
Refractory Potential benefits of a long term phentermine  use as well as potential risks  and complications were explained to the patient and were aknowledged. 6/14 bariatric seminar She will see a dietician Phentermine qam

## 2012-11-16 NOTE — Assessment & Plan Note (Signed)
Continue with current prescription therapy as reflected on the Med list.  

## 2012-11-16 NOTE — Progress Notes (Signed)
   Subjective:    HPI  The patient presents for a follow-up of FMS, abn labs, CRI, chronic hypertension, chronic LBP. LBP is worse. Pain is worse. Pain is controlled for 2 hrs after pain meds C/o obesity she went to a bariatric seminar She is on gluten and milk free diet   BP Readings from Last 3 Encounters:  11/16/12 140/82  10/17/12 90/62  08/31/12 90/58   Wt Readings from Last 3 Encounters:  11/16/12 262 lb (118.842 kg)  10/17/12 258 lb (117.028 kg)  08/31/12 258 lb (117.028 kg)       Review of Systems  Constitutional: Positive for fatigue and unexpected weight change. Negative for chills, activity change and appetite change.  HENT: Positive for postnasal drip. Negative for congestion, rhinorrhea, mouth sores, dental problem, voice change and sinus pressure.   Eyes: Negative for visual disturbance.  Respiratory: Negative for cough, chest tightness and shortness of breath.   Cardiovascular: Negative for chest pain and palpitations.  Gastrointestinal: Positive for abdominal distention. Negative for nausea, vomiting, abdominal pain, diarrhea and blood in stool.  Genitourinary: Negative for dysuria, urgency, frequency, difficulty urinating and vaginal pain.  Musculoskeletal: Positive for myalgias, back pain and arthralgias. Negative for joint swelling and gait problem.  Skin: Negative for pallor and rash.  Neurological: Negative for dizziness, tremors, weakness, numbness and headaches.  Psychiatric/Behavioral: Positive for dysphoric mood. Negative for suicidal ideas, confusion and sleep disturbance. The patient is nervous/anxious.        Objective:   Physical Exam  Constitutional: She appears well-developed. No distress.  Obese  HENT:  Head: Normocephalic.  Right Ear: External ear normal.  Left Ear: External ear normal.  Nose: Nose normal.  Mouth/Throat: Oropharynx is clear and moist.  Eyes: Conjunctivae are normal. Pupils are equal, round, and reactive to light.  Right eye exhibits no discharge. Left eye exhibits no discharge. No scleral icterus.  Neck: Normal range of motion. Neck supple. No JVD present. No tracheal deviation present. No thyromegaly present.  Cardiovascular: Normal rate, regular rhythm and normal heart sounds.   Pulmonary/Chest: No stridor. No respiratory distress. She has no wheezes.  Abdominal: Soft. Bowel sounds are normal. She exhibits no distension and no mass. There is no tenderness. There is no rebound and no guarding.  colostomy  Musculoskeletal: She exhibits tenderness. She exhibits no edema.  LS, B knees - tender  Lymphadenopathy:    She has no cervical adenopathy.  Neurological: She displays normal reflexes. No cranial nerve deficit. She exhibits normal muscle tone. Coordination normal.  Skin: No rash noted. No erythema.  Psychiatric: Her behavior is normal. Judgment and thought content normal.  sad    Lab Results  Component Value Date   WBC 5.4 08/18/2012   HGB 9.3* 08/18/2012   HCT 29.6* 08/18/2012   PLT 210 08/18/2012   GLUCOSE 89 11/07/2012   CHOL 172 05/27/2010   TRIG 100.0 05/27/2010   HDL 46.30 05/27/2010   LDLCALC 106* 05/27/2010   ALT 14 08/15/2012   AST 13 08/15/2012   NA 136 11/07/2012   K 4.5 11/07/2012   CL 103 11/07/2012   CREATININE 1.5* 11/07/2012   BUN 23 11/07/2012   CO2 26 11/07/2012   TSH 0.55 11/07/2012   HGBA1C 5.9 12/17/2009   A complex case Labs, tests and records reviewed       Assessment & Plan:

## 2012-11-16 NOTE — Assessment & Plan Note (Addendum)
Better  Monitoring labs/BP Nephrol consult

## 2012-11-28 ENCOUNTER — Encounter: Payer: Self-pay | Admitting: Physical Medicine & Rehabilitation

## 2012-12-26 ENCOUNTER — Ambulatory Visit (HOSPITAL_BASED_OUTPATIENT_CLINIC_OR_DEPARTMENT_OTHER): Payer: Medicare Other | Admitting: Physical Medicine & Rehabilitation

## 2012-12-26 ENCOUNTER — Encounter: Payer: Self-pay | Admitting: Physical Medicine & Rehabilitation

## 2012-12-26 ENCOUNTER — Encounter: Payer: Medicare Other | Attending: Physical Medicine & Rehabilitation

## 2012-12-26 VITALS — BP 132/78 | HR 93 | Resp 14 | Ht 65.0 in | Wt 272.4 lb

## 2012-12-26 DIAGNOSIS — M461 Sacroiliitis, not elsewhere classified: Secondary | ICD-10-CM

## 2012-12-26 DIAGNOSIS — Z5181 Encounter for therapeutic drug level monitoring: Secondary | ICD-10-CM

## 2012-12-26 DIAGNOSIS — M545 Low back pain, unspecified: Secondary | ICD-10-CM | POA: Diagnosis not present

## 2012-12-26 DIAGNOSIS — M549 Dorsalgia, unspecified: Secondary | ICD-10-CM | POA: Diagnosis not present

## 2012-12-26 DIAGNOSIS — K5090 Crohn's disease, unspecified, without complications: Secondary | ICD-10-CM

## 2012-12-26 DIAGNOSIS — K589 Irritable bowel syndrome without diarrhea: Secondary | ICD-10-CM | POA: Diagnosis not present

## 2012-12-26 DIAGNOSIS — M1990 Unspecified osteoarthritis, unspecified site: Secondary | ICD-10-CM

## 2012-12-26 DIAGNOSIS — M1710 Unilateral primary osteoarthritis, unspecified knee: Secondary | ICD-10-CM | POA: Insufficient documentation

## 2012-12-26 DIAGNOSIS — Z79899 Other long term (current) drug therapy: Secondary | ICD-10-CM

## 2012-12-26 NOTE — Progress Notes (Signed)
Subjective:    Patient ID: Emily Sanchez, female    DOB: 25-May-1955, 57 y.o.   MRN: [id number]  HPI Chief complaint is knee and back pain. 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   back pain started about 5 years ago, has knee injections perform by orthopedics.   also had back injections perform by physical medicine  Back injections were helpful for pain. Was able to take less oxycodone.   more recently he has had knee injections by orthopedics.     unable to take nonsteroidal anti-inflammatories secondary to inflammatory bowel disease Pain Inventory Average Pain 9 Pain Right Now 9 My pain is sharp, stabbing and aching  In the last 24 hours, has pain interfered with the following? General activity 9 Relation with others 7 Enjoyment of life 6 What TIME of day is your pain at its worst? night Sleep (in general) Poor  Pain is worse with: walking, bending, standing and unsure Pain improves with: medication and injections Relief from Meds: 5  Mobility walk without assistance how many minutes can you walk? not many ability to climb steps?  yes do you drive?  yes  Function disabled: date disabled 2002 I need assistance with the following:  shopping  Neuro/Psych weakness trouble walking spasms depression anxiety  Prior Studies Any changes since last visit?  no  Physicians involved in your care Any changes since last visit?  no   Family History  Problem Relation Age of Onset  . Hypertension    . Heart disease Father 28    CHF  . Depression Sister    History   Social History  . Marital Status: Married    Spouse Name: N/A    Number of Children: N/A  . Years of Education: N/A   Social History Main Topics  . Smoking status: Former Research scientist (life sciences)  . Smokeless tobacco: Never Used  . Alcohol Use: No  . Drug Use: No  . Sexual Activity: Yes   Other Topics Concern  . 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 Ileostomy    . Colectomy  2001    ulcerative colitis   Past Medical History  Diagnosis Date  . Depression   . HTN (hypertension)   . Perianal pain     Chronic, Post-Op  . 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 Ulcerative colitis     Dr Sharlett Iles  . Anxiety   . Low back pain     FMS  . GERD (gastroesophageal reflux disease)   . Osteoarthritis     Dr Alvan Dame; both knees  . Enteritis due to Clostridium difficile 08/16/2012   BP 132/78  Pulse 93  Resp 14  Ht 5' 5"  (1.651 m)  Wt 272 lb 6.4 oz (123.56 kg)  BMI 45.33 kg/m2  SpO2 98%    Review of Systems  Constitutional: Positive for unexpected weight change.  Gastrointestinal: Positive for abdominal pain.  Musculoskeletal: Positive for back pain and gait problem.       Spasms  Neurological: Positive for weakness.  Psychiatric/Behavioral: Positive for dysphoric mood. The patient is nervous/anxious.   All other systems reviewed and are negative.       Objective:   Physical Exam  Nursing note and vitals reviewed. Constitutional: She is oriented to person, place, and time. She appears well-developed.  Musculoskeletal:  Right knee: She exhibits decreased range of motion.  Crepitus r knee DTR at knee difficult due to obesity  FABERS + R>L low back  Neurological: She is alert and oriented to person, place, and time. She has normal strength. No sensory deficit. She exhibits normal muscle tone. Gait abnormal.  Antalgic gait without foot drop or knee buckling          Assessment & Plan:  1.  Low back pain in pt with inflammatory bowel disease and +FABER's suspect Sacroilitis, has responded to injections under fluoro in the past will review prior injection notes from Dr Nelva Bush, Consider SI inj vs ESI  2.  R knee OA, has responded to corticosteroid injection in past .  Last injection > 6 mo ago will repeat adn consider  Synvisc given hx of DM

## 2012-12-26 NOTE — Patient Instructions (Addendum)
Hylan G-F 20 intra-articular injection What is this medicine? HYLAN G-F 20 (HI lan G F 20) is used to treat osteoarthritis of the knee. It lubricates and cushions the joint, reducing pain in the knee. This medicine may be used for other purposes; ask your health care provider or pharmacist if you have questions. What should I tell my health care provider before I take this medicine? They need to know if you have any of these conditions: -severe knee inflammation -skin conditions or sensitivity -skin or joint infection -venous stasis -an unusual or allergic reaction to hylan G-F 20, hyaluronan (sodium hyaluronate), eggs, other medicines, foods, dyes, or preservatives -pregnant or trying to get pregnant -breast-feeding How should I use this medicine? This medicine is for injection into the knee joint. It is given by a health care professional in a hospital or clinic setting. Talk to your pediatrician regarding the use of this medicine in children. This medicine is not approved for use in children. Overdosage: If you think you've taken too much of this medicine contact a poison control center or emergency room at once. Overdosage: If you think you have taken too much of this medicine contact a poison control center or emergency room at once. NOTE: This medicine is only for you. Do not share this medicine with others. What if I miss a dose? Keep appointments for follow-up doses as directed. For Synvisc, you will need weekly injections for 3 doses. It is important not to miss your dose. If you will receive Synvisc-One, then only 1 injection will be needed. Call your doctor or health care professional if you are unable to keep an appointment. What may interact with this medicine? Do not take this medicine with any of the following medications: -other injections for the joint like steroids or anesthetics -certain skin disinfectants like benzalkonium chloride This list may not describe all possible  interactions. Give your health care provider a list of all the medicines, herbs, non-prescription drugs, or dietary supplements you use. Also tell them if you smoke, drink alcohol, or use illegal drugs. Some items may interact with your medicine. What should I watch for while using this medicine? Tell your doctor or healthcare professional if your symptoms do not start to get better or if they get worse. Your condition will be monitored carefully while you are receiving this medicine. Most persons get pain relief for up to 6 months after treatment. Avoid strenuous activities (high-impact sports, jogging) or major weight-bearing activities for 48 hours after the injection. What side effects may I notice from receiving this medicine? Side effects that you should report to your doctor or health care professional as soon as possible: -allergic reactions like skin rash, itching or hives, swelling of the face, lips, or tongue -difficulty breathing -fever or chills -severe joint pain or swelling -unusual bleeding or bruising  Side effects that usually do not require medical attention (Report these to your doctor or health care professional if they continue or are bothersome.): -dizziness -flushing -general ill feeling or flu-like symptoms -headache -minor joint pain or swelling -muscle pain or cramps -pain, redness, irritation or bruising at site of injection This list may not describe all possible side effects. Call your doctor for medical advice about side effects. You may report side effects to FDA at 1-800-FDA-1088. Where should I keep my medicine? This drug is given in a hospital or clinic and will not be stored at home. NOTE: This sheet is a summary. It may not cover all possible  information. If you have questions about this medicine, talk to your doctor, pharmacist, or health care provider.  2013, Elsevier/Gold Standard. (11/21/2009 4:39:59 PM)

## 2013-02-10 ENCOUNTER — Other Ambulatory Visit (INDEPENDENT_AMBULATORY_CARE_PROVIDER_SITE_OTHER): Payer: Medicare Other

## 2013-02-10 DIAGNOSIS — I10 Essential (primary) hypertension: Secondary | ICD-10-CM | POA: Diagnosis not present

## 2013-02-10 DIAGNOSIS — N183 Chronic kidney disease, stage 3 unspecified: Secondary | ICD-10-CM

## 2013-02-10 LAB — URINALYSIS, ROUTINE W REFLEX MICROSCOPIC
Bilirubin Urine: NEGATIVE
Hgb urine dipstick: NEGATIVE
Total Protein, Urine: NEGATIVE
Urine Glucose: NEGATIVE

## 2013-02-10 LAB — BASIC METABOLIC PANEL
BUN: 20 mg/dL (ref 6–23)
Creatinine, Ser: 1.4 mg/dL — ABNORMAL HIGH (ref 0.4–1.2)
GFR: 50.19 mL/min — ABNORMAL LOW (ref >60.00)
Potassium: 4.3 mEq/L (ref 3.5–5.1)

## 2013-02-10 LAB — MICROALBUMIN / CREATININE URINE RATIO: Microalb, Ur: 0.6 mg/dL (ref 0.0–1.9)

## 2013-02-16 ENCOUNTER — Encounter: Payer: Self-pay | Admitting: Internal Medicine

## 2013-02-16 ENCOUNTER — Ambulatory Visit (INDEPENDENT_AMBULATORY_CARE_PROVIDER_SITE_OTHER): Payer: Medicare Other | Admitting: Internal Medicine

## 2013-02-16 DIAGNOSIS — I10 Essential (primary) hypertension: Secondary | ICD-10-CM

## 2013-02-16 DIAGNOSIS — N189 Chronic kidney disease, unspecified: Secondary | ICD-10-CM | POA: Diagnosis not present

## 2013-02-16 DIAGNOSIS — E538 Deficiency of other specified B group vitamins: Secondary | ICD-10-CM

## 2013-02-16 DIAGNOSIS — Z23 Encounter for immunization: Secondary | ICD-10-CM

## 2013-02-16 DIAGNOSIS — IMO0001 Reserved for inherently not codable concepts without codable children: Secondary | ICD-10-CM

## 2013-02-16 MED ORDER — OXYCODONE HCL 15 MG PO TABS: 15.0000 mg | ORAL_TABLET | Freq: Four times a day (QID) | ORAL | Status: DC

## 2013-02-16 NOTE — Assessment & Plan Note (Signed)
Nephrology ref in progress

## 2013-02-16 NOTE — Assessment & Plan Note (Signed)
Continue with current prescription therapy as reflected on the Med list.  

## 2013-02-16 NOTE — Progress Notes (Signed)
   Subjective:    HPI  The patient presents for a follow-up of FMS, abn labs, CRI, chronic hypertension, chronic LBP. LBP is worse. Pain is worse. Pain is controlled for 2 hrs after pain meds C/o obesity she went to a bariatric seminar She is on gluten and milk free diet   BP Readings from Last 3 Encounters:  02/16/13 122/80  12/26/12 132/78  11/16/12 140/82   Wt Readings from Last 3 Encounters:  02/16/13 267 lb (121.11 kg)  12/26/12 272 lb 6.4 oz (123.56 kg)  11/16/12 262 lb (118.842 kg)       Review of Systems  Constitutional: Positive for fatigue and unexpected weight change. Negative for chills, activity change and appetite change.  HENT: Positive for postnasal drip. Negative for congestion, dental problem, mouth sores, rhinorrhea, sinus pressure and voice change.   Eyes: Negative for visual disturbance.  Respiratory: Negative for cough, chest tightness and shortness of breath.   Cardiovascular: Negative for chest pain and palpitations.  Gastrointestinal: Positive for abdominal distention. Negative for nausea, vomiting, abdominal pain, diarrhea and blood in stool.  Genitourinary: Negative for dysuria, urgency, frequency, difficulty urinating and vaginal pain.  Musculoskeletal: Positive for arthralgias, back pain and myalgias. Negative for gait problem and joint swelling.  Skin: Negative for pallor and rash.  Neurological: Negative for dizziness, tremors, weakness, numbness and headaches.  Psychiatric/Behavioral: Positive for dysphoric mood. Negative for suicidal ideas, confusion and sleep disturbance. The patient is nervous/anxious.        Objective:   Physical Exam  Constitutional: She appears well-developed. No distress.  Obese  HENT:  Head: Normocephalic.  Right Ear: External ear normal.  Left Ear: External ear normal.  Nose: Nose normal.  Mouth/Throat: Oropharynx is clear and moist.  Eyes: Conjunctivae are normal. Pupils are equal, round, and reactive to  light. Right eye exhibits no discharge. Left eye exhibits no discharge. No scleral icterus.  Neck: Normal range of motion. Neck supple. No JVD present. No tracheal deviation present. No thyromegaly present.  Cardiovascular: Normal rate, regular rhythm and normal heart sounds.   Pulmonary/Chest: No stridor. No respiratory distress. She has no wheezes.  Abdominal: Soft. Bowel sounds are normal. She exhibits no distension and no mass. There is no tenderness. There is no rebound and no guarding.  colostomy  Musculoskeletal: She exhibits tenderness. She exhibits no edema.  LS, B knees - tender  Lymphadenopathy:    She has no cervical adenopathy.  Neurological: She displays normal reflexes. No cranial nerve deficit. She exhibits normal muscle tone. Coordination normal.  Skin: No rash noted. No erythema.  Psychiatric: Her behavior is normal. Judgment and thought content normal.  sad    Lab Results  Component Value Date   WBC 5.4 08/18/2012   HGB 9.3* 08/18/2012   HCT 29.6* 08/18/2012   PLT 210 08/18/2012   GLUCOSE 83 02/10/2013   CHOL 172 05/27/2010   TRIG 100.0 05/27/2010   HDL 46.30 05/27/2010   LDLCALC 106* 05/27/2010   ALT 14 08/15/2012   AST 13 08/15/2012   NA 138 02/10/2013   K 4.3 02/10/2013   CL 104 02/10/2013   CREATININE 1.4* 02/10/2013   BUN 20 02/10/2013   CO2 24 02/10/2013   TSH 0.55 11/07/2012   HGBA1C 5.9 12/17/2009   MICROALBUR 0.6 02/10/2013   A complex case  Labs, tests and records reviewed       Assessment & Plan:

## 2013-02-16 NOTE — Assessment & Plan Note (Signed)
Better Wt Readings from Last 3 Encounters:  02/16/13 267 lb (121.11 kg)  12/26/12 272 lb 6.4 oz (123.56 kg)  11/16/12 262 lb (118.842 kg)

## 2013-03-28 DIAGNOSIS — N179 Acute kidney failure, unspecified: Secondary | ICD-10-CM | POA: Diagnosis not present

## 2013-03-28 DIAGNOSIS — I10 Essential (primary) hypertension: Secondary | ICD-10-CM | POA: Diagnosis not present

## 2013-03-28 DIAGNOSIS — D649 Anemia, unspecified: Secondary | ICD-10-CM | POA: Diagnosis not present

## 2013-04-07 ENCOUNTER — Other Ambulatory Visit (HOSPITAL_COMMUNITY): Payer: Self-pay | Admitting: *Deleted

## 2013-04-10 ENCOUNTER — Encounter (HOSPITAL_COMMUNITY)
Admission: RE | Admit: 2013-04-10 | Discharge: 2013-04-10 | Disposition: A | Discharge status: Home / Self Care | Payer: Medicare Other | Source: Ambulatory Visit | Attending: Nephrology | Admitting: Nephrology

## 2013-04-10 DIAGNOSIS — N183 Chronic kidney disease, stage 3 unspecified: Secondary | ICD-10-CM | POA: Insufficient documentation

## 2013-04-10 DIAGNOSIS — N179 Acute kidney failure, unspecified: Secondary | ICD-10-CM | POA: Diagnosis not present

## 2013-04-10 DIAGNOSIS — D649 Anemia, unspecified: Secondary | ICD-10-CM | POA: Diagnosis not present

## 2013-04-10 MED ORDER — SODIUM CHLORIDE 0.9 % IV SOLN
INTRAVENOUS | Status: DC
  Administered 2013-04-10: 11:00:00 via INTRAVENOUS

## 2013-04-10 MED ORDER — FERUMOXYTOL INJECTION 510 MG/17 ML
INTRAVENOUS | Status: AC
  Administered 2013-04-10: 510 mg
  Filled 2013-04-10: qty 17

## 2013-04-10 MED ORDER — FERUMOXYTOL INJECTION 510 MG/17 ML: 510.0000 mg | Freq: Once | INTRAVENOUS | Status: DC

## 2013-04-14 ENCOUNTER — Other Ambulatory Visit: Payer: Self-pay | Admitting: Internal Medicine

## 2013-05-10 DIAGNOSIS — D649 Anemia, unspecified: Secondary | ICD-10-CM | POA: Diagnosis not present

## 2013-05-15 ENCOUNTER — Encounter: Payer: Self-pay | Admitting: Internal Medicine

## 2013-05-15 ENCOUNTER — Ambulatory Visit (INDEPENDENT_AMBULATORY_CARE_PROVIDER_SITE_OTHER): Payer: Medicare Other | Admitting: Internal Medicine

## 2013-05-15 VITALS — BP 130/90 | HR 80 | Temp 98.3°F | Resp 16 | Wt 264.0 lb

## 2013-05-15 DIAGNOSIS — E538 Deficiency of other specified B group vitamins: Secondary | ICD-10-CM

## 2013-05-15 DIAGNOSIS — D638 Anemia in other chronic diseases classified elsewhere: Secondary | ICD-10-CM | POA: Diagnosis not present

## 2013-05-15 DIAGNOSIS — G4700 Insomnia, unspecified: Secondary | ICD-10-CM

## 2013-05-15 DIAGNOSIS — F329 Major depressive disorder, single episode, unspecified: Secondary | ICD-10-CM

## 2013-05-15 DIAGNOSIS — I10 Essential (primary) hypertension: Secondary | ICD-10-CM

## 2013-05-15 DIAGNOSIS — M545 Low back pain, unspecified: Secondary | ICD-10-CM

## 2013-05-15 DIAGNOSIS — F3289 Other specified depressive episodes: Secondary | ICD-10-CM

## 2013-05-15 DIAGNOSIS — K5090 Crohn's disease, unspecified, without complications: Secondary | ICD-10-CM | POA: Diagnosis not present

## 2013-05-15 DIAGNOSIS — K130 Diseases of lips: Secondary | ICD-10-CM | POA: Diagnosis not present

## 2013-05-15 DIAGNOSIS — Z23 Encounter for immunization: Secondary | ICD-10-CM

## 2013-05-15 DIAGNOSIS — IMO0001 Reserved for inherently not codable concepts without codable children: Secondary | ICD-10-CM

## 2013-05-15 MED ORDER — CYANOCOBALAMIN 1000 MCG/ML IJ SOLN: 1000.0000 ug | INTRAMUSCULAR | Status: DC

## 2013-05-15 MED ORDER — OXYCODONE HCL 15 MG PO TABS: 15.0000 mg | ORAL_TABLET | Freq: Four times a day (QID) | ORAL | Status: DC

## 2013-05-15 MED ORDER — PAROXETINE HCL 10 MG PO TABS: 10.0000 mg | ORAL_TABLET | Freq: Every day | ORAL | Status: DC

## 2013-05-15 NOTE — Assessment & Plan Note (Signed)
Continue with current prescription therapy as reflected on the Med list.  

## 2013-05-15 NOTE — Progress Notes (Signed)
Pre visit review using our clinic review tool, if applicable. No additional management support is needed unless otherwise documented below in the visit note. 

## 2013-05-15 NOTE — Progress Notes (Signed)
   Subjective:    HPI  The patient presents for a follow-up of FMS, abn labs, CRI, chronic hypertension, chronic LBP. LBP is worse. Pain is worse. Pain is controlled for 2 hrs after pain meds F/u obesity she went to a bariatric seminar She is on gluten and milk free diet S/p Colectomy - 2001   BP Readings from Last 3 Encounters:  05/15/13 130/90  04/10/13 137/89  02/16/13 122/80   Wt Readings from Last 3 Encounters:  05/15/13 264 lb (119.75 kg)  04/10/13 268 lb (121.564 kg)  02/16/13 267 lb (121.11 kg)       Review of Systems  Constitutional: Positive for fatigue and unexpected weight change. Negative for chills, activity change and appetite change.  HENT: Positive for postnasal drip. Negative for congestion, dental problem, mouth sores, rhinorrhea, sinus pressure and voice change.   Eyes: Negative for visual disturbance.  Respiratory: Negative for cough, chest tightness and shortness of breath.   Cardiovascular: Negative for chest pain and palpitations.  Gastrointestinal: Positive for abdominal distention. Negative for nausea, vomiting, abdominal pain, diarrhea and blood in stool.  Genitourinary: Negative for dysuria, urgency, frequency, difficulty urinating and vaginal pain.  Musculoskeletal: Positive for arthralgias, back pain and myalgias. Negative for gait problem and joint swelling.  Skin: Negative for pallor and rash.  Neurological: Negative for dizziness, tremors, weakness, numbness and headaches.  Psychiatric/Behavioral: Positive for dysphoric mood. Negative for suicidal ideas, confusion and sleep disturbance. The patient is nervous/anxious.        Objective:   Physical Exam  Constitutional: She appears well-developed. No distress.  Obese  HENT:  Head: Normocephalic.  Right Ear: External ear normal.  Left Ear: External ear normal.  Nose: Nose normal.  Mouth/Throat: Oropharynx is clear and moist.  Eyes: Conjunctivae are normal. Pupils are equal, round, and  reactive to light. Right eye exhibits no discharge. Left eye exhibits no discharge. No scleral icterus.  Neck: Normal range of motion. Neck supple. No JVD present. No tracheal deviation present. No thyromegaly present.  Cardiovascular: Normal rate, regular rhythm and normal heart sounds.   Pulmonary/Chest: No stridor. No respiratory distress. She has no wheezes.  Abdominal: Soft. Bowel sounds are normal. She exhibits no distension and no mass. There is no tenderness. There is no rebound and no guarding.  colostomy  Musculoskeletal: She exhibits tenderness. She exhibits no edema.  LS, B knees - tender  Lymphadenopathy:    She has no cervical adenopathy.  Neurological: She displays normal reflexes. No cranial nerve deficit. She exhibits normal muscle tone. Coordination normal.  Skin: No rash noted. No erythema.  Psychiatric: Her behavior is normal. Judgment and thought content normal.  sad  pouch  Lab Results  Component Value Date   WBC 5.4 08/18/2012   HGB 9.3* 08/18/2012   HCT 29.6* 08/18/2012   PLT 210 08/18/2012   GLUCOSE 83 02/10/2013   CHOL 172 05/27/2010   TRIG 100.0 05/27/2010   HDL 46.30 05/27/2010   LDLCALC 106* 05/27/2010   ALT 14 08/15/2012   AST 13 08/15/2012   NA 138 02/10/2013   K 4.3 02/10/2013   CL 104 02/10/2013   CREATININE 1.4* 02/10/2013   BUN 20 02/10/2013   CO2 24 02/10/2013   TSH 0.55 11/07/2012   HGBA1C 5.9 12/17/2009   MICROALBUR 0.6 02/10/2013    Labs, tests and records reviewed       Assessment & Plan:

## 2013-05-15 NOTE — Assessment & Plan Note (Signed)
Paxil trial

## 2013-05-15 NOTE — Assessment & Plan Note (Signed)
Start Paxil at hs

## 2013-05-15 NOTE — Assessment & Plan Note (Signed)
Better  

## 2013-05-24 ENCOUNTER — Ambulatory Visit: Payer: Medicare Other | Admitting: Internal Medicine

## 2013-06-01 DIAGNOSIS — H52229 Regular astigmatism, unspecified eye: Secondary | ICD-10-CM | POA: Diagnosis not present

## 2013-06-01 DIAGNOSIS — H5210 Myopia, unspecified eye: Secondary | ICD-10-CM | POA: Diagnosis not present

## 2013-06-01 DIAGNOSIS — H524 Presbyopia: Secondary | ICD-10-CM | POA: Diagnosis not present

## 2013-06-01 DIAGNOSIS — H269 Unspecified cataract: Secondary | ICD-10-CM | POA: Diagnosis not present

## 2013-06-02 DIAGNOSIS — M25569 Pain in unspecified knee: Secondary | ICD-10-CM | POA: Diagnosis not present

## 2013-07-26 ENCOUNTER — Telehealth: Payer: Self-pay | Admitting: Internal Medicine

## 2013-07-26 MED ORDER — BENZONATATE 200 MG PO CAPS: 200.0000 mg | ORAL_CAPSULE | Freq: Three times a day (TID) | ORAL | Status: DC | PRN

## 2013-07-26 NOTE — Telephone Encounter (Signed)
The patient called back wanting to check the status of the previous phone note.   Thanks!

## 2013-07-26 NOTE — Telephone Encounter (Signed)
Patient states that for the last two weeks she has been experiencing a severe cough at night. She says that she is constantly drinking fluids but still has a cough and it keeps her awake. Wants to know if she can have something sent to [facility] on [address]. Please advise.

## 2013-07-26 NOTE — Telephone Encounter (Signed)
Will email Tessalon Thx

## 2013-08-10 DIAGNOSIS — Z0279 Encounter for issue of other medical certificate: Secondary | ICD-10-CM

## 2013-08-11 ENCOUNTER — Other Ambulatory Visit (INDEPENDENT_AMBULATORY_CARE_PROVIDER_SITE_OTHER): Payer: Medicare Other

## 2013-08-11 DIAGNOSIS — N189 Chronic kidney disease, unspecified: Secondary | ICD-10-CM

## 2013-08-11 LAB — BASIC METABOLIC PANEL
BUN: 20 mg/dL (ref 6–23)
CALCIUM: 9.7 mg/dL (ref 8.4–10.5)
CO2: 28 mEq/L (ref 19–32)
CREATININE: 1.2 mg/dL (ref 0.4–1.2)
Chloride: 104 mEq/L (ref 96–112)
GFR: 59.94 mL/min — ABNORMAL LOW (ref >60.00)
Glucose, Bld: 105 mg/dL — ABNORMAL HIGH (ref 70–99)
Potassium: 3.8 mEq/L (ref 3.5–5.1)
Sodium: 139 mEq/L (ref 135–145)

## 2013-08-14 ENCOUNTER — Encounter: Payer: Self-pay | Admitting: Internal Medicine

## 2013-08-15 ENCOUNTER — Ambulatory Visit (INDEPENDENT_AMBULATORY_CARE_PROVIDER_SITE_OTHER): Payer: Medicare Other | Admitting: Internal Medicine

## 2013-08-15 ENCOUNTER — Encounter: Payer: Self-pay | Admitting: Internal Medicine

## 2013-08-15 VITALS — BP 138/78 | HR 94 | Temp 97.6°F | Ht 64.0 in | Wt 260.0 lb

## 2013-08-15 DIAGNOSIS — Z79899 Other long term (current) drug therapy: Secondary | ICD-10-CM | POA: Diagnosis not present

## 2013-08-15 DIAGNOSIS — M545 Low back pain, unspecified: Secondary | ICD-10-CM | POA: Diagnosis not present

## 2013-08-15 DIAGNOSIS — R635 Abnormal weight gain: Secondary | ICD-10-CM

## 2013-08-15 DIAGNOSIS — E538 Deficiency of other specified B group vitamins: Secondary | ICD-10-CM

## 2013-08-15 DIAGNOSIS — F410 Panic disorder [episodic paroxysmal anxiety] without agoraphobia: Secondary | ICD-10-CM

## 2013-08-15 DIAGNOSIS — N189 Chronic kidney disease, unspecified: Secondary | ICD-10-CM

## 2013-08-15 DIAGNOSIS — D638 Anemia in other chronic diseases classified elsewhere: Secondary | ICD-10-CM

## 2013-08-15 MED ORDER — CYCLOBENZAPRINE HCL 5 MG PO TABS: 5.0000 mg | ORAL_TABLET | Freq: Every evening | ORAL | Status: DC | PRN

## 2013-08-15 MED ORDER — OXYCODONE HCL 15 MG PO TABS: 15.0000 mg | ORAL_TABLET | Freq: Four times a day (QID) | ORAL | Status: DC

## 2013-08-15 MED ORDER — PHENTERMINE HCL 37.5 MG PO TABS: ORAL_TABLET | ORAL | Status: DC

## 2013-08-15 NOTE — Progress Notes (Signed)
Pre visit review using our clinic review tool, if applicable. No additional management support is needed unless otherwise documented below in the visit note. 

## 2013-08-15 NOTE — Assessment & Plan Note (Signed)
Continue with current prescription therapy as reflected on the Med list.  

## 2013-08-15 NOTE — Assessment & Plan Note (Signed)
Better  

## 2013-08-15 NOTE — Assessment & Plan Note (Signed)
Labs

## 2013-08-15 NOTE — Assessment & Plan Note (Signed)
Continue with current prn prescription therapy as reflected on the Med list.  

## 2013-08-15 NOTE — Progress Notes (Signed)
   Subjective:    HPI  The patient presents for a follow-up of FMS, abn labs, CRI, chronic hypertension, chronic LBP. LBP is worse. Pain is worse. Pain is controlled for 2 hrs after pain meds F/u obesity she went to a bariatric seminar She is on gluten and milk free diet S/p Colectomy - 2001   BP Readings from Last 3 Encounters:  08/15/13 138/78  05/15/13 130/90  04/10/13 137/89   Wt Readings from Last 3 Encounters:  08/15/13 260 lb (117.935 kg)  05/15/13 264 lb (119.75 kg)  04/10/13 268 lb (121.564 kg)       Review of Systems  Constitutional: Positive for fatigue and unexpected weight change. Negative for chills, activity change and appetite change.  HENT: Positive for postnasal drip. Negative for congestion, dental problem, mouth sores, rhinorrhea, sinus pressure and voice change.   Eyes: Negative for visual disturbance.  Respiratory: Negative for cough, chest tightness and shortness of breath.   Cardiovascular: Negative for chest pain and palpitations.  Gastrointestinal: Positive for abdominal distention. Negative for nausea, vomiting, abdominal pain, diarrhea and blood in stool.  Genitourinary: Negative for dysuria, urgency, frequency, difficulty urinating and vaginal pain.  Musculoskeletal: Positive for arthralgias, back pain and myalgias. Negative for gait problem and joint swelling.  Skin: Negative for pallor and rash.  Neurological: Negative for dizziness, tremors, weakness, numbness and headaches.  Psychiatric/Behavioral: Positive for dysphoric mood. Negative for suicidal ideas, confusion and sleep disturbance. The patient is nervous/anxious.        Objective:   Physical Exam  Constitutional: She appears well-developed. No distress.  Obese  HENT:  Head: Normocephalic.  Right Ear: External ear normal.  Left Ear: External ear normal.  Nose: Nose normal.  Mouth/Throat: Oropharynx is clear and moist.  Eyes: Conjunctivae are normal. Pupils are equal, round, and  reactive to light. Right eye exhibits no discharge. Left eye exhibits no discharge. No scleral icterus.  Neck: Normal range of motion. Neck supple. No JVD present. No tracheal deviation present. No thyromegaly present.  Cardiovascular: Normal rate, regular rhythm and normal heart sounds.   Pulmonary/Chest: No stridor. No respiratory distress. She has no wheezes.  Abdominal: Soft. Bowel sounds are normal. She exhibits no distension and no mass. There is no tenderness. There is no rebound and no guarding.  colostomy  Musculoskeletal: She exhibits tenderness. She exhibits no edema.  LS, B knees - tender  Lymphadenopathy:    She has no cervical adenopathy.  Neurological: She displays normal reflexes. No cranial nerve deficit. She exhibits normal muscle tone. Coordination normal.  Skin: No rash noted. No erythema.  Psychiatric: Her behavior is normal. Judgment and thought content normal.  sad  pouch  Lab Results  Component Value Date   WBC 5.4 08/18/2012   HGB 9.3* 08/18/2012   HCT 29.6* 08/18/2012   PLT 210 08/18/2012   GLUCOSE 105* 08/11/2013   CHOL 172 05/27/2010   TRIG 100.0 05/27/2010   HDL 46.30 05/27/2010   LDLCALC 106* 05/27/2010   ALT 14 08/15/2012   AST 13 08/15/2012   NA 139 08/11/2013   K 3.8 08/11/2013   CL 104 08/11/2013   CREATININE 1.2 08/11/2013   BUN 20 08/11/2013   CO2 28 08/11/2013   TSH 0.55 11/07/2012   HGBA1C 5.9 12/17/2009   MICROALBUR 0.6 02/10/2013    Labs, tests and records reviewed       Assessment & Plan:

## 2013-09-06 DIAGNOSIS — Z0279 Encounter for issue of other medical certificate: Secondary | ICD-10-CM

## 2013-09-11 ENCOUNTER — Encounter: Payer: Self-pay | Admitting: Internal Medicine

## 2013-09-14 ENCOUNTER — Telehealth (INDEPENDENT_AMBULATORY_CARE_PROVIDER_SITE_OTHER): Payer: Self-pay | Admitting: *Deleted

## 2013-09-14 ENCOUNTER — Ambulatory Visit (INDEPENDENT_AMBULATORY_CARE_PROVIDER_SITE_OTHER): Payer: Medicare Other | Admitting: Surgery

## 2013-09-14 ENCOUNTER — Encounter (INDEPENDENT_AMBULATORY_CARE_PROVIDER_SITE_OTHER): Payer: Self-pay | Admitting: Surgery

## 2013-09-14 DIAGNOSIS — K439 Ventral hernia without obstruction or gangrene: Secondary | ICD-10-CM | POA: Diagnosis not present

## 2013-09-14 NOTE — Telephone Encounter (Signed)
LM for pt regarding an apt for CT.  Please advise pt it is scheduled at [facility] 09/21/13 and to arrive at 10:45 a.m.  Pt needs to pick up her contrast at [facility] a couple days before her appt. Please advise pt she needs to drink the 1st bottle at 9:00 a.m. And 2nd bottle at 10:00 a.m.  Thanks!  Anderson Malta

## 2013-09-14 NOTE — Patient Instructions (Addendum)
Sleeve Gastrectomy A sleeve gastrectomy is a surgery in which a large portion of the stomach is removed. After the surgery, the stomach will be a narrow tube about the size of a banana. This surgery is performed to help a person lose weight. The person loses weight because the reduced size of the stomach restricts the amount of food that the person can eat. The stomach will hold much less food than before the surgery. Also, the part of the stomach that is removed produces a hormone that causes hunger.  This surgery is done for people who have morbid obesity, defined as a body mass index (BMI) greater than 40. BMI is an estimate of body fat and is calculated from the height and weight of a person. This surgery may also be done for people with a BMI between 35 and 40 if they have other diseases, such as type 2 diabetes mellitus, obstructive sleep apnea, or heart and lung disorders (cardiopulmonary diseases).  LET [facility] CARE PROVIDER KNOW ABOUT:  Any allergies you have.   All medicines you are taking, including vitamins, herbs, eyedrops, creams, and over-the-counter medicines.   Use of steroids (by mouth or creams).   Previous problems you or members of your family have had with the use of anesthetics.   Any blood disorders you have.   Previous surgeries you have had.   Possibility of pregnancy, if this applies.   Other health problems you have. RISKS AND COMPLICATIONS Generally, sleeve gastrectomy is a safe procedure. However, as with any procedure, complications can occur. Possible complications include:  Infection.  Bleeding.  Blood clots.  Damage to other organs or tissue.  Leakage of fluid from the stomach into the abdominal cavity (rare). BEFORE THE PROCEDURE  You may need to have blood tests and imaging tests (such as X-rays or ultrasonography) done before the day of surgery. A test to evaluate your esophagus and how it moves (esophageal manometry) may also be  done.  You may be placed on a liquid diet 2 3 weeks before the surgery.  Ask your health care provider about changing or stopping your regular medicines.  Do not eat or drink anything for at least 8 hours before the procedure.   Make plans to have someone drive you home after your hospital stay. Also arrange for someone to help you with activities during recovery. PROCEDURE  A laparoscopic technique is usually used for this surgery:  You will be given medicine to make you sleep through the procedure (general anesthetic). This medicine will be given through an intravenous (IV) access tube that is put into one of your veins.  Once you are asleep, your abdomen will be cleaned and sterilized.  Several small incisions will be made in your abdomen.  Your abdomen will be filled with air so that it expands. This gives the surgeon more room to operate and makes your organs easier to see.  A thin, lighted tube with a tiny camera on the end (laparoscope) is put through a small incision in your abdomen. The camera on the laparoscope sends a picture to a TV screen in the operating room. This gives the surgeon a good view inside the abdomen.  Hollow tubes are put through the other small incisions in your abdomen. The tools needed for the procedure are put through these tubes.  The surgeon uses staples to divide part of the stomach and then removes it through one of the incisions.  The remaining stomach may be reinforced using  stitches or surgical glue or both to prevent leakage of the stomach contents. A small tube (drain) may be placed through one of the incisions to allow extra fluid to flow from the area.  The incisions are closed with stitches, staples, or glue. AFTER THE PROCEDURE  You will be monitored closely in a recovery area. Once the anesthetic has worn off, you will likely be moved to a regular hospital room.  You will be given medicine for pain and nausea.   You may have a drain  from one of the incisions in your abdomen. If a drain is used, it may stay in place after you go home from the hospital and be removed at a follow-up appointment.   You will be encouraged to walk around several times a day. This helps prevent blood clots.  You will be started on a liquid diet the first day after your surgery. Sometimes a test is done to check for leaking before you can eat.  You will be urged to cough and do deep breathing exercises. This helps prevent a lung infection after a surgery.  You will likely need to stay in the hospital for a few days.  Document Released: 02/01/2009 Document Revised: 12/07/2012 Document Reviewed: 08/19/2012 [facility] Patient Information 2014 [place]. Sleeve Gastrectomy, Care After Refer to this sheet in the next few weeks. These instructions provide you with information on caring for yourself after your procedure. Your surgeon may also give you more specific instructions. Your treatment has been planned according to current medical practices, but problems sometimes occur. Call your surgeon if you have any problems or questions after your procedure. HOME CARE INSTRUCTIONS  Get plenty of rest, but move around frequently for short periods or take short walks as directed by your surgeon. Increase your activities gradually.  Only take over-the-counter or prescription medicines as directed by your surgeon.  Keep incision areas clean and dry. Remove or change any bandages (dressings) only as directed by your surgeon. You may have skin adhesive strips or glue over the incision areas. Do not take the strips or the glue off. They will fall off on their own.  Check your incisions and surrounding areas daily for any redness, swelling, or drainage of fluid.  Take showers once your surgeon approves. Until then, only take sponge baths. Pat incisions dry. Do not rub incisions with a washcloth or towel. Do not take tub baths or go swimming until your  surgeon approves. Do not put anything on your incision to clean it unless directed to do so by your surgeon.  Limit activities as directed by your surgeon. You will need to avoid strenuous activity, heavy lifting, and pushing or pulling things with your arms for several weeks. Do not lift anything heavier than 10 lb (4.5 kg).  Perform deep breathing exercises and coughing as directed by your surgeon. This helps prevent a lung infection.  Do not drive until your surgeon approves.  Follow all of the dietary instructions provided by your surgeon or dietitian. You will receive specific instructions on the type, size, and timing of meals.      You may need to stay on a liquid diet for some time after the surgery.  Drink fluids frequently. You should drink 1 oz of fluid as often as you can.  Take vitamin, calcium, and protein supplements as directed by your surgeon.  If you have a drain from the incision area, make sure you:      Keep the area  of the drain clean and dry.  Empty the drain and record the amount of fluid daily.  Talk with your surgeon about when you may return to work and about your exercise routine.   Keep all follow-up appointments with your surgeon and dietitian. SEEK MEDICAL CARE IF:  Your pain is not controlled with medicine.  You have a fever.  You have shaking chills.  You notice any redness, skin irritation, swelling, or drainage of fluid (other than light red) in the incision area.  Your drain gets pulled out accidentally.   Your drain contains bright red blood, green fluid, or fluid that has a foul smell. SEEK IMMEDIATE MEDICAL CARE IF:  You have difficulty breathing.  You have severe calf pain. MAKE SURE YOU:  Understand these instructions.  Will watch your condition.  Will get help right away if you are not doing well or get worse. Document Released: 01/31/2009 Document Revised: 12/07/2012 Document Reviewed: 08/19/2012 [facility] Patient  Information 2014 [place].

## 2013-09-14 NOTE — Progress Notes (Signed)
Chief Complaint:  Am I a candidate for bariatric surgery  History of Present Illness:  Emily Sanchez is an 58 y.o. female followed by Dr. Alain Marion who has had a prior total proctocolectomy and pouch by Bruce Donath.  She has a long midline incision and probably a hernia in the left upper quadrant.   I think that she might be a candidate for a sleeve gastrectomy.    Will get CT scan and see back for further discussion before embarking on bariatric surgery path.   Past Medical History  Diagnosis Date  . Depression   . HTN (hypertension)   . Perianal pain     Chronic, Post-Op  . 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 Ulcerative colitis     Dr Sharlett Iles  . Anxiety   . Low back pain     FMS  . GERD (gastroesophageal reflux disease)   . Osteoarthritis     Dr Alvan Dame; both knees  . Enteritis due to Clostridium difficile 08/16/2012  . Crohn's disease     Past Surgical History  Procedure Laterality Date  . Ileostomy    . Colectomy  2001    ulcerative colitis    Current Outpatient Prescriptions  Medication Sig Dispense Refill  . bifidobacterium infantis (ALIGN) capsule Take 1 capsule by mouth daily.        . Cholecalciferol 1000 UNITS tablet Take 3,000 Units by mouth daily.        . cyanocobalamin (,VITAMIN B-12,) 1000 MCG/ML injection Inject 1 mL (1,000 mcg total) into the muscle every 30 (thirty) days.  10 mL  11  . cyclobenzaprine (FLEXERIL) 5 MG tablet Take 1-2 tablets (5-10 mg total) by mouth at bedtime and may repeat dose one time if needed.  60 tablet  2  . hydrochlorothiazide (MICROZIDE) 12.5 MG capsule Take 1 capsule (12.5 mg total) by mouth daily.  30 capsule  11  . oxyCODONE (ROXICODONE) 15 MG immediate release tablet Take 1 tablet (15 mg total) by mouth 4 (four) times daily. Please fill on or after  10/16/13  120 tablet  0  . PARoxetine (PAXIL) 10 MG tablet Take 1 tablet (10 mg total) by mouth at bedtime.  30 tablet  5  . phentermine (ADIPEX-P) 37.5 MG tablet TAKE 1 TABLET  BY MOUTH DAILY BEFORE BREAKFAST  30 tablet  2  . acidophilus (RISAQUAD) CAPS Take 1 capsule by mouth daily.  30 capsule  1   No current facility-administered medications for this visit.   Amphetamine-dextroamphet er; Cymbalta; Erythromycin ethylsuccinate; and Morphine Family History  Problem Relation Age of Onset  . Hypertension    . Heart disease Father 37    CHF  . Depression Sister    Social History:   reports that she has quit smoking. She has never used smokeless tobacco. She reports that she does not drink alcohol or use illicit drugs.   REVIEW OF SYSTEMS - PERTINENT POSITIVES ONLY: Multi positive  Physical Exam:   Blood pressure 132/88, pulse 98, temperature 98.3 F (36.8 C), resp. rate 16, height 5' 4"  (1.626 m), weight 262 lb 9.6 oz (119.115 kg). Body mass index is 45.05 kg/(m^2).  Gen:  WDWN AAF NAD  Neurological: Alert and oriented to person, place, and time. Motor and sensory function is grossly intact  Head: Normocephalic and atraumatic.  Eyes: Conjunctivae are normal. Pupils are equal, round, and reactive to light. No scleral icterus.  Neck: Normal range of motion. Neck supple. No tracheal deviation or thyromegaly present.  Cardiovascular:  SR without murmurs or gallops.  No carotid bruits Respiratory: Effort normal.  No respiratory distress. No chest wall tenderness. Breath sounds normal.  No wheezes, rales or rhonchi.  Abdomen:  Ileostomy in the right lower quadrant.  Midline incision with soft bulging mass to the left of midline GU: Musculoskeletal: Normal range of motion. Extremities are nontender. No cyanosis, edema or clubbing noted Lymphadenopathy: No cervical, preauricular, postauricular or axillary adenopathy is present Skin: Skin is warm and dry. No rash noted. No diaphoresis. No erythema. No pallor. Pscyh: Normal mood and affect. Behavior is normal. Judgment and thought content normal.   LABORATORY RESULTS: No results found for this or any previous visit  (from the past 48 hour(s)).  RADIOLOGY RESULTS: No results found.  Problem List: Patient Active Problem List   Diagnosis Date Noted  . Elevated serum creatinine 10/17/2012  . ARF (acute renal failure) 08/16/2012  . Dehydration 08/16/2012  . Enteritis due to Clostridium difficile 08/16/2012  . Nausea and vomiting 08/16/2012  . Angular stomatitis 03/30/2012  . Anemia, chronic disease 04/08/2011  . CRI (chronic renal insufficiency) 04/08/2011  . GRIEF REACTION 10/23/2009  . OSTEOARTHRITIS 06/18/2009  . KNEE PAIN 06/18/2009  . RUQ PAIN 02/14/2009  . PNEUMONIA 11/06/2008  . COUGH 11/06/2008  . FIBROMYALGIA 08/13/2008  . OBESITY, MORBID 01/20/2008  . INSOMNIA, PERSISTENT 10/18/2007  . FATIGUE 10/18/2007  . LEG PAIN 09/16/2007  . VITAMIN D DEFICIENCY 07/15/2007  . WEIGHT GAIN 07/15/2007  . UPPER RESPIRATORY INFECTION (URI) 04/26/2007  . ANXIETY 02/17/2007  . GERD 02/17/2007  . LOW BACK PAIN 02/17/2007  . B12 DEFICIENCY 11/08/2006  . PANIC ATTACK 11/08/2006  . DEPRESSION 11/08/2006  . HYPERTENSION 11/08/2006  . CROHN'S DISEASE 11/08/2006    Assessment & Plan: Morbid obesity in the face of inflammatory bowel disease-treated.  Will get CT scan to assess hernia and give information on sleeve gastrectomy    Matt B. Hassell Done, MD, [facility] [phone] beeper [phone]  09/14/2013 10:17 AM

## 2013-09-19 NOTE — Telephone Encounter (Signed)
Pt returned my call and she was advised of her appt below.  Pt states understanding at this time.  Anderson Malta

## 2013-09-21 ENCOUNTER — Ambulatory Visit (HOSPITAL_COMMUNITY)
Admission: RE | Admit: 2013-09-21 | Discharge: 2013-09-21 | Disposition: A | Discharge status: Home / Self Care | Payer: Medicare Other | Source: Ambulatory Visit | Attending: Surgery | Admitting: Surgery

## 2013-09-21 ENCOUNTER — Encounter (HOSPITAL_COMMUNITY): Payer: Self-pay

## 2013-09-21 DIAGNOSIS — K439 Ventral hernia without obstruction or gangrene: Secondary | ICD-10-CM | POA: Diagnosis not present

## 2013-09-21 LAB — POCT I-STAT, CHEM 8
BUN: 22 mg/dL (ref 6–23)
Calcium, Ion: 1.18 mmol/L (ref 1.12–1.23)
Chloride: 102 mEq/L (ref 96–112)
Creatinine, Ser: 1.3 mg/dL — ABNORMAL HIGH (ref 0.50–1.10)
GLUCOSE: 92 mg/dL (ref 70–99)
HCT: 39 % (ref 36.0–46.0)
HEMOGLOBIN: 13.3 g/dL (ref 12.0–15.0)
Potassium: 4.5 mEq/L (ref 3.7–5.3)
Sodium: 137 mEq/L (ref 137–147)
TCO2: 29 mmol/L (ref 0–100)

## 2013-09-21 MED ORDER — IOHEXOL 300 MG/ML  SOLN
100.0000 mL | Freq: Once | INTRAMUSCULAR | Status: AC | PRN
  Administered 2013-09-21: 100 mL via INTRAVENOUS

## 2013-09-28 ENCOUNTER — Ambulatory Visit (INDEPENDENT_AMBULATORY_CARE_PROVIDER_SITE_OTHER): Payer: Medicare Other | Admitting: Surgery

## 2013-10-12 ENCOUNTER — Encounter (INDEPENDENT_AMBULATORY_CARE_PROVIDER_SITE_OTHER): Payer: Self-pay | Admitting: Surgery

## 2013-10-12 ENCOUNTER — Ambulatory Visit (INDEPENDENT_AMBULATORY_CARE_PROVIDER_SITE_OTHER): Payer: Medicare Other | Admitting: Surgery

## 2013-10-12 VITALS — BP 150/100 | HR 98 | Temp 98.0°F | Resp 16 | Ht 64.0 in | Wt 264.2 lb

## 2013-10-12 DIAGNOSIS — K439 Ventral hernia without obstruction or gangrene: Secondary | ICD-10-CM

## 2013-10-12 NOTE — Progress Notes (Signed)
Emily Sanchez 58 y.o.  Body mass index is 45.33 kg/(m^2).  Patient Active Problem List   Diagnosis Date Noted  . Elevated serum creatinine 10/17/2012  . ARF (acute renal failure) 08/16/2012  . Dehydration 08/16/2012  . Enteritis due to Clostridium difficile 08/16/2012  . Nausea and vomiting 08/16/2012  . Angular stomatitis 03/30/2012  . Anemia, chronic disease 04/08/2011  . CRI (chronic renal insufficiency) 04/08/2011  . GRIEF REACTION 10/23/2009  . OSTEOARTHRITIS 06/18/2009  . KNEE PAIN 06/18/2009  . RUQ PAIN 02/14/2009  . PNEUMONIA 11/06/2008  . COUGH 11/06/2008  . FIBROMYALGIA 08/13/2008  . OBESITY, MORBID 01/20/2008  . INSOMNIA, PERSISTENT 10/18/2007  . FATIGUE 10/18/2007  . LEG PAIN 09/16/2007  . VITAMIN D DEFICIENCY 07/15/2007  . WEIGHT GAIN 07/15/2007  . UPPER RESPIRATORY INFECTION (URI) 04/26/2007  . ANXIETY 02/17/2007  . GERD 02/17/2007  . LOW BACK PAIN 02/17/2007  . B12 DEFICIENCY 11/08/2006  . PANIC ATTACK 11/08/2006  . DEPRESSION 11/08/2006  . HYPERTENSION 11/08/2006  . CROHN'S DISEASE 11/08/2006    Allergies  Allergen Reactions  . Amphetamine-Dextroamphet Er   . Cymbalta [Duloxetine Hcl]     ? sleepy  . Erythromycin Ethylsuccinate     REACTION: n/v  . Morphine     REACTION: nausea      Past Surgical History  Procedure Laterality Date  . Ileostomy    . Colectomy  2001    ulcerative colitis   Walker Kehr, MD No diagnosis found.  CT scan reviewed with her.  She has 2 large ventral herniae.  She also has a 3 mm pulmonary nodule and she has a remote history of smoking.  Will need followup CT and I discussed this with her and I gave her a copy of the CT.   In the meantime, I think that we should determine if she is a candidate for a sleeve gastrectomy.  I want Sherion to look in to this. Return in 3 weeks.  Matt B. Hassell Done, MD, [facility] [phone] beeper [phone]  10/12/2013 12:37 PM

## 2013-10-26 ENCOUNTER — Other Ambulatory Visit (INDEPENDENT_AMBULATORY_CARE_PROVIDER_SITE_OTHER): Payer: Self-pay

## 2013-10-26 DIAGNOSIS — D638 Anemia in other chronic diseases classified elsewhere: Secondary | ICD-10-CM

## 2013-10-26 DIAGNOSIS — R635 Abnormal weight gain: Secondary | ICD-10-CM

## 2013-10-26 DIAGNOSIS — E611 Iron deficiency: Secondary | ICD-10-CM

## 2013-10-26 DIAGNOSIS — Z1231 Encounter for screening mammogram for malignant neoplasm of breast: Secondary | ICD-10-CM

## 2013-10-26 DIAGNOSIS — N289 Disorder of kidney and ureter, unspecified: Secondary | ICD-10-CM

## 2013-10-26 DIAGNOSIS — E569 Vitamin deficiency, unspecified: Secondary | ICD-10-CM

## 2013-10-26 DIAGNOSIS — Z1239 Encounter for other screening for malignant neoplasm of breast: Secondary | ICD-10-CM

## 2013-11-04 ENCOUNTER — Other Ambulatory Visit: Payer: Self-pay | Admitting: Internal Medicine

## 2013-11-08 ENCOUNTER — Encounter: Payer: Self-pay | Admitting: Internal Medicine

## 2013-11-08 ENCOUNTER — Ambulatory Visit (INDEPENDENT_AMBULATORY_CARE_PROVIDER_SITE_OTHER): Payer: Medicare Other | Admitting: Internal Medicine

## 2013-11-08 VITALS — BP 120/90 | HR 96 | Temp 98.3°F | Ht 64.0 in | Wt 266.0 lb

## 2013-11-08 DIAGNOSIS — M545 Low back pain, unspecified: Secondary | ICD-10-CM | POA: Diagnosis not present

## 2013-11-08 DIAGNOSIS — N183 Chronic kidney disease, stage 3 unspecified: Secondary | ICD-10-CM | POA: Diagnosis not present

## 2013-11-08 DIAGNOSIS — F329 Major depressive disorder, single episode, unspecified: Secondary | ICD-10-CM | POA: Diagnosis not present

## 2013-11-08 DIAGNOSIS — E538 Deficiency of other specified B group vitamins: Secondary | ICD-10-CM

## 2013-11-08 DIAGNOSIS — F3289 Other specified depressive episodes: Secondary | ICD-10-CM

## 2013-11-08 DIAGNOSIS — E559 Vitamin D deficiency, unspecified: Secondary | ICD-10-CM

## 2013-11-08 DIAGNOSIS — R635 Abnormal weight gain: Secondary | ICD-10-CM | POA: Diagnosis not present

## 2013-11-08 MED ORDER — OXYCODONE HCL 15 MG PO TABS: 15.0000 mg | ORAL_TABLET | Freq: Four times a day (QID) | ORAL | Status: DC

## 2013-11-08 NOTE — Progress Notes (Signed)
Pre visit review using our clinic review tool, if applicable. No additional management support is needed unless otherwise documented below in the visit note. 

## 2013-11-08 NOTE — Assessment & Plan Note (Signed)
Continue with current prescription therapy as reflected on the Med list.  

## 2013-11-08 NOTE — Progress Notes (Signed)
Subjective:    HPI  The patient presents for a follow-up of FMS, abn labs, CRI, chronic hypertension, chronic LBP.   LBP is worse. Pain is worse. Pain is controlled for 2 hrs after pain meds F/u obesity she went to a bariatric seminar, saw Dr Hassell Done, had a CT: 2 hernias.   She may be a candidate for a sleeve procedure.  She is on gluten and milk free diet S/p Colectomy - 2001   BP Readings from Last 3 Encounters:  11/08/13 120/90  10/12/13 150/100  09/14/13 132/88   Wt Readings from Last 3 Encounters:  11/08/13 266 lb (120.657 kg)  10/12/13 264 lb 3.2 oz (119.84 kg)  09/14/13 262 lb 9.6 oz (119.115 kg)       Review of Systems  Constitutional: Positive for fatigue and unexpected weight change. Negative for chills, activity change and appetite change.  HENT: Positive for postnasal drip. Negative for congestion, dental problem, mouth sores, rhinorrhea, sinus pressure and voice change.   Eyes: Negative for visual disturbance.  Respiratory: Negative for cough, chest tightness and shortness of breath.   Cardiovascular: Negative for chest pain and palpitations.  Gastrointestinal: Positive for abdominal distention. Negative for nausea, vomiting, abdominal pain, diarrhea and blood in stool.  Genitourinary: Negative for dysuria, urgency, frequency, difficulty urinating and vaginal pain.  Musculoskeletal: Positive for arthralgias, back pain and myalgias. Negative for gait problem and joint swelling.  Skin: Negative for pallor and rash.  Neurological: Negative for dizziness, tremors, weakness, numbness and headaches.  Psychiatric/Behavioral: Positive for dysphoric mood. Negative for suicidal ideas, confusion and sleep disturbance. The patient is nervous/anxious.        Objective:   Physical Exam  Constitutional: She appears well-developed. No distress.  Obese  HENT:  Head: Normocephalic.  Right Ear: External ear normal.  Left Ear: External ear normal.  Nose: Nose normal.   Mouth/Throat: Oropharynx is clear and moist.  Eyes: Conjunctivae are normal. Pupils are equal, round, and reactive to light. Right eye exhibits no discharge. Left eye exhibits no discharge. No scleral icterus.  Neck: Normal range of motion. Neck supple. No JVD present. No tracheal deviation present. No thyromegaly present.  Cardiovascular: Normal rate, regular rhythm and normal heart sounds.   Pulmonary/Chest: No stridor. No respiratory distress. She has no wheezes.  Abdominal: Soft. Bowel sounds are normal. She exhibits no distension and no mass. There is no tenderness. There is no rebound and no guarding.  colostomy  Musculoskeletal: She exhibits tenderness. She exhibits no edema.  LS, B knees - tender  Lymphadenopathy:    She has no cervical adenopathy.  Neurological: She displays normal reflexes. No cranial nerve deficit. She exhibits normal muscle tone. Coordination normal.  Skin: No rash noted. No erythema.  Psychiatric: Her behavior is normal. Judgment and thought content normal.  sad  pouch  Lab Results  Component Value Date   WBC 5.4 08/18/2012   HGB 13.3 09/21/2013   HCT 39.0 09/21/2013   PLT 210 08/18/2012   GLUCOSE 92 09/21/2013   CHOL 172 05/27/2010   TRIG 100.0 05/27/2010   HDL 46.30 05/27/2010   LDLCALC 106* 05/27/2010   ALT 14 08/15/2012   AST 13 08/15/2012   NA 137 09/21/2013   K 4.5 09/21/2013   CL 102 09/21/2013   CREATININE 1.30* 09/21/2013   BUN 22 09/21/2013   CO2 28 08/11/2013   TSH 0.55 11/07/2012   HGBA1C 5.9 12/17/2009   MICROALBUR 0.6 02/10/2013    Labs, tests and records reviewed  Assessment & Plan:

## 2013-11-08 NOTE — Assessment & Plan Note (Signed)
Wt Readings from Last 3 Encounters:  11/08/13 266 lb (120.657 kg)  10/12/13 264 lb 3.2 oz (119.84 kg)  09/14/13 262 lb 9.6 oz (119.115 kg)

## 2013-11-08 NOTE — Assessment & Plan Note (Signed)
Watching labs

## 2013-11-13 ENCOUNTER — Encounter: Payer: Medicare Other | Attending: Surgery | Admitting: Dietician

## 2013-11-13 ENCOUNTER — Encounter: Payer: Self-pay | Admitting: Dietician

## 2013-11-13 DIAGNOSIS — Z01818 Encounter for other preprocedural examination: Secondary | ICD-10-CM | POA: Diagnosis not present

## 2013-11-13 DIAGNOSIS — Z6841 Body Mass Index (BMI) 40.0 and over, adult: Secondary | ICD-10-CM | POA: Diagnosis not present

## 2013-11-13 DIAGNOSIS — K5090 Crohn's disease, unspecified, without complications: Secondary | ICD-10-CM | POA: Diagnosis not present

## 2013-11-13 DIAGNOSIS — Z713 Dietary counseling and surveillance: Secondary | ICD-10-CM | POA: Insufficient documentation

## 2013-11-13 NOTE — Progress Notes (Signed)
  Pre-Op Assessment Visit:  Pre-Operative Sleeve Gastrectomy Surgery  Medical Nutrition Therapy:  Appt start time: 0459   End time:  1440.  Patient was seen on 11/13/2013 for Pre-Operative Sleeve Gastrecomty Nutrition Assessment. Assessment and letter of approval faxed to [facility] Bariatric Surgery Program coordinator on 11/13/2013.   Preferred Learning Style:   No preference indicated   Learning Readiness:   Ready  Handouts given during visit include:  Pre-Op Goals Bariatric Surgery Protein Shakes  Teaching Method Utilized:  Visual Auditory Hands on  Barriers to learning/adherence to lifestyle change: has nausea and GI pain d/t hx of Crohn's disease and ostomy  Demonstrated degree of understanding via:  Teach Back   Patient to call the [facility] to enroll in Pre-Op and Post-Op Nutrition Education when surgery date is scheduled. Attend 6 Months of Supervised Weight Loss.

## 2013-11-13 NOTE — Patient Instructions (Signed)
Follow Pre-Op Goals Try Protein Shakes Call [facility] at [phone] when surgery is scheduled to enroll in Pre-Op Class Try ginger pills instead of ginger ale.

## 2013-11-14 ENCOUNTER — Ambulatory Visit (INDEPENDENT_AMBULATORY_CARE_PROVIDER_SITE_OTHER): Payer: Medicare Other | Admitting: Surgery

## 2013-11-14 DIAGNOSIS — M25569 Pain in unspecified knee: Secondary | ICD-10-CM | POA: Diagnosis not present

## 2013-11-14 DIAGNOSIS — M1710 Unilateral primary osteoarthritis, unspecified knee: Secondary | ICD-10-CM | POA: Diagnosis not present

## 2013-11-15 ENCOUNTER — Ambulatory Visit (INDEPENDENT_AMBULATORY_CARE_PROVIDER_SITE_OTHER): Payer: Medicare Other | Admitting: Surgery

## 2013-11-15 DIAGNOSIS — H43819 Vitreous degeneration, unspecified eye: Secondary | ICD-10-CM | POA: Diagnosis not present

## 2013-11-16 ENCOUNTER — Encounter: Payer: Medicare Other | Admitting: Dietician

## 2013-11-16 NOTE — Progress Notes (Signed)
  6 Months Supervised Weight Loss Visit:   Pre-Operative Sleeve Surgery  Medical Nutrition Therapy:  Appt start time: 1200 end time:  1215.  Primary concerns today: Supervised Weight Loss Visit. Started working on chewing well, is not using straws and stopped buying diet iced tea.  Planning to drink 5 calorie cranberry juice.  Had a cortisone injection her knee the other day so she hasn't been able to add more activity.   Planning to try protein drinks and becoming more aware of sugar content of foods.   Weight: 265 lbs BMI: 45.6  Medications: see list  Recent physical activity:  Working in her yard  Progress Towards Goal(s):  In progress.   Nutritional Diagnosis:  [place]-3.3 Overweight/obesity related to past poor dietary habits and physical inactivity as evidenced by patient plans for Sleeve Gastrectomy surgery following dietary guidelines for continued weight loss.    Intervention:  Nutrition counseling provided.  Monitoring/Evaluation:  Dietary intake, exercise, and body weight. Follow up in 1 months for 6 month supervised weight loss visit.

## 2013-11-16 NOTE — Patient Instructions (Signed)
Plan to try protein shakes. Start limiting sugar (being more aware of it). Continue working on chewing foods well. Think about making your own diet green tea (caffeine free with splenda). Think about adding some more physical.

## 2013-11-23 ENCOUNTER — Ambulatory Visit: Payer: Medicare Other | Admitting: Internal Medicine

## 2013-11-28 ENCOUNTER — Other Ambulatory Visit: Payer: Self-pay | Admitting: Internal Medicine

## 2013-12-05 ENCOUNTER — Encounter (HOSPITAL_COMMUNITY)
Admission: RE | Disposition: A | Discharge status: Home / Self Care | Payer: Self-pay | Source: Ambulatory Visit | Attending: Surgery

## 2013-12-05 ENCOUNTER — Ambulatory Visit (HOSPITAL_COMMUNITY)
Admission: RE | Admit: 2013-12-05 | Discharge: 2013-12-05 | Disposition: A | Discharge status: Home / Self Care | Payer: Medicare Other | Source: Ambulatory Visit | Attending: Surgery | Admitting: Surgery

## 2013-12-05 ENCOUNTER — Ambulatory Visit: Payer: Medicare Other | Admitting: Dietician

## 2013-12-05 ENCOUNTER — Other Ambulatory Visit (INDEPENDENT_AMBULATORY_CARE_PROVIDER_SITE_OTHER): Payer: Self-pay

## 2013-12-05 DIAGNOSIS — D509 Iron deficiency anemia, unspecified: Secondary | ICD-10-CM | POA: Diagnosis not present

## 2013-12-05 DIAGNOSIS — G4700 Insomnia, unspecified: Secondary | ICD-10-CM | POA: Insufficient documentation

## 2013-12-05 DIAGNOSIS — E611 Iron deficiency: Secondary | ICD-10-CM

## 2013-12-05 DIAGNOSIS — M1710 Unilateral primary osteoarthritis, unspecified knee: Secondary | ICD-10-CM | POA: Insufficient documentation

## 2013-12-05 DIAGNOSIS — A048 Other specified bacterial intestinal infections: Secondary | ICD-10-CM | POA: Diagnosis not present

## 2013-12-05 DIAGNOSIS — Z1239 Encounter for other screening for malignant neoplasm of breast: Secondary | ICD-10-CM | POA: Diagnosis not present

## 2013-12-05 DIAGNOSIS — I129 Hypertensive chronic kidney disease with stage 1 through stage 4 chronic kidney disease, or unspecified chronic kidney disease: Secondary | ICD-10-CM | POA: Insufficient documentation

## 2013-12-05 DIAGNOSIS — D638 Anemia in other chronic diseases classified elsewhere: Secondary | ICD-10-CM | POA: Diagnosis not present

## 2013-12-05 DIAGNOSIS — F329 Major depressive disorder, single episode, unspecified: Secondary | ICD-10-CM | POA: Diagnosis not present

## 2013-12-05 DIAGNOSIS — F3289 Other specified depressive episodes: Secondary | ICD-10-CM | POA: Diagnosis not present

## 2013-12-05 DIAGNOSIS — F410 Panic disorder [episodic paroxysmal anxiety] without agoraphobia: Secondary | ICD-10-CM | POA: Insufficient documentation

## 2013-12-05 DIAGNOSIS — R635 Abnormal weight gain: Secondary | ICD-10-CM

## 2013-12-05 DIAGNOSIS — N289 Disorder of kidney and ureter, unspecified: Secondary | ICD-10-CM | POA: Diagnosis not present

## 2013-12-05 DIAGNOSIS — F411 Generalized anxiety disorder: Secondary | ICD-10-CM | POA: Diagnosis not present

## 2013-12-05 DIAGNOSIS — Z1231 Encounter for screening mammogram for malignant neoplasm of breast: Secondary | ICD-10-CM | POA: Diagnosis not present

## 2013-12-05 DIAGNOSIS — IMO0001 Reserved for inherently not codable concepts without codable children: Secondary | ICD-10-CM | POA: Insufficient documentation

## 2013-12-05 DIAGNOSIS — E569 Vitamin deficiency, unspecified: Secondary | ICD-10-CM | POA: Diagnosis not present

## 2013-12-05 DIAGNOSIS — N189 Chronic kidney disease, unspecified: Secondary | ICD-10-CM | POA: Diagnosis not present

## 2013-12-05 HISTORY — PX: BREATH TEK H PYLORI: SHX5422

## 2013-12-05 LAB — COMPREHENSIVE METABOLIC PANEL
ALT: 11 U/L (ref 0–35)
AST: 17 U/L (ref 0–37)
Albumin: 3.9 g/dL (ref 3.5–5.2)
Alkaline Phosphatase: 82 U/L (ref 39–117)
BILIRUBIN TOTAL: 0.4 mg/dL (ref 0.2–1.2)
BUN: 25 mg/dL — ABNORMAL HIGH (ref 6–23)
CO2: 22 mEq/L (ref 19–32)
Calcium: 9.2 mg/dL (ref 8.4–10.5)
Chloride: 100 mEq/L (ref 96–112)
Creat: 1.18 mg/dL — ABNORMAL HIGH (ref 0.50–1.10)
GLUCOSE: 69 mg/dL — AB (ref 70–99)
Potassium: 3.9 mEq/L (ref 3.5–5.3)
SODIUM: 135 meq/L (ref 135–145)
TOTAL PROTEIN: 7.3 g/dL (ref 6.0–8.3)

## 2013-12-05 LAB — TSH: TSH: 0.825 u[IU]/mL (ref 0.350–4.500)

## 2013-12-05 SURGERY — BREATH TEST, FOR HELICOBACTER PYLORI

## 2013-12-05 NOTE — Progress Notes (Signed)
12/05/13 0802  BREATH TEK ASSESSMENT  Referring MD Johnathan Hausen  Time of Last PO Intake 2130  Baseline Breath At: 0712  Pranactin Given At: 0713  Post-Dose Breath At: 0728  Sample 1 3.3  Sample 2 2.9  Test Postive

## 2013-12-06 ENCOUNTER — Encounter (HOSPITAL_COMMUNITY): Payer: Self-pay | Admitting: Surgery

## 2013-12-06 ENCOUNTER — Ambulatory Visit (HOSPITAL_COMMUNITY)
Admission: RE | Admit: 2013-12-06 | Discharge: 2013-12-06 | Disposition: A | Discharge status: Home / Self Care | Payer: Medicare Other | Source: Ambulatory Visit | Attending: Surgery | Admitting: Surgery

## 2013-12-06 DIAGNOSIS — Z1231 Encounter for screening mammogram for malignant neoplasm of breast: Secondary | ICD-10-CM | POA: Diagnosis not present

## 2013-12-06 DIAGNOSIS — K224 Dyskinesia of esophagus: Secondary | ICD-10-CM | POA: Insufficient documentation

## 2013-12-06 DIAGNOSIS — Z01818 Encounter for other preprocedural examination: Secondary | ICD-10-CM | POA: Diagnosis not present

## 2013-12-06 DIAGNOSIS — R635 Abnormal weight gain: Secondary | ICD-10-CM

## 2013-12-06 DIAGNOSIS — E569 Vitamin deficiency, unspecified: Secondary | ICD-10-CM

## 2013-12-06 DIAGNOSIS — K439 Ventral hernia without obstruction or gangrene: Secondary | ICD-10-CM | POA: Diagnosis not present

## 2013-12-06 DIAGNOSIS — N289 Disorder of kidney and ureter, unspecified: Secondary | ICD-10-CM

## 2013-12-06 DIAGNOSIS — E611 Iron deficiency: Secondary | ICD-10-CM

## 2013-12-06 DIAGNOSIS — K436 Other and unspecified ventral hernia with obstruction, without gangrene: Secondary | ICD-10-CM | POA: Diagnosis not present

## 2013-12-06 DIAGNOSIS — Z1239 Encounter for other screening for malignant neoplasm of breast: Secondary | ICD-10-CM

## 2013-12-06 DIAGNOSIS — D638 Anemia in other chronic diseases classified elsewhere: Secondary | ICD-10-CM

## 2013-12-06 LAB — T4: T4 TOTAL: 10.2 ug/dL (ref 5.0–12.5)

## 2013-12-06 LAB — CBC

## 2013-12-06 LAB — PREGNANCY, URINE: Preg Test, Ur: NEGATIVE

## 2013-12-12 ENCOUNTER — Encounter: Payer: Medicare Other | Attending: Surgery | Admitting: Dietician

## 2013-12-12 DIAGNOSIS — K5090 Crohn's disease, unspecified, without complications: Secondary | ICD-10-CM | POA: Diagnosis not present

## 2013-12-12 DIAGNOSIS — Z713 Dietary counseling and surveillance: Secondary | ICD-10-CM | POA: Diagnosis not present

## 2013-12-12 NOTE — Progress Notes (Signed)
  6 Months Supervised Weight Loss Visit:   Pre-Operative Sleeve Surgery  Medical Nutrition Therapy:  Appt start time: 619 end time:  012.  Primary concerns today: #2 Supervised Weight Loss Visit. Returns with a 10 lbs weight gain. Has been in a lot of pain recently. Had a cortisone shot in her knee recently. Her Crohn's disease flared up since her last visit. Not throwing up as bad as she was but still feeling nauseas. Eating whatever she could tolerate.   Tried Premier Protein and trying to have yogurt. Started having sweet tea again.     Wt Readings from Last 3 Encounters:  12/12/13 274 lb 9.6 oz (124.558 kg)  11/16/13 265 lb (120.203 kg)  11/13/13 265 lb 9.6 oz (120.475 kg)   Ht Readings from Last 3 Encounters:  12/12/13 5' 4"  (1.626 m)  11/16/13 5' 4"  (1.626 m)  11/13/13 5' 4"  (1.626 m)   Body mass index is 47.11 kg/(m^2). @BMIFA @ Normalized weight-for-age data available only for age 24 to 29 years. Normalized stature-for-age data available only for age 24 to 30 years.  Medications: see list  Recent physical activity:  Working in her yard  Progress Towards Goal(s):  In progress.   Nutritional Diagnosis:  -3.3 Overweight/obesity related to past poor dietary habits and physical inactivity as evidenced by patient plans for Sleeve Gastrectomy surgery following dietary guidelines for continued weight loss.    Intervention:  Nutrition counseling provided.  Monitoring/Evaluation:  Dietary intake, exercise, and body weight. Follow up in 1 months for 6 month supervised weight loss visit.

## 2013-12-12 NOTE — Patient Instructions (Addendum)
Continue working on chewing foods well. Think about making your own diet green tea (caffeine free with splenda). Limit sugar in beverages.  Try 5 calorie cranberry juice, herbal tea, or water. Try ginger capsules or ginger root for nausea. Think about adding some more physical activity when your pain is better.

## 2013-12-13 ENCOUNTER — Ambulatory Visit: Payer: Medicare Other | Admitting: Dietician

## 2014-01-16 ENCOUNTER — Encounter: Payer: Medicare Other | Attending: Surgery | Admitting: Dietician

## 2014-01-16 DIAGNOSIS — K5090 Crohn's disease, unspecified, without complications: Secondary | ICD-10-CM | POA: Insufficient documentation

## 2014-01-16 DIAGNOSIS — Z713 Dietary counseling and surveillance: Secondary | ICD-10-CM | POA: Diagnosis not present

## 2014-01-16 NOTE — Progress Notes (Signed)
  6 Months Supervised Weight Loss Visit:   Pre-Operative Sleeve Surgery  Medical Nutrition Therapy:  Appt start time: 200 end time:  215.  Primary concerns today: #3 Supervised Weight Loss Visit. Returns with a 11 lbs weight loss since last time. Pain is better but still present. Has been drinking Community education officer (sometimes 3-4 per day) which she is tolerating well. Has not been eating as many crackers as before and drinking less green tea. Also is working on chewing foods well.  Has not tried to do much physical activity.   Wt Readings from Last 3 Encounters:  01/16/14 263 lb 11.2 oz (119.614 kg)  12/12/13 274 lb 9.6 oz (124.558 kg)  11/16/13 265 lb (120.203 kg)   Ht Readings from Last 3 Encounters:  01/16/14 5' 4"  (1.626 m)  12/12/13 5' 4"  (1.626 m)  11/16/13 5' 4"  (1.626 m)   Body mass index is 45.24 kg/(m^2). @BMIFA @ Normalized weight-for-age data available only for age 65 to 14 years. Normalized stature-for-age data available only for age 65 to 22 years.   Medications: see list  Recent physical activity:  Working in her yard  Progress Towards Goal(s):  In progress.   Nutritional Diagnosis:  [place]-3.3 Overweight/obesity related to past poor dietary habits and physical inactivity as evidenced by patient plans for Sleeve Gastrectomy surgery following dietary guidelines for continued weight loss.    Intervention:  Nutrition counseling provided.  Plan: Continue working on chewing foods well. Continue limiting sugar in beverages.  Look into water fitness classes.  Have salad dressing on the side or try [employer] dressings.   Handouts given out during appointment: Protein shakes [facility] water fitness class descriptions [facility] water fitness schedule  Monitoring/Evaluation:  Dietary intake, exercise, and body weight. Follow up in 1 months for 6 month supervised weight loss visit.

## 2014-01-16 NOTE — Patient Instructions (Addendum)
Continue working on chewing foods well. Continue limiting sugar in beverages.  Look into water fitness classes.  Have salad dressing on the side or try [employer] dressings.

## 2014-02-14 ENCOUNTER — Encounter: Payer: Medicare Other | Attending: Surgery | Admitting: Dietician

## 2014-02-14 ENCOUNTER — Encounter: Payer: Self-pay | Admitting: Internal Medicine

## 2014-02-14 ENCOUNTER — Ambulatory Visit (INDEPENDENT_AMBULATORY_CARE_PROVIDER_SITE_OTHER): Payer: Medicare Other | Admitting: Internal Medicine

## 2014-02-14 VITALS — BP 130/90 | HR 99 | Temp 98.4°F | Wt 263.0 lb

## 2014-02-14 DIAGNOSIS — M5440 Lumbago with sciatica, unspecified side: Secondary | ICD-10-CM

## 2014-02-14 DIAGNOSIS — M159 Polyosteoarthritis, unspecified: Secondary | ICD-10-CM

## 2014-02-14 DIAGNOSIS — K50918 Crohn's disease, unspecified, with other complication: Secondary | ICD-10-CM

## 2014-02-14 DIAGNOSIS — Z23 Encounter for immunization: Secondary | ICD-10-CM

## 2014-02-14 DIAGNOSIS — M150 Primary generalized (osteo)arthritis: Secondary | ICD-10-CM

## 2014-02-14 DIAGNOSIS — F411 Generalized anxiety disorder: Secondary | ICD-10-CM

## 2014-02-14 MED ORDER — CYANOCOBALAMIN 1000 MCG/ML IJ SOLN: 1000.0000 ug | INTRAMUSCULAR | Status: DC

## 2014-02-14 MED ORDER — "SYRINGE/NEEDLE (DISP) 23G X 1"" 3 ML MISC": 1.0000 | Status: DC

## 2014-02-14 MED ORDER — OXYCODONE HCL 15 MG PO TABS: 15.0000 mg | ORAL_TABLET | Freq: Four times a day (QID) | ORAL | Status: DC

## 2014-02-14 MED ORDER — PAROXETINE HCL 10 MG PO TABS: 10.0000 mg | ORAL_TABLET | Freq: Every day | ORAL | Status: DC

## 2014-02-14 MED ORDER — PHENTERMINE HCL 37.5 MG PO TABS: ORAL_TABLET | ORAL | Status: DC

## 2014-02-14 NOTE — Assessment & Plan Note (Signed)
Continue with current prescription therapy as reflected on the Med list.  

## 2014-02-14 NOTE — Assessment & Plan Note (Signed)
  Right>Left Knee

## 2014-02-14 NOTE — Patient Instructions (Addendum)
Try stevia if you want to sweeten foods. Try some chair exercises to help pain in knees and back.  Continue working on chewing foods well. Work on trying to drink 64 oz of fluid per day. Sip throughout the day. Continue not drinking 15 minutes before and up through 30 minutes after you eat.

## 2014-02-14 NOTE — Progress Notes (Signed)
  6 Months Supervised Weight Loss Visit:   Pre-Operative Sleeve Surgery  Medical Nutrition Therapy:  Appt start time: 1100 end time:  7207.  Primary concerns today: #4 Supervised Weight Loss Visit. Returns with a 1 lbs weight loss since last time. Fibromyalgia has been acting up d/t the rain this week. Having an egg or yogurt in the morning to eat. Having one Premier Protein shake each day. Has been drinking more water. No longer having any sugary beverages.   Has tried to do some exercising by it started bothering her knee and back. Has tried using ice after but it is not helping.   Wt Readings from Last 3 Encounters:  02/14/14 262 lb 6.4 oz (119.024 kg)  02/14/14 263 lb (119.296 kg)  01/16/14 263 lb 11.2 oz (119.614 kg)   Ht Readings from Last 3 Encounters:  02/14/14 5' 4"  (1.626 m)  01/16/14 5' 4"  (1.626 m)  12/12/13 5' 4"  (1.626 m)   Body mass index is 45.02 kg/(m^2). @BMIFA @ Normalized weight-for-age data available only for age 17 to 46 years. Normalized stature-for-age data available only for age 17 to 26 years.   Medications: see list  Recent physical activity:  Working in her yard  Progress Towards Goal(s):  In progress.   Nutritional Diagnosis:  [place]-3.3 Overweight/obesity related to past poor dietary habits and physical inactivity as evidenced by patient plans for Sleeve Gastrectomy surgery following dietary guidelines for continued weight loss.    Intervention:  Nutrition counseling provided.  Plan: Try stevia if you want to sweeten foods. Try some chair exercises to help pain in knees and back.  Continue working on chewing foods well. Work on trying to drink 64 oz of fluid per day. Sip throughout the day. Continue not drinking 15 minutes before and up through 30 minutes after you eat.   Handouts given out during appointment: Chair Exercises  Monitoring/Evaluation:  Dietary intake, exercise, and body weight. Follow up in 1 months for 6 month supervised weight loss  visit.

## 2014-02-14 NOTE — Progress Notes (Signed)
Pre visit review using our clinic review tool, if applicable. No additional management support is needed unless otherwise documented below in the visit note. 

## 2014-02-14 NOTE — Assessment & Plan Note (Signed)
Chronic   Potential benefits of a long term opioids use as well as potential risks (i.e. addiction risk, apnea etc) and complications (i.e. Somnolence, constipation and others) were explained to the patient and were aknowledged.

## 2014-02-14 NOTE — Assessment & Plan Note (Signed)
Chronic  Potential benefits of a long term benzodiazepines  use as well as potential risks  and complications were explained to the patient and were aknowledged.

## 2014-02-14 NOTE — Progress Notes (Signed)
Subjective:    HPI  The patient presents for a follow-up of FMS, abn labs, CRI, chronic hypertension, chronic LBP.   LBP is worse. Pain is worse. Pain is controlled for 2 hrs after pain meds F/u obesity she went to a bariatric seminar, saw Dr Hassell Done, had a CT: 2 hernias.   She may be a candidate for a sleeve procedure.  She is on gluten and milk free diet S/p Colectomy - 2001   BP Readings from Last 3 Encounters:  02/14/14 130/90  11/08/13 120/90  10/12/13 150/100   Wt Readings from Last 3 Encounters:  02/14/14 263 lb (119.296 kg)  01/16/14 263 lb 11.2 oz (119.614 kg)  12/12/13 274 lb 9.6 oz (124.558 kg)       Review of Systems  Constitutional: Positive for fatigue and unexpected weight change. Negative for chills, activity change and appetite change.  HENT: Positive for postnasal drip. Negative for congestion, dental problem, mouth sores, rhinorrhea, sinus pressure and voice change.   Eyes: Negative for visual disturbance.  Respiratory: Negative for cough, chest tightness and shortness of breath.   Cardiovascular: Negative for chest pain and palpitations.  Gastrointestinal: Positive for abdominal distention. Negative for nausea, vomiting, abdominal pain, diarrhea and blood in stool.  Genitourinary: Negative for dysuria, urgency, frequency, difficulty urinating and vaginal pain.  Musculoskeletal: Positive for arthralgias, back pain and myalgias. Negative for gait problem and joint swelling.  Skin: Negative for pallor and rash.  Neurological: Negative for dizziness, tremors, weakness, numbness and headaches.  Psychiatric/Behavioral: Positive for dysphoric mood. Negative for suicidal ideas, confusion and sleep disturbance. The patient is nervous/anxious.        Objective:   Physical Exam  Constitutional: She appears well-developed. No distress.  Obese  HENT:  Head: Normocephalic.  Right Ear: External ear normal.  Left Ear: External ear normal.  Nose: Nose  normal.  Mouth/Throat: Oropharynx is clear and moist.  Eyes: Conjunctivae are normal. Pupils are equal, round, and reactive to light. Right eye exhibits no discharge. Left eye exhibits no discharge. No scleral icterus.  Neck: Normal range of motion. Neck supple. No JVD present. No tracheal deviation present. No thyromegaly present.  Cardiovascular: Normal rate, regular rhythm and normal heart sounds.   Pulmonary/Chest: No stridor. No respiratory distress. She has no wheezes.  Abdominal: Soft. Bowel sounds are normal. She exhibits no distension and no mass. There is no tenderness. There is no rebound and no guarding.  colostomy  Musculoskeletal: She exhibits tenderness. She exhibits no edema.  LS, B knees - tender  Lymphadenopathy:    She has no cervical adenopathy.  Neurological: She displays normal reflexes. No cranial nerve deficit. She exhibits normal muscle tone. Coordination normal.  Skin: No rash noted. No erythema.  Psychiatric: Her behavior is normal. Judgment and thought content normal.  sad  pouch  Lab Results  Component Value Date   WBC CANCELED 12/05/2013   HGB CANCELED 12/05/2013   HCT CANCELED 12/05/2013   PLT CANCELED 12/05/2013   GLUCOSE 69* 12/05/2013   CHOL 172 05/27/2010   TRIG 100.0 05/27/2010   HDL 46.30 05/27/2010   LDLCALC 106* 05/27/2010   ALT 11 12/05/2013   AST 17 12/05/2013   NA 135 12/05/2013   K 3.9 12/05/2013   CL 100 12/05/2013   CREATININE 1.18* 12/05/2013   BUN 25* 12/05/2013   CO2 22 12/05/2013   TSH 0.825 12/05/2013   HGBA1C 5.9 12/17/2009   MICROALBUR 0.6 02/10/2013    Labs, tests and records reviewed  Assessment & Plan:

## 2014-03-06 ENCOUNTER — Other Ambulatory Visit: Payer: Self-pay | Admitting: Internal Medicine

## 2014-03-08 DIAGNOSIS — M1711 Unilateral primary osteoarthritis, right knee: Secondary | ICD-10-CM | POA: Diagnosis not present

## 2014-03-13 ENCOUNTER — Encounter: Payer: Medicare Other | Attending: Surgery | Admitting: Dietician

## 2014-03-13 DIAGNOSIS — Z713 Dietary counseling and surveillance: Secondary | ICD-10-CM | POA: Insufficient documentation

## 2014-03-13 DIAGNOSIS — Z6841 Body Mass Index (BMI) 40.0 and over, adult: Secondary | ICD-10-CM | POA: Diagnosis not present

## 2014-03-13 NOTE — Progress Notes (Signed)
  6 Months Supervised Weight Loss Visit:   Pre-Operative Sleeve Surgery  Medical Nutrition Therapy:  Appt start time: 1100 end time:  2542.  Primary concerns today: #5 Supervised Weight Loss Visit. Returns with a 3 lbs weight loss since last time. Started walking on the treadmill and elliptical and then her knee and back started hurting her. Had to get an injection. Her doctor recommended walking on the street instead. Still having pain. Doing arm exercises too.   No new changes to her diet.   Wt Readings from Last 3 Encounters:  03/13/14 259 lb 4.8 oz (117.618 kg)  02/14/14 262 lb 6.4 oz (119.024 kg)  02/14/14 263 lb (119.296 kg)   Ht Readings from Last 3 Encounters:  03/13/14 5' 4"  (1.626 m)  02/14/14 5' 4"  (1.626 m)  01/16/14 5' 4"  (1.626 m)   Body mass index is 44.49 kg/(m^2). @BMIFA @ Normalized weight-for-age data available only for age 66 to 23 years. Normalized stature-for-age data available only for age 66 to 75 years.   Medications: see list  Recent physical activity:  Walking   Progress Towards Goal(s):  In progress.   Nutritional Diagnosis:  [place]-3.3 Overweight/obesity related to past poor dietary habits and physical inactivity as evidenced by patient plans for Sleeve Gastrectomy surgery following dietary guidelines for continued weight loss.    Intervention:  Nutrition counseling provided.  Plan: Continue doing some chair exercises to help pain in knees and back.  Plan to walk outside when pain is better.  Work on trying to drink 64 oz of fluid per day. Sip throughout the day. Continue not drinking 15 minutes before and up through 30 minutes after you eat.  After surgery, you will need to take 2 doses of complete multivitamin each day, 1500 mg of calcium citrate, and Vitamin B12.  Monitoring/Evaluation:  Dietary intake, exercise, and body weight. Follow up in 1 months for 6 month supervised weight loss visit.

## 2014-03-13 NOTE — Patient Instructions (Addendum)
Continue doing some chair exercises to help pain in knees and back.  Plan to walk outside when pain is better.  Work on trying to drink 64 oz of fluid per day. Sip throughout the day. Continue not drinking 15 minutes before and up through 30 minutes after you eat.  After surgery, you will need to take 2 doses of complete multivitamin each day, 1500 mg of calcium citrate, and Vitamin B12.

## 2014-04-10 ENCOUNTER — Encounter: Payer: Medicare Other | Attending: Surgery | Admitting: Dietician

## 2014-04-10 DIAGNOSIS — Z713 Dietary counseling and surveillance: Secondary | ICD-10-CM | POA: Insufficient documentation

## 2014-04-10 DIAGNOSIS — Z6841 Body Mass Index (BMI) 40.0 and over, adult: Secondary | ICD-10-CM | POA: Diagnosis not present

## 2014-04-10 NOTE — Patient Instructions (Addendum)
Continue doing some chair exercises to help pain in knees and back.  Plan to walk outside when pain is better.  Work on trying to drink 64 oz of fluid per day. Sip throughout the day. Continue not drinking 15 minutes before and up through 30 minutes after you eat.  Call [facility] at [phone] to enroll in Pre-op class once surgery is scheduled.  After surgery, you will need to take 2 doses of complete multivitamin each day, 1500 mg of calcium citrate, and Vitamin B12.

## 2014-04-10 NOTE — Progress Notes (Signed)
  6 Months Supervised Weight Loss Visit:   Pre-Operative Sleeve Surgery  Medical Nutrition Therapy:  Appt start time: 200 end time:  215.  Primary concerns today: #6 Supervised Weight Loss Visit. Returns with a 2 lbs weight loss since last time. Feels like she is not getting in enough water since drinking aggravates her hernia.   Was doing some walking but had an episode of vertigo which caused her to have trouble walking. Parking further away in shopping centers.   Medications: see list  Recent physical activity:  Walking and doing some arm exercise   Progress Towards Goal(s):  In progress.   Nutritional Diagnosis:  [place]-3.3 Overweight/obesity related to past poor dietary habits and physical inactivity as evidenced by patient plans for Sleeve Gastrectomy surgery following dietary guidelines for continued weight loss.    Intervention:  Nutrition counseling provided.  Plan: Continue doing some chair exercises to help pain in knees and back.  Plan to walk outside when pain is better.  Work on trying to drink 64 oz of fluid per day. Sip throughout the day. Continue not drinking 15 minutes before and up through 30 minutes after you eat.  Call [facility] at [phone] to enroll in Pre-op class once surgery is scheduled.  Monitoring/Evaluation:  Dietary intake, exercise, and body weight. Follow up to attend Pre-Op class

## 2014-04-12 ENCOUNTER — Emergency Department (HOSPITAL_COMMUNITY)
Admission: EM | Admit: 2014-04-12 | Discharge: 2014-04-13 | Disposition: A | Discharge status: Home / Self Care | Payer: Medicare Other | Attending: Emergency Medicine | Admitting: Emergency Medicine

## 2014-04-12 ENCOUNTER — Encounter (HOSPITAL_COMMUNITY): Payer: Self-pay | Admitting: Emergency Medicine

## 2014-04-12 DIAGNOSIS — N183 Chronic kidney disease, stage 3 (moderate): Secondary | ICD-10-CM | POA: Insufficient documentation

## 2014-04-12 DIAGNOSIS — Z8739 Personal history of other diseases of the musculoskeletal system and connective tissue: Secondary | ICD-10-CM | POA: Insufficient documentation

## 2014-04-12 DIAGNOSIS — Y9289 Other specified places as the place of occurrence of the external cause: Secondary | ICD-10-CM | POA: Diagnosis not present

## 2014-04-12 DIAGNOSIS — S61432A Puncture wound without foreign body of left hand, initial encounter: Secondary | ICD-10-CM | POA: Diagnosis not present

## 2014-04-12 DIAGNOSIS — Z8619 Personal history of other infectious and parasitic diseases: Secondary | ICD-10-CM | POA: Diagnosis not present

## 2014-04-12 DIAGNOSIS — Y9389 Activity, other specified: Secondary | ICD-10-CM | POA: Diagnosis not present

## 2014-04-12 DIAGNOSIS — Z8719 Personal history of other diseases of the digestive system: Secondary | ICD-10-CM | POA: Diagnosis not present

## 2014-04-12 DIAGNOSIS — Z8742 Personal history of other diseases of the female genital tract: Secondary | ICD-10-CM | POA: Diagnosis not present

## 2014-04-12 DIAGNOSIS — W540XXA Bitten by dog, initial encounter: Secondary | ICD-10-CM | POA: Insufficient documentation

## 2014-04-12 DIAGNOSIS — F419 Anxiety disorder, unspecified: Secondary | ICD-10-CM | POA: Diagnosis not present

## 2014-04-12 DIAGNOSIS — I129 Hypertensive chronic kidney disease with stage 1 through stage 4 chronic kidney disease, or unspecified chronic kidney disease: Secondary | ICD-10-CM | POA: Insufficient documentation

## 2014-04-12 DIAGNOSIS — Z4800 Encounter for change or removal of nonsurgical wound dressing: Secondary | ICD-10-CM | POA: Diagnosis not present

## 2014-04-12 DIAGNOSIS — L03012 Cellulitis of left finger: Secondary | ICD-10-CM | POA: Insufficient documentation

## 2014-04-12 DIAGNOSIS — F329 Major depressive disorder, single episode, unspecified: Secondary | ICD-10-CM | POA: Insufficient documentation

## 2014-04-12 DIAGNOSIS — Y998 Other external cause status: Secondary | ICD-10-CM | POA: Diagnosis not present

## 2014-04-12 DIAGNOSIS — S61231A Puncture wound without foreign body of left index finger without damage to nail, initial encounter: Secondary | ICD-10-CM | POA: Diagnosis not present

## 2014-04-12 DIAGNOSIS — T148XXA Other injury of unspecified body region, initial encounter: Secondary | ICD-10-CM

## 2014-04-12 LAB — CBC WITH DIFFERENTIAL/PLATELET
Basophils Absolute: 0 10*3/uL (ref 0.0–0.1)
Basophils Relative: 0 % (ref 0–1)
EOS ABS: 0 10*3/uL (ref 0.0–0.7)
Eosinophils Relative: 0 % (ref 0–5)
HCT: 42.7 % (ref 36.0–46.0)
HEMOGLOBIN: 13.5 g/dL (ref 12.0–15.0)
Lymphocytes Relative: 18 % (ref 12–46)
Lymphs Abs: 2.4 10*3/uL (ref 0.7–4.0)
MCH: 27.6 pg (ref 26.0–34.0)
MCHC: 31.6 g/dL (ref 30.0–36.0)
MCV: 87.1 fL (ref 78.0–100.0)
MONOS PCT: 8 % (ref 3–12)
Monocytes Absolute: 1.1 10*3/uL — ABNORMAL HIGH (ref 0.1–1.0)
NEUTROS ABS: 9.8 10*3/uL — AB (ref 1.7–7.7)
NEUTROS PCT: 74 % (ref 43–77)
Platelets: 296 10*3/uL (ref 150–400)
RBC: 4.9 MIL/uL (ref 3.87–5.11)
RDW: 15.6 % — ABNORMAL HIGH (ref 11.5–15.5)
WBC: 13.3 10*3/uL — ABNORMAL HIGH (ref 4.0–10.5)

## 2014-04-12 LAB — I-STAT CHEM 8, ED
BUN: 42 mg/dL — ABNORMAL HIGH (ref 6–23)
CHLORIDE: 98 meq/L (ref 96–112)
CREATININE: 1.5 mg/dL — AB (ref 0.50–1.10)
Calcium, Ion: 1.15 mmol/L (ref 1.12–1.23)
Glucose, Bld: 94 mg/dL (ref 70–99)
HCT: 47 % — ABNORMAL HIGH (ref 36.0–46.0)
Hemoglobin: 16 g/dL — ABNORMAL HIGH (ref 12.0–15.0)
POTASSIUM: 3.5 mmol/L (ref 3.5–5.1)
Sodium: 137 mmol/L (ref 135–145)
TCO2: 26 mmol/L (ref 0–100)

## 2014-04-12 MED ORDER — AMOXICILLIN-POT CLAVULANATE 875-125 MG PO TABS: 1.0000 | ORAL_TABLET | Freq: Two times a day (BID) | ORAL | Status: DC

## 2014-04-12 MED ORDER — SODIUM CHLORIDE 0.9 % IV SOLN
3.0000 g | Freq: Once | INTRAVENOUS | Status: AC
  Administered 2014-04-12: 3 g via INTRAVENOUS
  Filled 2014-04-12: qty 3

## 2014-04-12 MED ORDER — ONDANSETRON HCL 4 MG/2ML IJ SOLN
4.0000 mg | Freq: Once | INTRAMUSCULAR | Status: AC
  Administered 2014-04-12: 4 mg via INTRAVENOUS
  Filled 2014-04-12: qty 2

## 2014-04-12 MED ORDER — OXYCODONE-ACETAMINOPHEN 5-325 MG PO TABS: 1.0000 | ORAL_TABLET | Freq: Four times a day (QID) | ORAL | Status: DC | PRN

## 2014-04-12 MED ORDER — MORPHINE SULFATE 4 MG/ML IJ SOLN
4.0000 mg | Freq: Once | INTRAMUSCULAR | Status: AC
  Administered 2014-04-12: 4 mg via INTRAVENOUS
  Filled 2014-04-12: qty 1

## 2014-04-12 MED ORDER — SODIUM CHLORIDE 0.9 % IV SOLN
Freq: Once | INTRAVENOUS | Status: AC
  Administered 2014-04-12: 22:00:00 via INTRAVENOUS

## 2014-04-12 NOTE — ED Provider Notes (Signed)
CSN: [id number]     Arrival date & time 04/12/14  2056 History   First MD Initiated Contact with Patient 04/12/14 2117     Chief Complaint  Patient presents with  . Animal Bite     (Consider location/radiation/quality/duration/timing/severity/associated sxs/prior Treatment) HPI Comments: Bit by her dog at 3AM has several puncture wounds to dorsum of L hand and palmar surface of L index finger Now with significant swelling of area as well a red streaking along extensor tendon to 10 cm above wrist joint   The history is provided by the patient.    Past Medical History  Diagnosis Date  . Depression   . HTN (hypertension)   . Perianal pain     Chronic, Post-Op  . 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 Ulcerative colitis     Dr Sharlett Iles  . Anxiety   . Low back pain     FMS  . GERD (gastroesophageal reflux disease)   . Osteoarthritis     Dr Alvan Dame; both knees  . Enteritis due to Clostridium difficile 08/16/2012  . Crohn's disease   . Chronic kidney disease     stage 3    Past Surgical History  Procedure Laterality Date  . Ileostomy    . Colectomy  2001    ulcerative colitis  . Breath tek h pylori N/A 12/05/2013    Procedure: BREATH TEK H PYLORI;  Surgeon: Pedro Earls, MD;  Location: [facility];  Service: General;  Laterality: N/A;   Family History  Problem Relation Age of Onset  . Hypertension    . Heart disease Father 5    CHF  . Depression Sister    History  Substance Use Topics  . Smoking status: Former Research scientist (life sciences)  . Smokeless tobacco: Never Used  . Alcohol Use: No   OB History    No data available     Review of Systems  Constitutional: Negative for fever.  Musculoskeletal: Positive for joint swelling.  Skin: Positive for color change and wound.      Allergies  Amphetamine-dextroamphet er; Cymbalta; Erythromycin ethylsuccinate; and Morphine  Home Medications   Prior to Admission medications   Medication Sig Start Date End Date Taking?  Authorizing Provider  bifidobacterium infantis (ALIGN) capsule Take 1 capsule by mouth daily.     Yes Historical Provider, MD  Cholecalciferol 1000 UNITS tablet Take 3,000 Units by mouth daily.     Yes Historical Provider, MD  cyanocobalamin (,VITAMIN B-12,) 1000 MCG/ML injection Inject 1 mL (1,000 mcg total) into the muscle every 30 (thirty) days. 02/14/14  Yes Aleksei Plotnikov V, MD  cyclobenzaprine (FLEXERIL) 5 MG tablet TAKE 1-2 TABLETS BY MOUTH AT BEDTIME AND MAY REPEAT DOSE ONE TIME IF NEEDED 03/06/14  Yes Aleksei Plotnikov V, MD  hydrochlorothiazide (MICROZIDE) 12.5 MG capsule TAKE ONE CAPSULE BY MOUTH EVERY DAY   Yes Aleksei Plotnikov V, MD  Multiple Vitamin (MULTIVITAMIN WITH MINERALS) TABS tablet Take 1 tablet by mouth daily.   Yes Historical Provider, MD  oxyCODONE (ROXICODONE) 15 MG immediate release tablet Take 1 tablet (15 mg total) by mouth 4 (four) times daily. Please fill on or after  04/17/14 02/14/14  Yes Aleksei Plotnikov V, MD  phentermine (ADIPEX-P) 37.5 MG tablet TAKE 1 TABLET BY MOUTH DAILY BEFORE BREAKFAST 02/14/14  Yes Aleksei Plotnikov V, MD  SYRINGE-NEEDLE, DISP, 3 ML (B-D INTEGRA SYRINGE) 23G X 1" 3 ML MISC 1 Device by Does not apply route every 30 (thirty) days. For B12  shots 02/14/14  Yes Aleksei Plotnikov V, MD  amoxicillin-clavulanate (AUGMENTIN) 875-125 MG per tablet Take 1 tablet by mouth 2 (two) times daily. Patient taking differently: Take 1 tablet by mouth 2 (two) times daily. Started 12/25 for 10 days 04/12/14   Garald Balding, NP  calcium carbonate (OS-CAL) 600 MG TABS tablet Take 600 mg by mouth daily with breakfast.    Historical Provider, MD  oxyCODONE-acetaminophen (PERCOCET/ROXICET) 5-325 MG per tablet Take 1 tablet by mouth every 6 (six) hours as needed for severe pain. 04/12/14   Garald Balding, NP  PARoxetine (PAXIL) 10 MG tablet Take 1 tablet (10 mg total) by mouth at bedtime. Patient not taking: Reported on 04/12/2014 02/14/14   Aleksei Plotnikov V, MD     BP 132/74 mmHg  Pulse 111  Temp(Src) 99 F (37.2 C) (Oral)  Resp 16  Wt 257 lb (116.574 kg)  SpO2 97% Physical Exam  Constitutional: She is oriented to person, place, and time. She appears well-developed and well-nourished.  HENT:  Head: Normocephalic.  Eyes: Pupils are equal, round, and reactive to light.  Neck: Normal range of motion.  Cardiovascular: Normal rate.   Pulmonary/Chest: Effort normal.  Musculoskeletal: She exhibits edema and tenderness.       Hands: L index finger swollen ROM limited due to swelling   Neurological: She is oriented to person, place, and time.  Skin: Skin is warm. There is erythema.  Nursing note and vitals reviewed.   ED Course  Procedures (including critical care time) Labs Review Labs Reviewed  CBC WITH DIFFERENTIAL - Abnormal; Notable for the following:    WBC 13.3 (*)    RDW 15.6 (*)    Neutro Abs 9.8 (*)    Monocytes Absolute 1.1 (*)    All other components within normal limits  I-STAT CHEM 8, ED - Abnormal; Notable for the following:    BUN 42 (*)    Creatinine, Ser 1.50 (*)    Hemoglobin 16.0 (*)    HCT 47.0 (*)    All other components within normal limits    Imaging Review No results found.   EKG Interpretation None      MDM  Will start IV Unasyn and contact Hand Surgeon Spoke with Dr. Burney Gauze who reviewed photodocumentation agress with plan of IV Antibiotic, PO antibiotic and FU in 24 hours  Final diagnoses:  Animal bite  Cellulitis of finger of left hand         Garald Balding, NP 04/13/14 2008  Carmin Muskrat, MD 04/14/14 0020

## 2014-04-12 NOTE — ED Notes (Addendum)
Pt arrived to he ED with a complaint of a dog bite.  Pt was bitten by her own dog.Pt was bitten on her left hand.   Pt states she has had the dog vaccinated for rabies.  Bite occurred 12/24 at 0300 hours.  Pt states he had had slight bleeding but now has become swollen and with a throbbing sensation.

## 2014-04-12 NOTE — Discharge Instructions (Signed)
Animal Bite Animal bite wounds can get infected. It is important to get proper medical treatment. Ask your doctor if you need a rabies shot. HOME CARE   Follow your doctor's instructions for taking care of your wound.  Only take medicine as told by your doctor.  Take your medicine (antibiotics) as told. Finish them even if you start to feel better.  Keep all doctor visits as told. You may need a tetanus shot if:   You cannot remember when you had your last tetanus shot.  You have never had a tetanus shot.  The injury broke your skin. If you need a tetanus shot and you choose not to have one, you may get tetanus. Sickness from tetanus can be serious. GET HELP RIGHT AWAY IF:   Your wound is warm, red, sore, or puffy (swollen).  You notice yellowish-white fluid (pus) or a bad smell coming from the wound.  You see a red line on the skin coming from the wound.  You have a fever, chills, or you feel sick.  You feel sick to your stomach (nauseous), or you throw up (vomit).  Your pain does not go away, or it gets worse.  You have trouble moving the injured part.  You have questions or concerns. MAKE SURE YOU:   Understand these instructions.  Will watch your condition.  Will get help right away if you are not doing well or get worse. Document Released: 04/06/2005 Document Revised: 06/29/2011 Document Reviewed: 11/26/2010 [facility] Patient Information 2015 [place], [place]. This information is not intended to replace advice given to you by your health care provider. Make sure you discuss any questions you have with your health care provider. Please take the antibiotic as directed until completed I would like to see you again tomorrow evening to check the wound Please return to either [facility] between 3-11 or [facility] after 8PM for either reevaluation by Dr. Vanita Panda or myself Junius Creamer FNP)  If you hand wound looks worse you develop decreased ability to bend your  finger or the redness goes outside the outlines area please return sooner

## 2014-04-12 NOTE — ED Notes (Signed)
Loosely outline area of redness on both palm and dorsum of hand leaving about 1/4 inch margin

## 2014-04-13 ENCOUNTER — Emergency Department (HOSPITAL_COMMUNITY)
Admission: EM | Admit: 2014-04-13 | Discharge: 2014-04-13 | Disposition: A | Discharge status: Home / Self Care | Payer: Medicare Other | Attending: Emergency Medicine | Admitting: Emergency Medicine

## 2014-04-13 ENCOUNTER — Encounter (HOSPITAL_COMMUNITY): Payer: Self-pay | Admitting: Emergency Medicine

## 2014-04-13 DIAGNOSIS — M1990 Unspecified osteoarthritis, unspecified site: Secondary | ICD-10-CM | POA: Diagnosis not present

## 2014-04-13 DIAGNOSIS — S61432A Puncture wound without foreign body of left hand, initial encounter: Secondary | ICD-10-CM | POA: Diagnosis not present

## 2014-04-13 DIAGNOSIS — F419 Anxiety disorder, unspecified: Secondary | ICD-10-CM | POA: Insufficient documentation

## 2014-04-13 DIAGNOSIS — F329 Major depressive disorder, single episode, unspecified: Secondary | ICD-10-CM | POA: Diagnosis not present

## 2014-04-13 DIAGNOSIS — Z79899 Other long term (current) drug therapy: Secondary | ICD-10-CM | POA: Insufficient documentation

## 2014-04-13 DIAGNOSIS — Z8619 Personal history of other infectious and parasitic diseases: Secondary | ICD-10-CM | POA: Insufficient documentation

## 2014-04-13 DIAGNOSIS — Z87891 Personal history of nicotine dependence: Secondary | ICD-10-CM | POA: Diagnosis not present

## 2014-04-13 DIAGNOSIS — I129 Hypertensive chronic kidney disease with stage 1 through stage 4 chronic kidney disease, or unspecified chronic kidney disease: Secondary | ICD-10-CM | POA: Diagnosis not present

## 2014-04-13 DIAGNOSIS — Z8742 Personal history of other diseases of the female genital tract: Secondary | ICD-10-CM | POA: Diagnosis not present

## 2014-04-13 DIAGNOSIS — Z5189 Encounter for other specified aftercare: Secondary | ICD-10-CM

## 2014-04-13 DIAGNOSIS — N183 Chronic kidney disease, stage 3 (moderate): Secondary | ICD-10-CM | POA: Insufficient documentation

## 2014-04-13 DIAGNOSIS — Z4800 Encounter for change or removal of nonsurgical wound dressing: Secondary | ICD-10-CM | POA: Diagnosis not present

## 2014-04-13 DIAGNOSIS — K219 Gastro-esophageal reflux disease without esophagitis: Secondary | ICD-10-CM | POA: Diagnosis not present

## 2014-04-13 MED ORDER — OXYCODONE-ACETAMINOPHEN 5-325 MG PO TABS
1.0000 | ORAL_TABLET | Freq: Once | ORAL | Status: AC
  Administered 2014-04-13: 1 via ORAL
  Filled 2014-04-13: qty 1

## 2014-04-13 NOTE — ED Notes (Signed)
Bed: [facility] Expected date:  Expected time:  Means of arrival:  Comments:

## 2014-04-13 NOTE — ED Notes (Addendum)
Pt seen here yesterday for dog bite and cellulitis. Pt was given abx and was told to return today for wound check. Redness and swelling has decreased since visit yesterday, however pt now having some purulent drainage from wound bed. Pt seen by Vanita Panda MD yesterday.

## 2014-04-13 NOTE — ED Provider Notes (Signed)
CSN: [id number]     Arrival date & time 04/13/14  1825 History   First MD Initiated Contact with Patient 04/13/14 1905     Chief Complaint  Patient presents with  . Wound Check     (Consider location/radiation/quality/duration/timing/severity/associated sxs/prior Treatment) HPI  Patient presents one day after initially being evaluated at this facility for dog bite. Patient initially presented approximately 15 hours after the bite. History the patient received IV antibiotics, was started on oral antibiotics, and after discussion with hand surgery was discharged. She states that in the interval she has had no new fever, chills, nausea, vomiting, loss of strength or function of the hand. The erythema and edema have diminished She takes all medication as directed.   Past Medical History  Diagnosis Date  . Depression   . HTN (hypertension)   . Perianal pain     Chronic, Post-Op  . 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 Ulcerative colitis     Dr Sharlett Iles  . Anxiety   . Low back pain     FMS  . GERD (gastroesophageal reflux disease)   . Osteoarthritis     Dr Alvan Dame; both knees  . Enteritis due to Clostridium difficile 08/16/2012  . Crohn's disease   . Chronic kidney disease     stage 3    Past Surgical History  Procedure Laterality Date  . Ileostomy    . Colectomy  2001    ulcerative colitis  . Breath tek h pylori N/A 12/05/2013    Procedure: BREATH TEK H PYLORI;  Surgeon: Pedro Earls, MD;  Location: [facility];  Service: General;  Laterality: N/A;   Family History  Problem Relation Age of Onset  . Hypertension    . Heart disease Father 19    CHF  . Depression Sister    History  Substance Use Topics  . Smoking status: Former Research scientist (life sciences)  . Smokeless tobacco: Never Used  . Alcohol Use: No   OB History    No data available     Review of Systems  All other systems reviewed and are negative.     Allergies  Amphetamine-dextroamphet er; Cymbalta;  Erythromycin ethylsuccinate; and Morphine  Home Medications   Prior to Admission medications   Medication Sig Start Date End Date Taking? Authorizing Provider  amoxicillin-clavulanate (AUGMENTIN) 875-125 MG per tablet Take 1 tablet by mouth 2 (two) times daily. Patient taking differently: Take 1 tablet by mouth 2 (two) times daily. Started 12/25 for 10 days 04/12/14  Yes Garald Balding, NP  bifidobacterium infantis (ALIGN) capsule Take 1 capsule by mouth daily.     Yes Historical Provider, MD  calcium carbonate (OS-CAL) 600 MG TABS tablet Take 600 mg by mouth daily with breakfast.   Yes Historical Provider, MD  Cholecalciferol 1000 UNITS tablet Take 3,000 Units by mouth daily.     Yes Historical Provider, MD  cyanocobalamin (,VITAMIN B-12,) 1000 MCG/ML injection Inject 1 mL (1,000 mcg total) into the muscle every 30 (thirty) days. 02/14/14  Yes Aleksei Plotnikov V, MD  cyclobenzaprine (FLEXERIL) 5 MG tablet TAKE 1-2 TABLETS BY MOUTH AT BEDTIME AND MAY REPEAT DOSE ONE TIME IF NEEDED 03/06/14  Yes Aleksei Plotnikov V, MD  hydrochlorothiazide (MICROZIDE) 12.5 MG capsule TAKE ONE CAPSULE BY MOUTH EVERY DAY   Yes Aleksei Plotnikov V, MD  Multiple Vitamin (MULTIVITAMIN WITH MINERALS) TABS tablet Take 1 tablet by mouth daily.   Yes Historical Provider, MD  oxyCODONE (ROXICODONE) 15 MG immediate release tablet Take 1  tablet (15 mg total) by mouth 4 (four) times daily. Please fill on or after  04/17/14 02/14/14  Yes Aleksei Plotnikov V, MD  oxyCODONE-acetaminophen (PERCOCET/ROXICET) 5-325 MG per tablet Take 1 tablet by mouth every 6 (six) hours as needed for severe pain. 04/12/14  Yes Garald Balding, NP  phentermine (ADIPEX-P) 37.5 MG tablet TAKE 1 TABLET BY MOUTH DAILY BEFORE BREAKFAST 02/14/14  Yes Aleksei Plotnikov V, MD  SYRINGE-NEEDLE, DISP, 3 ML (B-D INTE[employer]RA SYRINGE) 23G X 1" 3 ML MISC 1 Device by Does not apply route every 30 (thirty) days. For B12 shots 02/14/14  Yes Aleksei Plotnikov V, MD   PARoxetine (PAXIL) 10 MG tablet Take 1 tablet (10 mg total) by mouth at bedtime. Patient not taking: Reported on 04/12/2014 02/14/14   Aleksei Plotnikov V, MD   BP 147/82 mmHg  Pulse 97  Temp(Src) 97.8 F (36.6 C) (Oral)  Resp 16  SpO2 98% Physical Exam  Constitutional: She is oriented to person, place, and time. She appears well-developed and well-nourished. No distress.  HENT:  Head: Normocephalic and atraumatic.  Eyes: Conjunctivae and EOM are normal.  Cardiovascular: Normal rate and regular rhythm.   Pulmonary/Chest: No stridor. No respiratory distress.  Musculoskeletal: She exhibits no edema.  The erythema of the hand has reduced, and there is no longer cephalid progression on the dorsal surface. Range of motion of all digits is appropriate.  There remains some small discharge from the most prominent lesion on the lateral posterior of the second digit. No active bleeding.  Neurological: She is alert and oriented to person, place, and time. No cranial nerve deficit.  Skin: Skin is warm and dry.  Psychiatric: She has a normal mood and affect.  Nursing note and vitals reviewed.   ED Course  Procedures (including critical care time) In comparison to yesterday's photos, the wound is improved. There is a small area of drainage, but the patient deferred my suggestion to incise and open up the wound for more rapid drainage.   MDM   Final diagnoses:  Visit for wound check      The patient presents one day after initial evaluation for animal bite.  Patient is afebrile, denies interval worsening, and on exam patient has improvement of the wound. After close inspection, there does remain a small amount of discharge from the most prominent area of infection, but otherwise the wound is substantially improved. Patient will continue antibiotics, close home monitoring, and will follow up with hand surgery as needed.   Carmin Muskrat, MD 04/13/14 [phone]

## 2014-04-13 NOTE — Discharge Instructions (Signed)
As discussed, today's evaluation is largely reassuring.  However, it is important that you monitor your infection carefully, and do not stay to return for concerning changes in your condition.  If the wound does not improve substantially, please be sure to follow-up with our hand surgeon Monday.  Otherwise, please follow the home care instructions, and be sure to take all medication as directed.

## 2014-04-13 NOTE — ED Notes (Signed)
Assessed wound, Swelling significantly decreased, pt decreased warmth in the wound region, still c/o pain 8/10, remarked on refilling Rx from yesterdays visit, RN advised pt to finish abx as well. Also advised not to cover the puncture wounds as it would delay healing. Pt Verbalized understanding of the teaching.

## 2014-05-02 DIAGNOSIS — I10 Essential (primary) hypertension: Secondary | ICD-10-CM | POA: Diagnosis not present

## 2014-05-02 DIAGNOSIS — D649 Anemia, unspecified: Secondary | ICD-10-CM | POA: Diagnosis not present

## 2014-05-02 DIAGNOSIS — N183 Chronic kidney disease, stage 3 (moderate): Secondary | ICD-10-CM | POA: Diagnosis not present

## 2014-05-02 DIAGNOSIS — M170 Bilateral primary osteoarthritis of knee: Secondary | ICD-10-CM | POA: Diagnosis not present

## 2014-05-17 ENCOUNTER — Ambulatory Visit (INDEPENDENT_AMBULATORY_CARE_PROVIDER_SITE_OTHER): Payer: Medicare Other | Admitting: Internal Medicine

## 2014-05-17 ENCOUNTER — Encounter: Payer: Self-pay | Admitting: Internal Medicine

## 2014-05-17 VITALS — BP 120/74 | HR 104 | Temp 97.9°F | Ht 64.0 in | Wt 265.8 lb

## 2014-05-17 DIAGNOSIS — Z23 Encounter for immunization: Secondary | ICD-10-CM | POA: Diagnosis not present

## 2014-05-17 DIAGNOSIS — M5441 Lumbago with sciatica, right side: Secondary | ICD-10-CM

## 2014-05-17 DIAGNOSIS — I10 Essential (primary) hypertension: Secondary | ICD-10-CM | POA: Diagnosis not present

## 2014-05-17 DIAGNOSIS — E538 Deficiency of other specified B group vitamins: Secondary | ICD-10-CM

## 2014-05-17 MED ORDER — PHENTERMINE HCL 37.5 MG PO TABS: ORAL_TABLET | ORAL | Status: DC

## 2014-05-17 MED ORDER — OXYCODONE HCL 15 MG PO TABS: 15.0000 mg | ORAL_TABLET | Freq: Four times a day (QID) | ORAL | Status: DC

## 2014-05-17 NOTE — Assessment & Plan Note (Signed)
Continue with current prescription therapy as reflected on the Med list.  

## 2014-05-17 NOTE — Progress Notes (Signed)
Pre visit review using our clinic review tool, if applicable. No additional management support is needed unless otherwise documented below in the visit note. 

## 2014-05-17 NOTE — Assessment & Plan Note (Signed)
Potential benefits of a long term phentermine  use as well as potential risks  and complications were explained to the patient and were aknowledged. Continue with current prescription therapy as reflected on the Med list.

## 2014-05-17 NOTE — Progress Notes (Signed)
Subjective:    HPI  Her dog Prince bit her hand - it was infected  The patient presents for a follow-up of FMS, abn labs, CRI, chronic hypertension, chronic LBP.   LBP is worse. Pain is worse. Pain is controlled for 2 hrs after pain meds F/u obesity she went to a bariatric seminar, saw Dr Hassell Done, had a CT: 2 hernias.   She may be a candidate for a sleeve procedure.  She is on gluten and milk free diet S/p Colectomy - 2001   BP Readings from Last 3 Encounters:  05/17/14 120/74  04/13/14 147/82  02/14/14 130/90   Wt Readings from Last 3 Encounters:  05/17/14 265 lb 12 oz (120.543 kg)  04/10/14 257 lb (116.574 kg)  03/13/14 259 lb 4.8 oz (117.618 kg)       Review of Systems  Constitutional: Positive for fatigue and unexpected weight change. Negative for chills, activity change and appetite change.  HENT: Positive for postnasal drip. Negative for congestion, dental problem, mouth sores, rhinorrhea, sinus pressure and voice change.   Eyes: Negative for visual disturbance.  Respiratory: Negative for cough, chest tightness and shortness of breath.   Cardiovascular: Negative for chest pain and palpitations.  Gastrointestinal: Positive for abdominal distention. Negative for nausea, vomiting, abdominal pain, diarrhea and blood in stool.  Genitourinary: Negative for dysuria, urgency, frequency, difficulty urinating and vaginal pain.  Musculoskeletal: Positive for myalgias, back pain and arthralgias. Negative for joint swelling and gait problem.  Skin: Negative for pallor and rash.  Neurological: Negative for dizziness, tremors, weakness, numbness and headaches.  Psychiatric/Behavioral: Positive for dysphoric mood. Negative for suicidal ideas, confusion and sleep disturbance. The patient is nervous/anxious.        Objective:   Physical Exam  Constitutional: She appears well-developed. No distress.  Obese  HENT:  Head: Normocephalic.  Right Ear: External ear normal.  Left  Ear: External ear normal.  Nose: Nose normal.  Mouth/Throat: Oropharynx is clear and moist.  Eyes: Conjunctivae are normal. Pupils are equal, round, and reactive to light. Right eye exhibits no discharge. Left eye exhibits no discharge. No scleral icterus.  Neck: Normal range of motion. Neck supple. No JVD present. No tracheal deviation present. No thyromegaly present.  Cardiovascular: Normal rate, regular rhythm and normal heart sounds.   Pulmonary/Chest: No stridor. No respiratory distress. She has no wheezes.  Abdominal: Soft. Bowel sounds are normal. She exhibits no distension and no mass. There is no tenderness. There is no rebound and no guarding.  colostomy  Musculoskeletal: She exhibits tenderness. She exhibits no edema.  LS, B knees - tender  Lymphadenopathy:    She has no cervical adenopathy.  Neurological: She displays normal reflexes. No cranial nerve deficit. She exhibits normal muscle tone. Coordination normal.  Skin: No rash noted. No erythema.  Psychiatric: Her behavior is normal. Judgment and thought content normal.  sad  pouch  Lab Results  Component Value Date   WBC 13.3* 04/12/2014   HGB 16.0* 04/12/2014   HCT 47.0* 04/12/2014   PLT 296 04/12/2014   GLUCOSE 94 04/12/2014   CHOL 172 05/27/2010   TRIG 100.0 05/27/2010   HDL 46.30 05/27/2010   LDLCALC 106* 05/27/2010   ALT 11 12/05/2013   AST 17 12/05/2013   NA 137 04/12/2014   K 3.5 04/12/2014   CL 98 04/12/2014   CREATININE 1.50* 04/12/2014   BUN 42* 04/12/2014   CO2 22 12/05/2013   TSH 0.825 12/05/2013   HGBA1C 5.9 12/17/2009  MICROALBUR 0.6 02/10/2013          Assessment & Plan:

## 2014-05-17 NOTE — Assessment & Plan Note (Signed)
Continue with current prescription therapy as reflected on the Med list.  Potential benefits of a long term opioids use as well as potential risks (i.e. addiction risk, apnea etc) and complications (i.e. Somnolence, constipation and others) were explained to the patient and were aknowledged.  

## 2014-05-22 ENCOUNTER — Telehealth: Payer: Self-pay | Admitting: Internal Medicine

## 2014-05-22 NOTE — Telephone Encounter (Signed)
emmi emailed °

## 2014-05-28 ENCOUNTER — Encounter: Payer: Self-pay | Admitting: Internal Medicine

## 2014-05-28 ENCOUNTER — Ambulatory Visit (INDEPENDENT_AMBULATORY_CARE_PROVIDER_SITE_OTHER): Payer: Medicare Other | Admitting: Internal Medicine

## 2014-05-28 VITALS — BP 138/80 | HR 91 | Temp 98.7°F

## 2014-05-28 DIAGNOSIS — J069 Acute upper respiratory infection, unspecified: Secondary | ICD-10-CM | POA: Diagnosis not present

## 2014-05-28 MED ORDER — BENZONATATE 200 MG PO CAPS: 200.0000 mg | ORAL_CAPSULE | Freq: Three times a day (TID) | ORAL | Status: DC | PRN

## 2014-05-28 MED ORDER — LEVOFLOXACIN 500 MG PO TABS: 500.0000 mg | ORAL_TABLET | Freq: Every day | ORAL | Status: DC

## 2014-05-28 NOTE — Assessment & Plan Note (Signed)
Po abx CXR if not better Tessalon prn

## 2014-05-28 NOTE — Progress Notes (Signed)
Subjective:    Cough This is a new problem. The current episode started in the past 7 days. The problem has been gradually worsening. The cough is productive of purulent sputum. Associated symptoms include chest pain, chills, myalgias and postnasal drip. Pertinent negatives include no headaches, hemoptysis, rash, rhinorrhea or shortness of breath.  Ear Fullness  Associated symptoms include coughing. Pertinent negatives include no abdominal pain, diarrhea, headaches, rash, rhinorrhea or vomiting.    Her dog Alfonse Spruce bit her hand - it was infected a while ago  The patient presents for a follow-up of FMS, abn labs, CRI, chronic hypertension, chronic LBP.   LBP is worse. Pain is worse. Pain is controlled for 2 hrs after pain meds F/u obesity she went to a bariatric seminar, saw Dr Hassell Done, had a CT: 2 hernias.   She may be a candidate for a sleeve procedure.  She is on gluten and milk free diet S/p Colectomy - 2001   BP Readings from Last 3 Encounters:  05/28/14 138/80  05/17/14 120/74  04/13/14 147/82   Wt Readings from Last 3 Encounters:  05/17/14 265 lb 12 oz (120.543 kg)  04/10/14 257 lb (116.574 kg)  03/13/14 259 lb 4.8 oz (117.618 kg)       Review of Systems  Constitutional: Positive for chills, fatigue and unexpected weight change. Negative for activity change and appetite change.  HENT: Positive for postnasal drip. Negative for congestion, dental problem, mouth sores, rhinorrhea, sinus pressure and voice change.   Eyes: Negative for visual disturbance.  Respiratory: Positive for cough. Negative for hemoptysis, chest tightness and shortness of breath.   Cardiovascular: Positive for chest pain. Negative for palpitations.  Gastrointestinal: Positive for abdominal distention. Negative for nausea, vomiting, abdominal pain, diarrhea and blood in stool.  Genitourinary: Negative for dysuria, urgency, frequency, difficulty urinating and vaginal pain.  Musculoskeletal: Positive  for myalgias, back pain and arthralgias. Negative for joint swelling and gait problem.  Skin: Negative for pallor and rash.  Neurological: Negative for dizziness, tremors, weakness, numbness and headaches.  Psychiatric/Behavioral: Positive for dysphoric mood. Negative for suicidal ideas, confusion and sleep disturbance. The patient is nervous/anxious.        Objective:   Physical Exam  Constitutional: She appears well-developed. No distress.  Obese  HENT:  Head: Normocephalic.  Right Ear: External ear normal.  Left Ear: External ear normal.  Nose: Nose normal.  Mouth/Throat: Oropharynx is clear and moist.  Eyes: Conjunctivae are normal. Pupils are equal, round, and reactive to light. Right eye exhibits no discharge. Left eye exhibits no discharge. No scleral icterus.  Neck: Normal range of motion. Neck supple. No JVD present. No tracheal deviation present. No thyromegaly present.  Cardiovascular: Normal rate, regular rhythm and normal heart sounds.   Pulmonary/Chest: No stridor. No respiratory distress. She has no wheezes.  Abdominal: Soft. Bowel sounds are normal. She exhibits no distension and no mass. There is no tenderness. There is no rebound and no guarding.  colostomy  Musculoskeletal: She exhibits tenderness. She exhibits no edema.  LS, B knees - tender  Lymphadenopathy:    She has no cervical adenopathy.  Neurological: She displays normal reflexes. No cranial nerve deficit. She exhibits normal muscle tone. Coordination normal.  Skin: No rash noted. No erythema.  Psychiatric: Her behavior is normal. Judgment and thought content normal.  sad  pouch  Lab Results  Component Value Date   WBC 13.3* 04/12/2014   HGB 16.0* 04/12/2014   HCT 47.0* 04/12/2014   PLT 296  04/12/2014   GLUCOSE 94 04/12/2014   CHOL 172 05/27/2010   TRIG 100.0 05/27/2010   HDL 46.30 05/27/2010   LDLCALC 106* 05/27/2010   ALT 11 12/05/2013   AST 17 12/05/2013   NA 137 04/12/2014   K 3.5  04/12/2014   CL 98 04/12/2014   CREATININE 1.50* 04/12/2014   BUN 42* 04/12/2014   CO2 22 12/05/2013   TSH 0.825 12/05/2013   HGBA1C 5.9 12/17/2009   MICROALBUR 0.6 02/10/2013          Assessment & Plan:  Patient ID: Emily Sanchez, female   DOB: 31-Dec-1955, 59 y.o.   MRN: [id number]

## 2014-05-28 NOTE — Progress Notes (Signed)
Pre visit review using our clinic review tool, if applicable. No additional management support is needed unless otherwise documented below in the visit note. 

## 2014-06-05 ENCOUNTER — Other Ambulatory Visit: Payer: Self-pay | Admitting: Internal Medicine

## 2014-06-05 NOTE — Telephone Encounter (Signed)
Pt called in and wanted to see if Dr Camila Li could call in an antiinflammatory for her knee?

## 2014-06-06 NOTE — Telephone Encounter (Signed)
Voltaren gel  Thx

## 2014-08-14 DIAGNOSIS — M1711 Unilateral primary osteoarthritis, right knee: Secondary | ICD-10-CM | POA: Diagnosis not present

## 2014-08-14 DIAGNOSIS — M170 Bilateral primary osteoarthritis of knee: Secondary | ICD-10-CM | POA: Diagnosis not present

## 2014-08-14 DIAGNOSIS — M1712 Unilateral primary osteoarthritis, left knee: Secondary | ICD-10-CM | POA: Diagnosis not present

## 2014-08-14 DIAGNOSIS — M25561 Pain in right knee: Secondary | ICD-10-CM | POA: Diagnosis not present

## 2014-08-16 ENCOUNTER — Encounter: Payer: Self-pay | Admitting: Internal Medicine

## 2014-08-16 ENCOUNTER — Ambulatory Visit (INDEPENDENT_AMBULATORY_CARE_PROVIDER_SITE_OTHER): Payer: Medicare Other | Admitting: Internal Medicine

## 2014-08-16 VITALS — BP 138/84 | HR 85 | Wt 263.0 lb

## 2014-08-16 DIAGNOSIS — M25569 Pain in unspecified knee: Secondary | ICD-10-CM | POA: Diagnosis not present

## 2014-08-16 DIAGNOSIS — I10 Essential (primary) hypertension: Secondary | ICD-10-CM | POA: Diagnosis not present

## 2014-08-16 DIAGNOSIS — J309 Allergic rhinitis, unspecified: Secondary | ICD-10-CM | POA: Insufficient documentation

## 2014-08-16 DIAGNOSIS — M5440 Lumbago with sciatica, unspecified side: Secondary | ICD-10-CM

## 2014-08-16 DIAGNOSIS — E538 Deficiency of other specified B group vitamins: Secondary | ICD-10-CM | POA: Diagnosis not present

## 2014-08-16 DIAGNOSIS — J301 Allergic rhinitis due to pollen: Secondary | ICD-10-CM

## 2014-08-16 MED ORDER — LORATADINE 10 MG PO TABS: 10.0000 mg | ORAL_TABLET | Freq: Every day | ORAL | Status: DC

## 2014-08-16 MED ORDER — PHENTERMINE HCL 37.5 MG PO TABS: ORAL_TABLET | ORAL | Status: DC

## 2014-08-16 MED ORDER — OXYCODONE HCL 15 MG PO TABS: 15.0000 mg | ORAL_TABLET | Freq: Four times a day (QID) | ORAL | Status: DC

## 2014-08-16 MED ORDER — HYDROCHLOROTHIAZIDE 12.5 MG PO CAPS: 12.5000 mg | ORAL_CAPSULE | Freq: Every day | ORAL | Status: DC

## 2014-08-16 NOTE — Progress Notes (Signed)
Pre visit review using our clinic review tool, if applicable. No additional management support is needed unless otherwise documented below in the visit note. 

## 2014-08-16 NOTE — Assessment & Plan Note (Signed)
On B12 

## 2014-08-16 NOTE — Assessment & Plan Note (Signed)
HCTZ

## 2014-08-16 NOTE — Assessment & Plan Note (Signed)
Start Claritin

## 2014-08-16 NOTE — Assessment & Plan Note (Signed)
Chronic  On Oxycodone  Potential benefits of a long term opioids use as well as potential risks (i.e. addiction risk, apnea etc) and complications (i.e. Somnolence, constipation and others) were explained to the patient and were aknowledged.

## 2014-08-16 NOTE — Progress Notes (Signed)
Subjective:    Ear Fullness  There is pain in both ears. This is a new problem. The current episode started in the past 7 days. The problem has been unchanged. There has been no fever. The pain is mild. Associated symptoms include coughing, headaches and rhinorrhea. Pertinent negatives include no abdominal pain, diarrhea, ear discharge or vomiting. Associated symptoms comments: Sneezing . The treatment provided no relief.    Hx: Her dog Alfonse Spruce bit her hand - it was infected a while ago, doing well  The patient presents for a follow-up of FMS, abn labs, CRI, chronic hypertension, chronic LBP.   LBP is worse. Pain is worse. Pain is controlled for 2 hrs after pain meds F/u obesity she went to a bariatric seminar, saw Dr Hassell Done, had a CT: 2 hernias.   She may be a candidate for a sleeve procedure.  She is on gluten and milk free diet S/p Colectomy - 2001   BP Readings from Last 3 Encounters:  08/16/14 138/88  05/28/14 138/80  05/17/14 120/74   Wt Readings from Last 3 Encounters:  08/16/14 263 lb (119.296 kg)  05/17/14 265 lb 12 oz (120.543 kg)  04/10/14 257 lb (116.574 kg)       Review of Systems  Constitutional: Positive for fatigue and unexpected weight change. Negative for activity change and appetite change.  HENT: Positive for rhinorrhea. Negative for congestion, dental problem, ear discharge, mouth sores, sinus pressure and voice change.   Eyes: Negative for visual disturbance.  Respiratory: Positive for cough. Negative for chest tightness.   Cardiovascular: Negative for palpitations.  Gastrointestinal: Positive for abdominal distention. Negative for nausea, vomiting, abdominal pain, diarrhea and blood in stool.  Genitourinary: Negative for dysuria, urgency, frequency, difficulty urinating and vaginal pain.  Musculoskeletal: Positive for back pain and arthralgias. Negative for joint swelling and gait problem.  Skin: Negative for pallor.  Neurological: Positive for  headaches. Negative for dizziness, tremors, weakness and numbness.  Psychiatric/Behavioral: Positive for dysphoric mood. Negative for suicidal ideas, confusion and sleep disturbance. The patient is nervous/anxious.        Objective:   Physical Exam  Constitutional: She appears well-developed. No distress.  Obese  HENT:  Head: Normocephalic.  Right Ear: External ear normal.  Left Ear: External ear normal.  Nose: Nose normal.  Mouth/Throat: Oropharynx is clear and moist.  Eyes: Conjunctivae are normal. Pupils are equal, round, and reactive to light. Right eye exhibits no discharge. Left eye exhibits no discharge. No scleral icterus.  Neck: Normal range of motion. Neck supple. No JVD present. No tracheal deviation present. No thyromegaly present.  Cardiovascular: Normal rate, regular rhythm and normal heart sounds.   Pulmonary/Chest: No stridor. No respiratory distress. She has no wheezes.  Abdominal: Soft. Bowel sounds are normal. She exhibits no distension and no mass. There is no tenderness. There is no rebound and no guarding.  colostomy  Musculoskeletal: She exhibits tenderness. She exhibits no edema.  LS, B knees - tender  Lymphadenopathy:    She has no cervical adenopathy.  Neurological: She displays normal reflexes. No cranial nerve deficit. She exhibits normal muscle tone. Coordination normal.  Skin: No rash noted. No erythema.  Psychiatric: Her behavior is normal. Judgment and thought content normal.  sad  pouch  Lab Results  Component Value Date   WBC 13.3* 04/12/2014   HGB 16.0* 04/12/2014   HCT 47.0* 04/12/2014   PLT 296 04/12/2014   GLUCOSE 94 04/12/2014   CHOL 172 05/27/2010   TRIG 100.0  05/27/2010   HDL 46.30 05/27/2010   LDLCALC 106* 05/27/2010   ALT 11 12/05/2013   AST 17 12/05/2013   NA 137 04/12/2014   K 3.5 04/12/2014   CL 98 04/12/2014   CREATININE 1.50* 04/12/2014   BUN 42* 04/12/2014   CO2 22 12/05/2013   TSH 0.825 12/05/2013   HGBA1C 5.9  12/17/2009   MICROALBUR 0.6 02/10/2013          Assessment & Plan:

## 2014-08-16 NOTE — Assessment & Plan Note (Signed)
B knee OA Dr Alvan Dame  Chronic  On Oxycodone  Potential benefits of a long term opioids use as well as potential risks (i.e. addiction risk, apnea etc) and complications (i.e. Somnolence, constipation and others) were explained to the patient and were aknowledged.  Just had injections

## 2014-08-21 DIAGNOSIS — Z0279 Encounter for issue of other medical certificate: Secondary | ICD-10-CM

## 2014-08-21 DIAGNOSIS — H52223 Regular astigmatism, bilateral: Secondary | ICD-10-CM | POA: Diagnosis not present

## 2014-08-21 DIAGNOSIS — H5213 Myopia, bilateral: Secondary | ICD-10-CM | POA: Diagnosis not present

## 2014-08-21 DIAGNOSIS — H18211 Corneal edema secondary to contact lens, right eye: Secondary | ICD-10-CM | POA: Diagnosis not present

## 2014-08-21 DIAGNOSIS — H04123 Dry eye syndrome of bilateral lacrimal glands: Secondary | ICD-10-CM | POA: Diagnosis not present

## 2014-08-21 DIAGNOSIS — H2513 Age-related nuclear cataract, bilateral: Secondary | ICD-10-CM | POA: Diagnosis not present

## 2014-08-21 DIAGNOSIS — H43393 Other vitreous opacities, bilateral: Secondary | ICD-10-CM | POA: Diagnosis not present

## 2014-08-21 DIAGNOSIS — H524 Presbyopia: Secondary | ICD-10-CM | POA: Diagnosis not present

## 2014-09-14 DIAGNOSIS — M1712 Unilateral primary osteoarthritis, left knee: Secondary | ICD-10-CM | POA: Diagnosis not present

## 2014-09-14 DIAGNOSIS — M1711 Unilateral primary osteoarthritis, right knee: Secondary | ICD-10-CM | POA: Diagnosis not present

## 2014-11-15 ENCOUNTER — Other Ambulatory Visit (INDEPENDENT_AMBULATORY_CARE_PROVIDER_SITE_OTHER): Payer: Medicare Other

## 2014-11-15 ENCOUNTER — Ambulatory Visit (INDEPENDENT_AMBULATORY_CARE_PROVIDER_SITE_OTHER): Payer: Medicare Other | Admitting: Internal Medicine

## 2014-11-15 ENCOUNTER — Encounter: Payer: Self-pay | Admitting: Internal Medicine

## 2014-11-15 VITALS — BP 120/88 | HR 100 | Wt 262.0 lb

## 2014-11-15 DIAGNOSIS — I10 Essential (primary) hypertension: Secondary | ICD-10-CM

## 2014-11-15 DIAGNOSIS — M5440 Lumbago with sciatica, unspecified side: Secondary | ICD-10-CM

## 2014-11-15 DIAGNOSIS — E538 Deficiency of other specified B group vitamins: Secondary | ICD-10-CM

## 2014-11-15 LAB — BASIC METABOLIC PANEL
BUN: 23 mg/dL (ref 6–23)
CALCIUM: 10.1 mg/dL (ref 8.4–10.5)
CHLORIDE: 99 meq/L (ref 96–112)
CO2: 34 mEq/L — ABNORMAL HIGH (ref 19–32)
CREATININE: 1.12 mg/dL (ref 0.40–1.20)
GFR: 64.01 mL/min (ref >60.00)
Glucose, Bld: 100 mg/dL — ABNORMAL HIGH (ref 70–99)
Potassium: 3.9 mEq/L (ref 3.5–5.1)
Sodium: 140 mEq/L (ref 135–145)

## 2014-11-15 MED ORDER — OXYCODONE HCL 15 MG PO TABS: 15.0000 mg | ORAL_TABLET | Freq: Four times a day (QID) | ORAL | Status: DC

## 2014-11-15 MED ORDER — CYANOCOBALAMIN 1000 MCG/ML IJ SOLN: 1000.0000 ug | INTRAMUSCULAR | Status: DC

## 2014-11-15 MED ORDER — PHENTERMINE HCL 37.5 MG PO TABS: ORAL_TABLET | ORAL | Status: DC

## 2014-11-15 MED ORDER — "SYRINGE/NEEDLE (DISP) 23G X 1"" 3 ML MISC": 1.0000 | Status: DC

## 2014-11-15 NOTE — Assessment & Plan Note (Signed)
ON B12

## 2014-11-15 NOTE — Assessment & Plan Note (Addendum)
On Phentermine Potential benefits of a long term phentermine  use as well as potential risks  and complications were explained to the patient and were aknowledged.

## 2014-11-15 NOTE — Progress Notes (Signed)
Pre visit review using our clinic review tool, if applicable. No additional management support is needed unless otherwise documented below in the visit note. 

## 2014-11-15 NOTE — Progress Notes (Signed)
Subjective:  Patient ID: Emily Sanchez, female    DOB: [birth year]  Age: 59 y.o. MRN: [id number]  CC: No chief complaint on file.   HPI Emily Sanchez presents for HTN, chronic OA, B12 def, LBP f/u  Outpatient Prescriptions Prior to Visit  Medication Sig Dispense Refill  . bifidobacterium infantis (ALIGN) capsule Take 1 capsule by mouth daily.      . calcium carbonate (OS-CAL) 600 MG TABS tablet Take 600 mg by mouth daily with breakfast.    . Cholecalciferol 1000 UNITS tablet Take 3,000 Units by mouth daily.      . cyanocobalamin (,VITAMIN B-12,) 1000 MCG/ML injection Inject 1 mL (1,000 mcg total) into the muscle every 30 (thirty) days. 10 mL 11  . cyclobenzaprine (FLEXERIL) 5 MG tablet TAKE 1-2 TABLETS BY MOUTH AT BEDTIME AND MAY REPEAT DOSE ONE TIME IF NEEDED 60 tablet 2  . hydrochlorothiazide (MICROZIDE) 12.5 MG capsule Take 1 capsule (12.5 mg total) by mouth daily. 90 capsule 3  . Multiple Vitamin (MULTIVITAMIN WITH MINERALS) TABS tablet Take 1 tablet by mouth daily.    Marland Kitchen oxyCODONE (ROXICODONE) 15 MG immediate release tablet Take 1 tablet (15 mg total) by mouth 4 (four) times daily. Please fill on or after  10/16/14 120 tablet 0  . phentermine (ADIPEX-P) 37.5 MG tablet TAKE 1 TABLET BY MOUTH DAILY BEFORE BREAKFAST 30 tablet 2  . SYRINGE-NEEDLE, DISP, 3 ML (B-D INTE[employer]RA SYRINGE) 23G X 1" 3 ML MISC 1 Device by Does not apply route every 30 (thirty) days. For B12 shots 50 each 3  . levofloxacin (LEVAQUIN) 500 MG tablet Take 1 tablet (500 mg total) by mouth daily. 7 tablet 1  . benzonatate (TESSALON) 200 MG capsule Take 1 capsule (200 mg total) by mouth 3 (three) times daily as needed for cough. (Patient not taking: Reported on 11/15/2014) 60 capsule 0  . loratadine (CLARITIN) 10 MG tablet Take 1 tablet (10 mg total) by mouth daily. (Patient not taking: Reported on 11/15/2014) 100 tablet 3   No facility-administered medications prior to visit.    ROS Review of Systems  Constitutional:  Positive for fatigue. Negative for chills, activity change, appetite change and unexpected weight change.  HENT: Negative for congestion, mouth sores and sinus pressure.   Eyes: Negative for visual disturbance.  Respiratory: Negative for cough and chest tightness.   Gastrointestinal: Negative for nausea and abdominal pain.  Genitourinary: Negative for frequency, difficulty urinating and vaginal pain.  Musculoskeletal: Positive for back pain and arthralgias. Negative for gait problem.  Skin: Negative for pallor and rash.  Neurological: Negative for dizziness, tremors, weakness, numbness and headaches.  Psychiatric/Behavioral: Negative for confusion, sleep disturbance and dysphoric mood. The patient is nervous/anxious.     Objective:  BP 120/88 mmHg  Pulse 100  Wt 262 lb (118.842 kg)  SpO2 95%  BP Readings from Last 3 Encounters:  11/15/14 120/88  08/16/14 138/84  05/28/14 138/80    Wt Readings from Last 3 Encounters:  11/15/14 262 lb (118.842 kg)  08/16/14 263 lb (119.296 kg)  05/17/14 265 lb 12 oz (120.543 kg)    Physical Exam  Constitutional: She appears well-developed. No distress.  Obese  HENT:  Head: Normocephalic.  Right Ear: External ear normal.  Left Ear: External ear normal.  Nose: Nose normal.  Mouth/Throat: Oropharynx is clear and moist.  Eyes: Conjunctivae are normal. Pupils are equal, round, and reactive to light. Right eye exhibits no discharge. Left eye exhibits no discharge. No scleral icterus.  Neck: Normal  range of motion. Neck supple. No JVD present. No tracheal deviation present. No thyromegaly present.  Cardiovascular: Normal rate, regular rhythm and normal heart sounds.   Pulmonary/Chest: No stridor. No respiratory distress. She has no wheezes. She exhibits no tenderness.  Abdominal: Soft. Bowel sounds are normal. She exhibits no distension and no mass. There is no tenderness. There is no rebound and no guarding.  Musculoskeletal: She exhibits  tenderness. She exhibits no edema.  Lymphadenopathy:    She has no cervical adenopathy.  Neurological: She displays normal reflexes. No cranial nerve deficit. She exhibits normal muscle tone. Coordination normal.  Skin: No rash noted. No erythema.  Psychiatric: She has a normal mood and affect. Her behavior is normal. Judgment and thought content normal.    Lab Results  Component Value Date   WBC 13.3* 04/12/2014   HGB 16.0* 04/12/2014   HCT 47.0* 04/12/2014   PLT 296 04/12/2014   GLUCOSE 94 04/12/2014   CHOL 172 05/27/2010   TRIG 100.0 05/27/2010   HDL 46.30 05/27/2010   LDLCALC 106* 05/27/2010   ALT 11 12/05/2013   AST 17 12/05/2013   NA 137 04/12/2014   K 3.5 04/12/2014   CL 98 04/12/2014   CREATININE 1.50* 04/12/2014   BUN 42* 04/12/2014   CO2 22 12/05/2013   TSH 0.825 12/05/2013   HGBA1C 5.9 12/17/2009   MICROALBUR 0.6 02/10/2013    No results found.  Assessment & Plan:   There are no diagnoses linked to this encounter. I have discontinued Ms. Sorn levofloxacin. I am also having her maintain her bifidobacterium infantis, Cholecalciferol, cyanocobalamin, SYRINGE-NEEDLE (DISP) 3 ML, multivitamin with minerals, calcium carbonate, benzonatate, cyclobenzaprine, hydrochlorothiazide, phentermine, loratadine, and oxyCODONE.  No orders of the defined types were placed in this encounter.     Follow-up: No Follow-up on file.  Walker Kehr, MD

## 2014-11-15 NOTE — Assessment & Plan Note (Signed)
On HCTZ

## 2014-11-15 NOTE — Assessment & Plan Note (Signed)
Chronic  On Oxycodone  Potential benefits of a long term opioids use as well as potential risks (i.e. addiction risk, apnea etc) and complications (i.e. Somnolence, constipation and others) were explained to the patient and were aknowledged. 7/14 worse

## 2014-12-21 DIAGNOSIS — M25561 Pain in right knee: Secondary | ICD-10-CM | POA: Diagnosis not present

## 2014-12-21 DIAGNOSIS — M1712 Unilateral primary osteoarthritis, left knee: Secondary | ICD-10-CM | POA: Diagnosis not present

## 2014-12-21 DIAGNOSIS — M170 Bilateral primary osteoarthritis of knee: Secondary | ICD-10-CM | POA: Diagnosis not present

## 2014-12-21 DIAGNOSIS — M1711 Unilateral primary osteoarthritis, right knee: Secondary | ICD-10-CM | POA: Diagnosis not present

## 2015-01-16 ENCOUNTER — Ambulatory Visit (INDEPENDENT_AMBULATORY_CARE_PROVIDER_SITE_OTHER): Payer: Medicare Other | Admitting: Psychology

## 2015-01-16 DIAGNOSIS — E669 Obesity, unspecified: Secondary | ICD-10-CM

## 2015-01-23 ENCOUNTER — Ambulatory Visit (INDEPENDENT_AMBULATORY_CARE_PROVIDER_SITE_OTHER): Payer: Medicare Other | Admitting: Psychology

## 2015-02-27 ENCOUNTER — Ambulatory Visit (INDEPENDENT_AMBULATORY_CARE_PROVIDER_SITE_OTHER): Payer: Medicare Other | Admitting: Internal Medicine

## 2015-02-27 ENCOUNTER — Encounter: Payer: Self-pay | Admitting: Internal Medicine

## 2015-02-27 ENCOUNTER — Other Ambulatory Visit (INDEPENDENT_AMBULATORY_CARE_PROVIDER_SITE_OTHER): Payer: Medicare Other

## 2015-02-27 VITALS — BP 138/90 | HR 93 | Ht 65.0 in | Wt 269.0 lb

## 2015-02-27 DIAGNOSIS — G8929 Other chronic pain: Secondary | ICD-10-CM

## 2015-02-27 DIAGNOSIS — I10 Essential (primary) hypertension: Secondary | ICD-10-CM | POA: Diagnosis not present

## 2015-02-27 DIAGNOSIS — Z23 Encounter for immunization: Secondary | ICD-10-CM | POA: Diagnosis not present

## 2015-02-27 DIAGNOSIS — M5440 Lumbago with sciatica, unspecified side: Secondary | ICD-10-CM

## 2015-02-27 DIAGNOSIS — Z0000 Encounter for general adult medical examination without abnormal findings: Secondary | ICD-10-CM | POA: Diagnosis not present

## 2015-02-27 DIAGNOSIS — E538 Deficiency of other specified B group vitamins: Secondary | ICD-10-CM

## 2015-02-27 LAB — BASIC METABOLIC PANEL
BUN: 18 mg/dL (ref 6–23)
CALCIUM: 9.9 mg/dL (ref 8.4–10.5)
CO2: 30 mEq/L (ref 19–32)
Chloride: 102 mEq/L (ref 96–112)
Creatinine, Ser: 1.14 mg/dL (ref 0.40–1.20)
GFR: 62.65 mL/min (ref >60.00)
Glucose, Bld: 93 mg/dL (ref 70–99)
Potassium: 4.2 mEq/L (ref 3.5–5.1)
SODIUM: 140 meq/L (ref 135–145)

## 2015-02-27 LAB — VITAMIN B12: Vitamin B-12: 903 pg/mL (ref 211–911)

## 2015-02-27 LAB — TSH: TSH: 0.59 u[IU]/mL (ref 0.35–4.50)

## 2015-02-27 MED ORDER — CYANOCOBALAMIN 1000 MCG/ML IJ SOLN: 1000.0000 ug | INTRAMUSCULAR | Status: DC

## 2015-02-27 MED ORDER — OXYCODONE HCL 15 MG PO TABS: 15.0000 mg | ORAL_TABLET | Freq: Four times a day (QID) | ORAL | Status: DC

## 2015-02-27 NOTE — Assessment & Plan Note (Signed)
On HCTZ

## 2015-02-27 NOTE — Progress Notes (Signed)
Pre visit review using our clinic review tool, if applicable. No additional management support is needed unless otherwise documented below in the visit note. 

## 2015-02-27 NOTE — Assessment & Plan Note (Signed)
On Oxycodone

## 2015-02-27 NOTE — Assessment & Plan Note (Signed)
Labs On B12

## 2015-02-27 NOTE — Progress Notes (Signed)
Subjective:  Patient ID: Emily Sanchez, female    DOB: 03-Jun-1955  Age: 59 y.o. MRN: [id number]  CC: No chief complaint on file.   HPI Emily Sanchez presents for a well exam and for OA, LBP, B12 def, HTN f/u  Outpatient Prescriptions Prior to Visit  Medication Sig Dispense Refill  . bifidobacterium infantis (ALIGN) capsule Take 1 capsule by mouth daily.      . calcium carbonate (OS-CAL) 600 MG TABS tablet Take 600 mg by mouth daily with breakfast.    . Cholecalciferol 1000 UNITS tablet Take 3,000 Units by mouth daily.      . cyclobenzaprine (FLEXERIL) 5 MG tablet TAKE 1-2 TABLETS BY MOUTH AT BEDTIME AND MAY REPEAT DOSE ONE TIME IF NEEDED 60 tablet 2  . hydrochlorothiazide (MICROZIDE) 12.5 MG capsule Take 1 capsule (12.5 mg total) by mouth daily. 90 capsule 3  . loratadine (CLARITIN) 10 MG tablet Take 1 tablet (10 mg total) by mouth daily. 100 tablet 3  . Multiple Vitamin (MULTIVITAMIN WITH MINERALS) TABS tablet Take 1 tablet by mouth daily.    . phentermine (ADIPEX-P) 37.5 MG tablet TAKE 1 TABLET BY MOUTH DAILY BEFORE BREAKFAST 30 tablet 2  . SYRINGE-NEEDLE, DISP, 3 ML (B-D [employer] SYRINGE) 23G X 1" 3 ML MISC 1 Device by Does not apply route every 30 (thirty) days. For B12 shots 50 each 3  . cyanocobalamin (,VITAMIN B-12,) 1000 MCG/ML injection Inject 1 mL (1,000 mcg total) into the muscle every 30 (thirty) days. 10 mL 11  . oxyCODONE (ROXICODONE) 15 MG immediate release tablet Take 1 tablet (15 mg total) by mouth 4 (four) times daily. Please fill on or after  01/16/15 120 tablet 0   No facility-administered medications prior to visit.    ROS Review of Systems  Constitutional: Positive for fatigue. Negative for chills, activity change, appetite change and unexpected weight change.  HENT: Negative for congestion, mouth sores and sinus pressure.   Eyes: Negative for visual disturbance.  Respiratory: Negative for cough and chest tightness.   Gastrointestinal: Negative for nausea and  abdominal pain.  Genitourinary: Negative for frequency, difficulty urinating and vaginal pain.  Musculoskeletal: Positive for back pain and arthralgias.  Skin: Negative for pallor and rash.  Neurological: Negative for dizziness, tremors, weakness, numbness and headaches.  Psychiatric/Behavioral: Positive for sleep disturbance. Negative for confusion. The patient is nervous/anxious.     Objective:  BP 138/90 mmHg  Pulse 93  Ht 5' 5"  (1.651 m)  Wt 269 lb (122.018 kg)  BMI 44.76 kg/m2  SpO2 97%  BP Readings from Last 3 Encounters:  02/27/15 138/90  11/15/14 120/88  08/16/14 138/84    Wt Readings from Last 3 Encounters:  02/27/15 269 lb (122.018 kg)  11/15/14 262 lb (118.842 kg)  08/16/14 263 lb (119.296 kg)    Physical Exam  Constitutional: She appears well-developed. No distress.  HENT:  Head: Normocephalic.  Right Ear: External ear normal.  Left Ear: External ear normal.  Nose: Nose normal.  Mouth/Throat: Oropharynx is clear and moist.  Eyes: Conjunctivae are normal. Pupils are equal, round, and reactive to light. Right eye exhibits no discharge. Left eye exhibits no discharge.  Neck: Normal range of motion. Neck supple. No JVD present. No tracheal deviation present. No thyromegaly present.  Cardiovascular: Normal rate, regular rhythm and normal heart sounds.   Pulmonary/Chest: No stridor. No respiratory distress. She has no wheezes.  Abdominal: Soft. Bowel sounds are normal. She exhibits no distension and no mass. There is no tenderness.  There is no rebound and no guarding.  Musculoskeletal: She exhibits no edema or tenderness.  Lymphadenopathy:    She has no cervical adenopathy.  Neurological: She displays normal reflexes. No cranial nerve deficit. She exhibits normal muscle tone. Coordination normal.  Skin: No rash noted. No erythema.  Psychiatric: She has a normal mood and affect. Her behavior is normal. Judgment and thought content normal.  Obese  Lab Results    Component Value Date   WBC 13.3* 04/12/2014   HGB 16.0* 04/12/2014   HCT 47.0* 04/12/2014   PLT 296 04/12/2014   GLUCOSE 93 02/27/2015   CHOL 172 05/27/2010   TRIG 100.0 05/27/2010   HDL 46.30 05/27/2010   LDLCALC 106* 05/27/2010   ALT 11 12/05/2013   AST 17 12/05/2013   NA 140 02/27/2015   K 4.2 02/27/2015   CL 102 02/27/2015   CREATININE 1.14 02/27/2015   BUN 18 02/27/2015   CO2 30 02/27/2015   TSH 0.59 02/27/2015   HGBA1C 5.9 12/17/2009   MICROALBUR 0.6 02/10/2013    No results found.  Assessment & Plan:   Diagnoses and all orders for this visit:  Essential hypertension -     Basic metabolic panel; Future -     Vitamin B12; Future -     TSH; Future  B12 deficiency -     Basic metabolic panel; Future -     Vitamin B12; Future -     TSH; Future  Chronic midline low back pain with sciatica, sciatica laterality unspecified -     Basic metabolic panel; Future -     Vitamin B12; Future -     TSH; Future  Well adult exam  Need for influenza vaccination -     Flu Vaccine QUAD 36+ mos IM  Other orders -     Discontinue: oxyCODONE (ROXICODONE) 15 MG immediate release tablet; Take 1 tablet (15 mg total) by mouth 4 (four) times daily. Please fill on or after  02/27/15 -     cyanocobalamin (,VITAMIN B-12,) 1000 MCG/ML injection; Inject 1 mL (1,000 mcg total) into the muscle every 30 (thirty) days. -     Discontinue: oxyCODONE (ROXICODONE) 15 MG immediate release tablet; Take 1 tablet (15 mg total) by mouth 4 (four) times daily. Please fill on or after  03/29/15 -     oxyCODONE (ROXICODONE) 15 MG immediate release tablet; Take 1 tablet (15 mg total) by mouth 4 (four) times daily. Please fill on or after  04/29/15  I have discontinued Ms. Emily Sanchez oxyCODONE and oxyCODONE. I have also changed her oxyCODONE. Additionally, I am having her maintain her bifidobacterium infantis, Cholecalciferol, multivitamin with minerals, calcium carbonate, cyclobenzaprine, hydrochlorothiazide,  loratadine, SYRINGE-NEEDLE (DISP) 3 ML, phentermine, and cyanocobalamin.  Meds ordered this encounter  Medications  . DISCONTD: oxyCODONE (ROXICODONE) 15 MG immediate release tablet    Sig: Take 1 tablet (15 mg total) by mouth 4 (four) times daily. Please fill on or after  02/27/15    Dispense:  120 tablet    Refill:  0  . cyanocobalamin (,VITAMIN B-12,) 1000 MCG/ML injection    Sig: Inject 1 mL (1,000 mcg total) into the muscle every 30 (thirty) days.    Dispense:  10 mL    Refill:  11  . DISCONTD: oxyCODONE (ROXICODONE) 15 MG immediate release tablet    Sig: Take 1 tablet (15 mg total) by mouth 4 (four) times daily. Please fill on or after  03/29/15    Dispense:  120 tablet  Refill:  0  . oxyCODONE (ROXICODONE) 15 MG immediate release tablet    Sig: Take 1 tablet (15 mg total) by mouth 4 (four) times daily. Please fill on or after  04/29/15    Dispense:  120 tablet    Refill:  0     Follow-up: Return in about 3 months (around 05/30/2015) for a follow-up visit.  Walker Kehr, MD

## 2015-02-27 NOTE — Patient Instructions (Signed)
Preventive Care for Adults, Female A healthy lifestyle and preventive care can promote health and wellness. Preventive health guidelines for women include the following key practices.  A routine yearly physical is a good way to check with your health care provider about your health and preventive screening. It is a chance to share any concerns and updates on your health and to receive a thorough exam.  Visit your dentist for a routine exam and preventive care every 6 months. Brush your teeth twice a day and floss once a day. Good oral hygiene prevents tooth decay and gum disease.  The frequency of eye exams is based on your age, health, family medical history, use of contact lenses, and other factors. Follow your health care provider's recommendations for frequency of eye exams.  Eat a healthy diet. Foods like vegetables, fruits, whole grains, low-fat dairy products, and lean protein foods contain the nutrients you need without too many calories. Decrease your intake of foods high in solid fats, added sugars, and salt. Eat the right amount of calories for you.Get information about a proper diet from your health care provider, if necessary.  Regular physical exercise is one of the most important things you can do for your health. Most adults should get at least 150 minutes of moderate-intensity exercise (any activity that increases your heart rate and causes you to sweat) each week. In addition, most adults need muscle-strengthening exercises on 2 or more days a week.  Maintain a healthy weight. The body mass index (BMI) is a screening tool to identify possible weight problems. It provides an estimate of body fat based on height and weight. Your health care provider can find your BMI and can help you achieve or maintain a healthy weight.For adults 20 years and older:  A BMI below 18.5 is considered underweight.  A BMI of 18.5 to 24.9 is normal.  A BMI of 25 to 29.9 is considered overweight.  A  BMI of 30 and above is considered obese.  Maintain normal blood lipids and cholesterol levels by exercising and minimizing your intake of saturated fat. Eat a balanced diet with plenty of fruit and vegetables. Blood tests for lipids and cholesterol should begin at age 45 and be repeated every 5 years. If your lipid or cholesterol levels are high, you are over 50, or you are at high risk for heart disease, you may need your cholesterol levels checked more frequently.Ongoing high lipid and cholesterol levels should be treated with medicines if diet and exercise are not working.  If you smoke, find out from your health care provider how to quit. If you do not use tobacco, do not start.  Lung cancer screening is recommended for adults aged 45-80 years who are at high risk for developing lung cancer because of a history of smoking. A yearly low-dose CT scan of the lungs is recommended for people who have at least a 30-pack-year history of smoking and are a current smoker or have quit within the past 15 years. A pack year of smoking is smoking an average of 1 pack of cigarettes a day for 1 year (for example: 1 pack a day for 30 years or 2 packs a day for 15 years). Yearly screening should continue until the smoker has stopped smoking for at least 15 years. Yearly screening should be stopped for people who develop a health problem that would prevent them from having lung cancer treatment.  If you are pregnant, do not drink alcohol. If you are  breastfeeding, be very cautious about drinking alcohol. If you are not pregnant and choose to drink alcohol, do not have more than 1 drink per day. One drink is considered to be 12 ounces (355 mL) of beer, 5 ounces (148 mL) of wine, or 1.5 ounces (44 mL) of liquor.  Avoid use of street drugs. Do not share needles with anyone. Ask for help if you need support or instructions about stopping the use of drugs.  High blood pressure causes heart disease and increases the risk  of stroke. Your blood pressure should be checked at least every 1 to 2 years. Ongoing high blood pressure should be treated with medicines if weight loss and exercise do not work.  If you are 55-79 years old, ask your health care provider if you should take aspirin to prevent strokes.  Diabetes screening is done by taking a blood sample to check your blood glucose level after you have not eaten for a certain period of time (fasting). If you are not overweight and you do not have risk factors for diabetes, you should be screened once every 3 years starting at age 45. If you are overweight or obese and you are 40-70 years of age, you should be screened for diabetes every year as part of your cardiovascular risk assessment.  Breast cancer screening is essential preventive care for women. You should practice "breast self-awareness." This means understanding the normal appearance and feel of your breasts and may include breast self-examination. Any changes detected, no matter how small, should be reported to a health care provider. Women in their 20s and 30s should have a clinical breast exam (CBE) by a health care provider as part of a regular health exam every 1 to 3 years. After age 40, women should have a CBE every year. Starting at age 40, women should consider having a mammogram (breast X-ray test) every year. Women who have a family history of breast cancer should talk to their health care provider about genetic screening. Women at a high risk of breast cancer should talk to their health care providers about having an MRI and a mammogram every year.  Breast cancer gene (BRCA)-related cancer risk assessment is recommended for women who have family members with BRCA-related cancers. BRCA-related cancers include breast, ovarian, tubal, and peritoneal cancers. Having family members with these cancers may be associated with an increased risk for harmful changes (mutations) in the breast cancer genes BRCA1 and  BRCA2. Results of the assessment will determine the need for genetic counseling and BRCA1 and BRCA2 testing.  Your health care provider may recommend that you be screened regularly for cancer of the pelvic organs (ovaries, uterus, and vagina). This screening involves a pelvic examination, including checking for microscopic changes to the surface of your cervix (Pap test). You may be encouraged to have this screening done every 3 years, beginning at age 21.  For women ages 30-65, health care providers may recommend pelvic exams and Pap testing every 3 years, or they may recommend the Pap and pelvic exam, combined with testing for human papilloma virus (HPV), every 5 years. Some types of HPV increase your risk of cervical cancer. Testing for HPV may also be done on women of any age with unclear Pap test results.  Other health care providers may not recommend any screening for nonpregnant women who are considered low risk for pelvic cancer and who do not have symptoms. Ask your health care provider if a screening pelvic exam is right for   you.  If you have had past treatment for cervical cancer or a condition that could lead to cancer, you need Pap tests and screening for cancer for at least 20 years after your treatment. If Pap tests have been discontinued, your risk factors (such as having a new sexual partner) need to be reassessed to determine if screening should resume. Some women have medical problems that increase the chance of getting cervical cancer. In these cases, your health care provider may recommend more frequent screening and Pap tests.  Colorectal cancer can be detected and often prevented. Most routine colorectal cancer screening begins at the age of 50 years and continues through age 75 years. However, your health care provider may recommend screening at an earlier age if you have risk factors for colon cancer. On a yearly basis, your health care provider may provide home test kits to check  for hidden blood in the stool. Use of a small camera at the end of a tube, to directly examine the colon (sigmoidoscopy or colonoscopy), can detect the earliest forms of colorectal cancer. Talk to your health care provider about this at age 50, when routine screening begins. Direct exam of the colon should be repeated every 5-10 years through age 75 years, unless early forms of precancerous polyps or small growths are found.  People who are at an increased risk for hepatitis B should be screened for this virus. You are considered at high risk for hepatitis B if:  You were born in a country where hepatitis B occurs often. Talk with your health care provider about which countries are considered high risk.  Your parents were born in a high-risk country and you have not received a shot to protect against hepatitis B (hepatitis B vaccine).  You have HIV or AIDS.  You use needles to inject street drugs.  You live with, or have sex with, someone who has hepatitis B.  You get hemodialysis treatment.  You take certain medicines for conditions like cancer, organ transplantation, and autoimmune conditions.  Hepatitis C blood testing is recommended for all people born from 1945 through 1965 and any individual with known risks for hepatitis C.  Practice safe sex. Use condoms and avoid high-risk sexual practices to reduce the spread of sexually transmitted infections (STIs). STIs include gonorrhea, chlamydia, syphilis, trichomonas, herpes, HPV, and human immunodeficiency virus (HIV). Herpes, HIV, and HPV are viral illnesses that have no cure. They can result in disability, cancer, and death.  You should be screened for sexually transmitted illnesses (STIs) including gonorrhea and chlamydia if:  You are sexually active and are younger than 24 years.  You are older than 24 years and your health care provider tells you that you are at risk for this type of infection.  Your sexual activity has changed  since you were last screened and you are at an increased risk for chlamydia or gonorrhea. Ask your health care provider if you are at risk.  If you are at risk of being infected with HIV, it is recommended that you take a prescription medicine daily to prevent HIV infection. This is called preexposure prophylaxis (PrEP). You are considered at risk if:  You are sexually active and do not regularly use condoms or know the HIV status of your partner(s).  You take drugs by injection.  You are sexually active with a partner who has HIV.  Talk with your health care provider about whether you are at high risk of being infected with HIV. If   you choose to begin PrEP, you should first be tested for HIV. You should then be tested every 3 months for as long as you are taking PrEP.  Osteoporosis is a disease in which the bones lose minerals and strength with aging. This can result in serious bone fractures or breaks. The risk of osteoporosis can be identified using a bone density scan. Women ages 67 years and over and women at risk for fractures or osteoporosis should discuss screening with their health care providers. Ask your health care provider whether you should take a calcium supplement or vitamin D to reduce the rate of osteoporosis.  Menopause can be associated with physical symptoms and risks. Hormone replacement therapy is available to decrease symptoms and risks. You should talk to your health care provider about whether hormone replacement therapy is right for you.  Use sunscreen. Apply sunscreen liberally and repeatedly throughout the day. You should seek shade when your shadow is shorter than you. Protect yourself by wearing long sleeves, pants, a wide-brimmed hat, and sunglasses year round, whenever you are outdoors.  Once a month, do a whole body skin exam, using a mirror to look at the skin on your back. Tell your health care provider of new moles, moles that have irregular borders, moles that  are larger than a pencil eraser, or moles that have changed in shape or color.  Stay current with required vaccines (immunizations).  Influenza vaccine. All adults should be immunized every year.  Tetanus, diphtheria, and acellular pertussis (Td, Tdap) vaccine. Pregnant women should receive 1 dose of Tdap vaccine during each pregnancy. The dose should be obtained regardless of the length of time since the last dose. Immunization is preferred during the 27th-36th week of gestation. An adult who has not previously received Tdap or who does not know her vaccine status should receive 1 dose of Tdap. This initial dose should be followed by tetanus and diphtheria toxoids (Td) booster doses every 10 years. Adults with an unknown or incomplete history of completing a 3-dose immunization series with Td-containing vaccines should begin or complete a primary immunization series including a Tdap dose. Adults should receive a Td booster every 10 years.  Varicella vaccine. An adult without evidence of immunity to varicella should receive 2 doses or a second dose if she has previously received 1 dose. Pregnant females who do not have evidence of immunity should receive the first dose after pregnancy. This first dose should be obtained before leaving the health care facility. The second dose should be obtained 4-8 weeks after the first dose.  Human papillomavirus (HPV) vaccine. Females aged 13-26 years who have not received the vaccine previously should obtain the 3-dose series. The vaccine is not recommended for use in pregnant females. However, pregnancy testing is not needed before receiving a dose. If a female is found to be pregnant after receiving a dose, no treatment is needed. In that case, the remaining doses should be delayed until after the pregnancy. Immunization is recommended for any person with an immunocompromised condition through the age of 61 years if she did not get any or all doses earlier. During the  3-dose series, the second dose should be obtained 4-8 weeks after the first dose. The third dose should be obtained 24 weeks after the first dose and 16 weeks after the second dose.  Zoster vaccine. One dose is recommended for adults aged 30 years or older unless certain conditions are present.  Measles, mumps, and rubella (MMR) vaccine. Adults born  before 1957 generally are considered immune to measles and mumps. Adults born in 1957 or later should have 1 or more doses of MMR vaccine unless there is a contraindication to the vaccine or there is laboratory evidence of immunity to each of the three diseases. A routine second dose of MMR vaccine should be obtained at least 28 days after the first dose for students attending postsecondary schools, health care workers, or international travelers. People who received inactivated measles vaccine or an unknown type of measles vaccine during 1963-1967 should receive 2 doses of MMR vaccine. People who received inactivated mumps vaccine or an unknown type of mumps vaccine before 1979 and are at high risk for mumps infection should consider immunization with 2 doses of MMR vaccine. For females of childbearing age, rubella immunity should be determined. If there is no evidence of immunity, females who are not pregnant should be vaccinated. If there is no evidence of immunity, females who are pregnant should delay immunization until after pregnancy. Unvaccinated health care workers born before 1957 who lack laboratory evidence of measles, mumps, or rubella immunity or laboratory confirmation of disease should consider measles and mumps immunization with 2 doses of MMR vaccine or rubella immunization with 1 dose of MMR vaccine.  Pneumococcal 13-valent conjugate (PCV13) vaccine. When indicated, a person who is uncertain of his immunization history and has no record of immunization should receive the PCV13 vaccine. All adults 65 years of age and older should receive this  vaccine. An adult aged 19 years or older who has certain medical conditions and has not been previously immunized should receive 1 dose of PCV13 vaccine. This PCV13 should be followed with a dose of pneumococcal polysaccharide (PPSV23) vaccine. Adults who are at high risk for pneumococcal disease should obtain the PPSV23 vaccine at least 8 weeks after the dose of PCV13 vaccine. Adults older than 59 years of age who have normal immune system function should obtain the PPSV23 vaccine dose at least 1 year after the dose of PCV13 vaccine.  Pneumococcal polysaccharide (PPSV23) vaccine. When PCV13 is also indicated, PCV13 should be obtained first. All adults aged 65 years and older should be immunized. An adult younger than age 65 years who has certain medical conditions should be immunized. Any person who resides in a nursing home or long-term care facility should be immunized. An adult smoker should be immunized. People with an immunocompromised condition and certain other conditions should receive both PCV13 and PPSV23 vaccines. People with human immunodeficiency virus (HIV) infection should be immunized as soon as possible after diagnosis. Immunization during chemotherapy or radiation therapy should be avoided. Routine use of PPSV23 vaccine is not recommended for American Indians, [place] Natives, or people younger than 65 years unless there are medical conditions that require PPSV23 vaccine. When indicated, people who have unknown immunization and have no record of immunization should receive PPSV23 vaccine. One-time revaccination 5 years after the first dose of PPSV23 is recommended for people aged 19-64 years who have chronic kidney failure, nephrotic syndrome, asplenia, or immunocompromised conditions. People who received 1-2 doses of PPSV23 before age 65 years should receive another dose of PPSV23 vaccine at age 65 years or later if at least 5 years have passed since the previous dose. Doses of PPSV23 are not  needed for people immunized with PPSV23 at or after age 65 years.  Meningococcal vaccine. Adults with asplenia or persistent complement component deficiencies should receive 2 doses of quadrivalent meningococcal conjugate (MenACWY-D) vaccine. The doses should be obtained   at least 2 months apart. Microbiologists working with certain meningococcal bacteria, [place] recruits, people at risk during an outbreak, and people who travel to or live in countries with a high rate of meningitis should be immunized. A first-year college student up through age 34 years who is living in a residence hall should receive a dose if she did not receive a dose on or after her 16th birthday. Adults who have certain high-risk conditions should receive one or more doses of vaccine.  Hepatitis A vaccine. Adults who wish to be protected from this disease, have certain high-risk conditions, work with hepatitis A-infected animals, work in hepatitis A research labs, or travel to or work in countries with a high rate of hepatitis A should be immunized. Adults who were previously unvaccinated and who anticipate close contact with an international adoptee during the first 60 days after arrival in the [place] from a country with a high rate of hepatitis A should be immunized.  Hepatitis B vaccine. Adults who wish to be protected from this disease, have certain high-risk conditions, may be exposed to blood or other infectious body fluids, are household contacts or sex partners of hepatitis B positive people, are clients or workers in certain care facilities, or travel to or work in countries with a high rate of hepatitis B should be immunized.  Haemophilus influenzae type b (Hib) vaccine. A previously unvaccinated person with asplenia or sickle cell disease or having a scheduled splenectomy should receive 1 dose of Hib vaccine. Regardless of previous immunization, a recipient of a hematopoietic stem cell transplant should receive a  3-dose series 6-12 months after her successful transplant. Hib vaccine is not recommended for adults with HIV infection. Preventive Services / Frequency Ages 35 to 4 years  Blood pressure check.** / Every 3-5 years.  Lipid and cholesterol check.** / Every 5 years beginning at age 60.  Clinical breast exam.** / Every 3 years for women in their 71s and 10s.  BRCA-related cancer risk assessment.** / For women who have family members with a BRCA-related cancer (breast, ovarian, tubal, or peritoneal cancers).  Pap test.** / Every 2 years from ages 76 through 26. Every 3 years starting at age 61 through age 76 or 93 with a history of 3 consecutive normal Pap tests.  HPV screening.** / Every 3 years from ages 37 through ages 60 to 51 with a history of 3 consecutive normal Pap tests.  Hepatitis C blood test.** / For any individual with known risks for hepatitis C.  Skin self-exam. / Monthly.  Influenza vaccine. / Every year.  Tetanus, diphtheria, and acellular pertussis (Tdap, Td) vaccine.** / Consult your health care provider. Pregnant women should receive 1 dose of Tdap vaccine during each pregnancy. 1 dose of Td every 10 years.  Varicella vaccine.** / Consult your health care provider. Pregnant females who do not have evidence of immunity should receive the first dose after pregnancy.  HPV vaccine. / 3 doses over 6 months, if 93 and younger. The vaccine is not recommended for use in pregnant females. However, pregnancy testing is not needed before receiving a dose.  Measles, mumps, rubella (MMR) vaccine.** / You need at least 1 dose of MMR if you were born in 1957 or later. You may also need a 2nd dose. For females of childbearing age, rubella immunity should be determined. If there is no evidence of immunity, females who are not pregnant should be vaccinated. If there is no evidence of immunity, females who are  pregnant should delay immunization until after pregnancy.  Pneumococcal  13-valent conjugate (PCV13) vaccine.** / Consult your health care provider.  Pneumococcal polysaccharide (PPSV23) vaccine.** / 1 to 2 doses if you smoke cigarettes or if you have certain conditions.  Meningococcal vaccine.** / 1 dose if you are age 68 to 8 years and a Market researcher living in a residence hall, or have one of several medical conditions, you need to get vaccinated against meningococcal disease. You may also need additional booster doses.  Hepatitis A vaccine.** / Consult your health care provider.  Hepatitis B vaccine.** / Consult your health care provider.  Haemophilus influenzae type b (Hib) vaccine.** / Consult your health care provider. Ages 7 to 53 years  Blood pressure check.** / Every year.  Lipid and cholesterol check.** / Every 5 years beginning at age 25 years.  Lung cancer screening. / Every year if you are aged 11-80 years and have a 30-pack-year history of smoking and currently smoke or have quit within the past 15 years. Yearly screening is stopped once you have quit smoking for at least 15 years or develop a health problem that would prevent you from having lung cancer treatment.  Clinical breast exam.** / Every year after age 48 years.  BRCA-related cancer risk assessment.** / For women who have family members with a BRCA-related cancer (breast, ovarian, tubal, or peritoneal cancers).  Mammogram.** / Every year beginning at age 41 years and continuing for as long as you are in good health. Consult with your health care provider.  Pap test.** / Every 3 years starting at age 65 years through age 37 or 70 years with a history of 3 consecutive normal Pap tests.  HPV screening.** / Every 3 years from ages 72 years through ages 60 to 40 years with a history of 3 consecutive normal Pap tests.  Fecal occult blood test (FOBT) of stool. / Every year beginning at age 21 years and continuing until age 5 years. You may not need to do this test if you get  a colonoscopy every 10 years.  Flexible sigmoidoscopy or colonoscopy.** / Every 5 years for a flexible sigmoidoscopy or every 10 years for a colonoscopy beginning at age 35 years and continuing until age 48 years.  Hepatitis C blood test.** / For all people born from [birth year] through 1965 and any individual with known risks for hepatitis C.  Skin self-exam. / Monthly.  Influenza vaccine. / Every year.  Tetanus, diphtheria, and acellular pertussis (Tdap/Td) vaccine.** / Consult your health care provider. Pregnant women should receive 1 dose of Tdap vaccine during each pregnancy. 1 dose of Td every 10 years.  Varicella vaccine.** / Consult your health care provider. Pregnant females who do not have evidence of immunity should receive the first dose after pregnancy.  Zoster vaccine.** / 1 dose for adults aged 30 years or older.  Measles, mumps, rubella (MMR) vaccine.** / You need at least 1 dose of MMR if you were born in 1957 or later. You may also need a second dose. For females of childbearing age, rubella immunity should be determined. If there is no evidence of immunity, females who are not pregnant should be vaccinated. If there is no evidence of immunity, females who are pregnant should delay immunization until after pregnancy.  Pneumococcal 13-valent conjugate (PCV13) vaccine.** / Consult your health care provider.  Pneumococcal polysaccharide (PPSV23) vaccine.** / 1 to 2 doses if you smoke cigarettes or if you have certain conditions.  Meningococcal vaccine.** /  Consult your health care provider.  Hepatitis A vaccine.** / Consult your health care provider.  Hepatitis B vaccine.** / Consult your health care provider.  Haemophilus influenzae type b (Hib) vaccine.** / Consult your health care provider. Ages 64 years and over  Blood pressure check.** / Every year.  Lipid and cholesterol check.** / Every 5 years beginning at age 23 years.  Lung cancer screening. / Every year if you  are aged 16-80 years and have a 30-pack-year history of smoking and currently smoke or have quit within the past 15 years. Yearly screening is stopped once you have quit smoking for at least 15 years or develop a health problem that would prevent you from having lung cancer treatment.  Clinical breast exam.** / Every year after age 74 years.  BRCA-related cancer risk assessment.** / For women who have family members with a BRCA-related cancer (breast, ovarian, tubal, or peritoneal cancers).  Mammogram.** / Every year beginning at age 44 years and continuing for as long as you are in good health. Consult with your health care provider.  Pap test.** / Every 3 years starting at age 58 years through age 22 or 39 years with 3 consecutive normal Pap tests. Testing can be stopped between 65 and 70 years with 3 consecutive normal Pap tests and no abnormal Pap or HPV tests in the past 10 years.  HPV screening.** / Every 3 years from ages 64 years through ages 70 or 61 years with a history of 3 consecutive normal Pap tests. Testing can be stopped between 65 and 70 years with 3 consecutive normal Pap tests and no abnormal Pap or HPV tests in the past 10 years.  Fecal occult blood test (FOBT) of stool. / Every year beginning at age 40 years and continuing until age 27 years. You may not need to do this test if you get a colonoscopy every 10 years.  Flexible sigmoidoscopy or colonoscopy.** / Every 5 years for a flexible sigmoidoscopy or every 10 years for a colonoscopy beginning at age 7 years and continuing until age 32 years.  Hepatitis C blood test.** / For all people born from [birth year] through 1965 and any individual with known risks for hepatitis C.  Osteoporosis screening.** / A one-time screening for women ages 30 years and over and women at risk for fractures or osteoporosis.  Skin self-exam. / Monthly.  Influenza vaccine. / Every year.  Tetanus, diphtheria, and acellular pertussis (Tdap/Td)  vaccine.** / 1 dose of Td every 10 years.  Varicella vaccine.** / Consult your health care provider.  Zoster vaccine.** / 1 dose for adults aged 35 years or older.  Pneumococcal 13-valent conjugate (PCV13) vaccine.** / Consult your health care provider.  Pneumococcal polysaccharide (PPSV23) vaccine.** / 1 dose for all adults aged 46 years and older.  Meningococcal vaccine.** / Consult your health care provider.  Hepatitis A vaccine.** / Consult your health care provider.  Hepatitis B vaccine.** / Consult your health care provider.  Haemophilus influenzae type b (Hib) vaccine.** / Consult your health care provider. ** Family history and personal history of risk and conditions may change your health care provider's recommendations.   This information is not intended to replace advice given to you by your health care provider. Make sure you discuss any questions you have with your health care provider.   Document Released: 06/02/2001 Document Revised: 04/27/2014 Document Reviewed: 09/01/2010 Elsevier Interactive Patient Education [employer].

## 2015-02-27 NOTE — Assessment & Plan Note (Signed)
Here for medicare wellness/physical  Diet: heart healthy  Physical activity: sedentary  Depression/mood screen: chronic depression Hearing: intact to whispered voice  Visual acuity: grossly normal, performs annual eye exam  ADLs: capable  Fall risk: low  Home safety: good  Cognitive evaluation: intact to orientation, naming, recall and repetition  EOL planning: adv directives, full code/ I agree  I have personally reviewed and have noted  1. The patient's medical, surgical and social history  2. Their use of alcohol, tobacco or illicit drugs  3. Their current medications and supplements  4. The patient's functional ability including ADL's, fall risks, home safety risks and hearing or visual impairment.  5. Diet and physical activities  6. Evidence for depression or mood disorders 7. The roster of all physicians providing medical care to patient - is listed in the Snapshot section of the chart and reviewed today.    Today patient counseled on age appropriate routine health concerns for screening and prevention, each reviewed and up to date or declined. Immunizations reviewed and up to date or declined. Labs ordered and reviewed. Risk factors for depression reviewed and negative. Hearing function and visual acuity are intact. ADLs screened and addressed as needed. Functional ability and level of safety reviewed and appropriate. Education, counseling and referrals performed based on assessed risks today. Patient provided with a copy of personalized plan for preventive services.

## 2015-03-11 ENCOUNTER — Encounter: Payer: Medicare Other | Attending: Surgery

## 2015-03-11 DIAGNOSIS — Z6841 Body Mass Index (BMI) 40.0 and over, adult: Secondary | ICD-10-CM | POA: Insufficient documentation

## 2015-03-11 DIAGNOSIS — Z713 Dietary counseling and surveillance: Secondary | ICD-10-CM | POA: Insufficient documentation

## 2015-03-11 NOTE — Progress Notes (Signed)
  Pre-Operative Nutrition Class:  Appt start time: 830   End time:  930.  Patient was seen on 03/11/2015 for Pre-Operative Bariatric Surgery Education at [facility].   Surgery date: 04/05/15 Surgery type: sleeve Start weight at [facility]: 265 lbs on 11/13/2013 Weight today: 267 lbs  TANITA  BODY COMP RESULTS  03/11/15   BMI (kg/m^2) NA   Fat Mass (lbs)    Fat Free Mass (lbs)    Total Body Water (lbs)    Samples given per MNT protocol. Patient educated on appropriate usage: PB2  Lot #[id number] Exp: 02/08/2017  [facility] Bariatric Advantage Calcium Citrate Lot #[id number] Exp:11/2016  Chocolate Celebrate Calcium Citrate Chews 500 mg Lot #[id number] Exp: 06/2016  Darrold Junker Protein Powder Lot #[id number] Exp: 12/2015  Chocolate Premier Protein  Lot #[id number] Exp: 01/23/2016  The following the learning objectives were met by the patient during this course:  Identify Pre-Op Dietary Goals and will begin 2 weeks pre-operatively  Identify appropriate sources of fluids and proteins   State protein recommendations and appropriate sources pre and post-operatively  Identify Post-Operative Dietary Goals and will follow for 2 weeks post-operatively  Identify appropriate multivitamin and calcium sources  Describe the need for physical activity post-operatively and will follow MD recommendations  State when to call healthcare provider regarding medication questions or post-operative complications  Handouts given during class include:  Pre-Op Bariatric Surgery Diet Handout  Protein Shake Handout  Post-Op Bariatric Surgery Nutrition Handout  BELT Program Information Flyer  Support Group Information Flyer  [facility] Bariatric Supplements Price List  Follow-Up Plan: Patient will follow-up at [facility] 2 weeks post operatively for diet advancement per MD.

## 2015-03-21 ENCOUNTER — Ambulatory Visit: Payer: Self-pay | Admitting: Surgery

## 2015-03-21 DIAGNOSIS — K439 Ventral hernia without obstruction or gangrene: Secondary | ICD-10-CM

## 2015-03-21 DIAGNOSIS — Z9049 Acquired absence of other specified parts of digestive tract: Secondary | ICD-10-CM | POA: Insufficient documentation

## 2015-03-21 NOTE — H&P (Signed)
Chief Complaint:  Morbid obesity and ventral herniae  History of Present Illness:  Emily Sanchez is an 59 y.o. female followed by Dr. 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  She has been evaluated for sleeve gastrectomy and is ready to move forward with surgery.  She is aware of the difficulty of operating upon here with all of her abdominal surgery.    Past Medical History  Diagnosis Date  . Depression   . HTN (hypertension)   . Perianal pain     Chronic, Post-Op  . 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 Ulcerative colitis ([place])     Dr Sharlett Iles  . Anxiety   . Low back pain     FMS  . GERD (gastroesophageal reflux disease)   . Osteoarthritis     Dr Alvan Dame; both knees  . Enteritis due to Clostridium difficile 08/16/2012  . Crohn's disease ([place])   . Chronic kidney disease     stage 3     Past Surgical History  Procedure Laterality Date  . Ileostomy    . Colectomy  2001    ulcerative colitis  . Breath tek h pylori N/A 12/05/2013    Procedure: BREATH TEK H PYLORI;  Surgeon: Pedro Earls, MD;  Location: [facility];  Service: General;  Laterality: N/A;    Current Outpatient Prescriptions  Medication Sig Dispense Refill  . bifidobacterium infantis (ALIGN) capsule Take 1 capsule by mouth daily.      . calcium carbonate (OS-CAL) 600 MG TABS tablet Take 600 mg by mouth daily with breakfast.    . Cholecalciferol 1000 UNITS tablet Take 3,000 Units by mouth daily.      . cyanocobalamin (,VITAMIN B-12,) 1000 MCG/ML injection Inject 1 mL (1,000 mcg total) into the muscle every 30 (thirty) days. 10 mL 11  . cyclobenzaprine (FLEXERIL) 5 MG tablet TAKE 1-2 TABLETS BY MOUTH AT BEDTIME AND MAY REPEAT DOSE ONE TIME IF NEEDED 60 tablet 2  . hydrochlorothiazide (MICROZIDE) 12.5 MG capsule Take 1 capsule (12.5 mg total) by mouth daily. 90 capsule 3  . loratadine (CLARITIN) 10 MG tablet Take 1 tablet (10 mg total) by  mouth daily. 100 tablet 3  . Multiple Vitamin (MULTIVITAMIN WITH MINERALS) TABS tablet Take 1 tablet by mouth daily.    Marland Kitchen oxyCODONE (ROXICODONE) 15 MG immediate release tablet Take 1 tablet (15 mg total) by mouth 4 (four) times daily. Please fill on or after  04/29/15 120 tablet 0  . phentermine (ADIPEX-P) 37.5 MG tablet TAKE 1 TABLET BY MOUTH DAILY BEFORE BREAKFAST 30 tablet 2  . SYRINGE-NEEDLE, DISP, 3 ML (B-D INTEGRA SYRINGE) 23G X 1" 3 ML MISC 1 Device by Does not apply route every 30 (thirty) days. For B12 shots 50 each 3   No current facility-administered medications for this visit.   Amphetamine-dextroamphet er; Cymbalta; Erythromycin ethylsuccinate; and Morphine Family History  Problem Relation Age of Onset  . Hypertension    . Heart disease Father 72    CHF  . Depression Sister    Social History:   reports that she has quit smoking. She has never used smokeless tobacco. She reports that she does not drink alcohol or use illicit drugs.   REVIEW OF SYSTEMS : Negative except for see problem list  Physical Exam:   There were no vitals taken for this visit. There is no weight on file to calculate BMI.  Gen:  WDWN AAF NAD  Neurological: Alert  and oriented to person, place, and time. Motor and sensory function is grossly intact  Head: Normocephalic and atraumatic.  Eyes: Conjunctivae are normal. Pupils are equal, round, and reactive to light. No scleral icterus.  Neck: Normal range of motion. Neck supple. No tracheal deviation or thyromegaly present.  Cardiovascular:  SR without murmurs or gallops.  No carotid bruits Breast:  Not examined Respiratory: Effort normal.  No respiratory distress. No chest wall tenderness. Breath sounds normal.  No wheezes, rales or rhonchi.  Abdomen:  Large ventral hernia in the upper midline that is soft and by CT contains small bowel and distal stomach GU: right lower quadrant ileostomy with hernia.   Musculoskeletal: Normal range of motion.  Extremities are nontender. No cyanosis, edema or clubbing noted Lymphadenopathy: No cervical, preauricular, postauricular or axillary adenopathy is present Skin: Skin is warm and dry. No rash noted. No diaphoresis. No erythema. No pallor. Pscyh: Normal mood and affect. Behavior is normal. Judgment and thought content normal.   LABORATORY RESULTS: No results found for this or any previous visit (from the past 48 hour(s)).   RADIOLOGY RESULTS: No results found.  Problem List: Patient Active Problem List   Diagnosis Date Noted  . Status post total colectomy and ileostomy for Crohn's 03/21/2015  . Ventral hernia 03/21/2015  . Well adult exam 02/27/2015  . Allergic rhinitis 08/16/2014  . Elevated serum creatinine 10/17/2012  . ARF (acute renal failure) ([place]) 08/16/2012  . Dehydration 08/16/2012  . Enteritis due to Clostridium difficile 08/16/2012  . Nausea and vomiting 08/16/2012  . Angular stomatitis 03/30/2012  . Anemia, chronic disease 04/08/2011  . CRI (chronic renal insufficiency) 04/08/2011  . GRIEF REACTION 10/23/2009  . Osteoarthritis 06/18/2009  . KNEE PAIN 06/18/2009  . RUQ PAIN 02/14/2009  . PNEUMONIA 11/06/2008  . COUGH 11/06/2008  . FIBROMYALGIA 08/13/2008  . OBESITY, MORBID 01/20/2008  . INSOMNIA, PERSISTENT 10/18/2007  . FATIGUE 10/18/2007  . LEG PAIN 09/16/2007  . VITAMIN D DEFICIENCY 07/15/2007  . WEIGHT GAIN 07/15/2007  . Acute upper respiratory infection 04/26/2007  . Anxiety state 02/17/2007  . GERD 02/17/2007  . LOW BACK PAIN 02/17/2007  . B12 deficiency 11/08/2006  . PANIC ATTACK 11/08/2006  . DEPRESSION 11/08/2006  . Essential hypertension 11/08/2006  . Crohn's disease ([place]) 11/08/2006    Assessment & Plan: Complicated abdominal wall for sleeve gastrectomy.  I have explained the procedure to her in some detail.      Matt B. Hassell Done, MD, [facility] [phone] beeper [phone]  03/21/2015 4:04  PM

## 2015-03-25 DIAGNOSIS — M170 Bilateral primary osteoarthritis of knee: Secondary | ICD-10-CM | POA: Diagnosis not present

## 2015-03-28 ENCOUNTER — Encounter (INDEPENDENT_AMBULATORY_CARE_PROVIDER_SITE_OTHER): Payer: Self-pay

## 2015-03-28 ENCOUNTER — Encounter (HOSPITAL_COMMUNITY)
Admission: RE | Admit: 2015-03-28 | Discharge: 2015-03-28 | Disposition: A | Discharge status: Home / Self Care | Payer: Medicare Other | Source: Ambulatory Visit | Attending: Surgery | Admitting: Surgery

## 2015-03-28 ENCOUNTER — Encounter (HOSPITAL_COMMUNITY): Payer: Self-pay

## 2015-03-28 DIAGNOSIS — Z01818 Encounter for other preprocedural examination: Secondary | ICD-10-CM | POA: Insufficient documentation

## 2015-03-28 HISTORY — DX: Ileostomy status: Z93.2

## 2015-03-28 HISTORY — DX: Personal history of other medical treatment: Z92.89

## 2015-03-28 LAB — COMPREHENSIVE METABOLIC PANEL
ALK PHOS: 68 U/L (ref 38–126)
ALT: 17 U/L (ref 14–54)
AST: 18 U/L (ref 15–41)
Albumin: 4 g/dL (ref 3.5–5.0)
Anion gap: 11 (ref 5–15)
BILIRUBIN TOTAL: 0.4 mg/dL (ref 0.3–1.2)
BUN: 25 mg/dL — ABNORMAL HIGH (ref 6–20)
CALCIUM: 9.8 mg/dL (ref 8.9–10.3)
CO2: 29 mmol/L (ref 22–32)
CREATININE: 1.12 mg/dL — AB (ref 0.44–1.00)
Chloride: 104 mmol/L (ref 101–111)
GFR, EST NON AFRICAN AMERICAN: 53 mL/min — AB (ref >60)
Glucose, Bld: 104 mg/dL — ABNORMAL HIGH (ref 65–99)
Potassium: 4.7 mmol/L (ref 3.5–5.1)
SODIUM: 144 mmol/L (ref 135–145)
TOTAL PROTEIN: 7.6 g/dL (ref 6.5–8.1)

## 2015-03-28 LAB — CBC WITH DIFFERENTIAL/PLATELET
Basophils Absolute: 0 10*3/uL (ref 0.0–0.1)
Basophils Relative: 0 %
Eosinophils Absolute: 0.1 10*3/uL (ref 0.0–0.7)
Eosinophils Relative: 2 %
HEMATOCRIT: 39.3 % (ref 36.0–46.0)
HEMOGLOBIN: 12.6 g/dL (ref 12.0–15.0)
LYMPHS ABS: 3.2 10*3/uL (ref 0.7–4.0)
LYMPHS PCT: 37 %
MCH: 28.2 pg (ref 26.0–34.0)
MCHC: 32.1 g/dL (ref 30.0–36.0)
MCV: 87.9 fL (ref 78.0–100.0)
MONOS PCT: 8 %
Monocytes Absolute: 0.7 10*3/uL (ref 0.1–1.0)
NEUTROS ABS: 4.7 10*3/uL (ref 1.7–7.7)
NEUTROS PCT: 53 %
Platelets: 273 10*3/uL (ref 150–400)
RBC: 4.47 MIL/uL (ref 3.87–5.11)
RDW: 15.3 % (ref 11.5–15.5)
WBC: 8.7 10*3/uL (ref 4.0–10.5)

## 2015-03-28 NOTE — Progress Notes (Signed)
03-28-15 1630 Labs viewable in [place], note BUN(known Stage III kidney disease-stable).

## 2015-03-28 NOTE — Pre-Procedure Instructions (Addendum)
EKG done today. 03-28-15 1645 CMP routed to Dr. Hassell Done in basket to note BUN.

## 2015-03-28 NOTE — Patient Instructions (Addendum)
[place]  03/28/2015   Your procedure is scheduled on:   12-13--2016 Tuesday.  Enter through [facility]  Entrance and follow signs to [facility] to [place]. Arrive at   1130     AM.  (Limit 1 person with you).  Call this number if you have problems the morning of surgery: [phone]  Or Presurgical Testing [phone] days before.   For Living Will and/or Health Care Power Attorney Forms: please provide copy for your medical record,may bring AM of surgery(Forms should be already notarized -we do not provide this service).( No information preferred today).  Remember: Follow any bowel prep instructions per MD office.    Do not eat food/ or drink: After Midnight.  Exception: may have clear liquids:up to 6 Hours before arrival. Nothing after: 0700 AM  Clear liquids include soda, tea, black coffee, apple or grape juice, broth.  Take these medicines the morning of surgery with A SIP OF WATER-   (DO NOT TAKE ANY DIABETIC MEDS AM OF SURGERY) : Pantoprazole. Oxycodone.   Do not wear jewelry, make-up or nail polish.  Do not wear deodorant, lotions, powders, or perfumes.   Do not shave legs and under arms- 48 hours(2 days) prior to first CHG shower.(Shaving face and neck okay.)  Do not bring valuables to the hospital.(Hospital is not responsible for lost valuables).  Contacts, dentures or removable bridgework, body piercing, hair pins may not be worn into surgery.  Leave suitcase in the car. After surgery it may be brought to your room.  For patients admitted to the hospital, checkout time is 11:00 AM the day of discharge.(Restricted visitors-Any Persons displaying flu-like symptoms or illness).    Patients discharged the day of surgery will not be allowed to drive home. Must have responsible person with you x 24 hours once discharged.  Name and phone number of your driver: Coley Littles, spouse [phone] cell     Please read over the following fact  sheets that you were given:  CHG(Chlorhexidine Gluconate 4% Surgical Soap) use.           [place] - Preparing for Surgery Before surgery, you can play an important role.  Because skin is not sterile, your skin needs to be as free of germs as possible.  You can reduce the number of germs on your skin by washing with CHG (chlorahexidine gluconate) soap before surgery.  CHG is an antiseptic cleaner which kills germs and bonds with the skin to continue killing germs even after washing. Please DO NOT use if you have an allergy to CHG or antibacterial soaps.  If your skin becomes reddened/irritated stop using the CHG and inform your nurse when you arrive at Short Stay. Do not shave (including legs and underarms) for at least 48 hours prior to the first CHG shower.  You may shave your face/neck. Please follow these instructions carefully:  1.  Shower with CHG Soap the night before surgery and the  morning of Surgery.  2.  If you choose to wash your hair, wash your hair first as usual with your  normal  shampoo.  3.  After you shampoo, rinse your hair and body thoroughly to remove the  shampoo.                           4.  Use CHG as you would any other liquid soap.  You can apply chg directly  to the skin and wash                       Gently with a scrungie or clean washcloth.  5.  Apply the CHG Soap to your body ONLY FROM THE NECK DOWN.   Do not use on face/ open                           Wound or open sores. Avoid contact with eyes, ears mouth and genitals (private parts).                       Wash face,  Genitals (private parts) with your normal soap.             6.  Wash thoroughly, paying special attention to the area where your surgery  will be performed.  7.  Thoroughly rinse your body with warm water from the neck down.  8.  DO NOT shower/wash with your normal soap after using and rinsing off  the CHG Soap.                9.  Pat yourself dry with a clean towel.            10.  Wear  clean pajamas.            11.  Place clean sheets on your bed the night of your first shower and do not  sleep with pets. Day of Surgery : Do not apply any lotions/deodorants the morning of surgery.  Please wear clean clothes to the hospital/surgery center.  FAILURE TO FOLLOW THESE INSTRUCTIONS MAY RESULT IN THE CANCELLATION OF YOUR SURGERY PATIENT SIGNATURE_________________________________  NURSE SIGNATURE__________________________________  ________________________________________________________________________

## 2015-04-02 ENCOUNTER — Inpatient Hospital Stay (HOSPITAL_COMMUNITY): Payer: Medicare Other | Admitting: Anesthesiology

## 2015-04-02 ENCOUNTER — Encounter (HOSPITAL_COMMUNITY): Payer: Self-pay | Admitting: *Deleted

## 2015-04-02 ENCOUNTER — Encounter (HOSPITAL_COMMUNITY)
Admission: RE | Disposition: A | Discharge status: Home / Self Care | Payer: Self-pay | Source: Ambulatory Visit | Attending: Surgery

## 2015-04-02 ENCOUNTER — Inpatient Hospital Stay (HOSPITAL_COMMUNITY)
Admission: RE | Admit: 2015-04-02 | Discharge: 2015-04-08 | DRG: 620 | Disposition: A | Discharge status: Home / Self Care | Payer: Medicare Other | Source: Ambulatory Visit | Attending: Surgery | Admitting: Surgery

## 2015-04-02 DIAGNOSIS — K436 Other and unspecified ventral hernia with obstruction, without gangrene: Secondary | ICD-10-CM | POA: Diagnosis present

## 2015-04-02 DIAGNOSIS — N183 Chronic kidney disease, stage 3 (moderate): Secondary | ICD-10-CM | POA: Diagnosis present

## 2015-04-02 DIAGNOSIS — G8929 Other chronic pain: Secondary | ICD-10-CM | POA: Diagnosis present

## 2015-04-02 DIAGNOSIS — Z9884 Bariatric surgery status: Secondary | ICD-10-CM

## 2015-04-02 DIAGNOSIS — Z8249 Family history of ischemic heart disease and other diseases of the circulatory system: Secondary | ICD-10-CM | POA: Diagnosis not present

## 2015-04-02 DIAGNOSIS — D62 Acute posthemorrhagic anemia: Secondary | ICD-10-CM | POA: Diagnosis not present

## 2015-04-02 DIAGNOSIS — K219 Gastro-esophageal reflux disease without esophagitis: Secondary | ICD-10-CM | POA: Diagnosis present

## 2015-04-02 DIAGNOSIS — Z79899 Other long term (current) drug therapy: Secondary | ICD-10-CM

## 2015-04-02 DIAGNOSIS — I129 Hypertensive chronic kidney disease with stage 1 through stage 4 chronic kidney disease, or unspecified chronic kidney disease: Secondary | ICD-10-CM | POA: Diagnosis present

## 2015-04-02 DIAGNOSIS — K5090 Crohn's disease, unspecified, without complications: Secondary | ICD-10-CM | POA: Diagnosis present

## 2015-04-02 DIAGNOSIS — M545 Low back pain: Secondary | ICD-10-CM | POA: Diagnosis present

## 2015-04-02 DIAGNOSIS — Z9049 Acquired absence of other specified parts of digestive tract: Secondary | ICD-10-CM

## 2015-04-02 DIAGNOSIS — K660 Peritoneal adhesions (postprocedural) (postinfection): Secondary | ICD-10-CM | POA: Diagnosis present

## 2015-04-02 DIAGNOSIS — Z01812 Encounter for preprocedural laboratory examination: Secondary | ICD-10-CM

## 2015-04-02 DIAGNOSIS — Z01810 Encounter for preprocedural cardiovascular examination: Secondary | ICD-10-CM | POA: Diagnosis not present

## 2015-04-02 DIAGNOSIS — M170 Bilateral primary osteoarthritis of knee: Secondary | ICD-10-CM | POA: Diagnosis present

## 2015-04-02 DIAGNOSIS — Z6841 Body Mass Index (BMI) 40.0 and over, adult: Secondary | ICD-10-CM | POA: Diagnosis not present

## 2015-04-02 DIAGNOSIS — F419 Anxiety disorder, unspecified: Secondary | ICD-10-CM | POA: Diagnosis present

## 2015-04-02 DIAGNOSIS — N189 Chronic kidney disease, unspecified: Secondary | ICD-10-CM | POA: Diagnosis not present

## 2015-04-02 DIAGNOSIS — F329 Major depressive disorder, single episode, unspecified: Secondary | ICD-10-CM | POA: Diagnosis present

## 2015-04-02 DIAGNOSIS — Z87891 Personal history of nicotine dependence: Secondary | ICD-10-CM

## 2015-04-02 DIAGNOSIS — K2950 Unspecified chronic gastritis without bleeding: Secondary | ICD-10-CM | POA: Diagnosis not present

## 2015-04-02 DIAGNOSIS — I10 Essential (primary) hypertension: Secondary | ICD-10-CM | POA: Diagnosis not present

## 2015-04-02 DIAGNOSIS — M79609 Pain in unspecified limb: Secondary | ICD-10-CM | POA: Diagnosis not present

## 2015-04-02 DIAGNOSIS — Z818 Family history of other mental and behavioral disorders: Secondary | ICD-10-CM | POA: Diagnosis not present

## 2015-04-02 HISTORY — PX: LAPAROSCOPIC LYSIS OF ADHESIONS: SHX5905

## 2015-04-02 HISTORY — PX: VENTRAL HERNIA REPAIR: SHX424

## 2015-04-02 HISTORY — PX: LAPAROSCOPIC GASTRIC SLEEVE RESECTION WITH HIATAL HERNIA REPAIR: SHX6512

## 2015-04-02 HISTORY — PX: UPPER GI ENDOSCOPY: SHX6162

## 2015-04-02 LAB — CBC
HCT: 38.1 % (ref 36.0–46.0)
HEMOGLOBIN: 12.9 g/dL (ref 12.0–15.0)
MCH: 29 pg (ref 26.0–34.0)
MCHC: 33.9 g/dL (ref 30.0–36.0)
MCV: 85.6 fL (ref 78.0–100.0)
Platelets: ADEQUATE 10*3/uL (ref 150–400)
RBC: 4.45 MIL/uL (ref 3.87–5.11)
RDW: 14.7 % (ref 11.5–15.5)
WBC: 11.8 10*3/uL — ABNORMAL HIGH (ref 4.0–10.5)

## 2015-04-02 LAB — CREATININE, SERUM
CREATININE: 1.2 mg/dL — AB (ref 0.44–1.00)
GFR calc Af Amer: 56 mL/min — ABNORMAL LOW (ref >60)
GFR calc non Af Amer: 48 mL/min — ABNORMAL LOW (ref >60)

## 2015-04-02 LAB — HEMOGLOBIN AND HEMATOCRIT, BLOOD
HCT: 36 % (ref 36.0–46.0)
HEMOGLOBIN: 11.5 g/dL — AB (ref 12.0–15.0)

## 2015-04-02 LAB — MRSA PCR SCREENING: MRSA by PCR: NEGATIVE

## 2015-04-02 SURGERY — GASTRECTOMY, SLEEVE, LAPAROSCOPIC, WITH HIATAL HERNIA REPAIR
Anesthesia: General

## 2015-04-02 MED ORDER — SUFENTANIL CITRATE 50 MCG/ML IV SOLN
INTRAVENOUS | Status: DC | PRN
  Administered 2015-04-02: 5 ug via INTRAVENOUS
  Administered 2015-04-02: 10 ug via INTRAVENOUS
  Administered 2015-04-02 (×3): 5 ug via INTRAVENOUS
  Administered 2015-04-02: 15 ug via INTRAVENOUS
  Administered 2015-04-02: 5 ug via INTRAVENOUS
  Administered 2015-04-02: 10 ug via INTRAVENOUS
  Administered 2015-04-02 (×2): 5 ug via INTRAVENOUS

## 2015-04-02 MED ORDER — SODIUM CHLORIDE 0.9 % IJ SOLN
INTRAMUSCULAR | Status: AC
  Filled 2015-04-02: qty 10

## 2015-04-02 MED ORDER — LABETALOL HCL 5 MG/ML IV SOLN
INTRAVENOUS | Status: DC | PRN
  Administered 2015-04-02 (×2): 2.5 mg via INTRAVENOUS
  Administered 2015-04-02: 5 mg via INTRAVENOUS

## 2015-04-02 MED ORDER — ACETAMINOPHEN 160 MG/5ML PO SOLN: 325.0000 mg | ORAL | Status: DC | PRN

## 2015-04-02 MED ORDER — SUFENTANIL CITRATE 50 MCG/ML IV SOLN
INTRAVENOUS | Status: AC
  Filled 2015-04-02: qty 1

## 2015-04-02 MED ORDER — DEXTROSE 5 % IV SOLN
INTRAVENOUS | Status: AC
  Filled 2015-04-02: qty 2

## 2015-04-02 MED ORDER — CHLORHEXIDINE GLUCONATE CLOTH 2 % EX PADS: 6.0000 | MEDICATED_PAD | Freq: Once | CUTANEOUS | Status: DC

## 2015-04-02 MED ORDER — METOPROLOL TARTRATE 1 MG/ML IV SOLN
INTRAVENOUS | Status: AC
  Filled 2015-04-02: qty 5

## 2015-04-02 MED ORDER — LACTATED RINGERS IV SOLN
INTRAVENOUS | Status: DC
  Administered 2015-04-02 (×2): via INTRAVENOUS
  Administered 2015-04-02: 1000 mL via INTRAVENOUS

## 2015-04-02 MED ORDER — DEXAMETHASONE SODIUM PHOSPHATE 10 MG/ML IJ SOLN
INTRAMUSCULAR | Status: AC
  Filled 2015-04-02: qty 1

## 2015-04-02 MED ORDER — ACETAMINOPHEN 10 MG/ML IV SOLN
INTRAVENOUS | Status: AC
  Filled 2015-04-02: qty 100

## 2015-04-02 MED ORDER — FENTANYL CITRATE (PF) 250 MCG/5ML IJ SOLN
INTRAMUSCULAR | Status: AC
  Filled 2015-04-02: qty 5

## 2015-04-02 MED ORDER — ROCURONIUM BROMIDE 100 MG/10ML IV SOLN
INTRAVENOUS | Status: DC | PRN
  Administered 2015-04-02: 50 mg via INTRAVENOUS
  Administered 2015-04-02: 20 mg via INTRAVENOUS
  Administered 2015-04-02 (×2): 10 mg via INTRAVENOUS
  Administered 2015-04-02: 20 mg via INTRAVENOUS

## 2015-04-02 MED ORDER — KCL IN DEXTROSE-NACL 20-5-0.45 MEQ/L-%-% IV SOLN
INTRAVENOUS | Status: DC
  Administered 2015-04-02 – 2015-04-05 (×7): via INTRAVENOUS
  Administered 2015-04-05: 100 mL/h via INTRAVENOUS
  Administered 2015-04-06 – 2015-04-08 (×4): via INTRAVENOUS
  Filled 2015-04-02 (×15): qty 1000

## 2015-04-02 MED ORDER — PROPOFOL 10 MG/ML IV BOLUS
INTRAVENOUS | Status: AC
  Filled 2015-04-02: qty 40

## 2015-04-02 MED ORDER — ONDANSETRON HCL 4 MG/2ML IJ SOLN
INTRAMUSCULAR | Status: AC
  Administered 2015-04-02: 4 mg via INTRAVENOUS
  Filled 2015-04-02: qty 2

## 2015-04-02 MED ORDER — ROCURONIUM BROMIDE 100 MG/10ML IV SOLN
INTRAVENOUS | Status: AC
  Filled 2015-04-02: qty 1

## 2015-04-02 MED ORDER — ACETAMINOPHEN 160 MG/5ML PO SOLN
650.0000 mg | ORAL | Status: DC | PRN
Start: 2015-04-03 — End: 2015-04-08

## 2015-04-02 MED ORDER — DEXTROSE 5 % IV SOLN
2.0000 g | INTRAVENOUS | Status: AC
  Administered 2015-04-02 (×2): 2 g via INTRAVENOUS

## 2015-04-02 MED ORDER — PANTOPRAZOLE SODIUM 40 MG IV SOLR
40.0000 mg | Freq: Every day | INTRAVENOUS | Status: DC
  Administered 2015-04-02 – 2015-04-07 (×6): 40 mg via INTRAVENOUS
  Filled 2015-04-02 (×7): qty 40

## 2015-04-02 MED ORDER — HYDROMORPHONE HCL 1 MG/ML IJ SOLN
INTRAMUSCULAR | Status: AC
  Filled 2015-04-02: qty 1

## 2015-04-02 MED ORDER — HEPARIN SODIUM (PORCINE) 5000 UNIT/ML IJ SOLN
5000.0000 [IU] | Freq: Three times a day (TID) | INTRAMUSCULAR | Status: DC
Start: 2015-04-03 — End: 2015-04-04
  Administered 2015-04-03 – 2015-04-04 (×4): 5000 [IU] via SUBCUTANEOUS
  Filled 2015-04-02 (×7): qty 1

## 2015-04-02 MED ORDER — LABETALOL HCL 5 MG/ML IV SOLN
INTRAVENOUS | Status: AC
  Filled 2015-04-02: qty 4

## 2015-04-02 MED ORDER — HYDROMORPHONE HCL 2 MG/ML IJ SOLN
INTRAMUSCULAR | Status: AC
  Filled 2015-04-02: qty 1

## 2015-04-02 MED ORDER — 0.9 % SODIUM CHLORIDE (POUR BTL) OPTIME
TOPICAL | Status: DC | PRN
  Administered 2015-04-02: 1000 mL

## 2015-04-02 MED ORDER — PROPOFOL 10 MG/ML IV BOLUS
INTRAVENOUS | Status: DC | PRN
  Administered 2015-04-02: 50 mg via INTRAVENOUS
  Administered 2015-04-02: 30 mg via INTRAVENOUS
  Administered 2015-04-02: 200 mg via INTRAVENOUS

## 2015-04-02 MED ORDER — BUPIVACAINE LIPOSOME 1.3 % IJ SUSP
20.0000 mL | Freq: Once | INTRAMUSCULAR | Status: DC
  Filled 2015-04-02: qty 20

## 2015-04-02 MED ORDER — HYDROMORPHONE HCL 1 MG/ML IJ SOLN
INTRAMUSCULAR | Status: DC | PRN
  Administered 2015-04-02 (×2): 1 mg via INTRAVENOUS

## 2015-04-02 MED ORDER — LACTATED RINGERS IR SOLN
Status: DC | PRN
  Administered 2015-04-02: 1000 mL

## 2015-04-02 MED ORDER — SUCCINYLCHOLINE CHLORIDE 20 MG/ML IJ SOLN
INTRAMUSCULAR | Status: DC | PRN
  Administered 2015-04-02: 100 mg via INTRAVENOUS

## 2015-04-02 MED ORDER — ONDANSETRON HCL 4 MG/2ML IJ SOLN
INTRAMUSCULAR | Status: AC
  Filled 2015-04-02: qty 2

## 2015-04-02 MED ORDER — HYDROMORPHONE HCL 1 MG/ML IJ SOLN
0.2500 mg | INTRAMUSCULAR | Status: DC | PRN
  Administered 2015-04-02 (×2): 0.25 mg via INTRAVENOUS
  Administered 2015-04-02: 0.5 mg via INTRAVENOUS
  Administered 2015-04-02: 0.25 mg via INTRAVENOUS

## 2015-04-02 MED ORDER — DEXAMETHASONE SODIUM PHOSPHATE 10 MG/ML IJ SOLN
INTRAMUSCULAR | Status: DC | PRN
  Administered 2015-04-02: 10 mg via INTRAVENOUS

## 2015-04-02 MED ORDER — FENTANYL CITRATE (PF) 100 MCG/2ML IJ SOLN
INTRAMUSCULAR | Status: DC | PRN
  Administered 2015-04-02: 50 ug via INTRAVENOUS
  Administered 2015-04-02 (×2): 100 ug via INTRAVENOUS

## 2015-04-02 MED ORDER — MIDAZOLAM HCL 5 MG/5ML IJ SOLN
INTRAMUSCULAR | Status: DC | PRN
  Administered 2015-04-02: 2 mg via INTRAVENOUS
  Administered 2015-04-02: 0.5 mg via INTRAVENOUS
  Administered 2015-04-02: 1 mg via INTRAVENOUS

## 2015-04-02 MED ORDER — ONDANSETRON HCL 4 MG/2ML IJ SOLN
INTRAMUSCULAR | Status: DC | PRN
Start: 2015-04-02 — End: 2015-04-02
  Administered 2015-04-02: 4 mg via INTRAVENOUS

## 2015-04-02 MED ORDER — MIDAZOLAM HCL 2 MG/2ML IJ SOLN
INTRAMUSCULAR | Status: AC
  Filled 2015-04-02: qty 2

## 2015-04-02 MED ORDER — METOPROLOL TARTRATE 1 MG/ML IV SOLN
5.0000 mg | Freq: Four times a day (QID) | INTRAVENOUS | Status: DC | PRN
  Administered 2015-04-02: 5 mg via INTRAVENOUS
  Filled 2015-04-02: qty 5

## 2015-04-02 MED ORDER — SUGAMMADEX SODIUM 200 MG/2ML IV SOLN
INTRAVENOUS | Status: DC | PRN
  Administered 2015-04-02: 200 mg via INTRAVENOUS

## 2015-04-02 MED ORDER — SUGAMMADEX SODIUM 200 MG/2ML IV SOLN
INTRAVENOUS | Status: AC
  Filled 2015-04-02: qty 2

## 2015-04-02 MED ORDER — ACETAMINOPHEN 10 MG/ML IV SOLN
INTRAVENOUS | Status: DC | PRN
  Administered 2015-04-02: 1000 mg via INTRAVENOUS

## 2015-04-02 MED ORDER — HEPARIN SODIUM (PORCINE) 5000 UNIT/ML IJ SOLN
5000.0000 [IU] | INTRAMUSCULAR | Status: AC
  Administered 2015-04-02: 5000 [IU] via SUBCUTANEOUS
  Filled 2015-04-02: qty 1

## 2015-04-02 MED ORDER — OXYCODONE HCL 5 MG/5ML PO SOLN
5.0000 mg | ORAL | Status: DC | PRN
  Administered 2015-04-03 – 2015-04-04 (×2): 5 mg via ORAL
  Administered 2015-04-04: 10 mg via ORAL
  Administered 2015-04-04: 5 mg via ORAL
  Administered 2015-04-05 (×2): 10 mg via ORAL
  Administered 2015-04-05 (×2): 5 mg via ORAL
  Administered 2015-04-06 – 2015-04-07 (×3): 10 mg via ORAL
  Filled 2015-04-02 (×2): qty 10
  Filled 2015-04-02 (×3): qty 5
  Filled 2015-04-02: qty 10
  Filled 2015-04-02 (×3): qty 5
  Filled 2015-04-02: qty 50
  Filled 2015-04-02 (×3): qty 10
  Filled 2015-04-02: qty 50

## 2015-04-02 MED ORDER — BUPIVACAINE LIPOSOME 1.3 % IJ SUSP
INTRAMUSCULAR | Status: DC | PRN
  Administered 2015-04-02: 20 mL

## 2015-04-02 MED ORDER — MORPHINE SULFATE (PF) 2 MG/ML IV SOLN
2.0000 mg | INTRAVENOUS | Status: DC | PRN
  Administered 2015-04-02 (×2): 4 mg via INTRAVENOUS
  Administered 2015-04-02: 2 mg via INTRAVENOUS
  Administered 2015-04-03: 6 mg via INTRAVENOUS
  Administered 2015-04-03 (×5): 4 mg via INTRAVENOUS
  Administered 2015-04-03 – 2015-04-04 (×3): 6 mg via INTRAVENOUS
  Administered 2015-04-05 – 2015-04-06 (×3): 2 mg via INTRAVENOUS
  Administered 2015-04-06: 4 mg via INTRAVENOUS
  Administered 2015-04-07 – 2015-04-08 (×7): 2 mg via INTRAVENOUS
  Filled 2015-04-02: qty 1
  Filled 2015-04-02: qty 2
  Filled 2015-04-02: qty 3
  Filled 2015-04-02: qty 1
  Filled 2015-04-02: qty 2
  Filled 2015-04-02: qty 1
  Filled 2015-04-02 (×2): qty 2
  Filled 2015-04-02: qty 1
  Filled 2015-04-02: qty 3
  Filled 2015-04-02 (×2): qty 1
  Filled 2015-04-02: qty 2
  Filled 2015-04-02: qty 1
  Filled 2015-04-02: qty 2
  Filled 2015-04-02: qty 3
  Filled 2015-04-02 (×2): qty 2
  Filled 2015-04-02: qty 1
  Filled 2015-04-02: qty 2
  Filled 2015-04-02 (×3): qty 1
  Filled 2015-04-02: qty 2

## 2015-04-02 MED ORDER — LIDOCAINE HCL (CARDIAC) 20 MG/ML IV SOLN
INTRAVENOUS | Status: AC
  Filled 2015-04-02: qty 5

## 2015-04-02 MED ORDER — ISOPROPYL ALCOHOL 70 % SOLN
Status: AC
  Filled 2015-04-02: qty 480

## 2015-04-02 MED ORDER — PREMIER PROTEIN SHAKE
2.0000 [oz_av] | Freq: Four times a day (QID) | ORAL | Status: DC
  Administered 2015-04-04 – 2015-04-08 (×15): 2 [oz_av] via ORAL

## 2015-04-02 MED ORDER — LACTATED RINGERS IV SOLN: INTRAVENOUS | Status: DC

## 2015-04-02 MED ORDER — HYDROMORPHONE HCL 1 MG/ML IJ SOLN
INTRAMUSCULAR | Status: AC
  Administered 2015-04-02: 0.25 mg
  Filled 2015-04-02: qty 1

## 2015-04-02 MED ORDER — SODIUM CHLORIDE 0.9 % IJ SOLN
INTRAMUSCULAR | Status: AC
  Filled 2015-04-02: qty 20

## 2015-04-02 MED ORDER — ONDANSETRON HCL 4 MG/2ML IJ SOLN
4.0000 mg | INTRAMUSCULAR | Status: DC | PRN
  Administered 2015-04-03 – 2015-04-06 (×6): 4 mg via INTRAVENOUS
  Filled 2015-04-02 (×7): qty 2

## 2015-04-02 MED ORDER — LIDOCAINE HCL (CARDIAC) 20 MG/ML IV SOLN
INTRAVENOUS | Status: DC | PRN
  Administered 2015-04-02: 25 mg via INTRATRACHEAL
  Administered 2015-04-02: 75 mg via INTRAVENOUS

## 2015-04-02 SURGICAL SUPPLY — 66 items
APPLICATOR COTTON TIP 6IN STRL (MISCELLANEOUS) IMPLANT
APPLIER CLIP ROT 10 11.4 M/L (STAPLE)
APPLIER CLIP ROT 13.4 12 LRG (CLIP)
BLADE HEX COATED 2.75 (ELECTRODE) IMPLANT
BLADE SURG 15 STRL LF DISP TIS (BLADE) ×2 IMPLANT
BLADE SURG 15 STRL SS (BLADE) ×1
CABLE HIGH FREQUENCY MONO STRZ (ELECTRODE) ×3 IMPLANT
CLIP APPLIE ROT 10 11.4 M/L (STAPLE) IMPLANT
CLIP APPLIE ROT 13.4 12 LRG (CLIP) IMPLANT
COVER SURGICAL LIGHT HANDLE (MISCELLANEOUS) IMPLANT
DEVICE SUT QUICK LOAD TK 5 (STAPLE) IMPLANT
DEVICE SUT TI-KNOT TK 5X26 (MISCELLANEOUS) IMPLANT
DEVICE SUTURE ENDOST 10MM (ENDOMECHANICALS) IMPLANT
DEVICE TROCAR PUNCTURE CLOSURE (ENDOMECHANICALS) ×3 IMPLANT
DISSECTOR BLUNT TIP ENDO 5MM (MISCELLANEOUS) ×3 IMPLANT
DRAPE CAMERA CLOSED 9X96 (DRAPES) ×3 IMPLANT
DRAPE INCISE IOBAN 66X45 STRL (DRAPES) ×3 IMPLANT
DRSG TEGADERM 4X4.75 (GAUZE/BANDAGES/DRESSINGS) ×9 IMPLANT
DRSG VASELINE 3X18 (GAUZE/BANDAGES/DRESSINGS) ×3 IMPLANT
ELECT REM PT RETURN 9FT ADLT (ELECTROSURGICAL) ×3
ELECTRODE REM PT RTRN 9FT ADLT (ELECTROSURGICAL) ×2 IMPLANT
GAUZE SPONGE 4X4 12PLY STRL (GAUZE/BANDAGES/DRESSINGS) ×3 IMPLANT
GLOVE BIOGEL M 8.0 STRL (GLOVE) ×3 IMPLANT
GOWN STRL REUS W/TWL XL LVL3 (GOWN DISPOSABLE) ×12 IMPLANT
HANDLE STAPLE EGIA 4 XL (STAPLE) ×3 IMPLANT
HOVERMATT SINGLE USE (MISCELLANEOUS) ×3 IMPLANT
KIT BASIN OR (CUSTOM PROCEDURE TRAY) ×3 IMPLANT
KIT PREVENA INCISION MGT 13 (CANNISTER) ×3 IMPLANT
LIQUID BAND (GAUZE/BANDAGES/DRESSINGS) ×3 IMPLANT
NEEDLE SPNL 22GX3.5 QUINCKE BK (NEEDLE) ×3 IMPLANT
PACK UNIVERSAL I (CUSTOM PROCEDURE TRAY) ×3 IMPLANT
PEN SKIN MARKING BROAD (MISCELLANEOUS) ×3 IMPLANT
POUCH OSTOMY 2  H (OSTOMY) ×3 IMPLANT
RELOAD ENDO STITCH (ENDOMECHANICALS) IMPLANT
RELOAD TRI 45 ART MED THCK BLK (STAPLE) IMPLANT
RELOAD TRI 45 ART MED THCK PUR (STAPLE) IMPLANT
RELOAD TRI 60 ART MED THCK BLK (STAPLE) ×18 IMPLANT
RELOAD TRI 60 ART MED THCK PUR (STAPLE) IMPLANT
SCISSORS LAP 5X45 EPIX DISP (ENDOMECHANICALS) ×3 IMPLANT
SCRUB PCMX 4 OZ (MISCELLANEOUS) ×6 IMPLANT
SEALANT SURGICAL APPL DUAL CAN (MISCELLANEOUS) IMPLANT
SET IRRIG TUBING LAPAROSCOPIC (IRRIGATION / IRRIGATOR) ×3 IMPLANT
SHEARS HARMONIC ACE PLUS 45CM (MISCELLANEOUS) ×3 IMPLANT
SLEEVE ADV FIXATION 5X100MM (TROCAR) ×9 IMPLANT
SLEEVE GASTRECTOMY 36FR VISIGI (MISCELLANEOUS) ×3 IMPLANT
SOLUTION ANTI FOG 6CC (MISCELLANEOUS) ×3 IMPLANT
SPONGE LAP 18X18 X RAY DECT (DISPOSABLE) ×6 IMPLANT
STAPLER VISISTAT 35W (STAPLE) ×3 IMPLANT
SUT NOVA 1 T20/GS 25DT (SUTURE) ×6 IMPLANT
SUT VIC AB 4-0 SH 18 (SUTURE) ×3 IMPLANT
SYR 20CC LL (SYRINGE) ×3 IMPLANT
SYR 50ML LL SCALE MARK (SYRINGE) ×3 IMPLANT
TOWEL OR 17X26 10 PK STRL BLUE (TOWEL DISPOSABLE) ×6 IMPLANT
TOWEL OR NON WOVEN STRL DISP B (DISPOSABLE) ×3 IMPLANT
TRAY FOLEY W/METER SILVER 14FR (SET/KITS/TRAYS/PACK) ×3 IMPLANT
TRAY FOLEY W/METER SILVER 16FR (SET/KITS/TRAYS/PACK) IMPLANT
TROCAR ADV FIXATION 12X100MM (TROCAR) ×3 IMPLANT
TROCAR ADV FIXATION 5X100MM (TROCAR) ×3 IMPLANT
TROCAR BLADELESS 15MM (ENDOMECHANICALS) ×6 IMPLANT
TROCAR BLADELESS OPT 5 100 (ENDOMECHANICALS) ×3 IMPLANT
TUBE CALIBRATION LAPBAND (TUBING) IMPLANT
TUBING CONNECTING 10 (TUBING) ×3 IMPLANT
TUBING ENDO SMARTCAP (MISCELLANEOUS) ×3 IMPLANT
TUBING FILTER THERMOFLATOR (ELECTROSURGICAL) IMPLANT
TUBING INSUFFLATION 10FT LAP (TUBING) ×3 IMPLANT
YANKAUER SUCT BULB TIP 10FT TU (MISCELLANEOUS) ×3 IMPLANT

## 2015-04-02 NOTE — Interval H&P Note (Signed)
History and Physical Interval Note:  04/02/2015 2:30 PM  Emily Sanchez  has presented today for surgery, with the diagnosis of Morbid Obesity  The various methods of treatment have been discussed with the patient and family. After consideration of risks, benefits and other options for treatment, the patient has consented to  Procedure(s): LAPAROSCOPIC GASTRIC SLEEVE RESECTION WITH HIATAL HERNIA REPAIR (N/A) as a surgical intervention .  The patient's history has been reviewed, patient examined, no change in status, stable for surgery.  I have reviewed the patient's chart and labs.  Questions were answered to the patient's satisfaction.     Demauri Advincula B

## 2015-04-02 NOTE — H&P (View-Only) (Signed)
Chief Complaint:  Morbid obesity and ventral herniae  History of Present Illness:  Emily Sanchez is an 59 y.o. female followed by Dr. 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  She has been evaluated for sleeve gastrectomy and is ready to move forward with surgery.  She is aware of the difficulty of operating upon here with all of her abdominal surgery.    Past Medical History  Diagnosis Date  . Depression   . HTN (hypertension)   . Perianal pain     Chronic, Post-Op  . 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 Ulcerative colitis ([place])     Dr Sharlett Iles  . Anxiety   . Low back pain     FMS  . GERD (gastroesophageal reflux disease)   . Osteoarthritis     Dr Alvan Dame; both knees  . Enteritis due to Clostridium difficile 08/16/2012  . Crohn's disease ([place])   . Chronic kidney disease     stage 3     Past Surgical History  Procedure Laterality Date  . Ileostomy    . Colectomy  2001    ulcerative colitis  . Breath tek h pylori N/A 12/05/2013    Procedure: BREATH TEK H PYLORI;  Surgeon: Pedro Earls, MD;  Location: [facility];  Service: General;  Laterality: N/A;    Current Outpatient Prescriptions  Medication Sig Dispense Refill  . bifidobacterium infantis (ALIGN) capsule Take 1 capsule by mouth daily.      . calcium carbonate (OS-CAL) 600 MG TABS tablet Take 600 mg by mouth daily with breakfast.    . Cholecalciferol 1000 UNITS tablet Take 3,000 Units by mouth daily.      . cyanocobalamin (,VITAMIN B-12,) 1000 MCG/ML injection Inject 1 mL (1,000 mcg total) into the muscle every 30 (thirty) days. 10 mL 11  . cyclobenzaprine (FLEXERIL) 5 MG tablet TAKE 1-2 TABLETS BY MOUTH AT BEDTIME AND MAY REPEAT DOSE ONE TIME IF NEEDED 60 tablet 2  . hydrochlorothiazide (MICROZIDE) 12.5 MG capsule Take 1 capsule (12.5 mg total) by mouth daily. 90 capsule 3  . loratadine (CLARITIN) 10 MG tablet Take 1 tablet (10 mg total) by  mouth daily. 100 tablet 3  . Multiple Vitamin (MULTIVITAMIN WITH MINERALS) TABS tablet Take 1 tablet by mouth daily.    Marland Kitchen oxyCODONE (ROXICODONE) 15 MG immediate release tablet Take 1 tablet (15 mg total) by mouth 4 (four) times daily. Please fill on or after  04/29/15 120 tablet 0  . phentermine (ADIPEX-P) 37.5 MG tablet TAKE 1 TABLET BY MOUTH DAILY BEFORE BREAKFAST 30 tablet 2  . SYRINGE-NEEDLE, DISP, 3 ML (B-D INTEGRA SYRINGE) 23G X 1" 3 ML MISC 1 Device by Does not apply route every 30 (thirty) days. For B12 shots 50 each 3   No current facility-administered medications for this visit.   Amphetamine-dextroamphet er; Cymbalta; Erythromycin ethylsuccinate; and Morphine Family History  Problem Relation Age of Onset  . Hypertension    . Heart disease Father 71    CHF  . Depression Sister    Social History:   reports that she has quit smoking. She has never used smokeless tobacco. She reports that she does not drink alcohol or use illicit drugs.   REVIEW OF SYSTEMS : Negative except for see problem list  Physical Exam:   There were no vitals taken for this visit. There is no weight on file to calculate BMI.  Gen:  WDWN AAF NAD  Neurological: Alert  and oriented to person, place, and time. Motor and sensory function is grossly intact  Head: Normocephalic and atraumatic.  Eyes: Conjunctivae are normal. Pupils are equal, round, and reactive to light. No scleral icterus.  Neck: Normal range of motion. Neck supple. No tracheal deviation or thyromegaly present.  Cardiovascular:  SR without murmurs or gallops.  No carotid bruits Breast:  Not examined Respiratory: Effort normal.  No respiratory distress. No chest wall tenderness. Breath sounds normal.  No wheezes, rales or rhonchi.  Abdomen:  Large ventral hernia in the upper midline that is soft and by CT contains small bowel and distal stomach GU: right lower quadrant ileostomy with hernia.   Musculoskeletal: Normal range of motion.  Extremities are nontender. No cyanosis, edema or clubbing noted Lymphadenopathy: No cervical, preauricular, postauricular or axillary adenopathy is present Skin: Skin is warm and dry. No rash noted. No diaphoresis. No erythema. No pallor. Pscyh: Normal mood and affect. Behavior is normal. Judgment and thought content normal.   LABORATORY RESULTS: No results found for this or any previous visit (from the past 48 hour(s)).   RADIOLOGY RESULTS: No results found.  Problem List: Patient Active Problem List   Diagnosis Date Noted  . Status post total colectomy and ileostomy for Crohn's 03/21/2015  . Ventral hernia 03/21/2015  . Well adult exam 02/27/2015  . Allergic rhinitis 08/16/2014  . Elevated serum creatinine 10/17/2012  . ARF (acute renal failure) ([place]) 08/16/2012  . Dehydration 08/16/2012  . Enteritis due to Clostridium difficile 08/16/2012  . Nausea and vomiting 08/16/2012  . Angular stomatitis 03/30/2012  . Anemia, chronic disease 04/08/2011  . CRI (chronic renal insufficiency) 04/08/2011  . GRIEF REACTION 10/23/2009  . Osteoarthritis 06/18/2009  . KNEE PAIN 06/18/2009  . RUQ PAIN 02/14/2009  . PNEUMONIA 11/06/2008  . COUGH 11/06/2008  . FIBROMYALGIA 08/13/2008  . OBESITY, MORBID 01/20/2008  . INSOMNIA, PERSISTENT 10/18/2007  . FATIGUE 10/18/2007  . LEG PAIN 09/16/2007  . VITAMIN D DEFICIENCY 07/15/2007  . WEIGHT GAIN 07/15/2007  . Acute upper respiratory infection 04/26/2007  . Anxiety state 02/17/2007  . GERD 02/17/2007  . LOW BACK PAIN 02/17/2007  . B12 deficiency 11/08/2006  . PANIC ATTACK 11/08/2006  . DEPRESSION 11/08/2006  . Essential hypertension 11/08/2006  . Crohn's disease ([place]) 11/08/2006    Assessment & Plan: Complicated abdominal wall for sleeve gastrectomy.  I have explained the procedure to her in some detail.      Matt B. Hassell Done, MD, [facility] [phone] beeper [phone]  03/21/2015 4:04  PM

## 2015-04-02 NOTE — Transfer of Care (Signed)
Immediate Anesthesia Transfer of Care Note  Patient: Emily Sanchez  Procedure(s) Performed: Procedure(s): LAPAROSCOPIC LYSIS OF ADHESIONS GASTRIC SLEEVE RESECTION WITH OPEN VENTRAL HERNIA REPAIR (N/A) UPPER GI ENDOSCOPY LAPAROSCOPIC LYSIS OF ADHESIONS HERNIA REPAIR VENTRAL ADULT  Patient Location: PACU  Anesthesia Type:General  Level of Consciousness: awake, alert , oriented and patient cooperative  Airway & Oxygen Therapy: Patient Spontanous Breathing and Patient connected to nasal cannula oxygen  Post-op Assessment: Report given to RN, Post -op Vital signs reviewed and stable and Patient moving all extremities X 4  Post vital signs: stable  Last Vitals:  Filed Vitals:   04/02/15 1050 04/02/15 1055  BP: 156/94   Pulse:  91  Temp: 36.7 C   Resp: 18     Complications: No apparent anesthesia complications  Nasal cannula pt claustraphobic

## 2015-04-02 NOTE — OR Nursing (Signed)
Phone call to Dr. Delma Post to advise BP 169/97.  No new orders at this time and continue to monitor.

## 2015-04-02 NOTE — Brief Op Note (Signed)
04/02/2015  6:28 PM  PATIENT:  Emily Sanchez  58 y.o. female  PRE-OPERATIVE DIAGNOSIS:  Morbid Obesity  POST-OPERATIVE DIAGNOSIS:  Morbid Obesity  PROCEDURE:  Procedure(s): LAPAROSCOPIC LYSIS OF ADHESIONS GASTRIC SLEEVE RESECTION WITH OPEN VENTRAL HERNIA REPAIR (N/A) UPPER GI ENDOSCOPY LAPAROSCOPIC LYSIS OF ADHESIONS HERNIA REPAIR VENTRAL ADULT  SURGEON:  Surgeon(s) and Role:    * Johnathan Hausen, MD - Primary    * Mickeal Skinner, MD - Assisting  PHYSICIAN ASSISTANT:   ASSISTANTS: Gurney Maxin, MD   ANESTHESIA:   general  EBL:  Total I/O In: 2000 [I.V.:2000] Out: 150 [Urine:100; Blood:50]  BLOOD ADMINISTERED:none  DRAINS: none   LOCAL MEDICATIONS USED:  BUPIVICAINE   SPECIMEN:  Source of Specimen:  stomach  DISPOSITION OF SPECIMEN:  PATHOLOGY  COUNTS:  YES  TOURNIQUET:  * No tourniquets in log *  DICTATION: .Other Dictation: Dictation Number [id number]  PLAN OF CARE: Admit to inpatient   PATIENT DISPOSITION:  PACU - hemodynamically stable.   Delay start of Pharmacological VTE agent (>24hrs) due to surgical blood loss or risk of bleeding: yes

## 2015-04-02 NOTE — Anesthesia Preprocedure Evaluation (Signed)
Anesthesia Evaluation  Patient identified by MRN, date of birth, ID band Patient awake    Reviewed: Allergy & Precautions, H&P , NPO status , [name] Chart, lab work & pertinent test results  Airway Mallampati: II  TM Distance: >3 FB Neck ROM: full    Dental no notable dental hx. (+) Dental Advisory Given, Teeth Intact   Pulmonary neg pulmonary ROS, former smoker,    Pulmonary exam normal breath sounds clear to auscultation       Cardiovascular hypertension, Pt. on medications  Rhythm:regular Rate:Normal     Neuro/Psych Anxiety Depression negative neurological ROS  negative psych ROS   GI/Hepatic negative GI ROS, Neg liver ROS,   Endo/Other  Morbid obesity  Renal/GU Renal InsufficiencyRenal diseaseStage 3 kidney disease  negative genitourinary   Musculoskeletal   Abdominal (+) + obese,   Peds  Hematology negative hematology ROS (+)   Anesthesia Other Findings   Reproductive/Obstetrics negative OB ROS                             Anesthesia Physical Anesthesia Plan  ASA: III  Anesthesia Plan: General   Post-op Pain Management:    Induction: Intravenous  Airway Management Planned: Oral ETT  Additional Equipment:   Intra-op Plan:   Post-operative Plan: Extubation in OR  Informed Consent: I have reviewed the patients History and Physical, chart, labs and discussed the procedure including the risks, benefits and alternatives for the proposed anesthesia with the patient or authorized representative who has indicated his/her understanding and acceptance.   Dental Advisory Given  Plan Discussed with: CRNA and Surgeon  Anesthesia Plan Comments:         Anesthesia Quick Evaluation

## 2015-04-02 NOTE — Anesthesia Procedure Notes (Signed)
Procedure Name: Intubation Date/Time: 04/02/2015 2:58 PM Performed by: Lissa Morales Pre-anesthesia Checklist: Patient identified, Emergency Drugs available, Suction available and Patient being monitored Patient Re-evaluated:Patient Re-evaluated prior to inductionOxygen Delivery Method: Circle System Utilized Preoxygenation: Pre-oxygenation with 100% oxygen Intubation Type: IV induction Ventilation: Mask ventilation without difficulty Laryngoscope Size: Mac and 4 Grade View: Grade II Tube type: Oral Tube size: 7.0 mm Number of attempts: 1 Airway Equipment and Method: Stylet and Oral airway Placement Confirmation: ETT inserted through vocal cords under direct vision,  positive ETCO2 and breath sounds checked- equal and bilateral Secured at: 21 cm Tube secured with: Tape Dental Injury: Teeth and Oropharynx as per pre-operative assessment

## 2015-04-02 NOTE — Op Note (Signed)
Preoperative diagnosis: laparoscopic sleeve gastrectomy  Postoperative diagnosis: Same   Procedure: Upper endoscopy   Surgeon: Gurney Maxin, M.D.  Anesthesia: Gen.   Indications for procedure: This patient was undergoing a laparoscopic sleeve gastrectomy.   Description of procedure: The endoscopy was placed in the mouth and into the oropharynx and under endoscopic vision it was advanced to the esophagogastric junction. The pouch was insufflated and no bleeding or bubbles were seen. The GEJ was identified at 36cm from the teeth. No bleeding or leaks were detected. The scope was withdrawn without difficulty.   Gurney Maxin, M.D. General, Bariatric, & Minimally Invasive Surgery [facility]

## 2015-04-02 NOTE — Anesthesia Postprocedure Evaluation (Signed)
Anesthesia Post Note  Patient: [name]  Procedure(s) Performed: Procedure(s) (LRB): LAPAROSCOPIC LYSIS OF ADHESIONS GASTRIC SLEEVE RESECTION WITH OPEN VENTRAL HERNIA REPAIR (N/A) UPPER GI ENDOSCOPY LAPAROSCOPIC LYSIS OF ADHESIONS HERNIA REPAIR VENTRAL ADULT  Patient location during evaluation: PACU Anesthesia Type: General Level of consciousness: awake and alert Pain management: pain level controlled Vital Signs Assessment: post-procedure vital signs reviewed and stable Respiratory status: spontaneous breathing, nonlabored ventilation, respiratory function stable and patient connected to nasal cannula oxygen Cardiovascular status: blood pressure returned to baseline and stable Postop Assessment: no signs of nausea or vomiting Anesthetic complications: no Comments: BP at baseline levels.    Last Vitals:  Filed Vitals:   04/02/15 1915 04/02/15 1945  BP: 165/99 173/100  Pulse: 66 73  Temp:    Resp: 12 19    Last Pain:  Filed Vitals:   04/02/15 1950  PainSc: 6                  Candler Ginsberg J

## 2015-04-03 ENCOUNTER — Inpatient Hospital Stay (HOSPITAL_COMMUNITY): Payer: Medicare Other

## 2015-04-03 ENCOUNTER — Encounter (HOSPITAL_COMMUNITY): Payer: Self-pay | Admitting: Surgery

## 2015-04-03 DIAGNOSIS — M79609 Pain in unspecified limb: Secondary | ICD-10-CM

## 2015-04-03 LAB — CBC WITH DIFFERENTIAL/PLATELET
BASOS PCT: 0 %
Basophils Absolute: 0 10*3/uL (ref 0.0–0.1)
EOS ABS: 0 10*3/uL (ref 0.0–0.7)
EOS PCT: 0 %
HCT: 31.3 % — ABNORMAL LOW (ref 36.0–46.0)
HEMOGLOBIN: 10.3 g/dL — AB (ref 12.0–15.0)
Lymphocytes Relative: 13 %
Lymphs Abs: 1.4 10*3/uL (ref 0.7–4.0)
MCH: 28.3 pg (ref 26.0–34.0)
MCHC: 32.9 g/dL (ref 30.0–36.0)
MCV: 86 fL (ref 78.0–100.0)
Monocytes Absolute: 0.3 10*3/uL (ref 0.1–1.0)
Monocytes Relative: 3 %
NEUTROS PCT: 85 %
Neutro Abs: 9.1 10*3/uL — ABNORMAL HIGH (ref 1.7–7.7)
PLATELETS: 292 10*3/uL (ref 150–400)
RBC: 3.64 MIL/uL — AB (ref 3.87–5.11)
RDW: 14.7 % (ref 11.5–15.5)
WBC: 10.8 10*3/uL — AB (ref 4.0–10.5)

## 2015-04-03 LAB — HEMOGLOBIN AND HEMATOCRIT, BLOOD
HEMATOCRIT: 30.4 % — AB (ref 36.0–46.0)
HEMOGLOBIN: 9.8 g/dL — AB (ref 12.0–15.0)

## 2015-04-03 NOTE — Op Note (Signed)
NAMERONNA, HERSKOWITZ NO.:  [id number]  MEDICAL RECORD NO.:  [id number]  LOCATION:  23                         FACILITY:  [facility]  PHYSICIAN:  Isabel Caprice. Hassell Done, MD  DATE OF BIRTH:  Feb 09, 1956  DATE OF PROCEDURE: DATE OF DISCHARGE:                              OPERATIVE REPORT   PROCEDURE:  Laparoscopic takedown of adhesions and reduction of incarcerated ventral hernia, laparoscopic sleeve gastrectomy, open repair of upper midline incisional hernia.  DIAGNOSIS:  Proctocolectomy with permanent right lower quadrant ileostomy wound class 3 due to the ileostomy.  SURGEON:  Isabel Caprice. Hassell Done, MD.  ASSISTANT:  Virgel Manifold.  ANESTHESIA:  General endotracheal.  OPERATIVE TIME:  Approximately 3 hours.  DESCRIPTION OF PROCEDURE:  The patient was taken to room 1 and given general anesthesia.  The abdomen is prepped with PCMX and draped sterilely.  The ostomy had an occlusive dressing applied and was covered with Tegaderm.  The abdomen was entered through the left upper quadrant using 5 mm Optiview without difficulty.  Following insufflation, encountered a bunch of adhesions to the midline and the hernia had incarcerated loops of small bowel.  I placed some peripheral 5 mm around it and retracted the small bowel and sharply took these down and reduced them from the hernia sac.  Once this was done, I was able to create a port sites for the sleeve gastrectomy.  The patient's the stomach had been pretty much anatomically distorted to fact that she had a total colectomy and the stomach had been pulled up into her hernia including the stomach and duodenum.  We went in and took, and went along the greater curvature, entered into the lesser sac and used Harmonic Scalpel to mobilize the stomach all the way up to the left crus.  I came down to just above 4 cm from the pylorus.  The 36 VCG was passed into the distal antrum.  This was used for the sleeve, which was  performed with all 6 cm black Covidien staples with TRS.  The sleeve was performed.  The patient had an upper endoscopy by Dr. Cherlyn Roberts and we reviewed the cylindrical nature of the tube and looked at it and looked good.  The scope was then withdrawn.  I made a small transverse incision incorporating my prior 15 mm trocar, which went through the hernia sac.  I had grasped the stomach and brought it out in toto in her hernia incision.  I then closed the hernia primarily with #1 Novafil's, taking wide bites feeling all the time to make sure did not get any bowel in this and with simple sutures approximated the fascial defect.  I injected the area with Exparel.  All the ports were injected with Exparel.  Wounds were closed with 4-0 Vicryl.  The skin was wiped with alcohol and cleaned and then LiquiBand was used on the trocar sites, which were closed with 4-0 Vicryl and then applied the __________ suction device on the transverse incision, which was approximately 2-1/2 inches transversely.  I then applied a new ostomy appliance.  The patient was taken to the recovery room in satisfactory condition.  Sponge and needle counts were correct.  We  did survey the abdomen.  After I did the closure and I looked at the closure from the inside with the laparoscope and everything appeared to be free and clear.  There were some adhesions of the small bowel to the anterior abdominal wall further down and she does have a hernia around her ileostomy that is quite large as well.  The patient will be taken to step-down postop.     Isabel Caprice Hassell Done, MD     MBM/MEDQ  D:  04/02/2015  T:  04/03/2015  Job:  [id number]  cc:   Evie Lacks. Plotnikov, MD [address] [place] [address]

## 2015-04-03 NOTE — Plan of Care (Signed)
Problem: Food- and Nutrition-Related Knowledge Deficit (NB-1.1) Goal: Nutrition education Formal process to instruct or train a patient/client in a skill or to impart knowledge to help patients/clients voluntarily manage or modify food choices and eating behavior to maintain or improve health. Outcome: Completed/Met Date Met:  04/03/15 Nutrition Education Note  Received consult for diet education per DROP protocol.   Discussed 2 week post op diet with pt. Emphasized that liquids must be non carbonated, non caffeinated, and sugar free. Fluid goals discussed. Pt to follow up with outpatient bariatric RD for further diet progression after 2 weeks. Multivitamins and minerals also reviewed. Teach back method used, pt expressed understanding, expect good compliance.   Diet: First 2 Weeks  You will see the nutritionist about two (2) weeks after your surgery. The nutritionist will increase the types of foods you can eat if you are handling liquids well:  If you have severe vomiting or nausea and cannot handle clear liquids lasting longer than 1 day, call your surgeon  Protein Shake  Drink at least 2 ounces of shake 5-6 times per day  Each serving of protein shakes (usually 8 - 12 ounces) should have a minimum of:  15 grams of protein  And no more than 5 grams of carbohydrate  Goal for protein each day:  Men = 80 grams per day  Women = 60 grams per day  Protein powder may be added to fluids such as non-fat milk or Lactaid milk or Soy milk (limit to 35 grams added protein powder per serving)   Hydration  Slowly increase the amount of water and other clear liquids as tolerated (See Acceptable Fluids)  Slowly increase the amount of protein shake as tolerated  Sip fluids slowly and throughout the day  May use sugar substitutes in small amounts (no more than 6 - 8 packets per day; i.e. Splenda)   Fluid Goal  The first goal is to drink at least 8 ounces of protein shake/drink per day (or as directed  by the nutritionist); some examples of protein shakes are [employer], [employer], EAS Edge HP, and Unjury. See handout from pre-op Bariatric Education Class:  Slowly increase the amount of protein shake you drink as tolerated  You may find it easier to slowly sip shakes throughout the day  It is important to get your proteins in first  Your fluid goal is to drink 64 - 100 ounces of fluid daily  It may take a few weeks to build up to this  32 oz (or more) should be clear liquids  And  32 oz (or more) should be full liquids (see below for examples)  Liquids should not contain sugar, caffeine, or carbonation   Clear Liquids:  Water or Sugar-free flavored water (i.e. Fruit H2O, Propel)  Decaffeinated coffee or tea (sugar-free)  Crystal Lite, Wyler's Lite, Minute Maid Lite  Sugar-free Jell-O  Bouillon or broth  Sugar-free Popsicle: *Less than 20 calories each; Limit 1 per day   Full Liquids:  Protein Shakes/Drinks + 2 choices per day of other full liquids  Full liquids must be:  No More Than 12 grams of Carbs per serving  No More Than 3 grams of Fat per serving  Strained low-fat cream soup  Non-Fat milk  Fat-free Lactaid Milk  Sugar-free yogurt (Dannon Lite & Fit, Greek yogurt)     Clayton Bibles, MS, RD, LDN Pager: [phone] After Hours Pager: [phone]

## 2015-04-03 NOTE — Progress Notes (Signed)
Patient alert and oriented, Post op day 1.  Provided support and encouragement.  Encouraged pulmonary toilet, ambulation and small sips of liquids.  All questions answered.  Will continue to monitor. 

## 2015-04-03 NOTE — Progress Notes (Signed)
VASCULAR LAB PRELIMINARY  PRELIMINARY  PRELIMINARY  PRELIMINARY  Bilateral lower extremity venous duplex completed.    Preliminary report:  Bilateral:  No obvious evidence of DVT or superficial thrombosis.  Positive for Baker's cyst in the right popliteal fossa.   [place], RVS 04/03/2015, 3:14 PM

## 2015-04-03 NOTE — Care Management Note (Signed)
Case Management Note  Patient Details  Name: Emily Sanchez MRN: [id number] Date of Birth: 30-Sep-1955  Subjective/Objective:       Gastric sleeve             Action/Plan:Date: April 03, 2015 Chart reviewed for concurrent status and case management needs. Will continue to follow patient for changes and needs: Velva Harman, RN, BSN, [place]   [phone]   Expected Discharge Date:  04/04/15               Expected Discharge Plan:     In-House Referral:     Discharge planning Services     Post Acute Care Choice:    Choice offered to:     DME Arranged:    DME Agency:     HH Arranged:    [place] Agency:     Status of Service:     Medicare Important Message Given:    Date Medicare IM Given:    Medicare IM give by:    Date Additional Medicare IM Given:    Additional Medicare Important Message give by:     If discussed at [place] of Stay Meetings, dates discussed:    Additional Comments:  Leeroy Cha, RN 04/03/2015, 11:39 AM

## 2015-04-04 LAB — CBC
HEMATOCRIT: 24.5 % — AB (ref 36.0–46.0)
HEMOGLOBIN: 7.9 g/dL — AB (ref 12.0–15.0)
MCH: 28.5 pg (ref 26.0–34.0)
MCHC: 32.2 g/dL (ref 30.0–36.0)
MCV: 88.4 fL (ref 78.0–100.0)
Platelets: 249 10*3/uL (ref 150–400)
RBC: 2.77 MIL/uL — AB (ref 3.87–5.11)
RDW: 15.6 % — ABNORMAL HIGH (ref 11.5–15.5)
WBC: 14.4 10*3/uL — ABNORMAL HIGH (ref 4.0–10.5)

## 2015-04-04 LAB — CBC WITH DIFFERENTIAL/PLATELET
BASOS ABS: 0 10*3/uL (ref 0.0–0.1)
BASOS ABS: 0 10*3/uL (ref 0.0–0.1)
BASOS PCT: 0 %
BASOS PCT: 0 %
Eosinophils Absolute: 0 10*3/uL (ref 0.0–0.7)
Eosinophils Absolute: 0 10*3/uL (ref 0.0–0.7)
Eosinophils Relative: 0 %
Eosinophils Relative: 0 %
HEMATOCRIT: 24.8 % — AB (ref 36.0–46.0)
HEMATOCRIT: 24.1 % — AB (ref 36.0–46.0)
HEMOGLOBIN: 7.8 g/dL — AB (ref 12.0–15.0)
Hemoglobin: 8.1 g/dL — ABNORMAL LOW (ref 12.0–15.0)
Lymphocytes Relative: 20 %
Lymphocytes Relative: 20 %
Lymphs Abs: 2.7 10*3/uL (ref 0.7–4.0)
Lymphs Abs: 2.5 10*3/uL (ref 0.7–4.0)
MCH: 28.6 pg (ref 26.0–34.0)
MCH: 28.4 pg (ref 26.0–34.0)
MCHC: 32.7 g/dL (ref 30.0–36.0)
MCHC: 32.4 g/dL (ref 30.0–36.0)
MCV: 87.6 fL (ref 78.0–100.0)
MCV: 87.6 fL (ref 78.0–100.0)
MONO ABS: 1.6 10*3/uL — AB (ref 0.1–1.0)
MONOS PCT: 12 %
Monocytes Absolute: 1.5 10*3/uL — ABNORMAL HIGH (ref 0.1–1.0)
Monocytes Relative: 12 %
NEUTROS ABS: 8.5 10*3/uL — AB (ref 1.7–7.7)
NEUTROS PCT: 68 %
NEUTROS PCT: 68 %
Neutro Abs: 8.9 10*3/uL — ABNORMAL HIGH (ref 1.7–7.7)
PLATELETS: 268 10*3/uL (ref 150–400)
Platelets: 251 10*3/uL (ref 150–400)
RBC: 2.83 MIL/uL — ABNORMAL LOW (ref 3.87–5.11)
RBC: 2.75 MIL/uL — AB (ref 3.87–5.11)
RDW: 15.4 % (ref 11.5–15.5)
RDW: 15.6 % — ABNORMAL HIGH (ref 11.5–15.5)
WBC: 13.2 10*3/uL — AB (ref 4.0–10.5)
WBC: 12.5 10*3/uL — AB (ref 4.0–10.5)

## 2015-04-04 LAB — BASIC METABOLIC PANEL
Anion gap: 10 (ref 5–15)
BUN: 22 mg/dL — ABNORMAL HIGH (ref 6–20)
CHLORIDE: 107 mmol/L (ref 101–111)
CO2: 21 mmol/L — AB (ref 22–32)
CREATININE: 1.42 mg/dL — AB (ref 0.44–1.00)
Calcium: 8.7 mg/dL — ABNORMAL LOW (ref 8.9–10.3)
GFR calc non Af Amer: 40 mL/min — ABNORMAL LOW (ref >60)
GFR, EST AFRICAN AMERICAN: 46 mL/min — AB (ref >60)
Glucose, Bld: 105 mg/dL — ABNORMAL HIGH (ref 65–99)
POTASSIUM: 4.5 mmol/L (ref 3.5–5.1)
Sodium: 138 mmol/L (ref 135–145)

## 2015-04-04 LAB — APTT: APTT: 27 s (ref 24–37)

## 2015-04-04 LAB — ABO/RH: ABO/RH(D): O POS

## 2015-04-04 LAB — PROTIME-INR
INR: 1.2 (ref 0.00–1.49)
Prothrombin Time: 15.3 seconds — ABNORMAL HIGH (ref 11.6–15.2)

## 2015-04-04 MED ORDER — SODIUM CHLORIDE 0.9 % IV SOLN: Freq: Once | INTRAVENOUS | Status: DC

## 2015-04-04 MED ORDER — SODIUM CHLORIDE 0.9 % IV SOLN
INTRAVENOUS | Status: DC
  Administered 2015-04-04: 250 mL/h via INTRAVENOUS

## 2015-04-04 NOTE — Progress Notes (Signed)
Patient alert and oriented, pain is controlled. Patient is tolerating fluids,  advanced to protein shake today, patient tolerated well.  Reviewed Gastric sleeve discharge instructions with patient and patient is able to articulate understanding.  Provided information on BELT program, Support Group and [facility] outpatient pharmacy. All questions answered, will continue to monitor.  

## 2015-04-04 NOTE — Progress Notes (Signed)
Patient ID: Emily Sanchez, female   DOB: [birth year], 59 y.o.   MRN: [id number] [facility] Surgery Progress Note:   2 Days Post-Op  Subjective: Mental status is clear.  No complaints.  Was up walking today Objective: Vital signs in last 24 hours: Temp:  [98.1 F (36.7 C)-99.2 F (37.3 C)] 99.2 F (37.3 C) (12/15 2135) Pulse Rate:  [57-125] 121 (12/15 2135) Resp:  [18-20] 18 (12/15 2135) BP: (114-155)/(61-81) 147/78 mmHg (12/15 2135) SpO2:  [97 %-100 %] 100 % (12/15 2135)  Intake/Output from previous day: 12/14 0701 - 12/15 0700 In: 1320 [P.O.:120; I.V.:1200] Out: 600 [Urine:600] Intake/Output this shift: Total I/O In: -  Out: 450 [Urine:350; Stool:100]  Physical Exam: Work of breathing is not elevated.  Sore but nontender  Lab Results:  Results for orders placed or performed during the hospital encounter of 04/02/15 (from the past 48 hour(s))  CBC WITH DIFFERENTIAL     Status: Abnormal   Collection Time: 04/03/15  3:43 AM  Result Value Ref Range   WBC 10.8 (H) 4.0 - 10.5 K/uL   RBC 3.64 (L) 3.87 - 5.11 MIL/uL   Hemoglobin 10.3 (L) 12.0 - 15.0 g/dL    Comment: DELTA CHECK NOTED REPEATED TO VERIFY    HCT 31.3 (L) 36.0 - 46.0 %   MCV 86.0 78.0 - 100.0 fL   MCH 28.3 26.0 - 34.0 pg   MCHC 32.9 30.0 - 36.0 g/dL   RDW 14.7 11.5 - 15.5 %   Platelets 292 150 - 400 K/uL   Neutrophils Relative % 85 %   Neutro Abs 9.1 (H) 1.7 - 7.7 K/uL   Lymphocytes Relative 13 %   Lymphs Abs 1.4 0.7 - 4.0 K/uL   Monocytes Relative 3 %   Monocytes Absolute 0.3 0.1 - 1.0 K/uL   Eosinophils Relative 0 %   Eosinophils Absolute 0.0 0.0 - 0.7 K/uL   Basophils Relative 0 %   Basophils Absolute 0.0 0.0 - 0.1 K/uL  Hemoglobin and hematocrit, blood     Status: Abnormal   Collection Time: 04/03/15  4:06 PM  Result Value Ref Range   Hemoglobin 9.8 (L) 12.0 - 15.0 g/dL   HCT 30.4 (L) 36.0 - 46.0 %  CBC with Differential     Status: Abnormal   Collection Time: 04/04/15  5:30 AM  Result Value  Ref Range   WBC 13.2 (H) 4.0 - 10.5 K/uL   RBC 2.83 (L) 3.87 - 5.11 MIL/uL   Hemoglobin 8.1 (L) 12.0 - 15.0 g/dL   HCT 24.8 (L) 36.0 - 46.0 %   MCV 87.6 78.0 - 100.0 fL   MCH 28.6 26.0 - 34.0 pg   MCHC 32.7 30.0 - 36.0 g/dL   RDW 15.4 11.5 - 15.5 %   Platelets 268 150 - 400 K/uL   Neutrophils Relative % 68 %   Neutro Abs 8.9 (H) 1.7 - 7.7 K/uL   Lymphocytes Relative 20 %   Lymphs Abs 2.7 0.7 - 4.0 K/uL   Monocytes Relative 12 %   Monocytes Absolute 1.6 (H) 0.1 - 1.0 K/uL   Eosinophils Relative 0 %   Eosinophils Absolute 0.0 0.0 - 0.7 K/uL   Basophils Relative 0 %   Basophils Absolute 0.0 0.0 - 0.1 K/uL  CBC with Differential/Platelet     Status: Abnormal   Collection Time: 04/04/15 11:24 AM  Result Value Ref Range   WBC 12.5 (H) 4.0 - 10.5 K/uL   RBC 2.75 (L) 3.87 - 5.11 MIL/uL  Hemoglobin 7.8 (L) 12.0 - 15.0 g/dL   HCT 24.1 (L) 36.0 - 46.0 %   MCV 87.6 78.0 - 100.0 fL   MCH 28.4 26.0 - 34.0 pg   MCHC 32.4 30.0 - 36.0 g/dL   RDW 15.6 (H) 11.5 - 15.5 %   Platelets 251 150 - 400 K/uL   Neutrophils Relative % 68 %   Neutro Abs 8.5 (H) 1.7 - 7.7 K/uL   Lymphocytes Relative 20 %   Lymphs Abs 2.5 0.7 - 4.0 K/uL   Monocytes Relative 12 %   Monocytes Absolute 1.5 (H) 0.1 - 1.0 K/uL   Eosinophils Relative 0 %   Eosinophils Absolute 0.0 0.0 - 0.7 K/uL   Basophils Relative 0 %   Basophils Absolute 0.0 0.0 - 0.1 K/uL  Type and screen [place]     Status: None   Collection Time: 04/04/15 11:24 AM  Result Value Ref Range   ABO/RH(D) O POS    Antibody Screen NEG    Sample Expiration 04/07/2015   ABO/Rh     Status: None   Collection Time: 04/04/15 11:30 AM  Result Value Ref Range   ABO/RH(D) O POS   Protime-INR     Status: Abnormal   Collection Time: 04/04/15  3:30 PM  Result Value Ref Range   Prothrombin Time 15.3 (H) 11.6 - 15.2 seconds   INR 1.20 0.00 - 1.49  APTT     Status: None   Collection Time: 04/04/15  3:30 PM  Result Value Ref Range   aPTT  27 24 - 37 seconds  CBC     Status: Abnormal   Collection Time: 04/04/15  3:30 PM  Result Value Ref Range   WBC 14.4 (H) 4.0 - 10.5 K/uL   RBC 2.77 (L) 3.87 - 5.11 MIL/uL   Hemoglobin 7.9 (L) 12.0 - 15.0 g/dL   HCT 24.5 (L) 36.0 - 46.0 %   MCV 88.4 78.0 - 100.0 fL   MCH 28.5 26.0 - 34.0 pg   MCHC 32.2 30.0 - 36.0 g/dL   RDW 15.6 (H) 11.5 - 15.5 %   Platelets 249 150 - 400 K/uL  Basic metabolic panel     Status: Abnormal   Collection Time: 04/04/15  3:30 PM  Result Value Ref Range   Sodium 138 135 - 145 mmol/L   Potassium 4.5 3.5 - 5.1 mmol/L   Chloride 107 101 - 111 mmol/L   CO2 21 (L) 22 - 32 mmol/L   Glucose, Bld 105 (H) 65 - 99 mg/dL   BUN 22 (H) 6 - 20 mg/dL   Creatinine, Ser 1.42 (H) 0.44 - 1.00 mg/dL   Calcium 8.7 (L) 8.9 - 10.3 mg/dL   GFR calc non Af Amer 40 (L) >60 mL/min   GFR calc Af Amer 46 (L) >60 mL/min    Comment: (NOTE) The eGFR has been calculated using the CKD EPI equation. This calculation has not been validated in all clinical situations. eGFR's persistently <60 mL/min signify possible Chronic Kidney Disease.    Anion gap 10 5 - 15    Radiology/Results: No results found.  Anti-infectives: Anti-infectives    Start     Dose/Rate Route Frequency Ordered Stop   04/02/15 1041  cefOXitin (MEFOXIN) 2 g in dextrose 5 % 50 mL IVPB     2 g 100 mL/hr over 30 Minutes Intravenous On call to O.R. 04/02/15 1041 04/02/15 1725      Assessment/Plan: Problem List: Patient Active Problem List  Diagnosis Date Noted  . 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 Status post total colectomy and ileostomy for Crohn's 03/21/2015  . Ventral hernia 03/21/2015  . Well adult exam 02/27/2015  . Allergic rhinitis 08/16/2014  . Elevated serum creatinine 10/17/2012  . ARF (acute renal failure) ([place]) 08/16/2012  . Dehydration 08/16/2012  . Enteritis due to Clostridium difficile 08/16/2012  . Nausea and vomiting 08/16/2012  . Angular stomatitis 03/30/2012  .  Anemia, chronic disease 04/08/2011  . CRI (chronic renal insufficiency) 04/08/2011  . GRIEF REACTION 10/23/2009  . Osteoarthritis 06/18/2009  . KNEE PAIN 06/18/2009  . RUQ PAIN 02/14/2009  . PNEUMONIA 11/06/2008  . COUGH 11/06/2008  . FIBROMYALGIA 08/13/2008  . OBESITY, MORBID 01/20/2008  . INSOMNIA, PERSISTENT 10/18/2007  . FATIGUE 10/18/2007  . LEG PAIN 09/16/2007  . VITAMIN D DEFICIENCY 07/15/2007  . WEIGHT GAIN 07/15/2007  . Acute upper respiratory infection 04/26/2007  . Anxiety state 02/17/2007  . GERD 02/17/2007  . LOW BACK PAIN 02/17/2007  . B12 deficiency 11/08/2006  . PANIC ATTACK 11/08/2006  . DEPRESSION 11/08/2006  . Essential hypertension 11/08/2006  . Crohn's disease ([place]) 11/08/2006    Apical pulse checked early this am by me.  Hg drift downward likely blood loss related to surgery.  Heparin stopped.  Hg seems to be holding at above 7.  On pulse ox to keep an eye on perfusion.  HR faster than apical HR.  Recheck lab in AM 2 Days Post-Op    LOS: 2 days   Matt B. Hassell Done, MD, [facility] [phone] beeper [phone]  04/04/2015 10:06 PM

## 2015-04-04 NOTE — Discharge Instructions (Signed)

## 2015-04-05 LAB — CBC WITH DIFFERENTIAL/PLATELET
BASOS ABS: 0 10*3/uL (ref 0.0–0.1)
Basophils Relative: 0 %
EOS ABS: 0 10*3/uL (ref 0.0–0.7)
Eosinophils Relative: 0 %
HCT: 21 % — ABNORMAL LOW (ref 36.0–46.0)
HEMOGLOBIN: 6.8 g/dL — AB (ref 12.0–15.0)
LYMPHS ABS: 3.3 10*3/uL (ref 0.7–4.0)
LYMPHS PCT: 27 %
MCH: 28.6 pg (ref 26.0–34.0)
MCHC: 32.4 g/dL (ref 30.0–36.0)
MCV: 88.2 fL (ref 78.0–100.0)
Monocytes Absolute: 1.5 10*3/uL — ABNORMAL HIGH (ref 0.1–1.0)
Monocytes Relative: 12 %
NEUTROS PCT: 60 %
Neutro Abs: 7.4 10*3/uL (ref 1.7–7.7)
Platelets: 219 10*3/uL (ref 150–400)
RBC: 2.38 MIL/uL — AB (ref 3.87–5.11)
RDW: 15.7 % — ABNORMAL HIGH (ref 11.5–15.5)
WBC: 12.2 10*3/uL — AB (ref 4.0–10.5)

## 2015-04-05 LAB — PREPARE RBC (CROSSMATCH)

## 2015-04-05 LAB — COMPREHENSIVE METABOLIC PANEL
ALT: 12 U/L — ABNORMAL LOW (ref 14–54)
AST: 14 U/L — ABNORMAL LOW (ref 15–41)
Albumin: 2.9 g/dL — ABNORMAL LOW (ref 3.5–5.0)
Alkaline Phosphatase: 48 U/L (ref 38–126)
Anion gap: 7 (ref 5–15)
BUN: 19 mg/dL (ref 6–20)
CHLORIDE: 106 mmol/L (ref 101–111)
CO2: 25 mmol/L (ref 22–32)
Calcium: 8.6 mg/dL — ABNORMAL LOW (ref 8.9–10.3)
Creatinine, Ser: 1.18 mg/dL — ABNORMAL HIGH (ref 0.44–1.00)
GFR, EST AFRICAN AMERICAN: 57 mL/min — AB (ref >60)
GFR, EST NON AFRICAN AMERICAN: 49 mL/min — AB (ref >60)
Glucose, Bld: 125 mg/dL — ABNORMAL HIGH (ref 65–99)
POTASSIUM: 4.2 mmol/L (ref 3.5–5.1)
Sodium: 138 mmol/L (ref 135–145)
TOTAL PROTEIN: 5.9 g/dL — AB (ref 6.5–8.1)
Total Bilirubin: 0.6 mg/dL (ref 0.3–1.2)

## 2015-04-05 MED ORDER — SODIUM CHLORIDE 0.9 % IV SOLN: Freq: Once | INTRAVENOUS | Status: DC

## 2015-04-05 NOTE — Care Management Important Message (Signed)
Important Message  Patient Details  Name: Emily Sanchez MRN: [id number] Date of Birth: 11-May-1955   Medicare Important Message Given:  Yes    Camillo Flaming 04/05/2015, 9:06 AMImportant Message  Patient Details  Name: Emily Sanchez MRN: [id number] Date of Birth: 10/05/1955   Medicare Important Message Given:  Yes    Camillo Flaming 04/05/2015, 9:06 AM

## 2015-04-05 NOTE — Progress Notes (Signed)
Patient ID: Emily Sanchez, female   DOB: [birth year], 59 y.o.   MRN: [id number] [facility] Surgery Progress Note:   3 Days Post-Op  Subjective: Mental status is clear.  Sitiing up this afternoon feeling much better after 1 unit pRBC Objective: Vital signs in last 24 hours: Temp:  [98.2 F (36.8 C)-99.4 F (37.4 C)] 98.4 F (36.9 C) (12/16 1815) Pulse Rate:  [99-121] 105 (12/16 1815) Resp:  [18] 18 (12/16 1815) BP: (130-152)/(56-81) 150/74 mmHg (12/16 1815) SpO2:  [97 %-100 %] 99 % (12/16 1815)  Intake/Output from previous day: 12/15 0701 - 12/16 0700 In: 2546.7 [P.O.:480; I.V.:2066.7] Out: 2075 [Urine:1625; Stool:450] Intake/Output this shift:    Physical Exam: Work of breathing is not elevated.  Incisions OK.  Ileostomy putting out dark material.    Lab Results:  Results for orders placed or performed during the hospital encounter of 04/02/15 (from the past 48 hour(s))  CBC with Differential     Status: Abnormal   Collection Time: 04/04/15  5:30 AM  Result Value Ref Range   WBC 13.2 (H) 4.0 - 10.5 K/uL   RBC 2.83 (L) 3.87 - 5.11 MIL/uL   Hemoglobin 8.1 (L) 12.0 - 15.0 g/dL   HCT 24.8 (L) 36.0 - 46.0 %   MCV 87.6 78.0 - 100.0 fL   MCH 28.6 26.0 - 34.0 pg   MCHC 32.7 30.0 - 36.0 g/dL   RDW 15.4 11.5 - 15.5 %   Platelets 268 150 - 400 K/uL   Neutrophils Relative % 68 %   Neutro Abs 8.9 (H) 1.7 - 7.7 K/uL   Lymphocytes Relative 20 %   Lymphs Abs 2.7 0.7 - 4.0 K/uL   Monocytes Relative 12 %   Monocytes Absolute 1.6 (H) 0.1 - 1.0 K/uL   Eosinophils Relative 0 %   Eosinophils Absolute 0.0 0.0 - 0.7 K/uL   Basophils Relative 0 %   Basophils Absolute 0.0 0.0 - 0.1 K/uL  CBC with Differential/Platelet     Status: Abnormal   Collection Time: 04/04/15 11:24 AM  Result Value Ref Range   WBC 12.5 (H) 4.0 - 10.5 K/uL   RBC 2.75 (L) 3.87 - 5.11 MIL/uL   Hemoglobin 7.8 (L) 12.0 - 15.0 g/dL   HCT 24.1 (L) 36.0 - 46.0 %   MCV 87.6 78.0 - 100.0 fL   MCH 28.4 26.0 - 34.0 pg   MCHC 32.4 30.0 - 36.0 g/dL   RDW 15.6 (H) 11.5 - 15.5 %   Platelets 251 150 - 400 K/uL   Neutrophils Relative % 68 %   Neutro Abs 8.5 (H) 1.7 - 7.7 K/uL   Lymphocytes Relative 20 %   Lymphs Abs 2.5 0.7 - 4.0 K/uL   Monocytes Relative 12 %   Monocytes Absolute 1.5 (H) 0.1 - 1.0 K/uL   Eosinophils Relative 0 %   Eosinophils Absolute 0.0 0.0 - 0.7 K/uL   Basophils Relative 0 %   Basophils Absolute 0.0 0.0 - 0.1 K/uL  Type and screen [place]     Status: None (Preliminary result)   Collection Time: 04/04/15 11:24 AM  Result Value Ref Range   ABO/RH(D) O POS    Antibody Screen NEG    Sample Expiration 04/07/2015    Unit Number [id number]    Blood Component Type RED CELLS,LR    Unit division 00    Status of Unit ISSUED    Transfusion Status OK TO TRANSFUSE    Crossmatch Result Compatible   ABO/Rh  Status: None   Collection Time: 04/04/15 11:30 AM  Result Value Ref Range   ABO/RH(D) O POS   Protime-INR     Status: Abnormal   Collection Time: 04/04/15  3:30 PM  Result Value Ref Range   Prothrombin Time 15.3 (H) 11.6 - 15.2 seconds   INR 1.20 0.00 - 1.49  APTT     Status: None   Collection Time: 04/04/15  3:30 PM  Result Value Ref Range   aPTT 27 24 - 37 seconds  CBC     Status: Abnormal   Collection Time: 04/04/15  3:30 PM  Result Value Ref Range   WBC 14.4 (H) 4.0 - 10.5 K/uL   RBC 2.77 (L) 3.87 - 5.11 MIL/uL   Hemoglobin 7.9 (L) 12.0 - 15.0 g/dL   HCT 24.5 (L) 36.0 - 46.0 %   MCV 88.4 78.0 - 100.0 fL   MCH 28.5 26.0 - 34.0 pg   MCHC 32.2 30.0 - 36.0 g/dL   RDW 15.6 (H) 11.5 - 15.5 %   Platelets 249 150 - 400 K/uL  Basic metabolic panel     Status: Abnormal   Collection Time: 04/04/15  3:30 PM  Result Value Ref Range   Sodium 138 135 - 145 mmol/L   Potassium 4.5 3.5 - 5.1 mmol/L   Chloride 107 101 - 111 mmol/L   CO2 21 (L) 22 - 32 mmol/L   Glucose, Bld 105 (H) 65 - 99 mg/dL   BUN 22 (H) 6 - 20 mg/dL   Creatinine, Ser 1.42 (H) 0.44 - 1.00  mg/dL   Calcium 8.7 (L) 8.9 - 10.3 mg/dL   GFR calc non Af Amer 40 (L) >60 mL/min   GFR calc Af Amer 46 (L) >60 mL/min    Comment: (NOTE) The eGFR has been calculated using the CKD EPI equation. This calculation has not been validated in all clinical situations. eGFR's persistently <60 mL/min signify possible Chronic Kidney Disease.    Anion gap 10 5 - 15  Comprehensive metabolic panel     Status: Abnormal   Collection Time: 04/05/15  4:20 AM  Result Value Ref Range   Sodium 138 135 - 145 mmol/L   Potassium 4.2 3.5 - 5.1 mmol/L   Chloride 106 101 - 111 mmol/L   CO2 25 22 - 32 mmol/L   Glucose, Bld 125 (H) 65 - 99 mg/dL   BUN 19 6 - 20 mg/dL   Creatinine, Ser 1.18 (H) 0.44 - 1.00 mg/dL   Calcium 8.6 (L) 8.9 - 10.3 mg/dL   Total Protein 5.9 (L) 6.5 - 8.1 g/dL   Albumin 2.9 (L) 3.5 - 5.0 g/dL   AST 14 (L) 15 - 41 U/L   ALT 12 (L) 14 - 54 U/L   Alkaline Phosphatase 48 38 - 126 U/L   Total Bilirubin 0.6 0.3 - 1.2 mg/dL   GFR calc non Af Amer 49 (L) >60 mL/min   GFR calc Af Amer 57 (L) >60 mL/min    Comment: (NOTE) The eGFR has been calculated using the CKD EPI equation. This calculation has not been validated in all clinical situations. eGFR's persistently <60 mL/min signify possible Chronic Kidney Disease.    Anion gap 7 5 - 15  CBC with Differential/Platelet     Status: Abnormal   Collection Time: 04/05/15  4:20 AM  Result Value Ref Range   WBC 12.2 (H) 4.0 - 10.5 K/uL   RBC 2.38 (L) 3.87 - 5.11 MIL/uL   Hemoglobin 6.8 (  LL) 12.0 - 15.0 g/dL    Comment: CRITICAL RESULT CALLED TO, READ BACK BY AND VERIFIED WITH: P. PARROT RN AT 0445 ON 12.16.16 BY SHUEA    HCT 21.0 (L) 36.0 - 46.0 %   MCV 88.2 78.0 - 100.0 fL   MCH 28.6 26.0 - 34.0 pg   MCHC 32.4 30.0 - 36.0 g/dL   RDW 15.7 (H) 11.5 - 15.5 %   Platelets 219 150 - 400 K/uL   Neutrophils Relative % 60 %   Neutro Abs 7.4 1.7 - 7.7 K/uL   Lymphocytes Relative 27 %   Lymphs Abs 3.3 0.7 - 4.0 K/uL   Monocytes Relative 12  %   Monocytes Absolute 1.5 (H) 0.1 - 1.0 K/uL   Eosinophils Relative 0 %   Eosinophils Absolute 0.0 0.0 - 0.7 K/uL   Basophils Relative 0 %   Basophils Absolute 0.0 0.0 - 0.1 K/uL  Prepare RBC     Status: None   Collection Time: 04/05/15  5:53 AM  Result Value Ref Range   Order Confirmation ORDER PROCESSED BY BLOOD BANK     Radiology/Results: No results found.  Anti-infectives: Anti-infectives    Start     Dose/Rate Route Frequency Ordered Stop   04/02/15 1041  cefOXitin (MEFOXIN) 2 g in dextrose 5 % 50 mL IVPB     2 g 100 mL/hr over 30 Minutes Intravenous On call to O.R. 04/02/15 1041 04/02/15 1725      Assessment/Plan: Problem List: Patient Active Problem List   Diagnosis Date Noted  . 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 Status post total colectomy and ileostomy for Crohn's 03/21/2015  . Ventral hernia 03/21/2015  . Well adult exam 02/27/2015  . Allergic rhinitis 08/16/2014  . Elevated serum creatinine 10/17/2012  . ARF (acute renal failure) ([place]) 08/16/2012  . Dehydration 08/16/2012  . Enteritis due to Clostridium difficile 08/16/2012  . Nausea and vomiting 08/16/2012  . Angular stomatitis 03/30/2012  . Anemia, chronic disease 04/08/2011  . CRI (chronic renal insufficiency) 04/08/2011  . GRIEF REACTION 10/23/2009  . Osteoarthritis 06/18/2009  . KNEE PAIN 06/18/2009  . RUQ PAIN 02/14/2009  . PNEUMONIA 11/06/2008  . COUGH 11/06/2008  . FIBROMYALGIA 08/13/2008  . OBESITY, MORBID 01/20/2008  . INSOMNIA, PERSISTENT 10/18/2007  . FATIGUE 10/18/2007  . LEG PAIN 09/16/2007  . VITAMIN D DEFICIENCY 07/15/2007  . WEIGHT GAIN 07/15/2007  . Acute upper respiratory infection 04/26/2007  . Anxiety state 02/17/2007  . GERD 02/17/2007  . LOW BACK PAIN 02/17/2007  . B12 deficiency 11/08/2006  . PANIC ATTACK 11/08/2006  . DEPRESSION 11/08/2006  . Essential hypertension 11/08/2006  . Crohn's disease ([place]) 11/08/2006    Probable staple line bleed.  Will  check CBC in am and assess about discharge.   3 Days Post-Op    LOS: 3 days   Matt B. Hassell Done, MD, [facility] [phone] beeper [phone]  04/05/2015 8:25 PM

## 2015-04-06 LAB — CBC WITH DIFFERENTIAL/PLATELET
BASOS PCT: 0 %
Basophils Absolute: 0 10*3/uL (ref 0.0–0.1)
EOS ABS: 0.2 10*3/uL (ref 0.0–0.7)
Eosinophils Relative: 2 %
HEMATOCRIT: 21.4 % — AB (ref 36.0–46.0)
HEMOGLOBIN: 7 g/dL — AB (ref 12.0–15.0)
LYMPHS ABS: 3.3 10*3/uL (ref 0.7–4.0)
Lymphocytes Relative: 28 %
MCH: 29 pg (ref 26.0–34.0)
MCHC: 32.7 g/dL (ref 30.0–36.0)
MCV: 88.8 fL (ref 78.0–100.0)
MONO ABS: 1 10*3/uL (ref 0.1–1.0)
MONOS PCT: 9 %
Neutro Abs: 7.2 10*3/uL (ref 1.7–7.7)
Neutrophils Relative %: 61 %
Platelets: 227 10*3/uL (ref 150–400)
RBC: 2.41 MIL/uL — ABNORMAL LOW (ref 3.87–5.11)
RDW: 15.5 % (ref 11.5–15.5)
WBC: 11.7 10*3/uL — ABNORMAL HIGH (ref 4.0–10.5)

## 2015-04-06 LAB — TYPE AND SCREEN
ABO/RH(D): O POS
ANTIBODY SCREEN: NEGATIVE
UNIT DIVISION: 0

## 2015-04-06 NOTE — Progress Notes (Signed)
4 Days Post-Op  Subjective: Complains of some soreness. Hg stable overnight. No nausea  Objective: Vital signs in last 24 hours: Temp:  [98.2 F (36.8 C)-98.7 F (37.1 C)] 98.4 F (36.9 C) (12/17 0610) Pulse Rate:  [98-105] 98 (12/17 0610) Resp:  [14-18] 14 (12/17 0610) BP: (138-152)/(65-81) 141/65 mmHg (12/17 0610) SpO2:  [98 %-100 %] 98 % (12/17 0610) Last BM Date: 04/05/15  Intake/Output from previous day: 12/16 0701 - 12/17 0700 In: 2815 [P.O.:480; I.V.:2000; Blood:335] Out: 2225 [Urine:2125; Stool:100] Intake/Output this shift:    Resp: clear to auscultation bilaterally Cardio: regular rate and rhythm GI: soft, mild to moderate tenderness. wound clean  Lab Results:   Recent Labs  04/05/15 0420 04/06/15 0456  WBC 12.2* 11.7*  HGB 6.8* 7.0*  HCT 21.0* 21.4*  PLT 219 227   BMET  Recent Labs  04/04/15 1530 04/05/15 0420  NA 138 138  K 4.5 4.2  CL 107 106  CO2 21* 25  GLUCOSE 105* 125*  BUN 22* 19  CREATININE 1.42* 1.18*  CALCIUM 8.7* 8.6*   PT/INR  Recent Labs  04/04/15 1530  LABPROT 15.3*  INR 1.20   ABG No results for input(s): PHART, HCO3 in the last 72 hours.  Invalid input(s): PCO2, PO2  Studies/Results: No results found.  Anti-infectives: Anti-infectives    Start     Dose/Rate Route Frequency Ordered Stop   04/02/15 1041  cefOXitin (MEFOXIN) 2 g in dextrose 5 % 50 mL IVPB     2 g 100 mL/hr over 30 Minutes Intravenous On call to O.R. 04/02/15 1041 04/02/15 1725      Assessment/Plan: s/p Procedure(s): LAPAROSCOPIC LYSIS OF ADHESIONS GASTRIC SLEEVE RESECTION WITH OPEN VENTRAL HERNIA REPAIR (N/A) UPPER GI ENDOSCOPY LAPAROSCOPIC LYSIS OF ADHESIONS HERNIA REPAIR VENTRAL ADULT Advance diet  Monitor hg ambulate  LOS: 4 days    TOTH III,PAUL S 04/06/2015

## 2015-04-07 LAB — CBC WITH DIFFERENTIAL/PLATELET
BASOS ABS: 0 10*3/uL (ref 0.0–0.1)
Basophils Relative: 0 %
Eosinophils Absolute: 0.3 10*3/uL (ref 0.0–0.7)
Eosinophils Relative: 3 %
HEMATOCRIT: 21.9 % — AB (ref 36.0–46.0)
HEMOGLOBIN: 7.1 g/dL — AB (ref 12.0–15.0)
LYMPHS PCT: 19 %
Lymphs Abs: 1.9 10*3/uL (ref 0.7–4.0)
MCH: 28.5 pg (ref 26.0–34.0)
MCHC: 32.4 g/dL (ref 30.0–36.0)
MCV: 88 fL (ref 78.0–100.0)
Monocytes Absolute: 0.8 10*3/uL (ref 0.1–1.0)
Monocytes Relative: 8 %
NEUTROS ABS: 6.9 10*3/uL (ref 1.7–7.7)
NEUTROS PCT: 70 %
PLATELETS: 262 10*3/uL (ref 150–400)
RBC: 2.49 MIL/uL — AB (ref 3.87–5.11)
RDW: 15.3 % (ref 11.5–15.5)
WBC: 9.9 10*3/uL (ref 4.0–10.5)

## 2015-04-07 NOTE — Progress Notes (Signed)
Pt's IV accidentally came out.  May be going home today, only has maintenace fluid running. Will hold off restarting for now.

## 2015-04-07 NOTE — Progress Notes (Signed)
General Surgery Note  LOS: 5 days  POD -  5 Days Post-Op  Assessment/Plan: 1.  LAPAROSCOPIC LYSIS OF ADHESIONS GASTRIC SLEEVE RESECTION WITH OPEN VENTRAL HERNIA REPAIR, UPPER GI ENDOSCOPY, LAPAROSCOPIC LYSIS OF ADHESIONS, HERNIA REPAIR VENTRAL ADULT - 04/02/2015 - M. Hassell Done  For morbid obesity and abdominal wall hernia  Sore, but doing okay.  Has Provena wound suction on.  2.  Post op bleed  Hgb - 7.1 - 04/07/2015  Repeat labs tomorrow  Because of bleed, will keep at least until tomorrow. 3.  HTN 4.  Chronic low back pain 5.  History of colectomy - ostomy in RLQ - [facility] ([facility]) 6.  DVT prophylaxis - on hold secondary to bleed   Active Problems:   S/P laparoscopic sleeve gastrectomy  Subjective:  Doing okay, except sore. Objective:   Filed Vitals:   04/07/15 0149 04/07/15 0541  BP: 150/69 146/87  Pulse: 103 1  Temp: 98.8 F (37.1 C) 98.8 F (37.1 C)  Resp: 24 20     Intake/Output from previous day:  12/17 0701 - 12/18 0700 In: 2400 [I.V.:2400] Out: 1800 [Urine:1800]  Intake/Output this shift:      Physical Exam:   General: Obese AA F who is alert and oriented.    HEENT: Normal. Pupils equal. .   Lungs: clear   Abdomen: Soft.  Rare BS.   Wound: Provena wound application on wound.  Ostomy in RLQ.     Lab Results:    Recent Labs  04/06/15 0456 04/07/15 0802  WBC 11.7* 9.9  HGB 7.0* 7.1*  HCT 21.4* 21.9*  PLT 227 262    BMET   Recent Labs  04/04/15 1530 04/05/15 0420  NA 138 138  K 4.5 4.2  CL 107 106  CO2 21* 25  GLUCOSE 105* 125*  BUN 22* 19  CREATININE 1.42* 1.18*  CALCIUM 8.7* 8.6*    PT/INR   Recent Labs  04/04/15 1530  LABPROT 15.3*  INR 1.20    ABG  No results for input(s): PHART, HCO3 in the last 72 hours.  Invalid input(s): PCO2, PO2   Studies/Results:  No results found.   Anti-infectives:   Anti-infectives    Start     Dose/Rate Route Frequency Ordered Stop   04/02/15 1041  cefOXitin (MEFOXIN) 2 g in dextrose 5 % 50  mL IVPB     2 g 100 mL/hr over 30 Minutes Intravenous On call to O.R. 04/02/15 1041 04/02/15 1725      Alphonsa Overall, MD, FACS Pager: [facility] Office: [phone] 04/07/2015

## 2015-04-08 ENCOUNTER — Telehealth (HOSPITAL_COMMUNITY): Payer: Self-pay

## 2015-04-08 LAB — CBC WITH DIFFERENTIAL/PLATELET
BASOS ABS: 0 10*3/uL (ref 0.0–0.1)
Basophils Relative: 0 %
EOS PCT: 3 %
Eosinophils Absolute: 0.3 10*3/uL (ref 0.0–0.7)
HEMATOCRIT: 23.1 % — AB (ref 36.0–46.0)
HEMOGLOBIN: 7.7 g/dL — AB (ref 12.0–15.0)
LYMPHS ABS: 2.6 10*3/uL (ref 0.7–4.0)
LYMPHS PCT: 21 %
MCH: 28.9 pg (ref 26.0–34.0)
MCHC: 33.3 g/dL (ref 30.0–36.0)
MCV: 86.8 fL (ref 78.0–100.0)
Monocytes Absolute: 1.1 10*3/uL — ABNORMAL HIGH (ref 0.1–1.0)
Monocytes Relative: 9 %
Neutro Abs: 8.1 10*3/uL — ABNORMAL HIGH (ref 1.7–7.7)
Neutrophils Relative %: 67 %
PLATELETS: 291 10*3/uL (ref 150–400)
RBC: 2.66 MIL/uL — AB (ref 3.87–5.11)
RDW: 15.4 % (ref 11.5–15.5)
WBC: 12.1 10*3/uL — ABNORMAL HIGH (ref 4.0–10.5)

## 2015-04-08 NOTE — Telephone Encounter (Signed)
Made discharge phone call to patient per DROP protocol. Asking the following questions.    1. Do you have someone to care for you now that you are home?  yes 2. Are you having pain now that is not relieved by your pain medication?  no 3. Are you able to drink the recommended daily amount of fluids (48 ounces minimum/day) and protein (60-80 grams/day) as prescribed by the dietitian or nutritional counselor?  yes 4. Are you taking the vitamins and minerals as prescribed?  yes 5. Do you have the "on call" number to contact your surgeon if you have a problem or question?  yes 6. Are your incisions free of redness, swelling or drainage? (If steri strips, address that these can fall off, shower as tolerated) yes 7. Have your bowels moved since your surgery?  If not, are you passing gas?  yes 8. Are you up and walking 3-4 times per day? yes

## 2015-04-08 NOTE — Discharge Summary (Signed)
Physician Discharge Summary  Patient ID: Emily Sanchez MRN: [id number] DOB/AGE: July 19, 1955 59 y.o.  Admit date: 04/02/2015 Discharge date: 04/08/2015  Admission Diagnoses:  Morbid obesity with prior total proctocolectomy and multiple abdominal wall herniae  Discharge Diagnoses:  same  Active Problems:   S/P laparoscopic sleeve gastrectomy Dec 2016   Surgery:  Laparoscopic takedown of incarcerated ventral hernia, sleeve gastrectomy and primary closure of ventral hernia in upper midline  Discharged Condition: improved  Hospital Course:   Had surgery last Monday afternoon.  Kept in stepdown overnight.  Had evidence of slow bleeding postop requiring 1 unit of pRBCs.  Diet advanced;  Heparin held.  Ready for discharge on Monday, 12/19.  Prevena incisional vac removed from transverse incision at site of hernia repair prior to discharge.  Hg above 7 at time of discharge and had been stable > 24 hrs.    Consults: none  Significant Diagnostic Studies: CBCs    Discharge Exam: Blood pressure 126/62, pulse 99, temperature 98.1 F (36.7 C), temperature source Oral, resp. rate 20, height 5' 4.5" (1.638 m), weight 124.4 kg (274 lb 4 oz), SpO2 99 %. Incisions healing without redness.  Prevena removed.    Disposition: 01-Home or Self Care  Discharge Instructions    Ambulate hourly while awake    Complete by:  As directed      Call MD for:  difficulty breathing, headache or visual disturbances    Complete by:  As directed      Call MD for:  persistant dizziness or light-headedness    Complete by:  As directed      Call MD for:  persistant nausea and vomiting    Complete by:  As directed      Call MD for:  redness, tenderness, or signs of infection (pain, swelling, redness, odor or green/yellow discharge around incision site)    Complete by:  As directed      Call MD for:  severe uncontrolled pain    Complete by:  As directed      Call MD for:  temperature >101 F    Complete by:  As  directed      Diet bariatric full liquid    Complete by:  As directed      Incentive spirometry    Complete by:  As directed   Perform hourly while awake            Medication List    TAKE these medications        cyanocobalamin 1000 MCG/ML injection  Commonly known as:  (VITAMIN B-12)  Inject 1 mL (1,000 mcg total) into the muscle every 30 (thirty) days.     cyclobenzaprine 5 MG tablet  Commonly known as:  FLEXERIL  TAKE 1-2 TABLETS BY MOUTH AT BEDTIME AND MAY REPEAT DOSE ONE TIME IF NEEDED     hydrochlorothiazide 12.5 MG capsule  Commonly known as:  MICROZIDE  Take 1 capsule (12.5 mg total) by mouth daily.  Notes to Patient:  Monitor Blood Pressure Daily and keep a log for primary care physician.  Monitor for symptoms of dehydration.  You may need to make changes to your medications with rapid weight loss.        multivitamin with minerals Tabs tablet  Take 1 tablet by mouth daily.     ondansetron 4 MG disintegrating tablet  Commonly known as:  ZOFRAN-ODT  Take 4 mg by mouth every 6 (six) hours as needed. nausea     oxyCODONE 15 MG  immediate release tablet  Commonly known as:  ROXICODONE  Take 1 tablet (15 mg total) by mouth 4 (four) times daily. Please fill on or after  04/29/15     oxyCODONE 5 MG/5ML solution  Commonly known as:  ROXICODONE  Take 5-10 mg by mouth every 4 (four) hours as needed. pain     pantoprazole 40 MG tablet  Commonly known as:  PROTONIX  Take 40 mg by mouth daily.     SYRINGE-NEEDLE (DISP) 3 ML 23G X 1" 3 ML Misc  Commonly known as:  B-D INTEGRA SYRINGE  1 Device by Does not apply route every 30 (thirty) days. For B12 shots           Follow-up Information    Follow up with Pedro Earls, MD. Go on 04/17/2015.   Specialty:  General Surgery   Why:  For Post-Op Check at 10:00   Contact information:   [address] [place] [address] [phone]       Follow up with Pedro Earls, MD. Go on 05/16/2015.    Specialty:  General Surgery   Why:  For Post-Op Check 10:45   Contact information:   [place]  [id number] [phone]       Signed: Pedro Earls 04/08/2015, 10:09 AM

## 2015-04-08 NOTE — Clinical Documentation Improvement (Signed)
General Surgery  Can the diagnosis of blood loss anemia be further specified?   Acute Blood Loss Anemia, including the suspected or known cause or associated condition(s)  Acute on chronic blood loss anemia, including the suspected or known cause or associated condition(s)  Chronic blood loss anemia, including the suspected or known cause or associated condition(s)  Precipitous drop in Hematocrit, including the suspected or known cause or associated condition(s)  Other  Clinically Undetermined  Document any associated diagnoses/conditions.  Please update your documentation within the medical record to reflect your response to this query. Thank you.  Supporting Information:(As per notes)"Hg drift downward likely blood loss related to surgery"  Treatment: pt transfused RBCs  Labs Component     Latest Ref Rng 04/02/2015 04/02/2015 04/03/2015 04/03/2015         6:20 PM  9:43 PM  3:43 AM  4:06 PM  Hemoglobin     12.0 - 15.0 g/dL 11.5 (L) 12.9 10.3 (L) 9.8 (L)  HCT     36.0 - 46.0 % 36.0 38.1 31.3 (L) 30.4 (L)   Component     Latest Ref Rng 04/04/2015 04/04/2015 04/04/2015 04/05/2015         5:30 AM 11:24 AM  3:30 PM   Hemoglobin     12.0 - 15.0 g/dL 8.1 (L) 7.8 (L) 7.9 (L) 6.8 (LL)  HCT     36.0 - 46.0 % 24.8 (L) 24.1 (L) 24.5 (L) 21.0 (L)   Component     Latest Ref Rng 04/06/2015 04/07/2015 04/08/2015            Hemoglobin     12.0 - 15.0 g/dL 7.0 (L) 7.1 (L) 7.7 (L)  HCT     36.0 - 46.0 % 21.4 (L) 21.9 (L) 23.1 (L)   Please exercise your independent, professional judgment when responding. A specific answer is not anticipated or expected.  Thank You, Alessandra Grout, RN, BSN, CCDS,Clinical Documentation Specialist:  [phone]  [phone]=[employer]- Health Information Management

## 2015-04-08 NOTE — Progress Notes (Signed)
Pt offered liquid roxicode for her pain, but refused. She stated that it didn't help her pain like morphine did.  Pt aware she can;t go home on IV pain meds, but insists on them anyway.  Will continue to monitor.

## 2015-04-16 ENCOUNTER — Encounter: Payer: Medicare Other | Attending: Surgery

## 2015-04-16 DIAGNOSIS — Z713 Dietary counseling and surveillance: Secondary | ICD-10-CM | POA: Insufficient documentation

## 2015-04-16 DIAGNOSIS — Z6841 Body Mass Index (BMI) 40.0 and over, adult: Secondary | ICD-10-CM | POA: Insufficient documentation

## 2015-04-17 NOTE — Progress Notes (Signed)
Bariatric Class:  Appt start time: 1530 end time:  1630.  2 Week Post-Operative Nutrition Class  Patient was seen on 04/16/2015 for Post-Operative Nutrition education at [facility].   Surgery date: 04/05/15 Surgery type: sleeve Start weight at [facility]: 265 lbs on 11/13/2013 Weight today: 250.5 lbs Weight change: 16.5 lbs  TANITA  BODY COMP RESULTS  03/11/15 04/16/15   BMI (kg/m^2) NA 43   Fat Mass (lbs)  139.5   Fat Free Mass (lbs)  111   Total Body Water (lbs)  81.5    The following the learning objectives were met by the patient during this course:  Identifies Phase 3A (Soft, High Proteins) Dietary Goals and will begin from 2 weeks post-operatively to 2 months post-operatively  Identifies appropriate sources of fluids and proteins   States protein recommendations and appropriate sources post-operatively  Identifies the need for appropriate texture modifications, mastication, and bite sizes when consuming solids  Identifies appropriate multivitamin and calcium sources post-operatively  Describes the need for physical activity post-operatively and will follow MD recommendations  States when to call healthcare provider regarding medication questions or post-operative complications  Handouts given during class include:  Phase 3A: Soft, High Protein Diet Handout  Band Fill Guidelines Handout  Follow-Up Plan: Patient will follow-up at [facility] in 4 weeks for 6 week post-op nutrition visit for diet advancement per MD.

## 2015-05-27 DIAGNOSIS — M170 Bilateral primary osteoarthritis of knee: Secondary | ICD-10-CM | POA: Diagnosis not present

## 2015-05-27 DIAGNOSIS — M25561 Pain in right knee: Secondary | ICD-10-CM | POA: Diagnosis not present

## 2015-05-27 DIAGNOSIS — M1711 Unilateral primary osteoarthritis, right knee: Secondary | ICD-10-CM | POA: Diagnosis not present

## 2015-05-27 DIAGNOSIS — M1712 Unilateral primary osteoarthritis, left knee: Secondary | ICD-10-CM | POA: Diagnosis not present

## 2015-05-27 DIAGNOSIS — M25562 Pain in left knee: Secondary | ICD-10-CM | POA: Diagnosis not present

## 2015-05-29 ENCOUNTER — Encounter: Payer: Self-pay | Admitting: Dietician

## 2015-05-29 ENCOUNTER — Encounter: Payer: Medicare Other | Attending: Surgery | Admitting: Dietician

## 2015-05-29 DIAGNOSIS — Z713 Dietary counseling and surveillance: Secondary | ICD-10-CM | POA: Diagnosis not present

## 2015-05-29 DIAGNOSIS — Z6841 Body Mass Index (BMI) 40.0 and over, adult: Secondary | ICD-10-CM | POA: Diagnosis not present

## 2015-05-29 NOTE — Progress Notes (Signed)
  Follow-up visit:  8 Weeks Post-Operative Sleeve gastrectomy Surgery  Medical Nutrition Therapy:  Appt start time: 1110 end time:  1135  Primary concerns today: Post-operative Bariatric Surgery Nutrition Management. Jaymie returns having lost another 10 pounds. She reports concern that she may not be eating enough but feeling satisfied with intake overall; meeting fluid and protein needs. Has Crohn's, colitis, and an ostomy bag. Has some nausea and vomiting but reports her bowel movements are normal. Tried raw carrots and tolerating.   Surgery date: 04/05/15 Surgery type: sleeve Start weight at [facility]: 265 lbs on 11/13/2013 (highest weight 309 lbs per patient) Weight today: 240.5 lbs Weight change: 10 lbs Total weight lost: 68.5 lbs  Weight loss goal: 150 lbs  TANITA  BODY COMP RESULTS  03/11/15 04/16/15 05/29/15   BMI (kg/m^2) NA 43 41.3   Fat Mass (lbs)  139.5 119.5   Fat Free Mass (lbs)  111 121   Total Body Water (lbs)  81.5 88.5    Preferred Learning Style:   No preference indicated   Learning Readiness:   Ready  24-hr recall: B (AM): Premier protein shake (30g) Snk (AM):   L (PM): [place] yogurt (12g) Snk (PM): carrots and hummus (2g)  D (PM): 2 oz baked chicken (14g) Snk (PM): sometimes jello or popsicle  Fluid intake: 64 oz water per patient Estimated total protein intake: 58 g/day  Medications: see list Supplementation: taking  Using straws: no Drinking while eating: sometimes forgets and sips (causes pain) Hair loss: no Carbonated beverages: tried sips of [place] N/V/D/C: nausea and vomiting (has Crohn's and colitis) Dumping syndrome: none  Recent physical activity:  Walking (has stopped lately due to fall)  Progress Towards Goal(s):  In progress.  Handouts given during visit include:  Phase 3B lean protein + non starchy vegetables   Nutritional Diagnosis:  [place]-3.3 Overweight/obesity related to past poor dietary habits and physical inactivity as  evidenced by patient w/ recent sleeve gastrectomy surgery following dietary guidelines for continued weight loss.     Intervention:  Nutrition counseling provided.  Teaching Method Utilized:  Visual Auditory Hands on  Barriers to learning/adherence to lifestyle change: none  Demonstrated degree of understanding via:  Teach Back   Monitoring/Evaluation:  Dietary intake, exercise, and body weight. Follow up in 6 months for 8 month post-op visit.

## 2015-05-29 NOTE — Patient Instructions (Signed)
Goals:  Follow Phase 3B: High Protein + Non-Starchy Vegetables  Eat 3-6 small meals/snacks, every 3-5 hrs  Increase lean protein foods to meet 60g goal  Increase fluid intake to 64oz +  Avoid drinking 15 minutes before, during and 30 minutes after eating  Aim for >30 min of physical activity daily  Surgery date: 04/05/15 Surgery type: sleeve Start weight at [facility]: 265 lbs on 11/13/2013 (309 lbs) Weight today: 240.5 lbs Weight change: 10 lbs Total weight lost: 68.5 lbs  Weight loss goal: 150 lbs  TANITA  BODY COMP RESULTS  03/11/15 04/16/15 05/29/15   BMI (kg/m^2) NA 43 41.3   Fat Mass (lbs)  139.5 119.5   Fat Free Mass (lbs)  111 121   Total Body Water (lbs)  81.5 88.5

## 2015-06-05 ENCOUNTER — Ambulatory Visit (INDEPENDENT_AMBULATORY_CARE_PROVIDER_SITE_OTHER): Payer: Medicare Other | Admitting: Internal Medicine

## 2015-06-05 ENCOUNTER — Encounter: Payer: Self-pay | Admitting: Internal Medicine

## 2015-06-05 VITALS — BP 100/74 | HR 84 | Wt 239.0 lb

## 2015-06-05 DIAGNOSIS — K219 Gastro-esophageal reflux disease without esophagitis: Secondary | ICD-10-CM | POA: Diagnosis not present

## 2015-06-05 DIAGNOSIS — I10 Essential (primary) hypertension: Secondary | ICD-10-CM

## 2015-06-05 DIAGNOSIS — M5440 Lumbago with sciatica, unspecified side: Secondary | ICD-10-CM

## 2015-06-05 DIAGNOSIS — G8929 Other chronic pain: Secondary | ICD-10-CM

## 2015-06-05 DIAGNOSIS — Z9884 Bariatric surgery status: Secondary | ICD-10-CM

## 2015-06-05 DIAGNOSIS — E538 Deficiency of other specified B group vitamins: Secondary | ICD-10-CM

## 2015-06-05 MED ORDER — OXYCODONE HCL 15 MG PO TABS: 15.0000 mg | ORAL_TABLET | Freq: Four times a day (QID) | ORAL | Status: DC

## 2015-06-05 MED ORDER — OXYCODONE HCL 15 MG PO TABS: 7.5000 mg | ORAL_TABLET | Freq: Four times a day (QID) | ORAL | Status: DC | PRN

## 2015-06-05 MED ORDER — CYANOCOBALAMIN 1000 MCG/ML IJ SOLN: 1000.0000 ug | INTRAMUSCULAR | Status: DC

## 2015-06-05 NOTE — Progress Notes (Signed)
Subjective:  Patient ID: Emily Sanchez, female    DOB: 1955-10-07  Age: 60 y.o. MRN: [id number]  CC: No chief complaint on file.   HPI Emily Sanchez presents for HTN, LBP, knee OA f/u. Pt lost 45 lbs  Outpatient Prescriptions Prior to Visit  Medication Sig Dispense Refill  . cyclobenzaprine (FLEXERIL) 5 MG tablet TAKE 1-2 TABLETS BY MOUTH AT BEDTIME AND MAY REPEAT DOSE ONE TIME IF NEEDED 60 tablet 2  . hydrochlorothiazide (MICROZIDE) 12.5 MG capsule Take 1 capsule (12.5 mg total) by mouth daily. 90 capsule 3  . Multiple Vitamin (MULTIVITAMIN WITH MINERALS) TABS tablet Take 1 tablet by mouth daily.    . pantoprazole (PROTONIX) 40 MG tablet Take 40 mg by mouth daily.  3  . SYRINGE-NEEDLE, DISP, 3 ML (B-D INTEGRA SYRINGE) 23G X 1" 3 ML MISC 1 Device by Does not apply route every 30 (thirty) days. For B12 shots 50 each 3  . cyanocobalamin (,VITAMIN B-12,) 1000 MCG/ML injection Inject 1 mL (1,000 mcg total) into the muscle every 30 (thirty) days. 10 mL 11  . oxyCODONE (ROXICODONE) 15 MG immediate release tablet Take 1 tablet (15 mg total) by mouth 4 (four) times daily. Please fill on or after  04/29/15 120 tablet 0  . ondansetron (ZOFRAN-ODT) 4 MG disintegrating tablet Take 4 mg by mouth every 6 (six) hours as needed. Reported on 06/05/2015  0  . oxyCODONE (ROXICODONE) 5 MG/5ML solution Take 5-10 mg by mouth every 4 (four) hours as needed. Reported on 06/05/2015  0   No facility-administered medications prior to visit.    ROS Review of Systems  Constitutional: Positive for fatigue. Negative for chills, activity change, appetite change and unexpected weight change.  HENT: Negative for congestion, mouth sores and sinus pressure.   Eyes: Negative for visual disturbance.  Respiratory: Negative for cough and chest tightness.   Gastrointestinal: Negative for nausea, vomiting and abdominal pain.  Genitourinary: Negative for frequency, difficulty urinating and vaginal pain.  Musculoskeletal:  Positive for back pain, arthralgias and neck stiffness. Negative for gait problem.  Skin: Negative for pallor and rash.  Neurological: Negative for dizziness, tremors, weakness, numbness and headaches.  Psychiatric/Behavioral: Negative for suicidal ideas, confusion and sleep disturbance. The patient is nervous/anxious.     Objective:  BP 100/74 mmHg  Pulse 84  Wt 239 lb (108.41 kg)  SpO2 96%  BP Readings from Last 3 Encounters:  06/05/15 100/74  04/08/15 126/62  03/28/15 147/79    Wt Readings from Last 3 Encounters:  06/05/15 239 lb (108.41 kg)  05/29/15 240 lb 8 oz (109.09 kg)  04/17/15 250 lb 8 oz (113.626 kg)    Physical Exam  Constitutional: She appears well-developed. No distress.  HENT:  Head: Normocephalic.  Right Ear: External ear normal.  Left Ear: External ear normal.  Nose: Nose normal.  Mouth/Throat: Oropharynx is clear and moist.  Eyes: Conjunctivae are normal. Pupils are equal, round, and reactive to light. Right eye exhibits no discharge. Left eye exhibits no discharge.  Neck: Normal range of motion. Neck supple. No JVD present. No tracheal deviation present. No thyromegaly present.  Cardiovascular: Normal rate, regular rhythm and normal heart sounds.   Pulmonary/Chest: No stridor. No respiratory distress. She has no wheezes.  Abdominal: Soft. Bowel sounds are normal. She exhibits no distension and no mass. There is no tenderness. There is no rebound and no guarding.  Musculoskeletal: She exhibits no edema or tenderness.  Lymphadenopathy:    She has no cervical adenopathy.  Neurological: She displays normal reflexes. No cranial nerve deficit. She exhibits normal muscle tone. Coordination normal.  Skin: No rash noted. No erythema.  Psychiatric: She has a normal mood and affect. Her behavior is normal. Judgment and thought content normal.    Lab Results  Component Value Date   WBC 12.1* 04/08/2015   HGB 7.7* 04/08/2015   HCT 23.1* 04/08/2015   PLT 291  04/08/2015   GLUCOSE 125* 04/05/2015   CHOL 172 05/27/2010   TRIG 100.0 05/27/2010   HDL 46.30 05/27/2010   LDLCALC 106* 05/27/2010   ALT 12* 04/05/2015   AST 14* 04/05/2015   NA 138 04/05/2015   K 4.2 04/05/2015   CL 106 04/05/2015   CREATININE 1.18* 04/05/2015   BUN 19 04/05/2015   CO2 25 04/05/2015   TSH 0.59 02/27/2015   INR 1.20 04/04/2015   HGBA1C 5.9 12/17/2009   MICROALBUR 0.6 02/10/2013    No results found.  Assessment & Plan:   Diagnoses and all orders for this visit:  Essential hypertension  B12 deficiency  Gastroesophageal reflux disease without esophagitis  Chronic midline low back pain with sciatica, sciatica laterality unspecified  Other orders -     Discontinue: oxyCODONE (ROXICODONE) 15 MG immediate release tablet; Take 1 tablet (15 mg total) by mouth 4 (four) times daily. Please fill on or after  04/29/15 -     cyanocobalamin (,VITAMIN B-12,) 1000 MCG/ML injection; Inject 1 mL (1,000 mcg total) into the muscle every 14 (fourteen) days. -     Discontinue: oxyCODONE (ROXICODONE) 15 MG immediate release tablet; Take 0.5-1 tablets (7.5-15 mg total) by mouth every 6 (six) hours as needed for pain. Please fill on or after  06/05/15 -     Discontinue: oxyCODONE (ROXICODONE) 15 MG immediate release tablet; Take 0.5-1 tablets (7.5-15 mg total) by mouth every 6 (six) hours as needed for pain. Please fill on or after  07/03/15 -     oxyCODONE (ROXICODONE) 15 MG immediate release tablet; Take 0.5-1 tablets (7.5-15 mg total) by mouth every 6 (six) hours as needed for pain. Please fill on or after  08/03/15   I have discontinued Emily Sanchez oxyCODONE, oxyCODONE, oxyCODONE, and oxyCODONE. I have also changed her cyanocobalamin and oxyCODONE. Additionally, I am having her maintain her multivitamin with minerals, cyclobenzaprine, hydrochlorothiazide, SYRINGE-NEEDLE (DISP) 3 ML, ondansetron, pantoprazole, and loratadine.  Meds ordered this encounter  Medications  .  loratadine (CLARITIN) 10 MG tablet    Sig: Take 1 tablet by mouth daily as needed.    Refill:  2  . DISCONTD: oxyCODONE (ROXICODONE) 15 MG immediate release tablet    Sig: Take 1 tablet (15 mg total) by mouth 4 (four) times daily. Please fill on or after  04/29/15    Dispense:  120 tablet    Refill:  0  . cyanocobalamin (,VITAMIN B-12,) 1000 MCG/ML injection    Sig: Inject 1 mL (1,000 mcg total) into the muscle every 14 (fourteen) days.    Dispense:  20 mL    Refill:  3  . DISCONTD: oxyCODONE (ROXICODONE) 15 MG immediate release tablet    Sig: Take 0.5-1 tablets (7.5-15 mg total) by mouth every 6 (six) hours as needed for pain. Please fill on or after  06/05/15    Dispense:  120 tablet    Refill:  0  . DISCONTD: oxyCODONE (ROXICODONE) 15 MG immediate release tablet    Sig: Take 0.5-1 tablets (7.5-15 mg total) by mouth every 6 (six) hours as needed for  pain. Please fill on or after  07/03/15    Dispense:  120 tablet    Refill:  0  . oxyCODONE (ROXICODONE) 15 MG immediate release tablet    Sig: Take 0.5-1 tablets (7.5-15 mg total) by mouth every 6 (six) hours as needed for pain. Please fill on or after  08/03/15    Dispense:  120 tablet    Refill:  0     Follow-up: Return in about 3 months (around 09/02/2015) for a follow-up visit.  Walker Kehr, MD

## 2015-06-05 NOTE — Assessment & Plan Note (Signed)
Protonix po

## 2015-06-05 NOTE — Assessment & Plan Note (Signed)
On Oxycodone - try to take less  Potential benefits of a long term opioids use as well as potential risks (i.e. addiction risk, apnea etc) and complications (i.e. Somnolence, constipation and others) were explained to the patient and were aknowledged.

## 2015-06-05 NOTE — Assessment & Plan Note (Signed)
Take HCTZ prn swelling

## 2015-06-05 NOTE — Assessment & Plan Note (Signed)
B12 - try q14 or q 21 d sq

## 2015-06-05 NOTE — Assessment & Plan Note (Signed)
Doing well 

## 2015-06-05 NOTE — Progress Notes (Signed)
Pre visit review using our clinic review tool, if applicable. No additional management support is needed unless otherwise documented below in the visit note. 

## 2015-06-17 DIAGNOSIS — H5213 Myopia, bilateral: Secondary | ICD-10-CM | POA: Diagnosis not present

## 2015-06-17 DIAGNOSIS — H52223 Regular astigmatism, bilateral: Secondary | ICD-10-CM | POA: Diagnosis not present

## 2015-06-17 DIAGNOSIS — H04123 Dry eye syndrome of bilateral lacrimal glands: Secondary | ICD-10-CM | POA: Diagnosis not present

## 2015-06-17 DIAGNOSIS — H524 Presbyopia: Secondary | ICD-10-CM | POA: Diagnosis not present

## 2015-07-03 DIAGNOSIS — M25561 Pain in right knee: Secondary | ICD-10-CM | POA: Diagnosis not present

## 2015-07-03 DIAGNOSIS — M170 Bilateral primary osteoarthritis of knee: Secondary | ICD-10-CM | POA: Diagnosis not present

## 2015-07-03 DIAGNOSIS — M1711 Unilateral primary osteoarthritis, right knee: Secondary | ICD-10-CM | POA: Diagnosis not present

## 2015-07-03 DIAGNOSIS — M1712 Unilateral primary osteoarthritis, left knee: Secondary | ICD-10-CM | POA: Diagnosis not present

## 2015-07-30 DIAGNOSIS — K5090 Crohn's disease, unspecified, without complications: Secondary | ICD-10-CM | POA: Diagnosis not present

## 2015-07-30 DIAGNOSIS — I10 Essential (primary) hypertension: Secondary | ICD-10-CM | POA: Diagnosis not present

## 2015-07-30 DIAGNOSIS — N183 Chronic kidney disease, stage 3 (moderate): Secondary | ICD-10-CM | POA: Diagnosis not present

## 2015-07-30 DIAGNOSIS — N189 Chronic kidney disease, unspecified: Secondary | ICD-10-CM | POA: Diagnosis not present

## 2015-08-27 ENCOUNTER — Other Ambulatory Visit: Payer: Self-pay | Admitting: Internal Medicine

## 2015-08-30 ENCOUNTER — Encounter (HOSPITAL_COMMUNITY): Payer: Self-pay | Admitting: *Deleted

## 2015-08-30 ENCOUNTER — Emergency Department (HOSPITAL_COMMUNITY): Payer: Medicare Other

## 2015-08-30 ENCOUNTER — Inpatient Hospital Stay (HOSPITAL_COMMUNITY)
Admission: EM | Admit: 2015-08-30 | Discharge: 2015-09-04 | DRG: 964 | Disposition: A | Discharge status: Home / Self Care | Payer: Medicare Other | Attending: General Surgery | Admitting: General Surgery

## 2015-08-30 DIAGNOSIS — N179 Acute kidney failure, unspecified: Secondary | ICD-10-CM | POA: Diagnosis present

## 2015-08-30 DIAGNOSIS — S37031A Laceration of right kidney, unspecified degree, initial encounter: Secondary | ICD-10-CM | POA: Diagnosis present

## 2015-08-30 DIAGNOSIS — S299XXA Unspecified injury of thorax, initial encounter: Secondary | ICD-10-CM | POA: Diagnosis not present

## 2015-08-30 DIAGNOSIS — E2749 Other adrenocortical insufficiency: Secondary | ICD-10-CM | POA: Diagnosis present

## 2015-08-30 DIAGNOSIS — K5090 Crohn's disease, unspecified, without complications: Secondary | ICD-10-CM | POA: Diagnosis not present

## 2015-08-30 DIAGNOSIS — M1990 Unspecified osteoarthritis, unspecified site: Secondary | ICD-10-CM | POA: Diagnosis present

## 2015-08-30 DIAGNOSIS — S2242XA Multiple fractures of ribs, left side, initial encounter for closed fracture: Secondary | ICD-10-CM | POA: Diagnosis not present

## 2015-08-30 DIAGNOSIS — S37061A Major laceration of right kidney, initial encounter: Secondary | ICD-10-CM | POA: Diagnosis not present

## 2015-08-30 DIAGNOSIS — I129 Hypertensive chronic kidney disease with stage 1 through stage 4 chronic kidney disease, or unspecified chronic kidney disease: Secondary | ICD-10-CM | POA: Diagnosis present

## 2015-08-30 DIAGNOSIS — I959 Hypotension, unspecified: Secondary | ICD-10-CM | POA: Diagnosis not present

## 2015-08-30 DIAGNOSIS — S36113A Laceration of liver, unspecified degree, initial encounter: Secondary | ICD-10-CM | POA: Diagnosis present

## 2015-08-30 DIAGNOSIS — K219 Gastro-esophageal reflux disease without esophagitis: Secondary | ICD-10-CM | POA: Diagnosis present

## 2015-08-30 DIAGNOSIS — D62 Acute posthemorrhagic anemia: Secondary | ICD-10-CM | POA: Diagnosis not present

## 2015-08-30 DIAGNOSIS — S37091A Other injury of right kidney, initial encounter: Secondary | ICD-10-CM | POA: Diagnosis not present

## 2015-08-30 DIAGNOSIS — S37001A Unspecified injury of right kidney, initial encounter: Secondary | ICD-10-CM

## 2015-08-30 DIAGNOSIS — Z87891 Personal history of nicotine dependence: Secondary | ICD-10-CM

## 2015-08-30 DIAGNOSIS — R748 Abnormal levels of other serum enzymes: Secondary | ICD-10-CM

## 2015-08-30 DIAGNOSIS — R109 Unspecified abdominal pain: Secondary | ICD-10-CM | POA: Diagnosis not present

## 2015-08-30 DIAGNOSIS — R7989 Other specified abnormal findings of blood chemistry: Secondary | ICD-10-CM

## 2015-08-30 DIAGNOSIS — N183 Chronic kidney disease, stage 3 (moderate): Secondary | ICD-10-CM | POA: Diagnosis present

## 2015-08-30 DIAGNOSIS — R945 Abnormal results of liver function studies: Secondary | ICD-10-CM

## 2015-08-30 MED ORDER — MORPHINE SULFATE (PF) 4 MG/ML IV SOLN
4.0000 mg | Freq: Once | INTRAVENOUS | Status: AC
  Administered 2015-08-31: 4 mg via INTRAVENOUS
  Filled 2015-08-30: qty 1

## 2015-08-30 MED ORDER — ONDANSETRON HCL 4 MG/2ML IJ SOLN
4.0000 mg | Freq: Once | INTRAMUSCULAR | Status: AC
  Administered 2015-08-30: 4 mg via INTRAVENOUS
  Filled 2015-08-30: qty 2

## 2015-08-30 MED ORDER — SODIUM CHLORIDE 0.9 % IV SOLN
1000.0000 mL | Freq: Once | INTRAVENOUS | Status: AC
  Administered 2015-08-30: 1000 mL via INTRAVENOUS

## 2015-08-30 MED ORDER — SODIUM CHLORIDE 0.9 % IV SOLN
1000.0000 mL | INTRAVENOUS | Status: DC
  Administered 2015-08-31 (×4): 1000 mL via INTRAVENOUS
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
## 2015-08-31 ENCOUNTER — Other Ambulatory Visit: Payer: Self-pay | Admitting: Internal Medicine

## 2015-08-31 ENCOUNTER — Encounter (HOSPITAL_COMMUNITY): Payer: Self-pay | Admitting: *Deleted

## 2015-08-31 DIAGNOSIS — S36114A Minor laceration of liver, initial encounter: Secondary | ICD-10-CM | POA: Diagnosis not present

## 2015-08-31 DIAGNOSIS — Z87891 Personal history of nicotine dependence: Secondary | ICD-10-CM | POA: Diagnosis not present

## 2015-08-31 DIAGNOSIS — N179 Acute kidney failure, unspecified: Secondary | ICD-10-CM | POA: Diagnosis present

## 2015-08-31 DIAGNOSIS — S36113A Laceration of liver, unspecified degree, initial encounter: Secondary | ICD-10-CM | POA: Diagnosis present

## 2015-08-31 DIAGNOSIS — S37039A Laceration of unspecified kidney, unspecified degree, initial encounter: Secondary | ICD-10-CM | POA: Diagnosis not present

## 2015-08-31 DIAGNOSIS — S299XXA Unspecified injury of thorax, initial encounter: Secondary | ICD-10-CM | POA: Diagnosis not present

## 2015-08-31 DIAGNOSIS — D62 Acute posthemorrhagic anemia: Secondary | ICD-10-CM | POA: Diagnosis not present

## 2015-08-31 DIAGNOSIS — S37001A Unspecified injury of right kidney, initial encounter: Secondary | ICD-10-CM | POA: Diagnosis not present

## 2015-08-31 DIAGNOSIS — K5090 Crohn's disease, unspecified, without complications: Secondary | ICD-10-CM | POA: Diagnosis present

## 2015-08-31 DIAGNOSIS — S37031A Laceration of right kidney, unspecified degree, initial encounter: Secondary | ICD-10-CM | POA: Diagnosis present

## 2015-08-31 DIAGNOSIS — I129 Hypertensive chronic kidney disease with stage 1 through stage 4 chronic kidney disease, or unspecified chronic kidney disease: Secondary | ICD-10-CM | POA: Diagnosis present

## 2015-08-31 DIAGNOSIS — R109 Unspecified abdominal pain: Secondary | ICD-10-CM | POA: Diagnosis not present

## 2015-08-31 DIAGNOSIS — K219 Gastro-esophageal reflux disease without esophagitis: Secondary | ICD-10-CM | POA: Diagnosis present

## 2015-08-31 DIAGNOSIS — S37061A Major laceration of right kidney, initial encounter: Secondary | ICD-10-CM | POA: Diagnosis present

## 2015-08-31 DIAGNOSIS — I959 Hypotension, unspecified: Secondary | ICD-10-CM | POA: Diagnosis present

## 2015-08-31 DIAGNOSIS — N183 Chronic kidney disease, stage 3 (moderate): Secondary | ICD-10-CM | POA: Diagnosis present

## 2015-08-31 DIAGNOSIS — S37091A Other injury of right kidney, initial encounter: Secondary | ICD-10-CM | POA: Diagnosis not present

## 2015-08-31 DIAGNOSIS — M1990 Unspecified osteoarthritis, unspecified site: Secondary | ICD-10-CM | POA: Diagnosis present

## 2015-08-31 DIAGNOSIS — S2242XA Multiple fractures of ribs, left side, initial encounter for closed fracture: Secondary | ICD-10-CM | POA: Diagnosis present

## 2015-08-31 LAB — CBC WITH DIFFERENTIAL/PLATELET
BASOS ABS: 0 10*3/uL (ref 0.0–0.1)
BASOS PCT: 0 %
EOS ABS: 0.5 10*3/uL (ref 0.0–0.7)
EOS PCT: 3 %
HCT: 37.9 % (ref 36.0–46.0)
HEMOGLOBIN: 12.3 g/dL (ref 12.0–15.0)
LYMPHS ABS: 5.3 10*3/uL — AB (ref 0.7–4.0)
Lymphocytes Relative: 27 %
MCH: 27.5 pg (ref 26.0–34.0)
MCHC: 32.5 g/dL (ref 30.0–36.0)
MCV: 84.6 fL (ref 78.0–100.0)
Monocytes Absolute: 0.7 10*3/uL (ref 0.1–1.0)
Monocytes Relative: 4 %
NEUTROS PCT: 66 %
Neutro Abs: 13.2 10*3/uL — ABNORMAL HIGH (ref 1.7–7.7)
PLATELETS: 261 10*3/uL (ref 150–400)
RBC: 4.48 MIL/uL (ref 3.87–5.11)
RDW: 17.2 % — ABNORMAL HIGH (ref 11.5–15.5)
WBC: 19.7 10*3/uL — AB (ref 4.0–10.5)

## 2015-08-31 LAB — COMPREHENSIVE METABOLIC PANEL
ALK PHOS: 82 U/L (ref 38–126)
ALT: 143 U/L — ABNORMAL HIGH (ref 14–54)
ANION GAP: 13 (ref 5–15)
AST: 271 U/L — ABNORMAL HIGH (ref 15–41)
Albumin: 4.3 g/dL (ref 3.5–5.0)
BUN: 26 mg/dL — ABNORMAL HIGH (ref 6–20)
CALCIUM: 9.2 mg/dL (ref 8.9–10.3)
CHLORIDE: 109 mmol/L (ref 101–111)
CO2: 19 mmol/L — AB (ref 22–32)
Creatinine, Ser: 1.33 mg/dL — ABNORMAL HIGH (ref 0.44–1.00)
GFR, EST AFRICAN AMERICAN: 50 mL/min — AB (ref >60)
GFR, EST NON AFRICAN AMERICAN: 43 mL/min — AB (ref >60)
Glucose, Bld: 132 mg/dL — ABNORMAL HIGH (ref 65–99)
Potassium: 3.4 mmol/L — ABNORMAL LOW (ref 3.5–5.1)
SODIUM: 141 mmol/L (ref 135–145)
Total Bilirubin: <0.1 mg/dL — ABNORMAL LOW (ref 0.3–1.2)
Total Protein: 8.3 g/dL — ABNORMAL HIGH (ref 6.5–8.1)

## 2015-08-31 LAB — ABO/RH: ABO/RH(D): O POS

## 2015-08-31 LAB — CBC
HCT: 37.4 % (ref 36.0–46.0)
HCT: 31.6 % — ABNORMAL LOW (ref 36.0–46.0)
HCT: 31.8 % — ABNORMAL LOW (ref 36.0–46.0)
HEMOGLOBIN: 10.1 g/dL — AB (ref 12.0–15.0)
Hemoglobin: 12 g/dL (ref 12.0–15.0)
Hemoglobin: 9.8 g/dL — ABNORMAL LOW (ref 12.0–15.0)
MCH: 28.1 pg (ref 26.0–34.0)
MCH: 26.8 pg (ref 26.0–34.0)
MCH: 27.4 pg (ref 26.0–34.0)
MCHC: 32.1 g/dL (ref 30.0–36.0)
MCHC: 31 g/dL (ref 30.0–36.0)
MCHC: 31.8 g/dL (ref 30.0–36.0)
MCV: 87.6 fL (ref 78.0–100.0)
MCV: 86.3 fL (ref 78.0–100.0)
MCV: 86.2 fL (ref 78.0–100.0)
PLATELETS: 223 10*3/uL (ref 150–400)
PLATELETS: 194 10*3/uL (ref 150–400)
Platelets: 218 10*3/uL (ref 150–400)
RBC: 4.27 MIL/uL (ref 3.87–5.11)
RBC: 3.66 MIL/uL — ABNORMAL LOW (ref 3.87–5.11)
RBC: 3.69 MIL/uL — AB (ref 3.87–5.11)
RDW: 17.5 % — AB (ref 11.5–15.5)
RDW: 17.4 % — ABNORMAL HIGH (ref 11.5–15.5)
RDW: 17.5 % — ABNORMAL HIGH (ref 11.5–15.5)
WBC: 14.9 10*3/uL — ABNORMAL HIGH (ref 4.0–10.5)
WBC: 10 10*3/uL (ref 4.0–10.5)
WBC: 9.8 10*3/uL (ref 4.0–10.5)

## 2015-08-31 LAB — BASIC METABOLIC PANEL
Anion gap: 13 (ref 5–15)
BUN: 25 mg/dL — AB (ref 6–20)
CHLORIDE: 111 mmol/L (ref 101–111)
CO2: 18 mmol/L — ABNORMAL LOW (ref 22–32)
CREATININE: 1.56 mg/dL — AB (ref 0.44–1.00)
Calcium: 8.8 mg/dL — ABNORMAL LOW (ref 8.9–10.3)
GFR calc Af Amer: 41 mL/min — ABNORMAL LOW (ref >60)
GFR calc non Af Amer: 35 mL/min — ABNORMAL LOW (ref >60)
GLUCOSE: 109 mg/dL — AB (ref 65–99)
POTASSIUM: 3.7 mmol/L (ref 3.5–5.1)
Sodium: 142 mmol/L (ref 135–145)

## 2015-08-31 LAB — I-STAT CG4 LACTIC ACID, ED
LACTIC ACID, VENOUS: 4.23 mmol/L — AB (ref 0.5–2.0)
Lactic Acid, Venous: 3.24 mmol/L (ref 0.5–2.0)

## 2015-08-31 LAB — URINALYSIS, ROUTINE W REFLEX MICROSCOPIC
BILIRUBIN URINE: NEGATIVE
Glucose, UA: NEGATIVE mg/dL
Ketones, ur: NEGATIVE mg/dL
Nitrite: NEGATIVE
PROTEIN: 100 mg/dL — AB
SPECIFIC GRAVITY, URINE: 1.023 (ref 1.005–1.030)
pH: 6 (ref 5.0–8.0)

## 2015-08-31 LAB — TYPE AND SCREEN
ABO/RH(D): O POS
ABO/RH(D): O POS
ANTIBODY SCREEN: NEGATIVE
ANTIBODY SCREEN: NEGATIVE

## 2015-08-31 LAB — URINE MICROSCOPIC-ADD ON: Squamous Epithelial / LPF: NONE SEEN

## 2015-08-31 LAB — HEMOGLOBIN AND HEMATOCRIT, BLOOD
HCT: 34.1 % — ABNORMAL LOW (ref 36.0–46.0)
HEMOGLOBIN: 11 g/dL — AB (ref 12.0–15.0)

## 2015-08-31 LAB — LIPASE, BLOOD: LIPASE: 191 U/L — AB (ref 11–51)

## 2015-08-31 LAB — MRSA PCR SCREENING: MRSA by PCR: NEGATIVE

## 2015-08-31 MED ORDER — ONDANSETRON HCL 4 MG/2ML IJ SOLN: 4.0000 mg | Freq: Four times a day (QID) | INTRAMUSCULAR | Status: DC | PRN

## 2015-08-31 MED ORDER — HYDROMORPHONE HCL 1 MG/ML IJ SOLN
1.0000 mg | Freq: Once | INTRAMUSCULAR | Status: AC
  Administered 2015-08-31: 1 mg via INTRAVENOUS
  Filled 2015-08-31: qty 1

## 2015-08-31 MED ORDER — IOPAMIDOL (ISOVUE-300) INJECTION 61%
100.0000 mL | Freq: Once | INTRAVENOUS | Status: AC | PRN
  Administered 2015-08-31: 100 mL via INTRAVENOUS

## 2015-08-31 MED ORDER — HYDROMORPHONE HCL 1 MG/ML IJ SOLN
1.0000 mg | INTRAMUSCULAR | Status: DC | PRN
  Administered 2015-08-31 (×3): 2 mg via INTRAVENOUS
  Administered 2015-08-31 (×2): 1 mg via INTRAVENOUS
  Administered 2015-08-31 – 2015-09-01 (×12): 2 mg via INTRAVENOUS
  Administered 2015-09-01: 1 mg via INTRAVENOUS
  Administered 2015-09-02 (×2): 2 mg via INTRAVENOUS
  Administered 2015-09-02: 1 mg via INTRAVENOUS
  Administered 2015-09-02 – 2015-09-03 (×4): 2 mg via INTRAVENOUS
  Administered 2015-09-03: 1 mg via INTRAVENOUS
  Filled 2015-08-31: qty 2
  Filled 2015-08-31: qty 1
  Filled 2015-08-31 (×4): qty 2
  Filled 2015-08-31: qty 1
  Filled 2015-08-31 (×3): qty 2
  Filled 2015-08-31: qty 1
  Filled 2015-08-31 (×5): qty 2
  Filled 2015-08-31: qty 1
  Filled 2015-08-31 (×5): qty 2
  Filled 2015-08-31 (×2): qty 1
  Filled 2015-08-31 (×3): qty 2

## 2015-08-31 MED ORDER — SODIUM CHLORIDE 0.9 % IV BOLUS (SEPSIS)
1000.0000 mL | Freq: Once | INTRAVENOUS | Status: AC
  Administered 2015-08-31: 1000 mL via INTRAVENOUS

## 2015-08-31 MED ORDER — ONDANSETRON HCL 4 MG PO TABS: 4.0000 mg | ORAL_TABLET | Freq: Four times a day (QID) | ORAL | Status: DC | PRN

## 2015-08-31 MED ORDER — WHITE PETROLATUM GEL
Status: AC
  Administered 2015-08-31: 22:00:00
  Filled 2015-08-31: qty 1

## 2015-08-31 MED ORDER — MORPHINE SULFATE (PF) 4 MG/ML IV SOLN
4.0000 mg | Freq: Once | INTRAVENOUS | Status: AC
  Administered 2015-08-31: 4 mg via INTRAVENOUS
  Filled 2015-08-31: qty 1

## 2015-08-31 NOTE — Progress Notes (Signed)
   08/31/15 2356  Vitals  Temp 100.2 F (37.9 C)  Temp Source Oral  Pulse Rate (!) 112  ECG Heart Rate (!) 110  Resp (!) 29  Encoraged pt to use IS she obtained 750
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
## 2015-08-31 NOTE — ED Notes (Addendum)
Notified RN,Stephanie pt. i-stat CG4 Lactic acid 3.24.
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
## 2015-09-01 LAB — BASIC METABOLIC PANEL
Anion gap: 10 (ref 5–15)
BUN: 24 mg/dL — ABNORMAL HIGH (ref 6–20)
CALCIUM: 8.5 mg/dL — AB (ref 8.9–10.3)
CO2: 20 mmol/L — AB (ref 22–32)
CREATININE: 2.22 mg/dL — AB (ref 0.44–1.00)
Chloride: 109 mmol/L (ref 101–111)
GFR, EST AFRICAN AMERICAN: 27 mL/min — AB (ref >60)
GFR, EST NON AFRICAN AMERICAN: 23 mL/min — AB (ref >60)
Glucose, Bld: 148 mg/dL — ABNORMAL HIGH (ref 65–99)
Potassium: 3.8 mmol/L (ref 3.5–5.1)
SODIUM: 139 mmol/L (ref 135–145)

## 2015-09-01 MED ORDER — CYCLOBENZAPRINE HCL 5 MG PO TABS
5.0000 mg | ORAL_TABLET | Freq: Every evening | ORAL | Status: DC | PRN
  Administered 2015-09-01 – 2015-09-03 (×2): 10 mg via ORAL
  Filled 2015-09-01 (×2): qty 2

## 2015-09-01 MED ORDER — PANTOPRAZOLE SODIUM 40 MG PO TBEC
40.0000 mg | DELAYED_RELEASE_TABLET | Freq: Every day | ORAL | Status: DC
  Administered 2015-09-01 – 2015-09-04 (×4): 40 mg via ORAL
  Filled 2015-09-01 (×4): qty 1

## 2015-09-01 MED ORDER — SODIUM CHLORIDE 0.9 % IV SOLN
1000.0000 mL | INTRAVENOUS | Status: DC
  Administered 2015-09-01: 1000 mL via INTRAVENOUS

## 2015-09-01 MED ORDER — OXYCODONE HCL 5 MG PO TABS
7.5000 mg | ORAL_TABLET | Freq: Four times a day (QID) | ORAL | Status: DC | PRN
  Administered 2015-09-01 – 2015-09-03 (×6): 15 mg via ORAL
  Filled 2015-09-01 (×7): qty 3

## 2015-09-01 MED ORDER — CYANOCOBALAMIN 1000 MCG/ML IJ SOLN
1000.0000 ug | INTRAMUSCULAR | Status: DC
  Administered 2015-09-01: 1000 ug via INTRAMUSCULAR
  Filled 2015-09-01: qty 1
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
## 2015-09-01 NOTE — Progress Notes (Signed)
Patient ID: Emily Sanchez, female   DOB: 1955-10-13, 60 y.o.   MRN: [id number]    Subjective: Pt denies right flank or abdominal pain today.  Her catheter has been removed but she states that her urine has cleared.  She has continued to remain hemodynamically stable and has denied fever > 101.  Objective: Vital signs in last 24 hours: Temp:  [98.2 F (36.8 C)-100.2 F (37.9 C)] 98.2 F (36.8 C) (05/14 0806) Pulse Rate:  [90-112] 98 (05/14 0812) Resp:  [11-29] 24 (05/14 0812) BP: (120-142)/(68-90) 140/85 mmHg (05/14 0812) SpO2:  [91 %-95 %] 95 % (05/14 0812)  Intake/Output from previous day: 05/13 0701 - 05/14 0700 In: 3235 [P.O.:360; I.V.:2875] Out: 1150 [Urine:1150] Intake/Output this shift: Total I/O In: -  Out: 325 [Urine:325]  Physical Exam:  General: Alert and oriented Abdomen: Soft, ND, No CVAT   Lab Results:  Recent Labs  08/31/15 0456 08/31/15 1158 08/31/15 2009  HGB 12.0 9.8* 10.1*  HCT 37.4 31.6* 31.8*   BMET  Recent Labs  08/31/15 0456 09/01/15 1125  NA 142 139  K 3.7 3.8  CL 111 109  CO2 18* 20*  GLUCOSE 109* 148*  BUN 25* 24*  CREATININE 1.56* 2.22*  CALCIUM 8.8* 8.5*     Studies/Results:  Assessment/Plan: 1) Grade IV right renal injury: Continue to monitor hemoglobin levels. No indication for surgical intervention or repeat imaging at this time.  2) AKI: May be multifactorial be at least in part due to renal injury.  Continue to monitor renal function.   LOS: 1 day   Anihya Tuma,LES 09/01/2015, 2:45 PM

## 2015-09-02 LAB — CBC WITH DIFFERENTIAL/PLATELET
Basophils Absolute: 0 10*3/uL (ref 0.0–0.1)
Basophils Relative: 0 %
EOS PCT: 0 %
Eosinophils Absolute: 0 10*3/uL (ref 0.0–0.7)
HEMATOCRIT: 28.8 % — AB (ref 36.0–46.0)
Hemoglobin: 9.4 g/dL — ABNORMAL LOW (ref 12.0–15.0)
LYMPHS ABS: 2 10*3/uL (ref 0.7–4.0)
LYMPHS PCT: 13 %
MCH: 28.1 pg (ref 26.0–34.0)
MCHC: 32.6 g/dL (ref 30.0–36.0)
MCV: 86 fL (ref 78.0–100.0)
MONO ABS: 1.4 10*3/uL — AB (ref 0.1–1.0)
MONOS PCT: 9 %
Neutro Abs: 11.4 10*3/uL — ABNORMAL HIGH (ref 1.7–7.7)
Neutrophils Relative %: 78 %
PLATELETS: 164 10*3/uL (ref 150–400)
RBC: 3.35 MIL/uL — ABNORMAL LOW (ref 3.87–5.11)
RDW: 17.6 % — AB (ref 11.5–15.5)
WBC: 14.8 10*3/uL — ABNORMAL HIGH (ref 4.0–10.5)

## 2015-09-02 LAB — BASIC METABOLIC PANEL
Anion gap: 6 (ref 5–15)
BUN: 25 mg/dL — AB (ref 6–20)
CALCIUM: 8.6 mg/dL — AB (ref 8.9–10.3)
CO2: 21 mmol/L — AB (ref 22–32)
Chloride: 109 mmol/L (ref 101–111)
Creatinine, Ser: 2.09 mg/dL — ABNORMAL HIGH (ref 0.44–1.00)
GFR calc Af Amer: 29 mL/min — ABNORMAL LOW (ref >60)
GFR, EST NON AFRICAN AMERICAN: 25 mL/min — AB (ref >60)
GLUCOSE: 116 mg/dL — AB (ref 65–99)
Potassium: 3.6 mmol/L (ref 3.5–5.1)
Sodium: 136 mmol/L (ref 135–145)

## 2015-09-02 NOTE — Progress Notes (Signed)
Patient ID: Emily Sanchez, female   DOB: Dec 20, 1955, 60 y.o.   MRN: [id number]    Subjective: Pt doing well.  No fever, increased right flank pain, or hemodynamic instability.  Objective: Vital signs in last 24 hours: Temp:  [98.2 F (36.8 C)-99.2 F (37.3 C)] 99.1 F (37.3 C) (05/15 0719) Pulse Rate:  [88-104] 104 (05/15 0405) Resp:  [16-24] 16 (05/15 0405) BP: (91-142)/(68-91) 113/80 mmHg (05/15 0405) SpO2:  [92 %-98 %] 95 % (05/15 0405)  Intake/Output from previous day: 05/14 0701 - 05/15 0700 In: 970.7 [P.O.:760; I.V.:210.7] Out: 1225 [Urine:1125; Stool:100] Intake/Output this shift:    Physical Exam:  General: Alert and oriented Abd: Soft, minimal tenderness over right abdomen and flank Lab Results:  Recent Labs  08/31/15 1158 08/31/15 2009 09/02/15 0218  HGB 9.8* 10.1* 9.4*  HCT 31.6* 31.8* 28.8*   BMET  Recent Labs  09/01/15 1125 09/02/15 0218  NA 139 136  K 3.8 3.6  CL 109 109  CO2 20* 21*  GLUCOSE 148* 116*  BUN 24* 25*  CREATININE 2.22* 2.09*  CALCIUM 8.5* 8.6*     Studies/Results: No results found.  Assessment/Plan: 1) Grade IV right renal injury: Pt stable.  Assuming Hgb remains stable and no fever develops, I will plan to have her f/u in 6 weeks for repeat imaging of her kidney.  Ok to ambulated from a urologic perspective.  2) AKI: Renal function starting to improve.  Continue to monitor.     LOS: 2 days   Meko Bellanger,LES 09/02/2015, 7:25 AM
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
## 2015-09-02 NOTE — Progress Notes (Signed)
Patient to transfer to 8C88 report given to receiving nurse, all questions answered at this time.  Pt. VSS with no s/s of distress noted.  Patient stable at transfer.

## 2015-09-03 ENCOUNTER — Ambulatory Visit: Payer: Self-pay | Admitting: Internal Medicine

## 2015-09-03 DIAGNOSIS — S36113A Laceration of liver, unspecified degree, initial encounter: Secondary | ICD-10-CM | POA: Diagnosis present

## 2015-09-03 DIAGNOSIS — S2242XA Multiple fractures of ribs, left side, initial encounter for closed fracture: Secondary | ICD-10-CM | POA: Diagnosis present

## 2015-09-03 DIAGNOSIS — N179 Acute kidney failure, unspecified: Secondary | ICD-10-CM | POA: Diagnosis present

## 2015-09-03 DIAGNOSIS — E2749 Other adrenocortical insufficiency: Secondary | ICD-10-CM | POA: Diagnosis present

## 2015-09-03 MED ORDER — OXYCODONE HCL 5 MG PO TABS
10.0000 mg | ORAL_TABLET | ORAL | Status: DC | PRN
  Administered 2015-09-03 – 2015-09-04 (×4): 20 mg via ORAL
  Filled 2015-09-03 (×4): qty 4

## 2015-09-03 MED ORDER — DOCUSATE SODIUM 100 MG PO CAPS
100.0000 mg | ORAL_CAPSULE | Freq: Two times a day (BID) | ORAL | Status: DC
  Administered 2015-09-03: 100 mg via ORAL
  Filled 2015-09-03 (×3): qty 1

## 2015-09-03 MED ORDER — TRAMADOL HCL 50 MG PO TABS
100.0000 mg | ORAL_TABLET | Freq: Four times a day (QID) | ORAL | Status: DC
  Administered 2015-09-03 – 2015-09-04 (×5): 100 mg via ORAL
  Filled 2015-09-03 (×5): qty 2

## 2015-09-03 MED ORDER — HYDROMORPHONE HCL 1 MG/ML IJ SOLN
0.5000 mg | INTRAMUSCULAR | Status: DC | PRN
  Administered 2015-09-03 – 2015-09-04 (×3): 0.5 mg via INTRAVENOUS
  Filled 2015-09-03 (×3): qty 1

## 2015-09-03 MED ORDER — POLYETHYLENE GLYCOL 3350 17 G PO PACK
17.0000 g | PACK | Freq: Every day | ORAL | Status: DC
  Filled 2015-09-03: qty 1

## 2015-09-03 MED ORDER — HYDROCHLOROTHIAZIDE 12.5 MG PO CAPS
12.5000 mg | ORAL_CAPSULE | Freq: Every day | ORAL | Status: DC
  Filled 2015-09-03 (×2): qty 1

## 2015-09-03 MED FILL — Fentanyl Citrate Preservative Free (PF) Inj 100 MCG/2ML: INTRAMUSCULAR | Qty: 2 | Status: AC

## 2015-09-03 NOTE — Discharge Instructions (Addendum)
Continue to take Microzide (HCTZ) at discharge. -------------------------------------------------------------------------------------------------------------------------------------------------  No driving while taking oxycodone.  No running, jumping, ball or contact sports, bikes, skateboards, motorcycles, etc for 3 months.
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
## 2015-09-03 NOTE — Progress Notes (Signed)
Pt. Stated she was not hungry but she did kept her yogurt and i gave her a cup of ice drink.

## 2015-09-04 LAB — CBC
HEMATOCRIT: 27.2 % — AB (ref 36.0–46.0)
Hemoglobin: 8.7 g/dL — ABNORMAL LOW (ref 12.0–15.0)
MCH: 27.3 pg (ref 26.0–34.0)
MCHC: 32 g/dL (ref 30.0–36.0)
MCV: 85.3 fL (ref 78.0–100.0)
PLATELETS: 206 10*3/uL (ref 150–400)
RBC: 3.19 MIL/uL — AB (ref 3.87–5.11)
RDW: 16.8 % — AB (ref 11.5–15.5)
WBC: 13.4 10*3/uL — AB (ref 4.0–10.5)

## 2015-09-04 LAB — BASIC METABOLIC PANEL
Anion gap: 10 (ref 5–15)
BUN: 20 mg/dL (ref 6–20)
CHLORIDE: 102 mmol/L (ref 101–111)
CO2: 22 mmol/L (ref 22–32)
Calcium: 8.9 mg/dL (ref 8.9–10.3)
Creatinine, Ser: 1.76 mg/dL — ABNORMAL HIGH (ref 0.44–1.00)
GFR calc Af Amer: 35 mL/min — ABNORMAL LOW (ref >60)
GFR calc non Af Amer: 31 mL/min — ABNORMAL LOW (ref >60)
GLUCOSE: 82 mg/dL (ref 65–99)
POTASSIUM: 3.3 mmol/L — AB (ref 3.5–5.1)
Sodium: 134 mmol/L — ABNORMAL LOW (ref 135–145)

## 2015-09-04 MED ORDER — TRAMADOL HCL 50 MG PO TABS: 100.0000 mg | ORAL_TABLET | Freq: Four times a day (QID) | ORAL | Status: DC

## 2015-09-04 MED ORDER — OXYCODONE-ACETAMINOPHEN 10-325 MG PO TABS: 1.0000 | ORAL_TABLET | ORAL | Status: DC | PRN
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
## 2015-09-04 NOTE — Care Management Important Message (Signed)
Important Message  Patient Details  Name: Emily Sanchez MRN: [id number] Date of Birth: 1955/04/24   Medicare Important Message Given:  Yes    Reinaldo Raddle, RN, BSN  Trauma/Neuro ICU Case Manager [phone]

## 2015-09-04 NOTE — Progress Notes (Signed)
Discharge papers gone over with pt. Prescriptions given to pt. No questions/complaints. IV taken out. Pt. D/c'd successfully via w/c.
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
## 2015-10-07 ENCOUNTER — Encounter: Payer: Self-pay | Admitting: Internal Medicine

## 2015-10-07 ENCOUNTER — Ambulatory Visit (INDEPENDENT_AMBULATORY_CARE_PROVIDER_SITE_OTHER): Payer: Medicare Other | Admitting: Internal Medicine

## 2015-10-07 VITALS — BP 106/70 | HR 82 | Wt 208.0 lb

## 2015-10-07 DIAGNOSIS — I10 Essential (primary) hypertension: Secondary | ICD-10-CM | POA: Diagnosis not present

## 2015-10-07 DIAGNOSIS — M5440 Lumbago with sciatica, unspecified side: Secondary | ICD-10-CM

## 2015-10-07 DIAGNOSIS — M25561 Pain in right knee: Secondary | ICD-10-CM

## 2015-10-07 DIAGNOSIS — G8929 Other chronic pain: Secondary | ICD-10-CM

## 2015-10-07 DIAGNOSIS — E538 Deficiency of other specified B group vitamins: Secondary | ICD-10-CM

## 2015-10-07 DIAGNOSIS — M25562 Pain in left knee: Secondary | ICD-10-CM

## 2015-10-07 MED ORDER — CYANOCOBALAMIN 1000 MCG/ML IJ SOLN: 1000.0000 ug | INTRAMUSCULAR | Status: DC

## 2015-10-07 MED ORDER — TRAMADOL HCL 50 MG PO TABS: 50.0000 mg | ORAL_TABLET | Freq: Four times a day (QID) | ORAL | Status: DC | PRN

## 2015-10-07 MED ORDER — "SYRINGE/NEEDLE (DISP) 23G X 1"" 3 ML MISC": 1.0000 | Status: DC

## 2015-10-07 NOTE — Assessment & Plan Note (Signed)
On B12 

## 2015-10-07 NOTE — Assessment & Plan Note (Signed)
HCTZ prn swelling
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
## 2015-10-07 NOTE — Assessment & Plan Note (Signed)
S/p sleeve procedure 03/2015 Wt Readings from Last 3 Encounters:  10/07/15 208 lb (94.348 kg)  09/02/15 242 lb 8.1 oz (110 kg)  06/05/15 239 lb (108.41 kg)
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
## 2015-10-07 NOTE — Assessment & Plan Note (Signed)
On Tramadol 6/17  Potential benefits of a long term opioids use as well as potential risks (i.e. addiction risk, apnea etc) and complications (i.e. Somnolence, constipation and others) were explained to the patient and were aknowledged.

## 2015-10-07 NOTE — Assessment & Plan Note (Signed)
Off Oxycodone - better after wt loss On Tramadol 6/17  Potential benefits of a long term opioids use as well as potential risks (i.e. addiction risk, apnea etc) and complications (i.e. Somnolence, constipation and others) were explained to the patient and were aknowledged.

## 2015-10-07 NOTE — Progress Notes (Signed)
Pre visit review using our clinic review tool, if applicable. No additional management support is needed unless otherwise documented below in the visit note. 

## 2015-10-08 ENCOUNTER — Ambulatory Visit: Payer: Self-pay | Admitting: Internal Medicine

## 2015-10-29 DIAGNOSIS — S37091A Other injury of right kidney, initial encounter: Secondary | ICD-10-CM | POA: Diagnosis not present

## 2015-10-29 DIAGNOSIS — S37031A Laceration of right kidney, unspecified degree, initial encounter: Secondary | ICD-10-CM | POA: Diagnosis not present

## 2015-10-30 DIAGNOSIS — S37091A Other injury of right kidney, initial encounter: Secondary | ICD-10-CM | POA: Diagnosis not present

## 2015-11-07 DIAGNOSIS — Z9884 Bariatric surgery status: Secondary | ICD-10-CM | POA: Diagnosis not present

## 2015-11-26 ENCOUNTER — Ambulatory Visit: Payer: Self-pay | Admitting: Dietician

## 2015-12-10 ENCOUNTER — Ambulatory Visit (INDEPENDENT_AMBULATORY_CARE_PROVIDER_SITE_OTHER): Payer: Medicare Other | Admitting: Internal Medicine

## 2015-12-10 ENCOUNTER — Encounter: Payer: Self-pay | Admitting: Internal Medicine

## 2015-12-10 VITALS — BP 124/80 | HR 84 | Temp 98.5°F | Wt 206.0 lb

## 2015-12-10 DIAGNOSIS — Z23 Encounter for immunization: Secondary | ICD-10-CM | POA: Diagnosis not present

## 2015-12-10 DIAGNOSIS — M5440 Lumbago with sciatica, unspecified side: Secondary | ICD-10-CM

## 2015-12-10 DIAGNOSIS — G9332 Myalgic encephalomyelitis/chronic fatigue syndrome: Secondary | ICD-10-CM

## 2015-12-10 DIAGNOSIS — R5382 Chronic fatigue, unspecified: Secondary | ICD-10-CM

## 2015-12-10 DIAGNOSIS — E538 Deficiency of other specified B group vitamins: Secondary | ICD-10-CM | POA: Diagnosis not present

## 2015-12-10 DIAGNOSIS — I10 Essential (primary) hypertension: Secondary | ICD-10-CM

## 2015-12-10 DIAGNOSIS — N183 Chronic kidney disease, stage 3 unspecified: Secondary | ICD-10-CM

## 2015-12-10 DIAGNOSIS — E785 Hyperlipidemia, unspecified: Secondary | ICD-10-CM

## 2015-12-10 DIAGNOSIS — D509 Iron deficiency anemia, unspecified: Secondary | ICD-10-CM

## 2015-12-10 DIAGNOSIS — E559 Vitamin D deficiency, unspecified: Secondary | ICD-10-CM

## 2015-12-10 DIAGNOSIS — F411 Generalized anxiety disorder: Secondary | ICD-10-CM

## 2015-12-10 DIAGNOSIS — G8929 Other chronic pain: Secondary | ICD-10-CM

## 2015-12-10 MED ORDER — TRAMADOL-ACETAMINOPHEN 37.5-325 MG PO TABS: 1.0000 | ORAL_TABLET | Freq: Four times a day (QID) | ORAL | 3 refills | Status: DC | PRN

## 2015-12-10 MED ORDER — "SYRINGE/NEEDLE (DISP) 23G X 1"" 3 ML MISC": 1.0000 | 3 refills | Status: DC

## 2015-12-10 MED ORDER — CYANOCOBALAMIN 1000 MCG/ML IJ SOLN: 1000.0000 ug | INTRAMUSCULAR | 3 refills | Status: DC

## 2015-12-10 MED ORDER — CYCLOBENZAPRINE HCL 5 MG PO TABS: 5.0000 mg | ORAL_TABLET | Freq: Every day | ORAL | 5 refills | Status: DC

## 2015-12-10 NOTE — Assessment & Plan Note (Signed)
Nl BP off meds

## 2015-12-10 NOTE — Assessment & Plan Note (Signed)
Labs

## 2015-12-10 NOTE — Assessment & Plan Note (Signed)
On Tramadol 50-100 mg prn  Potential benefits of a long term opioids use as well as potential risks (i.e. addiction risk, apnea etc) and complications (i.e. Somnolence, constipation and others) were explained to the patient and were aknowledged. Flexeril 5 mg at hs

## 2015-12-10 NOTE — Assessment & Plan Note (Signed)
On B12 

## 2015-12-10 NOTE — Progress Notes (Signed)
Pre visit review using our clinic review tool, if applicable. No additional management support is needed unless otherwise documented below in the visit note. 

## 2015-12-10 NOTE — Assessment & Plan Note (Signed)
Off Rx 

## 2015-12-10 NOTE — Addendum Note (Signed)
Addended by: Cresenciano Lick on: 12/10/2015 02:29 PM   Modules accepted: Orders
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
## 2015-12-10 NOTE — Assessment & Plan Note (Signed)
On Tramadol prn

## 2015-12-16 DIAGNOSIS — M1711 Unilateral primary osteoarthritis, right knee: Secondary | ICD-10-CM | POA: Diagnosis not present

## 2015-12-16 DIAGNOSIS — M25561 Pain in right knee: Secondary | ICD-10-CM | POA: Diagnosis not present

## 2015-12-16 DIAGNOSIS — M1712 Unilateral primary osteoarthritis, left knee: Secondary | ICD-10-CM | POA: Diagnosis not present

## 2016-02-16 ENCOUNTER — Other Ambulatory Visit: Payer: Self-pay | Admitting: Internal Medicine

## 2016-02-21 ENCOUNTER — Other Ambulatory Visit (INDEPENDENT_AMBULATORY_CARE_PROVIDER_SITE_OTHER): Payer: Medicare Other

## 2016-02-21 DIAGNOSIS — I10 Essential (primary) hypertension: Secondary | ICD-10-CM | POA: Diagnosis not present

## 2016-02-21 DIAGNOSIS — G9332 Myalgic encephalomyelitis/chronic fatigue syndrome: Secondary | ICD-10-CM

## 2016-02-21 DIAGNOSIS — M5440 Lumbago with sciatica, unspecified side: Secondary | ICD-10-CM | POA: Diagnosis not present

## 2016-02-21 DIAGNOSIS — E538 Deficiency of other specified B group vitamins: Secondary | ICD-10-CM

## 2016-02-21 DIAGNOSIS — D509 Iron deficiency anemia, unspecified: Secondary | ICD-10-CM

## 2016-02-21 DIAGNOSIS — E559 Vitamin D deficiency, unspecified: Secondary | ICD-10-CM | POA: Diagnosis not present

## 2016-02-21 DIAGNOSIS — G8929 Other chronic pain: Secondary | ICD-10-CM

## 2016-02-21 DIAGNOSIS — E785 Hyperlipidemia, unspecified: Secondary | ICD-10-CM

## 2016-02-21 DIAGNOSIS — R5382 Chronic fatigue, unspecified: Secondary | ICD-10-CM | POA: Diagnosis not present

## 2016-02-21 LAB — CBC WITH DIFFERENTIAL/PLATELET
Basophils Absolute: 0.1 10*3/uL (ref 0.0–0.1)
Basophils Relative: 0.9 % (ref 0.0–3.0)
EOS ABS: 1.2 10*3/uL — AB (ref 0.0–0.7)
Eosinophils Relative: 16.3 % — ABNORMAL HIGH (ref 0.0–5.0)
HCT: 36.2 % (ref 36.0–46.0)
HEMOGLOBIN: 12.1 g/dL (ref 12.0–15.0)
Lymphocytes Relative: 30.3 % (ref 12.0–46.0)
Lymphs Abs: 2.2 10*3/uL (ref 0.7–4.0)
MCHC: 33.4 g/dL (ref 30.0–36.0)
MCV: 87.8 fl (ref 78.0–100.0)
MONO ABS: 0.3 10*3/uL (ref 0.1–1.0)
Monocytes Relative: 4.7 % (ref 3.0–12.0)
NEUTROS PCT: 47.8 % (ref 43.0–77.0)
Neutro Abs: 3.5 10*3/uL (ref 1.4–7.7)
Platelets: 221 10*3/uL (ref 150.0–400.0)
RBC: 4.12 Mil/uL (ref 3.87–5.11)
RDW: 17.1 % — AB (ref 11.5–15.5)
WBC: 7.3 10*3/uL (ref 4.0–10.5)

## 2016-02-21 LAB — VITAMIN B12

## 2016-02-21 LAB — IBC PANEL
IRON: 45 ug/dL (ref 42–145)
Saturation Ratios: 15.6 % — ABNORMAL LOW (ref 20.0–50.0)
TRANSFERRIN: 206 mg/dL — AB (ref 212.0–360.0)

## 2016-02-21 LAB — TSH: TSH: 0.54 u[IU]/mL (ref 0.35–4.50)

## 2016-02-21 LAB — VITAMIN D 25 HYDROXY (VIT D DEFICIENCY, FRACTURES): VITD: 14.12 ng/mL — ABNORMAL LOW (ref 30.00–100.00)

## 2016-02-24 ENCOUNTER — Other Ambulatory Visit: Payer: Self-pay | Admitting: Internal Medicine

## 2016-02-24 MED ORDER — FERROUS SULFATE 325 (65 FE) MG PO TABS: 325.0000 mg | ORAL_TABLET | Freq: Two times a day (BID) | ORAL | 6 refills | Status: DC

## 2016-02-24 MED ORDER — ERGOCALCIFEROL 1.25 MG (50000 UT) PO CAPS: 50000.0000 [IU] | ORAL_CAPSULE | ORAL | 0 refills | Status: DC

## 2016-02-25 LAB — VITAMIN B1, WHOLE BLOOD: VITAMIN B1 (THIAMINE), BLOOD: 130 nmol/L (ref 78–185)

## 2016-03-04 ENCOUNTER — Encounter: Payer: Self-pay | Admitting: Internal Medicine

## 2016-03-04 ENCOUNTER — Ambulatory Visit (INDEPENDENT_AMBULATORY_CARE_PROVIDER_SITE_OTHER): Payer: Medicare Other | Admitting: Internal Medicine

## 2016-03-04 VITALS — BP 130/80 | HR 88 | Wt 207.0 lb

## 2016-03-04 DIAGNOSIS — M5440 Lumbago with sciatica, unspecified side: Secondary | ICD-10-CM

## 2016-03-04 DIAGNOSIS — E538 Deficiency of other specified B group vitamins: Secondary | ICD-10-CM

## 2016-03-04 DIAGNOSIS — N183 Chronic kidney disease, stage 3 unspecified: Secondary | ICD-10-CM

## 2016-03-04 DIAGNOSIS — G8929 Other chronic pain: Secondary | ICD-10-CM

## 2016-03-04 DIAGNOSIS — D638 Anemia in other chronic diseases classified elsewhere: Secondary | ICD-10-CM

## 2016-03-04 DIAGNOSIS — I10 Essential (primary) hypertension: Secondary | ICD-10-CM

## 2016-03-04 MED ORDER — TRAMADOL-ACETAMINOPHEN 37.5-325 MG PO TABS: 1.0000 | ORAL_TABLET | Freq: Four times a day (QID) | ORAL | 3 refills | Status: DC | PRN

## 2016-03-04 MED ORDER — PANTOPRAZOLE SODIUM 40 MG PO TBEC: 40.0000 mg | DELAYED_RELEASE_TABLET | Freq: Every day | ORAL | 3 refills | Status: DC

## 2016-03-04 MED ORDER — ERGOCALCIFEROL 1.25 MG (50000 UT) PO CAPS: 50000.0000 [IU] | ORAL_CAPSULE | ORAL | 11 refills | Status: AC

## 2016-03-04 NOTE — Assessment & Plan Note (Signed)
Off HCTZ
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
## 2016-03-04 NOTE — Assessment & Plan Note (Signed)
S/p sleeve

## 2016-03-04 NOTE — Assessment & Plan Note (Signed)
Tramadol 50-100 mg prn - not helping much Flexeril at hs

## 2016-03-04 NOTE — Assessment & Plan Note (Signed)
Due to blood loss, sleeve procedure: on iron - better

## 2016-03-04 NOTE — Progress Notes (Signed)
Pre visit review using our clinic review tool, if applicable. No additional management support is needed unless otherwise documented below in the visit note. 

## 2016-03-04 NOTE — Assessment & Plan Note (Signed)
On B12 

## 2016-04-16 DIAGNOSIS — M1712 Unilateral primary osteoarthritis, left knee: Secondary | ICD-10-CM | POA: Diagnosis not present

## 2016-04-16 DIAGNOSIS — M170 Bilateral primary osteoarthritis of knee: Secondary | ICD-10-CM | POA: Diagnosis not present

## 2016-04-16 DIAGNOSIS — M25561 Pain in right knee: Secondary | ICD-10-CM | POA: Diagnosis not present

## 2016-04-16 DIAGNOSIS — M1711 Unilateral primary osteoarthritis, right knee: Secondary | ICD-10-CM | POA: Diagnosis not present

## 2016-04-16 DIAGNOSIS — M25562 Pain in left knee: Secondary | ICD-10-CM | POA: Diagnosis not present

## 2016-04-30 DIAGNOSIS — Z0289 Encounter for other administrative examinations: Secondary | ICD-10-CM

## 2016-05-29 DIAGNOSIS — Z9884 Bariatric surgery status: Secondary | ICD-10-CM | POA: Diagnosis not present

## 2016-06-09 ENCOUNTER — Ambulatory Visit: Payer: Self-pay | Admitting: Internal Medicine

## 2016-06-24 ENCOUNTER — Other Ambulatory Visit (INDEPENDENT_AMBULATORY_CARE_PROVIDER_SITE_OTHER): Payer: Medicare Other

## 2016-06-24 ENCOUNTER — Encounter: Payer: Self-pay | Admitting: Internal Medicine

## 2016-06-24 ENCOUNTER — Ambulatory Visit (INDEPENDENT_AMBULATORY_CARE_PROVIDER_SITE_OTHER): Payer: Medicare Other | Admitting: Internal Medicine

## 2016-06-24 VITALS — BP 108/78 | HR 92 | Temp 98.8°F | Resp 16 | Ht 65.0 in | Wt 212.2 lb

## 2016-06-24 DIAGNOSIS — D638 Anemia in other chronic diseases classified elsewhere: Secondary | ICD-10-CM

## 2016-06-24 DIAGNOSIS — I10 Essential (primary) hypertension: Secondary | ICD-10-CM | POA: Diagnosis not present

## 2016-06-24 DIAGNOSIS — K50118 Crohn's disease of large intestine with other complication: Secondary | ICD-10-CM

## 2016-06-24 DIAGNOSIS — N183 Chronic kidney disease, stage 3 unspecified: Secondary | ICD-10-CM

## 2016-06-24 DIAGNOSIS — Z23 Encounter for immunization: Secondary | ICD-10-CM | POA: Diagnosis not present

## 2016-06-24 DIAGNOSIS — E538 Deficiency of other specified B group vitamins: Secondary | ICD-10-CM

## 2016-06-24 DIAGNOSIS — M797 Fibromyalgia: Secondary | ICD-10-CM | POA: Diagnosis not present

## 2016-06-24 LAB — CBC WITH DIFFERENTIAL/PLATELET
BASOS ABS: 0.1 10*3/uL (ref 0.0–0.1)
Basophils Relative: 1 % (ref 0.0–3.0)
Eosinophils Absolute: 0.7 10*3/uL (ref 0.0–0.7)
Eosinophils Relative: 9.8 % — ABNORMAL HIGH (ref 0.0–5.0)
HCT: 38.9 % (ref 36.0–46.0)
Hemoglobin: 12.7 g/dL (ref 12.0–15.0)
LYMPHS ABS: 2 10*3/uL (ref 0.7–4.0)
Lymphocytes Relative: 30.2 % (ref 12.0–46.0)
MCHC: 32.6 g/dL (ref 30.0–36.0)
MCV: 92.3 fl (ref 78.0–100.0)
MONO ABS: 0.6 10*3/uL (ref 0.1–1.0)
Monocytes Relative: 8.7 % (ref 3.0–12.0)
NEUTROS PCT: 50.3 % (ref 43.0–77.0)
Neutro Abs: 3.4 10*3/uL (ref 1.4–7.7)
Platelets: 275 10*3/uL (ref 150.0–400.0)
RBC: 4.21 Mil/uL (ref 3.87–5.11)
RDW: 14.2 % (ref 11.5–15.5)
WBC: 6.7 10*3/uL (ref 4.0–10.5)

## 2016-06-24 LAB — IBC PANEL
IRON: 73 ug/dL (ref 42–145)
Saturation Ratios: 21.7 % (ref 20.0–50.0)
TRANSFERRIN: 240 mg/dL (ref 212.0–360.0)

## 2016-06-24 LAB — BASIC METABOLIC PANEL
BUN: 31 mg/dL — AB (ref 6–23)
CALCIUM: 10.1 mg/dL (ref 8.4–10.5)
CO2: 28 mEq/L (ref 19–32)
Chloride: 105 mEq/L (ref 96–112)
Creatinine, Ser: 1.28 mg/dL — ABNORMAL HIGH (ref 0.40–1.20)
GFR: 54.57 mL/min — AB (ref >60.00)
GLUCOSE: 101 mg/dL — AB (ref 70–99)
Potassium: 4 mEq/L (ref 3.5–5.1)
Sodium: 140 mEq/L (ref 135–145)

## 2016-06-24 MED ORDER — CYCLOBENZAPRINE HCL 5 MG PO TABS: 5.0000 mg | ORAL_TABLET | Freq: Every day | ORAL | 5 refills | Status: DC

## 2016-06-24 MED ORDER — TRAMADOL-ACETAMINOPHEN 37.5-325 MG PO TABS: 1.0000 | ORAL_TABLET | Freq: Four times a day (QID) | ORAL | 3 refills | Status: DC | PRN

## 2016-06-24 NOTE — Progress Notes (Signed)
Pre-visit discussion using our clinic review tool. No additional management support is needed unless otherwise documented below in the visit note.  

## 2016-06-24 NOTE — Assessment & Plan Note (Signed)
Improve sleep Flexeril prn Tramadol prn

## 2016-06-24 NOTE — Assessment & Plan Note (Signed)
On HCTZ if needed NAS diet

## 2016-06-24 NOTE — Assessment & Plan Note (Signed)
BMET 

## 2016-06-24 NOTE — Assessment & Plan Note (Signed)
IM/sq B12

## 2016-06-24 NOTE — Assessment & Plan Note (Signed)
Colostostomy

## 2016-06-24 NOTE — Assessment & Plan Note (Signed)
discussed
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
## 2016-07-05 DIAGNOSIS — Z0289 Encounter for other administrative examinations: Secondary | ICD-10-CM

## 2016-07-24 ENCOUNTER — Telehealth: Payer: Self-pay | Admitting: Internal Medicine

## 2016-07-24 NOTE — Telephone Encounter (Signed)
Called patient to schedule awv. Patient did not answer. Will try to call patient at a later time.

## 2016-08-05 DIAGNOSIS — Z0289 Encounter for other administrative examinations: Secondary | ICD-10-CM

## 2016-08-14 ENCOUNTER — Telehealth: Payer: Self-pay | Admitting: Internal Medicine

## 2016-08-14 NOTE — Telephone Encounter (Signed)
Pt needas PA for larger Qt of traMADol-acetaminophen (ULTRACET)   [facility] on [address] [employer] for [facility]  Phone number # [phone]

## 2016-08-14 NOTE — Telephone Encounter (Signed)
Appeal started, waiting for fax to come through

## 2016-08-27 NOTE — Telephone Encounter (Signed)
Pt called regarding this. Pt received letter from [facility] and this was denied,    She states if you fax a letter with her detailed symptoms and diagnosis and why she needs the medicine she can get it because of medical necessity .  Fax [phone]

## 2016-08-31 NOTE — Telephone Encounter (Signed)
Letter faxed.

## 2016-10-03 ENCOUNTER — Other Ambulatory Visit: Payer: Self-pay | Admitting: Internal Medicine

## 2016-10-13 ENCOUNTER — Ambulatory Visit (INDEPENDENT_AMBULATORY_CARE_PROVIDER_SITE_OTHER): Payer: Medicare Other | Admitting: Internal Medicine

## 2016-10-13 ENCOUNTER — Encounter: Payer: Self-pay | Admitting: Internal Medicine

## 2016-10-13 DIAGNOSIS — M797 Fibromyalgia: Secondary | ICD-10-CM | POA: Diagnosis not present

## 2016-10-13 DIAGNOSIS — G8929 Other chronic pain: Secondary | ICD-10-CM | POA: Diagnosis not present

## 2016-10-13 DIAGNOSIS — M5440 Lumbago with sciatica, unspecified side: Secondary | ICD-10-CM | POA: Diagnosis not present

## 2016-10-13 DIAGNOSIS — E538 Deficiency of other specified B group vitamins: Secondary | ICD-10-CM

## 2016-10-13 MED ORDER — MILNACIPRAN HCL 50 MG PO TABS: 50.0000 mg | ORAL_TABLET | Freq: Two times a day (BID) | ORAL | 5 refills | Status: DC

## 2016-10-13 MED ORDER — PANTOPRAZOLE SODIUM 40 MG PO TBEC: 40.0000 mg | DELAYED_RELEASE_TABLET | Freq: Every day | ORAL | 3 refills | Status: DC

## 2016-10-13 MED ORDER — TRAMADOL-ACETAMINOPHEN 37.5-325 MG PO TABS: 1.0000 | ORAL_TABLET | Freq: Four times a day (QID) | ORAL | 3 refills | Status: DC | PRN
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
## 2016-10-13 NOTE — Assessment & Plan Note (Signed)
Tramadol - APAP Start Savella -  Potential benefits of a long term savella use as well as potential risks  and complications were explained to the patient and were aknowledged.

## 2016-10-13 NOTE — Assessment & Plan Note (Addendum)
Worse Tramadol - APAP Pharm is giving her Ultracet 30 per 1 mo ?due to insurance...  Start Savella -  Potential benefits of a long term savella use as well as potential risks  and complications were explained to the patient and were aknowledged.

## 2016-10-13 NOTE — Assessment & Plan Note (Signed)
On B12 

## 2016-10-13 NOTE — Patient Instructions (Signed)
Try Turmeric

## 2016-10-23 DIAGNOSIS — M170 Bilateral primary osteoarthritis of knee: Secondary | ICD-10-CM | POA: Diagnosis not present

## 2016-10-23 DIAGNOSIS — M1711 Unilateral primary osteoarthritis, right knee: Secondary | ICD-10-CM | POA: Diagnosis not present

## 2016-10-23 DIAGNOSIS — M1712 Unilateral primary osteoarthritis, left knee: Secondary | ICD-10-CM | POA: Diagnosis not present

## 2016-10-27 DIAGNOSIS — H524 Presbyopia: Secondary | ICD-10-CM | POA: Diagnosis not present

## 2016-10-27 DIAGNOSIS — H5213 Myopia, bilateral: Secondary | ICD-10-CM | POA: Diagnosis not present

## 2016-10-27 DIAGNOSIS — H52223 Regular astigmatism, bilateral: Secondary | ICD-10-CM | POA: Diagnosis not present

## 2016-10-27 DIAGNOSIS — H1045 Other chronic allergic conjunctivitis: Secondary | ICD-10-CM | POA: Diagnosis not present

## 2016-12-25 ENCOUNTER — Telehealth: Payer: Self-pay | Admitting: Internal Medicine

## 2016-12-25 MED ORDER — CYCLOBENZAPRINE HCL 5 MG PO TABS: 5.0000 mg | ORAL_TABLET | Freq: Every day | ORAL | 2 refills | Status: DC

## 2016-12-25 NOTE — Telephone Encounter (Signed)
Notified pt [facility].../[facility]

## 2016-12-25 NOTE — Telephone Encounter (Signed)
cyclobenzaprine (FLEXERIL) 5 MG tablet   Patient is requesting a refill on this medication. Please advise.   [phone](Phone) [phone](Fax)

## 2017-01-01 ENCOUNTER — Other Ambulatory Visit: Payer: Self-pay | Admitting: Internal Medicine

## 2017-01-14 ENCOUNTER — Ambulatory Visit: Payer: Self-pay | Admitting: Internal Medicine

## 2017-01-14 DIAGNOSIS — M25561 Pain in right knee: Secondary | ICD-10-CM | POA: Diagnosis not present

## 2017-01-14 DIAGNOSIS — M25562 Pain in left knee: Secondary | ICD-10-CM | POA: Diagnosis not present

## 2017-01-14 DIAGNOSIS — M1711 Unilateral primary osteoarthritis, right knee: Secondary | ICD-10-CM | POA: Diagnosis not present

## 2017-01-14 DIAGNOSIS — M79672 Pain in left foot: Secondary | ICD-10-CM | POA: Diagnosis not present

## 2017-01-14 DIAGNOSIS — M1712 Unilateral primary osteoarthritis, left knee: Secondary | ICD-10-CM | POA: Diagnosis not present

## 2017-01-20 DIAGNOSIS — Z9884 Bariatric surgery status: Secondary | ICD-10-CM | POA: Diagnosis not present

## 2017-01-21 ENCOUNTER — Ambulatory Visit: Payer: Self-pay | Admitting: Internal Medicine

## 2017-02-16 ENCOUNTER — Encounter: Payer: Self-pay | Admitting: Internal Medicine

## 2017-02-16 ENCOUNTER — Ambulatory Visit (INDEPENDENT_AMBULATORY_CARE_PROVIDER_SITE_OTHER): Payer: Medicare Other | Admitting: Internal Medicine

## 2017-02-16 ENCOUNTER — Other Ambulatory Visit (INDEPENDENT_AMBULATORY_CARE_PROVIDER_SITE_OTHER): Payer: Medicare Other

## 2017-02-16 VITALS — BP 130/80 | HR 88 | Temp 97.9°F | Ht 65.0 in | Wt 223.0 lb

## 2017-02-16 DIAGNOSIS — F411 Generalized anxiety disorder: Secondary | ICD-10-CM | POA: Diagnosis not present

## 2017-02-16 DIAGNOSIS — N183 Chronic kidney disease, stage 3 unspecified: Secondary | ICD-10-CM

## 2017-02-16 DIAGNOSIS — K50118 Crohn's disease of large intestine with other complication: Secondary | ICD-10-CM | POA: Diagnosis not present

## 2017-02-16 DIAGNOSIS — R635 Abnormal weight gain: Secondary | ICD-10-CM | POA: Diagnosis not present

## 2017-02-16 DIAGNOSIS — I10 Essential (primary) hypertension: Secondary | ICD-10-CM | POA: Diagnosis not present

## 2017-02-16 DIAGNOSIS — E538 Deficiency of other specified B group vitamins: Secondary | ICD-10-CM

## 2017-02-16 DIAGNOSIS — Z0000 Encounter for general adult medical examination without abnormal findings: Secondary | ICD-10-CM | POA: Diagnosis not present

## 2017-02-16 DIAGNOSIS — Z1231 Encounter for screening mammogram for malignant neoplasm of breast: Secondary | ICD-10-CM

## 2017-02-16 DIAGNOSIS — G8929 Other chronic pain: Secondary | ICD-10-CM

## 2017-02-16 DIAGNOSIS — M5440 Lumbago with sciatica, unspecified side: Secondary | ICD-10-CM

## 2017-02-16 DIAGNOSIS — Z23 Encounter for immunization: Secondary | ICD-10-CM | POA: Diagnosis not present

## 2017-02-16 LAB — BASIC METABOLIC PANEL WITH GFR
BUN: 37 mg/dL — ABNORMAL HIGH (ref 6–23)
CO2: 25 meq/L (ref 19–32)
Calcium: 10.1 mg/dL (ref 8.4–10.5)
Chloride: 103 meq/L (ref 96–112)
Creatinine, Ser: 1.09 mg/dL (ref 0.40–1.20)
GFR: 65.54 mL/min (ref >60.00)
Glucose, Bld: 85 mg/dL (ref 70–99)
Potassium: 3.8 meq/L (ref 3.5–5.1)
Sodium: 141 meq/L (ref 135–145)

## 2017-02-16 MED ORDER — TRAMADOL-ACETAMINOPHEN 37.5-325 MG PO TABS: 1.0000 | ORAL_TABLET | Freq: Four times a day (QID) | ORAL | 3 refills | Status: DC | PRN

## 2017-02-16 NOTE — Assessment & Plan Note (Signed)
On Vit D 

## 2017-02-16 NOTE — Assessment & Plan Note (Signed)
Discussed diet  

## 2017-02-16 NOTE — Addendum Note (Signed)
Addended by: Emelia Loron A on: 02/16/2017 11:15 AM   Modules accepted: Orders, SmartSet

## 2017-02-16 NOTE — Progress Notes (Addendum)
Subjective:   Emily Sanchez is a 61 y.o. female who presents for Medicare Annual (Subsequent) preventive examination.  Review of Systems:  No ROS.  Medicare Wellness Visit. Additional risk factors are reflected in the social history.  Cardiac Risk Factors include: advanced age (>52mn, >>22women);obesity (BMI >30kg/m2);sedentary lifestyle Sleep patterns: gets up 1-2 times nightly to void and sleeps 5-6 hours nightly. Patient reports insomnia issues, discussed recommended sleep tips and stress reduction tips, education was attached to patient's AVS.   Home Safety/Smoke Alarms: Feels safe in home. Smoke alarms in place.  Living environment; residence and Firearm Safety: 1-story house/ trailer, no firearms Lives with husband, no needs for DME, good support system Seat Belt Safety/Bike Helmet: Wears seat belt.     Objective:     Vitals: BP 130/80 (BP Location: Left Arm, Patient Position: Sitting, Cuff Size: Large)   Pulse 88   Temp 97.9 F (36.6 C) (Oral)   Ht 5' 5"  (1.651 m)   Wt 223 lb (101.2 kg)   SpO2 99%   BMI 37.11 kg/m   Body mass index is 37.11 kg/m.   Tobacco History  Smoking Status  . Former Smoker  Smokeless Tobacco  . Never Used    Comment: very light social only-quit many years ago     Counseling given: Not Answered   Past Medical History:  Diagnosis Date  . Anxiety   . Chronic kidney disease    stage 3   . [place]   . Depression   . Enteritis due to Clostridium difficile 08/16/2012  . 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 GERD (gastroesophageal reflux disease)   . HTN (hypertension)   . Ileostomy in place ([facility]   . Low back pain    FMS  . Osteoarthritis    Dr OAlvan Dame both knees  . Perianal pain    Chronic, Post-Op  . Transfusion history    none recent  . [place]    Dr PSharlett Iles  Past Surgical History:  Procedure Laterality Date  . BREATH TEK H PYLORI N/A 12/05/2013   Procedure: BREATH TEK H PYLORI;   Surgeon: MPedro Earls MD;  [facility];  Service: General;  Laterality: N/A;  . COLECTOMY  2001   ulcerative colitis  . ILEOSTOMY    . LAPAROSCOPIC GASTRIC SLEEVE RESECTION WITH HIATAL HERNIA REPAIR N/A 04/02/2015   Procedure: LAPAROSCOPIC LYSIS OF ADHESIONS GASTRIC SLEEVE RESECTION WITH OPEN VENTRAL HERNIA REPAIR;  Surgeon: MJohnathan Hausen MD;  [facility];  Service: General;  Laterality: N/A;  . LAPAROSCOPIC LYSIS OF ADHESIONS  04/02/2015   Procedure: LAPAROSCOPIC LYSIS OF ADHESIONS;  Surgeon: MJohnathan Hausen MD;  [facility];  Service: General;;  . TONSILLECTOMY    . TUBAL LIGATION    . UPPER GI ENDOSCOPY  04/02/2015   Procedure: UPPER GI ENDOSCOPY;  Surgeon: MJohnathan Hausen MD;  [facility];  Service: General;;  . VENTRAL HERNIA REPAIR  04/02/2015   Procedure: HERNIA REPAIR VENTRAL ADULT;  Surgeon: MJohnathan Hausen MD;  [facility];  Service: General;;   Family History  Problem Relation Age of Onset  . Heart disease Father 865      CHF  . Depression Sister   . Hypertension Unknown    History  Sexual Activity  . Sexual activity: Yes    Outpatient Encounter Prescriptions as of 02/16/2017  Medication Sig  . BD INTEGRA SYRINGE 25G X 1" 3 ML MISC TO BE USED FOR B12  INJECTIONS  . cyanocobalamin (,VITAMIN B-12,) 1000 MCG/ML injection INJECT 1 ML (1,000 MCG TOTAL) INTO THE MUSCLE EVERY 14 (FOURTEEN) DAYS.  Marland Kitchen cyclobenzaprine (FLEXERIL) 5 MG tablet Take 1 tablet (5 mg total) by mouth at bedtime.  . ergocalciferol (VITAMIN D2) 50000 units capsule Take 1 capsule (50,000 Units total) by mouth every 30 (thirty) days.  . ferrous sulfate 325 (65 FE) MG tablet Take 1 tablet (325 mg total) by mouth 2 (two) times daily with a meal.  . hydrochlorothiazide (MICROZIDE) 12.5 MG capsule TAKE 1 CAPSULE (12.5 MG TOTAL) BY MOUTH DAILY.  . Milnacipran (SAVELLA) 50 MG TABS tablet Take 1 tablet (50 mg total) by mouth 2 (two) times daily.  . Multiple Vitamin (MULTIVITAMIN  WITH MINERALS) TABS tablet Take 1 tablet by mouth daily.  . pantoprazole (PROTONIX) 40 MG tablet Take 1 tablet (40 mg total) by mouth daily.  . SYRINGE-NEEDLE, DISP, 3 ML (B-D INTEGRA SYRINGE) 23G X 1" 3 ML MISC 1 Device by Does not apply route every 30 (thirty) days. For B12 shots  . traMADol-acetaminophen (ULTRACET) 37.5-325 MG tablet Take 1-2 tablets by mouth every 6 (six) hours as needed for severe pain.  . [DISCONTINUED] traMADol-acetaminophen (ULTRACET) 37.5-325 MG tablet Take 1-2 tablets by mouth every 6 (six) hours as needed for severe pain.   No facility-administered encounter medications on file as of 02/16/2017.     Activities of Daily Living In your present state of health, do you have any difficulty performing the following activities: 02/16/2017  Hearing? N  Vision? N  Difficulty concentrating or making decisions? N  Walking or climbing stairs? N  Dressing or bathing? N  Doing errands, shopping? N  Preparing Food and eating ? N  Using the Toilet? N  In the past six months, have you accidently leaked urine? N  Do you have problems with loss of bowel control? N  Managing your Medications? N  Managing your Finances? N  Housekeeping or managing your Housekeeping? N  Some recent data might be hidden    Patient Care Team: Plotnikov, Evie Lacks, MD as PCP - General Sable Feil, MD (Gastroenterology) Paralee Cancel, MD (Orthopedic Surgery)    Assessment:    Physical assessment deferred to PCP.  Exercise Activities and Dietary recommendations Current Exercise Habits: The patient does not participate in regular exercise at present (chair exercise pamphlet provided), Exercise limited by: orthopedic condition(s) Discussed joining [employer]  Diet (meal preparation, eat out, water intake, caffeinated beverages, dairy products, fruits and vegetables): in general, a "healthy" diet  , well balanced   Discussed weight loss tips, reading food labels, Diet education was  provided via handout. Relevant patient education assigned to patient using Emmi.  Goals    . Be more physically active          I want join [employer] and go 1-2 times weekly      Fall Risk Fall Risk  02/16/2017 05/29/2015 05/29/2015 04/17/2015 03/11/2015  Falls in the past year? Yes Yes Yes No No  Number falls in past yr: 1 - 1 - -  Injury with Fall? - - Yes - -  Follow up Falls prevention discussed - Falls evaluation completed - -   Depression Screen PHQ 2/9 Scores 02/16/2017 05/29/2015 04/17/2015 03/11/2015  PHQ - 2 Score 1 0 0 0  PHQ- 9 Score 4 - - -     Cognitive Function       Ad8 score reviewed for issues:  Issues making decisions: no  Less interest in hobbies / activities: no  Repeats questions, stories (family complaining): no  Trouble using ordinary gadgets (microwave, computer, phone):no  Forgets the month or year: no  Mismanaging finances: no  Remembering appts: no  Daily problems with thinking and/or memory: no Ad8 score is= 0  Immunization History  Administered Date(s) Administered  . Influenza Split 01/06/2011, 01/26/2012  . Influenza Whole 01/20/2008, 02/14/2009, 12/25/2009  . Influenza,inj,Quad PF,6+ Mos 02/16/2013, 02/14/2014, 02/27/2015, 12/10/2015, 02/16/2017  . Pneumococcal Conjugate-13 06/24/2016  . Pneumococcal Polysaccharide-23 05/15/2013  . Td 05/17/2014   Screening Tests Health Maintenance  Topic Date Due  . Hepatitis C Screening  23-Nov-1955  . HIV Screening  09/28/1970  . PAP SMEAR  09/27/1976  . COLONOSCOPY  09/27/2005  . MAMMOGRAM  12/07/2015  . INFLUENZA VACCINE  11/18/2016  . TETANUS/TDAP  05/17/2024      Plan:   Continue doing brain stimulating activities (puzzles, reading, adult coloring books, staying active) to keep memory sharp.   Continue to eat heart healthy diet (full of fruits, vegetables, whole grains, lean protein, water--limit salt, fat, and sugar intake) and increase physical activity as tolerated.  I  have personally reviewed and noted the following in the patient's chart:   . Medical and social history . Use of alcohol, tobacco or illicit drugs  . Current medications and supplements . Functional ability and status . Nutritional status . Physical activity . Advanced directives . List of other physicians . Vitals . Screenings to include cognitive, depression, and falls . Referrals and appointments  In addition, I have reviewed and discussed with patient certain preventive protocols, quality metrics, and best practice recommendations. A written personalized care plan for preventive services as well as general preventive health recommendations were provided to patient.     Michiel Cowboy, RN  02/16/2017  Medical screening examination/treatment/procedure(s) were performed by non-physician practitioner and as supervising physician I was immediately available for consultation/collaboration. I agree with above. Lew Dawes, MD
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
## 2017-02-16 NOTE — Addendum Note (Signed)
Addended by: Karren Cobble on: 02/16/2017 10:01 AM   Modules accepted: Orders

## 2017-02-16 NOTE — Patient Instructions (Addendum)
Continue doing brain stimulating activities (puzzles, reading, adult coloring books, staying active) to keep memory sharp.   Continue to eat heart healthy diet (full of fruits, vegetables, whole grains, lean protein, water--limit salt, fat, and sugar intake) and increase physical activity as tolerated.    Emily Sanchez , Thank you for taking time to come for your Medicare Wellness Visit. I appreciate your ongoing commitment to your health goals. Please review the following plan we discussed and let me know if I can assist you in the future.   These are the goals we discussed: Goals    . Be more physically active          I want join [employer] and go 1-2 times weekly       This is a list of the screening recommended for you and due dates:  Health Maintenance  Topic Date Due  .  Hepatitis C: One time screening is recommended by Center for Disease Control  (CDC) for  adults born from [birth year] through 1965.   [birth year]  . HIV Screening  09/28/1970  . Pap Smear  09/27/1976  . Colon Cancer Screening  09/27/2005  . Mammogram  12/07/2015  . Flu Shot  11/18/2016  . Tetanus Vaccine  05/17/2024     Diphenhydramine capsules or tablets [Insomnia] What is this medicine? DIPHENHYDRAMINE (dye fen HYE dra meen) is an antihistamine. This medicine is used to treat occasional sleeplesness. This medicine may be used for other purposes; ask your health care provider or pharmacist if you have questions. COMMON BRAND NAME(S): Aid to Sleep, Compoz Nighttime Sleep Aid, Nighttime Sleep Aid, Nytol, Simply Sleep, Sleep Tabs, Sominex, Unisom, Vicks Qlearquil Nighttime Allergy Relief, Vicks ZzzQuil Nightime Sleep-Aid What should I tell my health care provider before I take this medicine? They need to know if you have any of these conditions: -glaucoma -high blood pressure or heart disease -liver disease -lung or breathing disease, like asthma -pain or trouble passing urine -prostate trouble -ulcers or  other stomach problems -an unusual or allergic reaction to diphenhydramine, other medicines foods, dyes, or preservatives such as sulfites -pregnant or trying to get pregnant -breast-feeding How should I use this medicine? Take this medicine by mouth with a full glass of water. Follow the directions on the prescription label. Do not take your medicine more often than directed. Talk to your pediatrician regarding the use of this medicine in children. While this drug may be prescribed for children as young as 5 years old for selected conditions, precautions do apply. Patients over 71 years old may have a stronger reaction and need a smaller dose. Overdosage: If you think you have taken too much of this medicine contact a poison control center or emergency room at once. NOTE: This medicine is only for you. Do not share this medicine with others. What if I miss a dose? If you miss a dose, take it as soon as you can. If it is almost time for your next dose, take only that dose. Do not take double or extra doses. What may interact with this medicine? Do not take this medicine with any of the following medications: -MAOIs like Carbex, Eldepryl, Marplan, Nardil, and Parnate This medicine may also interact with the following medications: -alcohol -barbiturates like phenobarbital -medicines for bladder spasm like oxybutynin, tolterodine -medicines for blood pressure -medicines for depression, anxiety, or psychotic disturbances -medicines for movement abnormalities or Parkinson's disease -medicines for sleep -other medicines for cold, cough, or allergy -some medicines for the  stomach like chlordiazepoxide, dicyclomine This list may not describe all possible interactions. Give your health care provider a list of all the medicines, herbs, non-prescription drugs, or dietary supplements you use. Also tell them if you smoke, drink alcohol, or use illegal drugs. Some items may interact with your  medicine. What should I watch for while using this medicine? Visit your doctor or health care professional for regular check ups. Tell your doctor or health care professional if your symptoms do not start to get better or if they get worse. Your mouth may get dry. Chewing sugarless gum or sucking hard candy, and drinking plenty of water may help. Contact your doctor if the problem does not go away or is severe. This medicine may cause dry eyes and blurred vision. If you wear contact lenses you may feel some discomfort. Lubricating drops may help. See your eye doctor if the problem does not go away or is severe. You may get drowsy or dizzy. Do not drive, use machinery, or do anything that needs mental alertness until you know how this medicine affects you. Do not stand or sit up quickly, especially if you are an older patient. This reduces the risk of dizzy or fainting spells. Alcohol may interfere with the effect of this medicine. Avoid alcoholic drinks. What side effects may I notice from receiving this medicine? Side effects that you should report to your doctor or health care professional as soon as possible: -allergic reactions like skin rash, itching or hives, swelling of the face, lips, or tongue -changes in vision -confused, agitated, or nervous -fast, irregular heartbeat -tremor -trouble passing urine or change in the amount of urine -unusual bleeding or bruising -unusually weak or tired Side effects that usually do not require medical attention (report to your doctor or health care professional if they continue or are bothersome): -constipation, diarrhea -drowsy -headache -loss of appetite -stomach upset, vomiting -thick mucus This list may not describe all possible side effects. Call your doctor for medical advice about side effects. You may report side effects to FDA at 1-800-FDA-1088. Where should I keep my medicine? Keep out of the reach of children. Store at room temperature  between 20 and 25 degrees C (68 and 77 degrees F). Keep container closed tightly. Throw away any unused medicine after the expiration date. NOTE: This sheet is a summary. It may not cover all possible information. If you have questions about this medicine, talk to your doctor, pharmacist, or health care provider.  2018 Elsevier/Gold Standard (2007-08-05 16:14:00) Insomnia Insomnia is a sleep disorder that makes it difficult to fall asleep or to stay asleep. Insomnia can cause tiredness (fatigue), low energy, difficulty concentrating, mood swings, and poor performance at work or school. There are three different ways to classify insomnia:  Difficulty falling asleep.  Difficulty staying asleep.  Waking up too early in the morning.  Any type of insomnia can be long-term (chronic) or short-term (acute). Both are common. Short-term insomnia usually lasts for three months or less. Chronic insomnia occurs at least three times a week for longer than three months. What are the causes? Insomnia may be caused by another condition, situation, or substance, such as:  Anxiety.  Certain medicines.  Gastroesophageal reflux disease (GERD) or other gastrointestinal conditions.  Asthma or other breathing conditions.  Restless legs syndrome, sleep apnea, or other sleep disorders.  Chronic pain.  Menopause. This may include hot flashes.  Stroke.  Abuse of alcohol, tobacco, or illegal drugs.  Depression.  Caffeine.  Neurological disorders, such as Alzheimer disease.  An overactive thyroid (hyperthyroidism).  The cause of insomnia may not be known. What increases the risk? Risk factors for insomnia include:  Gender. Women are more commonly affected than men.  Age. Insomnia is more common as you get older.  Stress. This may involve your professional or personal life.  Income. Insomnia is more common in people with lower income.  Lack of exercise.  Irregular work schedule or night  shifts.  Traveling between different time zones.  What are the signs or symptoms? If you have insomnia, trouble falling asleep or trouble staying asleep is the main symptom. This may lead to other symptoms, such as:  Feeling fatigued.  Feeling nervous about going to sleep.  Not feeling rested in the morning.  Having trouble concentrating.  Feeling irritable, anxious, or depressed.  How is this treated? Treatment for insomnia depends on the cause. If your insomnia is caused by an underlying condition, treatment will focus on addressing the condition. Treatment may also include:  Medicines to help you sleep.  Counseling or therapy.  Lifestyle adjustments.  Follow these instructions at home:  Take medicines only as directed by your health care provider.  Keep regular sleeping and waking hours. Avoid naps.  Keep a sleep diary to help you and your health care provider figure out what could be causing your insomnia. Include: ? When you sleep. ? When you wake up during the night. ? How well you sleep. ? How rested you feel the next day. ? Any side effects of medicines you are taking. ? What you eat and drink.  Make your bedroom a comfortable place where it is easy to fall asleep: ? Put up shades or special blackout curtains to block light from outside. ? Use a white noise machine to block noise. ? Keep the temperature cool.  Exercise regularly as directed by your health care provider. Avoid exercising right before bedtime.  Use relaxation techniques to manage stress. Ask your health care provider to suggest some techniques that may work well for you. These may include: ? Breathing exercises. ? Routines to release muscle tension. ? Visualizing peaceful scenes.  Cut back on alcohol, caffeinated beverages, and cigarettes, especially close to bedtime. These can disrupt your sleep.  Do not overeat or eat spicy foods right before bedtime. This can lead to digestive discomfort  that can make it hard for you to sleep.  Limit screen use before bedtime. This includes: ? Watching TV. ? Using your smartphone, tablet, and computer.  Stick to a routine. This can help you fall asleep faster. Try to do a quiet activity, brush your teeth, and go to bed at the same time each night.  Get out of bed if you are still awake after 15 minutes of trying to sleep. Keep the lights down, but try reading or doing a quiet activity. When you feel sleepy, go back to bed.  Make sure that you drive carefully. Avoid driving if you feel very sleepy.  Keep all follow-up appointments as directed by your health care provider. This is important. Contact a health care provider if:  You are tired throughout the day or have trouble in your daily routine due to sleepiness.  You continue to have sleep problems or your sleep problems get worse. Get help right away if:  You have serious thoughts about hurting yourself or someone else. This information is not intended to replace advice given to you by your health care provider. Make  sure you discuss any questions you have with your health care provider. Document Released: 04/03/2000 Document Revised: 09/06/2015 Document Reviewed: 01/05/2014 Elsevier Interactive Patient Education  [employer].

## 2017-02-16 NOTE — Assessment & Plan Note (Signed)
Flexeril 5 mg at hs  Tramadol - APAP 6/18 Start Savella -  Potential benefits of a long term savella use as well as potential risks  and complications were explained to the patient and were aknowledged.

## 2017-02-16 NOTE — Progress Notes (Signed)
Pre visit review using our clinic review tool, if applicable. No additional management support is needed unless otherwise documented below in the visit note. 

## 2017-02-16 NOTE — Assessment & Plan Note (Signed)
Chronic  Potential benefits of a long term benzodiazepines  use as well as potential risks  and complications were explained to the patient and were aknowledged.

## 2017-02-16 NOTE — Assessment & Plan Note (Signed)
Colostomy

## 2017-02-16 NOTE — Assessment & Plan Note (Signed)
Labs

## 2017-02-16 NOTE — Assessment & Plan Note (Signed)
BP Readings from Last 3 Encounters:  02/16/17 130/80  10/13/16 (!) 142/86  06/24/16 108/78

## 2017-02-16 NOTE — Assessment & Plan Note (Signed)
On B12 

## 2017-02-25 ENCOUNTER — Telehealth: Payer: Self-pay

## 2017-02-25 NOTE — Telephone Encounter (Signed)
Pt sates that here lately she has been having a nervous feeling and would like to know if there is anything she can take.  Or does pt need appointment?

## 2017-02-25 NOTE — Telephone Encounter (Signed)
Try OTC Valerian root prn OV if not better Thx

## 2017-03-02 ENCOUNTER — Other Ambulatory Visit: Payer: Self-pay | Admitting: Internal Medicine

## 2017-03-02 DIAGNOSIS — Z1231 Encounter for screening mammogram for malignant neoplasm of breast: Secondary | ICD-10-CM

## 2017-03-04 NOTE — Telephone Encounter (Signed)
Pt.notified

## 2017-03-31 ENCOUNTER — Ambulatory Visit
Admission: RE | Admit: 2017-03-31 | Discharge: 2017-03-31 | Disposition: A | Discharge status: Home / Self Care | Payer: Medicare Other | Source: Ambulatory Visit | Attending: Internal Medicine | Admitting: Internal Medicine

## 2017-03-31 DIAGNOSIS — Z1231 Encounter for screening mammogram for malignant neoplasm of breast: Secondary | ICD-10-CM

## 2017-04-16 ENCOUNTER — Telehealth: Payer: Self-pay | Admitting: Internal Medicine

## 2017-04-16 NOTE — Telephone Encounter (Signed)
Copied from [place] [id number]. Topic: Quick Communication - See Telephone Encounter >> Apr 16, 2017  3:23 PM Cleaster Corin, NT wrote: CRM for notification. See Telephone encounter for:   04/16/17. Xernan (conformedical) Calling to see if Fax was received for pt. (order form for ostomy supplies) fax was sent 12-20- 18 and 12-26 -18 xernan can be reached at  [phone]

## 2017-04-22 ENCOUNTER — Other Ambulatory Visit: Payer: Self-pay | Admitting: Internal Medicine

## 2017-04-22 NOTE — Telephone Encounter (Signed)
faxed

## 2017-05-25 ENCOUNTER — Ambulatory Visit: Payer: Self-pay | Admitting: Internal Medicine

## 2017-05-26 DIAGNOSIS — M170 Bilateral primary osteoarthritis of knee: Secondary | ICD-10-CM | POA: Diagnosis not present

## 2017-06-07 ENCOUNTER — Encounter (HOSPITAL_COMMUNITY): Payer: Self-pay | Admitting: Emergency Medicine

## 2017-06-07 ENCOUNTER — Ambulatory Visit: Payer: Self-pay | Admitting: *Deleted

## 2017-06-07 ENCOUNTER — Encounter: Payer: Self-pay | Admitting: Internal Medicine

## 2017-06-07 ENCOUNTER — Ambulatory Visit (INDEPENDENT_AMBULATORY_CARE_PROVIDER_SITE_OTHER): Payer: Medicare Other | Admitting: Internal Medicine

## 2017-06-07 DIAGNOSIS — K5090 Crohn's disease, unspecified, without complications: Secondary | ICD-10-CM | POA: Diagnosis not present

## 2017-06-07 DIAGNOSIS — E860 Dehydration: Secondary | ICD-10-CM

## 2017-06-07 DIAGNOSIS — Z888 Allergy status to other drugs, medicaments and biological substances status: Secondary | ICD-10-CM

## 2017-06-07 DIAGNOSIS — K219 Gastro-esophageal reflux disease without esophagitis: Secondary | ICD-10-CM | POA: Diagnosis not present

## 2017-06-07 DIAGNOSIS — E876 Hypokalemia: Secondary | ICD-10-CM | POA: Diagnosis present

## 2017-06-07 DIAGNOSIS — K56699 Other intestinal obstruction unspecified as to partial versus complete obstruction: Secondary | ICD-10-CM | POA: Diagnosis not present

## 2017-06-07 DIAGNOSIS — Z881 Allergy status to other antibiotic agents status: Secondary | ICD-10-CM

## 2017-06-07 DIAGNOSIS — I129 Hypertensive chronic kidney disease with stage 1 through stage 4 chronic kidney disease, or unspecified chronic kidney disease: Secondary | ICD-10-CM | POA: Diagnosis not present

## 2017-06-07 DIAGNOSIS — R109 Unspecified abdominal pain: Secondary | ICD-10-CM | POA: Insufficient documentation

## 2017-06-07 DIAGNOSIS — D649 Anemia, unspecified: Secondary | ICD-10-CM | POA: Diagnosis present

## 2017-06-07 DIAGNOSIS — R1084 Generalized abdominal pain: Secondary | ICD-10-CM | POA: Diagnosis not present

## 2017-06-07 DIAGNOSIS — F329 Major depressive disorder, single episode, unspecified: Secondary | ICD-10-CM | POA: Diagnosis present

## 2017-06-07 DIAGNOSIS — M797 Fibromyalgia: Secondary | ICD-10-CM | POA: Diagnosis present

## 2017-06-07 DIAGNOSIS — K433 Parastomal hernia with obstruction, without gangrene: Secondary | ICD-10-CM | POA: Diagnosis not present

## 2017-06-07 DIAGNOSIS — R112 Nausea with vomiting, unspecified: Secondary | ICD-10-CM

## 2017-06-07 DIAGNOSIS — N183 Chronic kidney disease, stage 3 (moderate): Secondary | ICD-10-CM | POA: Diagnosis not present

## 2017-06-07 DIAGNOSIS — F419 Anxiety disorder, unspecified: Secondary | ICD-10-CM | POA: Diagnosis present

## 2017-06-07 DIAGNOSIS — Z932 Ileostomy status: Secondary | ICD-10-CM

## 2017-06-07 DIAGNOSIS — N179 Acute kidney failure, unspecified: Secondary | ICD-10-CM | POA: Diagnosis not present

## 2017-06-07 DIAGNOSIS — K660 Peritoneal adhesions (postprocedural) (postinfection): Secondary | ICD-10-CM | POA: Diagnosis present

## 2017-06-07 DIAGNOSIS — Z8249 Family history of ischemic heart disease and other diseases of the circulatory system: Secondary | ICD-10-CM

## 2017-06-07 DIAGNOSIS — D72829 Elevated white blood cell count, unspecified: Secondary | ICD-10-CM | POA: Diagnosis not present

## 2017-06-07 DIAGNOSIS — K56609 Unspecified intestinal obstruction, unspecified as to partial versus complete obstruction: Secondary | ICD-10-CM | POA: Diagnosis not present

## 2017-06-07 DIAGNOSIS — Z818 Family history of other mental and behavioral disorders: Secondary | ICD-10-CM

## 2017-06-07 DIAGNOSIS — Z87891 Personal history of nicotine dependence: Secondary | ICD-10-CM

## 2017-06-07 DIAGNOSIS — E162 Hypoglycemia, unspecified: Secondary | ICD-10-CM | POA: Diagnosis not present

## 2017-06-07 DIAGNOSIS — Z885 Allergy status to narcotic agent status: Secondary | ICD-10-CM

## 2017-06-07 DIAGNOSIS — Z9049 Acquired absence of other specified parts of digestive tract: Secondary | ICD-10-CM

## 2017-06-07 DIAGNOSIS — Z9884 Bariatric surgery status: Secondary | ICD-10-CM

## 2017-06-07 LAB — URINALYSIS, ROUTINE W REFLEX MICROSCOPIC
Bacteria, UA: NONE SEEN
Bilirubin Urine: NEGATIVE
GLUCOSE, UA: NEGATIVE mg/dL
Hgb urine dipstick: NEGATIVE
Ketones, ur: NEGATIVE mg/dL
NITRITE: NEGATIVE
Protein, ur: 100 mg/dL — AB
SPECIFIC GRAVITY, URINE: 1.026 (ref 1.005–1.030)
pH: 5 (ref 5.0–8.0)

## 2017-06-07 LAB — COMPREHENSIVE METABOLIC PANEL
ALBUMIN: 4.3 g/dL (ref 3.5–5.0)
ALK PHOS: 79 U/L (ref 38–126)
ALT: 24 U/L (ref 14–54)
ANION GAP: 14 (ref 5–15)
AST: 33 U/L (ref 15–41)
BILIRUBIN TOTAL: 0.7 mg/dL (ref 0.3–1.2)
BUN: 34 mg/dL — ABNORMAL HIGH (ref 6–20)
CALCIUM: 10.2 mg/dL (ref 8.9–10.3)
CO2: 25 mmol/L (ref 22–32)
Chloride: 102 mmol/L (ref 101–111)
Creatinine, Ser: 1.31 mg/dL — ABNORMAL HIGH (ref 0.44–1.00)
GFR calc Af Amer: 50 mL/min — ABNORMAL LOW (ref >60)
GFR, EST NON AFRICAN AMERICAN: 43 mL/min — AB (ref >60)
GLUCOSE: 101 mg/dL — AB (ref 65–99)
Potassium: 3.2 mmol/L — ABNORMAL LOW (ref 3.5–5.1)
Sodium: 141 mmol/L (ref 135–145)
TOTAL PROTEIN: 8.7 g/dL — AB (ref 6.5–8.1)

## 2017-06-07 LAB — LIPASE, BLOOD: Lipase: 52 U/L — ABNORMAL HIGH (ref 11–51)

## 2017-06-07 LAB — CBC
HCT: 44.1 % (ref 36.0–46.0)
Hemoglobin: 14.6 g/dL (ref 12.0–15.0)
MCH: 30.9 pg (ref 26.0–34.0)
MCHC: 33.1 g/dL (ref 30.0–36.0)
MCV: 93.4 fL (ref 78.0–100.0)
Platelets: 264 10*3/uL (ref 150–400)
RBC: 4.72 MIL/uL (ref 3.87–5.11)
RDW: 14.4 % (ref 11.5–15.5)
WBC: 8.1 10*3/uL (ref 4.0–10.5)

## 2017-06-07 MED ORDER — KETOROLAC TROMETHAMINE 30 MG/ML IJ SOLN
30.0000 mg | Freq: Once | INTRAMUSCULAR | Status: AC
  Administered 2017-06-07: 30 mg via INTRAMUSCULAR

## 2017-06-07 MED ORDER — ONDANSETRON HCL 4 MG/2ML IJ SOLN
4.0000 mg | Freq: Once | INTRAMUSCULAR | Status: AC
  Administered 2017-06-07: 4 mg via INTRAMUSCULAR

## 2017-06-07 NOTE — ED Triage Notes (Signed)
Patient c/o abdominal pain and emesis since yesterday. Hx colitis and Chron's disease. Patient has an ostomy. Seen at PCP this morning and sent for further evaluation to r/o SBO.

## 2017-06-07 NOTE — Addendum Note (Signed)
Addended by: Karren Cobble on: 06/07/2017 03:29 PM   Modules accepted: Orders

## 2017-06-07 NOTE — Progress Notes (Addendum)
Subjective:  Patient ID: Emily Sanchez, female    DOB: [birth year]  Age: 62 y.o. MRN: [id number]  CC: No chief complaint on file.   HPI Emily Sanchez presents for n/v/d since last night, abd pain - did not sleep... H/o Crohn's . Pain is 10/10 on the R. Unable to eat or drink.Marland KitchenMarland KitchenMarland KitchenNothing helped. Urine is ok. She is getting weaker. H/o ARF due to dehydration...   Past Medical History:  Diagnosis Date  . Anxiety   . Chronic kidney disease    stage 3   . Crohn's disease ([place])   . Depression   . Enteritis due to Clostridium difficile 08/16/2012  . 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 GERD (gastroesophageal reflux disease)   . HTN (hypertension)   . Ileostomy in place [facility])   . Low back pain    FMS  . Osteoarthritis    Dr Alvan Dame; both knees  . Perianal pain    Chronic, Post-Op  . Transfusion history    none recent  . Ulcerative colitis ([place])    Dr Sharlett Iles   Past Surgical History:  Procedure Laterality Date  . BREATH TEK H PYLORI N/A 12/05/2013   Procedure: BREATH TEK H PYLORI;  Surgeon: Pedro Earls, MD;  Location: [facility];  Service: General;  Laterality: N/A;  . COLECTOMY  2001   ulcerative colitis  . ILEOSTOMY    . LAPAROSCOPIC GASTRIC SLEEVE RESECTION WITH HIATAL HERNIA REPAIR N/A 04/02/2015   Procedure: LAPAROSCOPIC LYSIS OF ADHESIONS GASTRIC SLEEVE RESECTION WITH OPEN VENTRAL HERNIA REPAIR;  Surgeon: Johnathan Hausen, MD;  Location: [facility];  Service: General;  Laterality: N/A;  . LAPAROSCOPIC LYSIS OF ADHESIONS  04/02/2015   Procedure: LAPAROSCOPIC LYSIS OF ADHESIONS;  Surgeon: Johnathan Hausen, MD;  Location: [facility];  Service: General;;  . TONSILLECTOMY    . TUBAL LIGATION    . UPPER GI ENDOSCOPY  04/02/2015   Procedure: UPPER GI ENDOSCOPY;  Surgeon: Johnathan Hausen, MD;  Location: [facility];  Service: General;;  . VENTRAL HERNIA REPAIR  04/02/2015   Procedure: HERNIA REPAIR VENTRAL ADULT;  Surgeon: Johnathan Hausen, MD;  Location: [facility];  Service: General;;    reports that she has quit smoking. she has never used smokeless tobacco. She reports that she does not drink alcohol or use drugs. family history includes Depression in her sister; Heart disease (age of onset: 82) in her father; Hypertension in her unknown relative. Allergies  Allergen Reactions  . Amphetamine-Dextroamphet Er Nausea And Vomiting  . Cymbalta [Duloxetine Hcl] Other (See Comments)    "Made me sleepy."  . Erythromycin Ethylsuccinate Nausea And Vomiting  . Morphine Nausea And Vomiting     Outpatient Medications Prior to Visit  Medication Sig Dispense Refill  . BD INTEGRA SYRINGE 25G X 1" 3 ML MISC TO BE USED FOR B12 INJECTIONS 50 each 3  . cyanocobalamin (,VITAMIN B-12,) 1000 MCG/ML injection INJECT 1 ML (1,000 MCG TOTAL) INTO THE MUSCLE EVERY 14 (FOURTEEN) DAYS. 20 mL 2  . cyclobenzaprine (FLEXERIL) 5 MG tablet Take 1 tablet (5 mg total) by mouth at bedtime. 30 tablet 2  . hydrochlorothiazide (MICROZIDE) 12.5 MG capsule TAKE 1 CAPSULE (12.5 MG TOTAL) BY MOUTH DAILY. 90 capsule 1  . Milnacipran (SAVELLA) 50 MG TABS tablet Take 1 tablet (50 mg total) by mouth 2 (two) times daily. 60 tablet 5  . Multiple Vitamin (MULTIVITAMIN WITH MINERALS) TABS tablet Take 1 tablet by mouth daily.    . pantoprazole (PROTONIX) 40 MG tablet Take 1  tablet (40 mg total) by mouth daily. 90 tablet 3  . SYRINGE-NEEDLE, DISP, 3 ML (B-D INTEGRA SYRINGE) 23G X 1" 3 ML MISC 1 Device by Does not apply route every 30 (thirty) days. For B12 shots 50 each 3  . traMADol-acetaminophen (ULTRACET) 37.5-325 MG tablet Take 1-2 tablets by mouth every 6 (six) hours as needed for severe pain. 120 tablet 3  . ferrous sulfate 325 (65 FE) MG tablet Take 1 tablet (325 mg total) by mouth 2 (two) times daily with a meal. 60 tablet 6   No facility-administered medications prior to visit.     ROS Review of Systems  Constitutional: Positive for chills and fatigue. Negative for activity change, appetite change and unexpected  weight change.  HENT: Negative for congestion, mouth sores and sinus pressure.   Eyes: Negative for visual disturbance.  Respiratory: Negative for cough and chest tightness.   Gastrointestinal: Positive for abdominal pain, diarrhea, nausea and vomiting. Negative for blood in stool.  Genitourinary: Negative for difficulty urinating, frequency and vaginal pain.  Musculoskeletal: Negative for back pain and gait problem.  Skin: Negative for pallor and rash.  Neurological: Negative for dizziness, tremors, weakness, numbness and headaches.  Psychiatric/Behavioral: Negative for confusion and sleep disturbance.    Objective:  BP 134/86 (BP Location: Right Arm, Patient Position: Sitting, Cuff Size: Large)   Pulse 86   Temp 98.7 F (37.1 C) (Oral)   Ht 5' 5"  (1.651 m)   Wt 221 lb (100.2 kg)   SpO2 99%   BMI 36.78 kg/m   BP Readings from Last 3 Encounters:  06/07/17 134/86  02/16/17 130/80  10/13/16 (!) 142/86    Wt Readings from Last 3 Encounters:  06/07/17 221 lb (100.2 kg)  02/16/17 223 lb (101.2 kg)  10/13/16 216 lb (98 kg)    Physical Exam  Constitutional: She appears well-developed. No distress.  HENT:  Head: Normocephalic.  Right Ear: External ear normal.  Left Ear: External ear normal.  Nose: Nose normal.  Mouth/Throat: Oropharynx is clear and moist.  Eyes: Conjunctivae are normal. Pupils are equal, round, and reactive to light. Right eye exhibits no discharge. Left eye exhibits no discharge.  Neck: Normal range of motion. Neck supple. No JVD present. No tracheal deviation present. No thyromegaly present.  Cardiovascular: Normal rate, regular rhythm and normal heart sounds.  Pulmonary/Chest: No stridor. No respiratory distress. She has no wheezes.  Abdominal: Soft. Bowel sounds are normal. She exhibits no distension and no mass. There is tenderness. There is no rebound and no guarding.  Musculoskeletal: She exhibits tenderness. She exhibits no edema.  Lymphadenopathy:     She has no cervical adenopathy.  Neurological: She displays normal reflexes. No cranial nerve deficit. She exhibits normal muscle tone. Coordination normal.  Skin: No rash noted. No erythema.  Psychiatric: She has a normal mood and affect. Her behavior is normal. Judgment and thought content normal.  Looks tired colostomy bag on the R is empty There is a grapefruit size painful swelling near the colostomy opening  Lab Results  Component Value Date   WBC 6.7 06/24/2016   HGB 12.7 06/24/2016   HCT 38.9 06/24/2016   PLT 275.0 06/24/2016   GLUCOSE 85 02/16/2017   CHOL 172 05/27/2010   TRIG 100.0 05/27/2010   HDL 46.30 05/27/2010   LDLCALC 106 (H) 05/27/2010   ALT 143 (H) 08/30/2015   AST 271 (H) 08/30/2015   NA 141 02/16/2017   K 3.8 02/16/2017   CL 103 02/16/2017  CREATININE 1.09 02/16/2017   BUN 37 (H) 02/16/2017   CO2 25 02/16/2017   TSH 0.54 02/21/2016   INR 1.20 04/04/2015   HGBA1C 5.9 12/17/2009   MICROALBUR 0.6 02/10/2013    Mm Screening Breast Tomo Bilateral  Result Date: 03/31/2017 CLINICAL DATA:  Screening. EXAM: 2D DIGITAL SCREENING BILATERAL MAMMOGRAM WITH CAD AND ADJUNCT TOMO COMPARISON:  Previous exam(s). ACR Breast Density Category b: There are scattered areas of fibroglandular density. FINDINGS: There are no findings suspicious for malignancy. Images were processed with CAD. IMPRESSION: No mammographic evidence of malignancy. A result letter of this screening mammogram will be mailed directly to the patient. RECOMMENDATION: Screening mammogram in one year. (Code:SM-B-01Y) BI-RADS CATEGORY  1: Negative. Electronically Signed   By: Dorise Bullion III M.D   On: 03/31/2017 12:02    Assessment & Plan:   There are no diagnoses linked to this encounter. I am having Emily Sanchez maintain her multivitamin with minerals, SYRINGE-NEEDLE (DISP) 3 ML, BD INTEGRA SYRINGE, ferrous sulfate, pantoprazole, Milnacipran, cyclobenzaprine, cyanocobalamin, traMADol-acetaminophen,  and hydrochlorothiazide.  No orders of the defined types were placed in this encounter.    Follow-up: No Follow-up on file.  Walker Kehr, MD

## 2017-06-07 NOTE — Assessment & Plan Note (Signed)
Possible SBO due to adhesions, ?Crohn's Zofran IM She will go to [facility] ER for IVF, CT etc

## 2017-06-07 NOTE — Telephone Encounter (Signed)
Pt has  A  History  Of  Crohn's  Disease   She  Has  Pain  r  Side   With  Some  Nausea   /   Vomiting  . Pt   Advised  To  Remain npo  Till  She  Sees  The  Provider  Today.   Reason for Disposition . [1] MODERATE pain (e.g., interferes with normal activities) AND [2] pain comes and goes (cramps) AND [3] present > 24 hours  (Exception: pain with Vomiting or Diarrhea - see that Guideline)  Answer Assessment - Initial Assessment Questions 1. LOCATION: "Where does it hurt?"       R SIDED  LOW  ABD  PAIN   TENDER  TO THE  TOUCH   2. RADIATION: "Does the pain shoot anywhere else?" (e.g., chest, back)       AROUND  R  SIDE  OF  ABD    3. ONSET: "When did the pain begin?" (e.g., minutes, hours or days ago)        2  DAYS   AGO   4. SUDDEN: "Gradual or sudden onset?"      GRADUALLY  WORSE  GOT  BAD  YESTERDAY   5. PATTERN "Does the pain come and go, or is it constant?"    - If constant: "Is it getting better, staying the same, or worsening?"      (Note: Constant means the pain never goes away completely; most serious pain is constant and it progresses)     - If intermittent: "How long does it last?" "Do you have pain now?"     (Note: Intermittent means the pain goes away completely between bouts)       Constant   6. SEVERITY: "How bad is the pain?"  (e.g., Scale 1-10; mild, moderate, or severe)   - MILD (1-3): doesn't interfere with normal activities, abdomen soft and not tender to touch    - MODERATE (4-7): interferes with normal activities or awakens from sleep, tender to touch    - SEVERE (8-10): excruciating pain, doubled over, unable to do any normal activities      10 7. RECURRENT SYMPTOM: "Have you ever had this type of abdominal pain before?" If so, ask: "When was the last time?" and "What happened that time?"       Yes    About  1  Year  Ago  8. CAUSE: "What do you think is causing the abdominal pain?"     Chrones   9. RELIEVING/AGGRAVATING FACTORS: "What makes it better or worse?"  (e.g., movement, antacids, bowel movement)       Worse  On palaption   And   Walking   10. OTHER SYMPTOMS: "Has there been any vomiting, diarrhea, constipation, or urine problems?"        Nausea  And  Vomiting   11. PREGNANCY: "Is there any chance you are pregnant?" "When was your last menstrual period?"       N/a  Protocols used: ABDOMINAL PAIN - [facility]

## 2017-06-07 NOTE — Assessment & Plan Note (Signed)
H/o ARF caused by n/v/d in the past  Zofran IM She will go to [facility] ER for IVF

## 2017-06-07 NOTE — Assessment & Plan Note (Signed)
R>L Probable SBO due to adhesions, ?Crohn's vs other Zofran IM, Toradol IM She will go to [facility] ER for IVF, CT etc now

## 2017-06-08 ENCOUNTER — Observation Stay (HOSPITAL_COMMUNITY): Payer: Medicare Other

## 2017-06-08 ENCOUNTER — Emergency Department (HOSPITAL_COMMUNITY): Payer: Medicare Other

## 2017-06-08 ENCOUNTER — Inpatient Hospital Stay (HOSPITAL_COMMUNITY)
Admission: EM | Admit: 2017-06-08 | Discharge: 2017-06-16 | DRG: 354 | Disposition: A | Discharge status: Home / Self Care | Payer: Medicare Other | Attending: Family Medicine | Admitting: Family Medicine

## 2017-06-08 ENCOUNTER — Ambulatory Visit: Payer: Self-pay | Admitting: Internal Medicine

## 2017-06-08 ENCOUNTER — Encounter (HOSPITAL_COMMUNITY): Payer: Self-pay

## 2017-06-08 DIAGNOSIS — K56609 Unspecified intestinal obstruction, unspecified as to partial versus complete obstruction: Secondary | ICD-10-CM

## 2017-06-08 DIAGNOSIS — Z0189 Encounter for other specified special examinations: Secondary | ICD-10-CM

## 2017-06-08 DIAGNOSIS — D649 Anemia, unspecified: Secondary | ICD-10-CM | POA: Diagnosis present

## 2017-06-08 DIAGNOSIS — R112 Nausea with vomiting, unspecified: Secondary | ICD-10-CM | POA: Diagnosis not present

## 2017-06-08 DIAGNOSIS — K5669 Other partial intestinal obstruction: Secondary | ICD-10-CM | POA: Diagnosis not present

## 2017-06-08 DIAGNOSIS — E162 Hypoglycemia, unspecified: Secondary | ICD-10-CM | POA: Diagnosis not present

## 2017-06-08 DIAGNOSIS — E860 Dehydration: Secondary | ICD-10-CM | POA: Diagnosis not present

## 2017-06-08 DIAGNOSIS — R140 Abdominal distension (gaseous): Secondary | ICD-10-CM | POA: Diagnosis not present

## 2017-06-08 DIAGNOSIS — Z881 Allergy status to other antibiotic agents status: Secondary | ICD-10-CM | POA: Diagnosis not present

## 2017-06-08 DIAGNOSIS — Z9049 Acquired absence of other specified parts of digestive tract: Secondary | ICD-10-CM | POA: Diagnosis not present

## 2017-06-08 DIAGNOSIS — Z87891 Personal history of nicotine dependence: Secondary | ICD-10-CM | POA: Diagnosis not present

## 2017-06-08 DIAGNOSIS — N183 Chronic kidney disease, stage 3 (moderate): Secondary | ICD-10-CM | POA: Diagnosis not present

## 2017-06-08 DIAGNOSIS — Z4659 Encounter for fitting and adjustment of other gastrointestinal appliance and device: Secondary | ICD-10-CM

## 2017-06-08 DIAGNOSIS — Z888 Allergy status to other drugs, medicaments and biological substances status: Secondary | ICD-10-CM | POA: Diagnosis not present

## 2017-06-08 DIAGNOSIS — E876 Hypokalemia: Secondary | ICD-10-CM | POA: Diagnosis not present

## 2017-06-08 DIAGNOSIS — D72829 Elevated white blood cell count, unspecified: Secondary | ICD-10-CM | POA: Diagnosis not present

## 2017-06-08 DIAGNOSIS — I129 Hypertensive chronic kidney disease with stage 1 through stage 4 chronic kidney disease, or unspecified chronic kidney disease: Secondary | ICD-10-CM | POA: Diagnosis not present

## 2017-06-08 DIAGNOSIS — Z4682 Encounter for fitting and adjustment of non-vascular catheter: Secondary | ICD-10-CM | POA: Diagnosis not present

## 2017-06-08 DIAGNOSIS — K219 Gastro-esophageal reflux disease without esophagitis: Secondary | ICD-10-CM | POA: Diagnosis not present

## 2017-06-08 DIAGNOSIS — J309 Allergic rhinitis, unspecified: Secondary | ICD-10-CM | POA: Diagnosis not present

## 2017-06-08 DIAGNOSIS — M797 Fibromyalgia: Secondary | ICD-10-CM | POA: Diagnosis not present

## 2017-06-08 DIAGNOSIS — Z818 Family history of other mental and behavioral disorders: Secondary | ICD-10-CM | POA: Diagnosis not present

## 2017-06-08 DIAGNOSIS — Z9884 Bariatric surgery status: Secondary | ICD-10-CM | POA: Diagnosis not present

## 2017-06-08 DIAGNOSIS — K5090 Crohn's disease, unspecified, without complications: Secondary | ICD-10-CM | POA: Diagnosis present

## 2017-06-08 DIAGNOSIS — Z885 Allergy status to narcotic agent status: Secondary | ICD-10-CM | POA: Diagnosis not present

## 2017-06-08 DIAGNOSIS — K436 Other and unspecified ventral hernia with obstruction, without gangrene: Secondary | ICD-10-CM | POA: Diagnosis not present

## 2017-06-08 DIAGNOSIS — K660 Peritoneal adhesions (postprocedural) (postinfection): Secondary | ICD-10-CM | POA: Diagnosis present

## 2017-06-08 DIAGNOSIS — N179 Acute kidney failure, unspecified: Secondary | ICD-10-CM

## 2017-06-08 DIAGNOSIS — F329 Major depressive disorder, single episode, unspecified: Secondary | ICD-10-CM | POA: Diagnosis not present

## 2017-06-08 DIAGNOSIS — F419 Anxiety disorder, unspecified: Secondary | ICD-10-CM | POA: Diagnosis not present

## 2017-06-08 DIAGNOSIS — I10 Essential (primary) hypertension: Secondary | ICD-10-CM | POA: Diagnosis not present

## 2017-06-08 DIAGNOSIS — Z932 Ileostomy status: Secondary | ICD-10-CM | POA: Diagnosis not present

## 2017-06-08 DIAGNOSIS — Z8249 Family history of ischemic heart disease and other diseases of the circulatory system: Secondary | ICD-10-CM | POA: Diagnosis not present

## 2017-06-08 DIAGNOSIS — K433 Parastomal hernia with obstruction, without gangrene: Secondary | ICD-10-CM | POA: Diagnosis not present

## 2017-06-08 LAB — CBG MONITORING, ED
GLUCOSE-CAPILLARY: 111 mg/dL — AB (ref 65–99)
GLUCOSE-CAPILLARY: 131 mg/dL — AB (ref 65–99)

## 2017-06-08 LAB — GLUCOSE, CAPILLARY
GLUCOSE-CAPILLARY: 52 mg/dL — AB (ref 65–99)
GLUCOSE-CAPILLARY: 118 mg/dL — AB (ref 65–99)

## 2017-06-08 MED ORDER — DEXTROSE-NACL 5-0.45 % IV SOLN
INTRAVENOUS | Status: DC
  Administered 2017-06-08 – 2017-06-09 (×2): via INTRAVENOUS

## 2017-06-08 MED ORDER — VITAMIN D3 25 MCG (1000 UNIT) PO TABS: 1000.0000 [IU] | ORAL_TABLET | Freq: Every day | ORAL | Status: DC

## 2017-06-08 MED ORDER — INSULIN ASPART 100 UNIT/ML ~~LOC~~ SOLN
0.0000 [IU] | Freq: Three times a day (TID) | SUBCUTANEOUS | Status: DC
  Administered 2017-06-08 – 2017-06-09 (×2): 1 [IU] via SUBCUTANEOUS
  Filled 2017-06-08: qty 1

## 2017-06-08 MED ORDER — LIDOCAINE HCL 2 % EX GEL
1.0000 "application " | Freq: Once | CUTANEOUS | Status: AC
  Administered 2017-06-08: 1
  Filled 2017-06-08: qty 5

## 2017-06-08 MED ORDER — PANTOPRAZOLE SODIUM 40 MG PO TBEC: 40.0000 mg | DELAYED_RELEASE_TABLET | Freq: Every day | ORAL | Status: DC

## 2017-06-08 MED ORDER — MUSCLE RUB 10-15 % EX CREA: 1.0000 "application " | TOPICAL_CREAM | CUTANEOUS | Status: DC | PRN

## 2017-06-08 MED ORDER — POLYVINYL ALCOHOL 1.4 % OP SOLN: 1.0000 [drp] | OPHTHALMIC | Status: DC | PRN

## 2017-06-08 MED ORDER — ONDANSETRON HCL 4 MG PO TABS
4.0000 mg | ORAL_TABLET | Freq: Four times a day (QID) | ORAL | Status: DC | PRN
  Filled 2017-06-08: qty 1

## 2017-06-08 MED ORDER — ONDANSETRON HCL 4 MG/2ML IJ SOLN
4.0000 mg | Freq: Four times a day (QID) | INTRAMUSCULAR | Status: DC | PRN
  Administered 2017-06-09 – 2017-06-11 (×4): 4 mg via INTRAVENOUS
  Filled 2017-06-08 (×5): qty 2

## 2017-06-08 MED ORDER — HYDRALAZINE HCL 20 MG/ML IJ SOLN: 10.0000 mg | INTRAMUSCULAR | Status: DC | PRN

## 2017-06-08 MED ORDER — LIP MEDEX EX OINT
1.0000 "application " | TOPICAL_OINTMENT | CUTANEOUS | Status: DC | PRN
  Filled 2017-06-08: qty 7

## 2017-06-08 MED ORDER — DIATRIZOATE MEGLUMINE & SODIUM 66-10 % PO SOLN
90.0000 mL | Freq: Once | ORAL | Status: AC
  Administered 2017-06-08: 90 mL via NASOGASTRIC
  Filled 2017-06-08: qty 90

## 2017-06-08 MED ORDER — POTASSIUM CHLORIDE 10 MEQ/100ML IV SOLN
10.0000 meq | INTRAVENOUS | Status: AC
  Administered 2017-06-08 (×3): 10 meq via INTRAVENOUS
  Filled 2017-06-08 (×3): qty 100

## 2017-06-08 MED ORDER — IOPAMIDOL (ISOVUE-300) INJECTION 61%
INTRAVENOUS | Status: AC
  Administered 2017-06-08: 80 mL via INTRAVENOUS
  Filled 2017-06-08: qty 100

## 2017-06-08 MED ORDER — MAGNESIUM SULFATE IN D5W 1-5 GM/100ML-% IV SOLN
1.0000 g | Freq: Once | INTRAVENOUS | Status: AC
  Administered 2017-06-08: 1 g via INTRAVENOUS
  Filled 2017-06-08: qty 100

## 2017-06-08 MED ORDER — SALINE SPRAY 0.65 % NA SOLN: 1.0000 | NASAL | Status: DC | PRN

## 2017-06-08 MED ORDER — CYANOCOBALAMIN 1000 MCG/ML IJ SOLN
1000.0000 ug | INTRAMUSCULAR | Status: DC
  Administered 2017-06-15: 1000 ug via INTRAMUSCULAR
  Filled 2017-06-08: qty 1

## 2017-06-08 MED ORDER — IOPAMIDOL (ISOVUE-300) INJECTION 61%
100.0000 mL | Freq: Once | INTRAVENOUS | Status: AC | PRN
  Administered 2017-06-08: 80 mL via INTRAVENOUS

## 2017-06-08 MED ORDER — ALUM & MAG HYDROXIDE-SIMETH 200-200-20 MG/5ML PO SUSP: 30.0000 mL | ORAL | Status: DC | PRN

## 2017-06-08 MED ORDER — HYDROCHLOROTHIAZIDE 12.5 MG PO CAPS: 12.5000 mg | ORAL_CAPSULE | Freq: Every day | ORAL | Status: DC

## 2017-06-08 MED ORDER — PANTOPRAZOLE SODIUM 40 MG IV SOLR
40.0000 mg | INTRAVENOUS | Status: DC
  Administered 2017-06-08 – 2017-06-14 (×7): 40 mg via INTRAVENOUS
  Filled 2017-06-08 (×7): qty 40

## 2017-06-08 MED ORDER — MORPHINE SULFATE (PF) 2 MG/ML IV SOLN
1.0000 mg | INTRAVENOUS | Status: DC | PRN
Start: 2017-06-08 — End: 2017-06-09
  Administered 2017-06-08 – 2017-06-09 (×7): 1 mg via INTRAVENOUS
  Filled 2017-06-08 (×6): qty 1

## 2017-06-08 MED ORDER — HYDROCORTISONE 1 % EX CREA: 1.0000 "application " | TOPICAL_CREAM | Freq: Three times a day (TID) | CUTANEOUS | Status: DC | PRN

## 2017-06-08 MED ORDER — ADULT MULTIVITAMIN W/MINERALS CH
1.0000 | ORAL_TABLET | Freq: Every day | ORAL | Status: DC
  Administered 2017-06-13 – 2017-06-16 (×4): 1 via ORAL
  Filled 2017-06-08 (×5): qty 1

## 2017-06-08 MED ORDER — CYANOCOBALAMIN 1000 MCG/ML IJ SOLN: 1000.0000 ug | INTRAMUSCULAR | Status: DC

## 2017-06-08 MED ORDER — SODIUM CHLORIDE 0.9 % IV BOLUS (SEPSIS)
500.0000 mL | Freq: Once | INTRAVENOUS | Status: AC
Start: 2017-06-08 — End: 2017-06-08
  Administered 2017-06-08: 500 mL via INTRAVENOUS

## 2017-06-08 MED ORDER — HYDROCORTISONE 2.5 % RE CREA: 1.0000 "application " | TOPICAL_CREAM | Freq: Four times a day (QID) | RECTAL | Status: DC | PRN

## 2017-06-08 MED ORDER — ACETAMINOPHEN 325 MG PO TABS: 650.0000 mg | ORAL_TABLET | Freq: Four times a day (QID) | ORAL | Status: DC | PRN

## 2017-06-08 MED ORDER — HEPARIN SODIUM (PORCINE) 5000 UNIT/ML IJ SOLN
5000.0000 [IU] | Freq: Three times a day (TID) | INTRAMUSCULAR | Status: DC
  Administered 2017-06-09 – 2017-06-10 (×5): 5000 [IU] via SUBCUTANEOUS
  Filled 2017-06-08 (×5): qty 1

## 2017-06-08 MED ORDER — INSULIN ASPART 100 UNIT/ML ~~LOC~~ SOLN
0.0000 [IU] | Freq: Every day | SUBCUTANEOUS | Status: DC
  Administered 2017-06-10: 0 [IU] via SUBCUTANEOUS
  Administered 2017-06-11: 1 [IU] via SUBCUTANEOUS

## 2017-06-08 MED ORDER — ADULT MULTIVITAMIN W/MINERALS CH: 1.0000 | ORAL_TABLET | Freq: Every day | ORAL | Status: DC

## 2017-06-08 MED ORDER — HYDROMORPHONE HCL 1 MG/ML IJ SOLN
0.5000 mg | Freq: Once | INTRAMUSCULAR | Status: AC
  Administered 2017-06-08: 0.5 mg via INTRAVENOUS
  Filled 2017-06-08: qty 1

## 2017-06-08 MED ORDER — PHENOL 1.4 % MT LIQD: 1.0000 | OROMUCOSAL | Status: DC | PRN

## 2017-06-08 MED ORDER — ACETAMINOPHEN 650 MG RE SUPP: 650.0000 mg | Freq: Four times a day (QID) | RECTAL | Status: DC | PRN

## 2017-06-08 MED ORDER — SODIUM CHLORIDE 0.9 % IV SOLN
INTRAVENOUS | Status: AC
  Administered 2017-06-08: 07:00:00 via INTRAVENOUS

## 2017-06-08 MED ORDER — VITAMIN D3 25 MCG (1000 UNIT) PO TABS
1000.0000 [IU] | ORAL_TABLET | Freq: Every day | ORAL | Status: DC
  Administered 2017-06-13 – 2017-06-16 (×4): 1000 [IU] via ORAL
  Filled 2017-06-08 (×5): qty 1

## 2017-06-08 MED ORDER — GUAIFENESIN-DM 100-10 MG/5ML PO SYRP
5.0000 mL | ORAL_SOLUTION | ORAL | Status: DC | PRN
  Filled 2017-06-08: qty 10

## 2017-06-08 MED ORDER — TRAMADOL-ACETAMINOPHEN 37.5-325 MG PO TABS: 1.0000 | ORAL_TABLET | Freq: Four times a day (QID) | ORAL | Status: DC | PRN

## 2017-06-08 MED ORDER — DEXTROSE 50 % IV SOLN
1.0000 | Freq: Once | INTRAVENOUS | Status: AC
  Administered 2017-06-08: 50 mL via INTRAVENOUS
  Filled 2017-06-08: qty 50

## 2017-06-08 MED ORDER — HEPARIN SODIUM (PORCINE) 5000 UNIT/ML IJ SOLN: 5000.0000 [IU] | Freq: Three times a day (TID) | INTRAMUSCULAR | Status: DC

## 2017-06-08 MED ORDER — ONDANSETRON HCL 4 MG/2ML IJ SOLN
4.0000 mg | Freq: Once | INTRAMUSCULAR | Status: AC
  Administered 2017-06-08: 4 mg via INTRAVENOUS
  Filled 2017-06-08: qty 2

## 2017-06-08 MED ORDER — LORATADINE 10 MG PO TABS: 10.0000 mg | ORAL_TABLET | Freq: Every day | ORAL | Status: DC | PRN

## 2017-06-08 MED ORDER — SENNOSIDES-DOCUSATE SODIUM 8.6-50 MG PO TABS: 1.0000 | ORAL_TABLET | Freq: Every evening | ORAL | Status: DC | PRN

## 2017-06-08 NOTE — ED Notes (Signed)
Assisted to bathroom to void prior to NGT insertion

## 2017-06-08 NOTE — Consult Note (Signed)
Reason for Consult:  SBO Referring Physician: Junius Finner Chief complaint: Abdominal pain and emesis. Emily Sanchez is an 62 y.o. female.   HPI: Patient is a 62 year old female, medical history.  She presents with abdominal pain and emesis was seen by her primary care physician yesterday a.m. and sent to the ED for evaluation for possible small bowel obstruction.  Patient reports she started having symptoms on Sunday 06/06/17, but included nausea and vomiting.  Symptoms become progressively worse and she cannot keep down water.  He was reported in the right lower quadrant and left lower quadrant.  With minimal output from ostomy since yesterday a.m.  Prior abdominal surgeries include cholecystectomy,gastric sleeve with hernia repair 2016, Dr. Hassell Done, partial colectomy Dr Lennie Hummer around 2000, total colectomy/ileostomy 2001_0 , lysis of adhesions, and tubal ligation.  Workup in the ED shows she is afebrile but somewhat hypertensive.  Initial labs show sinus potassium of 3.2, glucose of 101, BUN 34 creatinine 1.31.  Lipase 52.  LFTs were normal.  WBC 8.1, hemoglobin 14.6, hematocrit 44, platelets 264,000.  Urinalysis is negative.  CT scan obtained earlier this a.m. shows pancreatic ductal prominence measuring 6 mm, she has a prior history of pancreatic trauma.  She has had a prior gastric sleeve resection stomach is nondistended, right lower quadrant ileostomy, with increased sized parastomal hernia.  Small bowel obstruction within the parastomal hernia with dilated equalized small bowel contents in the hernia sac of small bowel in the central abdomen and lower pelvis are also dilated and fluid-filled.  There is associated mesenteric edema in the pelvis no pneumatosis or perforation.  Patient has had a prior colectomy.  It was their opinion the patient's right lower quadrant ileostomy was resolved small bowel obstruction with a transition point at the parastomal hernia.  We are asked to see.  An NG tube was  placed and a film this a.m. shows the tip and side-port of the NG tube in the stomach.  Past Medical History:  Diagnosis Date  . Anxiety   . Chronic kidney disease    stage 3   . Crohn's disease ([place])   . Depression   . Enteritis due to Clostridium difficile 08/16/2012  . 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 GERD (gastroesophageal reflux disease)   . HTN (hypertension)   . Ileostomy in place [facility])   . Low back pain    FMS  . Osteoarthritis    Dr Alvan Dame; both knees  . Perianal pain    Chronic, Post-Op  . Transfusion history    none recent  . Ulcerative colitis ([place])    Dr Sharlett Iles    Past Surgical History:  Procedure Laterality Date  . BREATH TEK H PYLORI N/A 12/05/2013   Procedure: BREATH TEK H PYLORI;  Surgeon: Pedro Earls, MD;  Location: [facility];  Service: General;  Laterality: N/A;  . COLECTOMY  2001   ulcerative colitis  . ILEOSTOMY    . LAPAROSCOPIC GASTRIC SLEEVE RESECTION WITH HIATAL HERNIA REPAIR N/A 04/02/2015   Procedure: LAPAROSCOPIC LYSIS OF ADHESIONS GASTRIC SLEEVE RESECTION WITH OPEN VENTRAL HERNIA REPAIR;  Surgeon: Johnathan Hausen, MD;  Location: [facility];  Service: General;  Laterality: N/A;  . LAPAROSCOPIC LYSIS OF ADHESIONS  04/02/2015   Procedure: LAPAROSCOPIC LYSIS OF ADHESIONS;  Surgeon: Johnathan Hausen, MD;  Location: [facility];  Service: General;;  . TONSILLECTOMY    . TUBAL LIGATION    . UPPER GI ENDOSCOPY  04/02/2015   Procedure: UPPER GI ENDOSCOPY;  Surgeon: Johnathan Hausen,  MD;  Location: [facility];  Service: General;;  . VENTRAL HERNIA REPAIR  04/02/2015   Procedure: HERNIA REPAIR VENTRAL ADULT;  Surgeon: Johnathan Hausen, MD;  Location: [facility];  Service: General;;    Family History  Problem Relation Age of Onset  . Heart disease Father 38       CHF  . Depression Sister   . Hypertension Unknown     Social History:  reports that she has quit smoking. she has never used smokeless tobacco. She reports that she does not drink alcohol or use  drugs.  Allergies:  Allergies  Allergen Reactions  . Amphetamine-Dextroamphet Er Nausea And Vomiting  . Cymbalta [Duloxetine Hcl] Other (See Comments)    "Made me sleepy."  . Erythromycin Ethylsuccinate Nausea And Vomiting  . Morphine Nausea And Vomiting    Prior to Admission medications   Medication Sig Start Date End Date Taking? Authorizing Provider  acetaminophen (TYLENOL) 500 MG tablet Take 1,000 mg by mouth every 6 (six) hours as needed for mild pain.   Yes [provider]  cholecalciferol (VITAMIN D) 1000 units tablet Take 1,000 Units by mouth daily.   Yes [provider]  cyanocobalamin (,VITAMIN B-12,) 1000 MCG/ML injection INJECT 1 ML (1,000 MCG TOTAL) INTO THE MUSCLE EVERY 14 (FOURTEEN) DAYS. 01/04/17  Yes Plotnikov, Evie Lacks, MD  hydrochlorothiazide (MICROZIDE) 12.5 MG capsule TAKE 1 CAPSULE (12.5 MG TOTAL) BY MOUTH DAILY. 04/23/17  Yes Plotnikov, Evie Lacks, MD  Multiple Vitamin (MULTIVITAMIN WITH MINERALS) TABS tablet Take 1 tablet by mouth daily.   Yes [provider]  pantoprazole (PROTONIX) 40 MG tablet Take 1 tablet (40 mg total) by mouth daily. 10/13/16  Yes Plotnikov, Evie Lacks, MD  traMADol-acetaminophen (ULTRACET) 37.5-325 MG tablet Take 1-2 tablets by mouth every 6 (six) hours as needed for severe pain. 02/16/17  Yes Plotnikov, Evie Lacks, MD  BD INTEGRA SYRINGE 25G X 1" 3 ML MISC TO BE USED FOR B12 INJECTIONS 02/17/16   Plotnikov, Evie Lacks, MD  cyclobenzaprine (FLEXERIL) 5 MG tablet Take 1 tablet (5 mg total) by mouth at bedtime. Patient not taking: Reported on 06/08/2017 12/25/16   Plotnikov, Evie Lacks, MD  ferrous sulfate 325 (65 FE) MG tablet Take 1 tablet (325 mg total) by mouth 2 (two) times daily with a meal. Patient not taking: Reported on 06/08/2017 02/24/16 02/23/17  Plotnikov, Evie Lacks, MD  Milnacipran (SAVELLA) 50 MG TABS tablet Take 1 tablet (50 mg total) by mouth 2 (two) times daily. Patient not taking: Reported on 06/08/2017 10/13/16    Plotnikov, Evie Lacks, MD  SYRINGE-NEEDLE, DISP, 3 ML (B-D INTEGRA SYRINGE) 23G X 1" 3 ML MISC 1 Device by Does not apply route every 30 (thirty) days. For B12 shots 12/10/15   Plotnikov, Evie Lacks, MD     Results for orders placed or performed during the hospital encounter of 06/08/17 (from the past 48 hour(s))  Lipase, blood     Status: Abnormal   Collection Time: 06/07/17  4:50 PM  Result Value Ref Range   Lipase 52 (H) 11 - 51 U/L    Comment: Performed at [facility], [place] [address], [place], [place] [address]  Comprehensive metabolic panel     Status: Abnormal   Collection Time: 06/07/17  4:50 PM  Result Value Ref Range   Sodium 141 135 - 145 mmol/L   Potassium 3.2 (L) 3.5 - 5.1 mmol/L   Chloride 102 101 - 111 mmol/L   CO2 25 22 - 32  mmol/L   Glucose, Bld 101 (H) 65 - 99 mg/dL   BUN 34 (H) 6 - 20 mg/dL   Creatinine, Ser 1.31 (H) 0.44 - 1.00 mg/dL   Calcium 10.2 8.9 - 10.3 mg/dL   Total Protein 8.7 (H) 6.5 - 8.1 g/dL   Albumin 4.3 3.5 - 5.0 g/dL   AST 33 15 - 41 U/L   ALT 24 14 - 54 U/L   Alkaline Phosphatase 79 38 - 126 U/L   Total Bilirubin 0.7 0.3 - 1.2 mg/dL   GFR calc non Af Amer 43 (L) >60 mL/min   GFR calc Af Amer 50 (L) >60 mL/min    Comment: (NOTE) The eGFR has been calculated using the CKD EPI equation. This calculation has not been validated in all clinical situations. eGFR's persistently <60 mL/min signify possible Chronic Kidney Disease.    Anion gap 14 5 - 15    Comment: Performed at [facility], [place] [address], [place], [place] [address]  CBC     Status: None   Collection Time: 06/07/17  4:50 PM  Result Value Ref Range   WBC 8.1 4.0 - 10.5 K/uL   RBC 4.72 3.87 - 5.11 MIL/uL   Hemoglobin 14.6 12.0 - 15.0 g/dL   HCT 44.1 36.0 - 46.0 %   MCV 93.4 78.0 - 100.0 fL   MCH 30.9 26.0 - 34.0 pg   MCHC 33.1 30.0 - 36.0 g/dL   RDW 14.4 11.5 - 15.5 %   Platelets 264 150 - 400 K/uL    Comment: Performed at [facility], [place] [address] [place], [place] [address]  Urinalysis, Routine w reflex microscopic     Status: Abnormal   Collection Time: 06/07/17 10:01 PM  Result Value Ref Range   Color, Urine YELLOW YELLOW   APPearance CLEAR CLEAR   Specific Gravity, Urine 1.026 1.005 - 1.030   pH 5.0 5.0 - 8.0   Glucose, UA NEGATIVE NEGATIVE mg/dL   Hgb urine dipstick NEGATIVE NEGATIVE   Bilirubin Urine NEGATIVE NEGATIVE   Ketones, ur NEGATIVE NEGATIVE mg/dL   Protein, ur 100 (A) NEGATIVE mg/dL   Nitrite NEGATIVE NEGATIVE   Leukocytes, UA TRACE (A) NEGATIVE   RBC / HPF 0-5 0 - 5 RBC/hpf   WBC, UA 0-5 0 - 5 WBC/hpf   Bacteria, UA NONE SEEN NONE SEEN   Squamous Epithelial / LPF 6-30 (A) NONE SEEN   Mucus PRESENT    Hyaline Casts, UA PRESENT     Comment: Performed at [facility], [place] [address], [place], [place] [address]    Dg Abdomen 1 View  Result Date: 06/08/2017 CLINICAL DATA:  NG tube placement EXAM: ABDOMEN - 1 VIEW COMPARISON:  CT abdomen pelvis 06/08/2017 FINDINGS: Tip and side port of the nasogastric tube overlie the stomach. No dilated bowel is visible. IMPRESSION: Tip and side port of the nasogastric tube in the stomach. Electronically Signed   By: Ulyses Jarred M.D.   On: 06/08/2017 05:58   Ct Abdomen Pelvis W Contrast  Result Date: 06/08/2017 CLINICAL DATA:  Abdominal pain and distension. Nausea and vomiting. No output from ostomy. EXAM: CT ABDOMEN AND PELVIS WITH CONTRAST TECHNIQUE: Multidetector CT imaging of the abdomen and pelvis was performed using the standard protocol following bolus administration of intravenous contrast. CONTRAST:  80 cc Isovue-300 IV COMPARISON:  CT 10/29/2015 FINDINGS: Lower chest: Tiny subpleural nodule in the left lower lobe is unchanged from prior exam and considered benign. No  consolidation. No pleural fluid. Hepatobiliary: Scattered subcentimeter hepatic hypodensities are too small to be accurately characterize. Unchanged  appearance of gallbladder without calcified gallstone or biliary dilatation. Pancreas: Pancreatic ductal prominence measuring 6 mm in the body. No peripancreatic inflammation. No evidence of focal pancreatic mass. Spleen: Normal in size without focal abnormality. Adrenals/Urinary Tract: No adrenal nodule. Right renal scarring related to prior trauma. Prior subcapsular hematoma has resolved. Mild left renal cortical scarring that is chronic. No hydronephrosis. No perinephric edema. Absent delayed excretion on delayed imaging. Urinary bladder is near completely decompressed. Stomach/Bowel: Post gastric sleeve resection. Stomach is nondistended. Right lower quadrant ileostomy. Increased size of parastomal hernia. Small bowel obstruction within the parastomal hernia with dilated fecalized small bowel contents in the hernia sac. The small bowel in the central abdomen and lower pelvis are also dilated and fluid-filled. There is associated mesenteric edema in the pelvis. No pneumatosis or perforation. Patient is post total colectomy. Vascular/Lymphatic: Mild aortic atherosclerosis. Small retroperitoneal nodes, not enlarged by size criteria. Reproductive: Probable uterine fibroid is unchanged. No adnexal mass. Other: Small amount of free fluid. Mesenteric edema related to of small bowel dilatation. No free air. Small fat containing midline ventral abdominal wall hernia. Musculoskeletal: There are no acute or suspicious osseous abnormalities. IMPRESSION: 1. Right lower quadrant ileostomy with small bowel obstruction, transition point at the parastomal hernia. 2. Pancreatic ductal prominence of 6 mm, likely sequela of prior pancreatic trauma. 3. Bilateral renal cortical scarring. Absent renal excretion on delayed phase imaging suggest underlying renal dysfunction. Electronically Signed   By: Jeb Levering M.D.   On: 06/08/2017 04:14    Review of Systems  Constitutional: Negative.        Gastric bypass 03/2015 with  80-90 lb weight loss.stabel since that time.   HENT: Negative.   Eyes: Negative.   Respiratory: Negative.   Cardiovascular: Negative.   Gastrointestinal: Positive for abdominal pain, heartburn (occasional), nausea and vomiting.       Pt was in high output state 2/14, 2/15, 2/16, and early 2/17, then output dropped off PM 2/17, no output since late 2/18.  Genitourinary: Negative.   Musculoskeletal: Positive for back pain, joint pain and neck pain.       Fibromyalgia from neck down, chronic discomfort, needs right knee replacement also - Dr. Alvan Dame following  Skin: Negative.   Neurological: Negative.   Endo/Heme/Allergies: Negative.   Psychiatric/Behavioral: Positive for depression. The patient is nervous/anxious.    Blood pressure (!) 134/91, pulse (!) 112, temperature 98.2 F (36.8 C), temperature source Oral, resp. rate 14, weight 99.5 kg (219 lb 6.4 oz), SpO2 95 %. Physical Exam  Constitutional: She is oriented to person, place, and time. She appears well-developed and well-nourished. No distress.  NG in place with some drainage, pain right abdomen with distension at the site of her ileostomy.  She says this is new.    HENT:  Head: Normocephalic and atraumatic.  Mouth/Throat: Oropharynx is clear and moist. No oropharyngeal exudate.  NG in place  Eyes: Right eye exhibits no discharge. Left eye exhibits no discharge. No scleral icterus.  Pupils are equal  Neck: Normal range of motion. Neck supple. No JVD present. No tracheal deviation present. No thyromegaly present.  Cardiovascular: Normal rate, regular rhythm, normal heart sounds and intact distal pulses.  No murmur heard. Respiratory: Effort normal and breath sounds normal. No respiratory distress. She has no wheezes. She has no rales. She exhibits no tenderness.  GI: She exhibits distension (she has a right parastomal  hernia with distension and tenderness, that is new.). She exhibits no mass. There is tenderness. There is no rebound  and no guarding.  She is soft except over the parastomal hernia.  There is distension that is new, and it is tender.  She has not had any out put since yesterday AM from her ileostomy.  Musculoskeletal: She exhibits no edema or tenderness.  Lymphadenopathy:    She has no cervical adenopathy.  Neurological: She is alert and oriented to person, place, and time. No cranial nerve deficit.  Skin: Skin is warm and dry. No rash noted. She is not diaphoretic. No erythema. No pallor.  Psychiatric: She has a normal mood and affect. Her behavior is normal. Judgment and thought content normal.    Assessment/Plan: SBO with small bowel in the parastomal hernia sac Crohn's disease - currently not treated  Partial colectomy,around 2000, total colectomy/ileostomy ~2001 at [facility], Gastric sleeve, ventral hernia, hiatal hernia repair 2016, Dr. Hassell Done Hypertension Fibromyalgia Anemia GERD   Plan:  NG decompression, we will start the SB protocol later today, and follow with you.  Agree with NPO, fluid hydration, ongoing bowel rest and NG decompression.     Emily Sanchez 06/08/2017, 7:22 AM

## 2017-06-08 NOTE — ED Notes (Signed)
Patient complaining 8-9/10 pain to right lower abdominal area-medicated with Dilaudid as ordered

## 2017-06-08 NOTE — ED Notes (Signed)
Bed: WA21 Expected date:  Expected time:  Means of arrival:  Comments: 

## 2017-06-08 NOTE — ED Notes (Signed)
Attempted an IV x 2 with no success.

## 2017-06-08 NOTE — ED Notes (Signed)
ED TO INPATIENT HANDOFF REPORT  Name/Age/Gender Emily Sanchez 62 y.o. female  Code Status    Code Status Orders  (From admission, onward)        Start     Ordered   06/08/17 0744  Full code  Continuous     06/08/17 0745    Code Status History    Date Active Date Inactive Code Status Order ID Comments User Context   08/31/2015 04:05 09/04/2015 19:07 Full Code [id number]  Judeth Horn, MD ED   04/02/2015 20:30 04/08/2015 15:12 Full Code [id number]  Johnathan Hausen, MD Inpatient   08/16/2012 04:54 08/18/2012 16:22 Full Code [id number]  Orvan Falconer, MD Inpatient      Home/SNF/Other Home  Chief Complaint N/V/D   Level of Care/Admitting Diagnosis ED Disposition    ED Disposition Condition [facility] Area: [facility] [[id number]]  Level of Care: Med-Surg [16]  Diagnosis: SBO (small bowel obstruction) [facility]) [025852]  Admitting Physician: Gerlean Ren [facility] [[id number]]  Attending Physician: Gerlean Ren [facility] [[id number]]  Estimated length of stay: past midnight tomorrow  Certification:: I certify this patient will need inpatient services for at least 2 midnights  PT Class (Do Not Modify): Inpatient [101]  PT Acc Code (Do Not Modify): Private [1]       Medical History Past Medical History:  Diagnosis Date  . Anxiety   . Chronic kidney disease    stage 3   . Crohn's disease ([place])   . Depression   . Enteritis due to Clostridium difficile 08/16/2012  . 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 GERD (gastroesophageal reflux disease)   . HTN (hypertension)   . Ileostomy in place [facility])   . Low back pain    FMS  . Osteoarthritis    Dr Alvan Dame; both knees  . Perianal pain    Chronic, Post-Op  . Transfusion history    none recent  . Ulcerative colitis ([place])    Dr Sharlett Iles    Allergies Allergies  Allergen Reactions  . Amphetamine-Dextroamphet Er Nausea And Vomiting  . Cymbalta [Duloxetine Hcl] Other (See Comments)    "Made me sleepy."  .  Erythromycin Ethylsuccinate Nausea And Vomiting  . Morphine Nausea And Vomiting    IV Location/Drains/Wounds Patient Lines/Drains/Airways Status   Active Line/Drains/Airways    Name:   Placement date:   Placement time:   Site:   Days:   Peripheral IV 06/08/17 Right Hand   06/08/17    0305    Hand   less than 1   Negative Pressure Wound Therapy Abdomen Mid   04/02/15    1808    -   798   NG/OG Tube Nasogastric 16 Fr. Right nare Xray   06/08/17    0525    Right nare   less than 1   Colostomy RLQ   04/02/15    -    RLQ   798   Ileostomy RLQ   -    -    RLQ      Incision (Closed) 04/02/15 Abdomen   04/02/15    1807     798   Incision - 1 Port Abdomen Left;Lower   04/02/15    1540     798   Incision - 5 Ports Abdomen Right;Lateral Right;Medial Mid;Upper Left;Medial Left;Lateral   04/02/15    1540     798          Labs/Imaging Results for orders placed or performed during  the hospital encounter of 06/08/17 (from the past 48 hour(s))  Lipase, blood     Status: Abnormal   Collection Time: 06/07/17  4:50 PM  Result Value Ref Range   Lipase 52 (H) 11 - 51 U/L    Comment: Performed at [facility], [place] [address] [place], [place] [address]  Comprehensive metabolic panel     Status: Abnormal   Collection Time: 06/07/17  4:50 PM  Result Value Ref Range   Sodium 141 135 - 145 mmol/L   Potassium 3.2 (L) 3.5 - 5.1 mmol/L   Chloride 102 101 - 111 mmol/L   CO2 25 22 - 32 mmol/L   Glucose, Bld 101 (H) 65 - 99 mg/dL   BUN 34 (H) 6 - 20 mg/dL   Creatinine, Ser 1.31 (H) 0.44 - 1.00 mg/dL   Calcium 10.2 8.9 - 10.3 mg/dL   Total Protein 8.7 (H) 6.5 - 8.1 g/dL   Albumin 4.3 3.5 - 5.0 g/dL   AST 33 15 - 41 U/L   ALT 24 14 - 54 U/L   Alkaline Phosphatase 79 38 - 126 U/L   Total Bilirubin 0.7 0.3 - 1.2 mg/dL   GFR calc non Af Amer 43 (L) >60 mL/min   GFR calc Af Amer 50 (L) >60 mL/min    Comment: (NOTE) The eGFR has been calculated using the CKD EPI equation. This calculation  has not been validated in all clinical situations. eGFR's persistently <60 mL/min signify possible Chronic Kidney Disease.    Anion gap 14 5 - 15    Comment: Performed at [facility] [place] [address], [place], [place] [address]  CBC     Status: None   Collection Time: 06/07/17  4:50 PM  Result Value Ref Range   WBC 8.1 4.0 - 10.5 K/uL   RBC 4.72 3.87 - 5.11 MIL/uL   Hemoglobin 14.6 12.0 - 15.0 g/dL   HCT 44.1 36.0 - 46.0 %   MCV 93.4 78.0 - 100.0 fL   MCH 30.9 26.0 - 34.0 pg   MCHC 33.1 30.0 - 36.0 g/dL   RDW 14.4 11.5 - 15.5 %   Platelets 264 150 - 400 K/uL    Comment: Performed at [facility], [place] [address] [place], [place] [address]  Urinalysis, Routine w reflex microscopic     Status: Abnormal   Collection Time: 06/07/17 10:01 PM  Result Value Ref Range   Color, Urine YELLOW YELLOW   APPearance CLEAR CLEAR   Specific Gravity, Urine 1.026 1.005 - 1.030   pH 5.0 5.0 - 8.0   Glucose, UA NEGATIVE NEGATIVE mg/dL   Hgb urine dipstick NEGATIVE NEGATIVE   Bilirubin Urine NEGATIVE NEGATIVE   Ketones, ur NEGATIVE NEGATIVE mg/dL   Protein, ur 100 (A) NEGATIVE mg/dL   Nitrite NEGATIVE NEGATIVE   Leukocytes, UA TRACE (A) NEGATIVE   RBC / HPF 0-5 0 - 5 RBC/hpf   WBC, UA 0-5 0 - 5 WBC/hpf   Bacteria, UA NONE SEEN NONE SEEN   Squamous Epithelial / LPF 6-30 (A) NONE SEEN   Mucus PRESENT    Hyaline Casts, UA PRESENT     Comment: Performed at [facility], [place] [address] [place], [place] [address]  CBG monitoring, ED     Status: Abnormal   Collection Time: 06/08/17  9:50 AM  Result Value Ref Range   Glucose-Capillary 111 (H) 65 - 99 mg/dL  CBG monitoring, ED     Status:  Abnormal   Collection Time: 06/08/17 11:59 AM  Result Value Ref Range   Glucose-Capillary 131 (H) 65 - 99 mg/dL   Dg Abdomen 1 View  Result Date: 06/08/2017 CLINICAL DATA:  NG tube placement EXAM: ABDOMEN - 1 VIEW COMPARISON:  CT abdomen pelvis 06/08/2017  FINDINGS: Tip and side port of the nasogastric tube overlie the stomach. No dilated bowel is visible. IMPRESSION: Tip and side port of the nasogastric tube in the stomach. Electronically Signed   By: Ulyses Jarred M.D.   On: 06/08/2017 05:58   Ct Abdomen Pelvis W Contrast  Result Date: 06/08/2017 CLINICAL DATA:  Abdominal pain and distension. Nausea and vomiting. No output from ostomy. EXAM: CT ABDOMEN AND PELVIS WITH CONTRAST TECHNIQUE: Multidetector CT imaging of the abdomen and pelvis was performed using the standard protocol following bolus administration of intravenous contrast. CONTRAST:  80 cc Isovue-300 IV COMPARISON:  CT 10/29/2015 FINDINGS: Lower chest: Tiny subpleural nodule in the left lower lobe is unchanged from prior exam and considered benign. No consolidation. No pleural fluid. Hepatobiliary: Scattered subcentimeter hepatic hypodensities are too small to be accurately characterize. Unchanged appearance of gallbladder without calcified gallstone or biliary dilatation. Pancreas: Pancreatic ductal prominence measuring 6 mm in the body. No peripancreatic inflammation. No evidence of focal pancreatic mass. Spleen: Normal in size without focal abnormality. Adrenals/Urinary Tract: No adrenal nodule. Right renal scarring related to prior trauma. Prior subcapsular hematoma has resolved. Mild left renal cortical scarring that is chronic. No hydronephrosis. No perinephric edema. Absent delayed excretion on delayed imaging. Urinary bladder is near completely decompressed. Stomach/Bowel: Post gastric sleeve resection. Stomach is nondistended. Right lower quadrant ileostomy. Increased size of parastomal hernia. Small bowel obstruction within the parastomal hernia with dilated fecalized small bowel contents in the hernia sac. The small bowel in the central abdomen and lower pelvis are also dilated and fluid-filled. There is associated mesenteric edema in the pelvis. No pneumatosis or perforation. Patient is  post total colectomy. Vascular/Lymphatic: Mild aortic atherosclerosis. Small retroperitoneal nodes, not enlarged by size criteria. Reproductive: Probable uterine fibroid is unchanged. No adnexal mass. Other: Small amount of free fluid. Mesenteric edema related to of small bowel dilatation. No free air. Small fat containing midline ventral abdominal wall hernia. Musculoskeletal: There are no acute or suspicious osseous abnormalities. IMPRESSION: 1. Right lower quadrant ileostomy with small bowel obstruction, transition point at the parastomal hernia. 2. Pancreatic ductal prominence of 6 mm, likely sequela of prior pancreatic trauma. 3. Bilateral renal cortical scarring. Absent renal excretion on delayed phase imaging suggest underlying renal dysfunction. Electronically Signed   By: Jeb Levering M.D.   On: 06/08/2017 04:14    Pending Labs Unresulted Labs (From admission, onward)   Start     Ordered   06/09/17 0500  Comprehensive metabolic panel  Tomorrow morning,   R     06/08/17 0745   06/09/17 0500  CBC  Tomorrow morning,   R     06/08/17 0745   06/09/17 0500  Protime-INR  Tomorrow morning,   R     06/08/17 0745   06/09/17 0500  APTT  Tomorrow morning,   R     06/08/17 0745   06/09/17 0500  Magnesium  Tomorrow morning,   R     06/08/17 0745   06/08/17 0745  HIV antibody (Routine Testing)  Once,   R     06/08/17 0745   06/08/17 0743  CBC  (heparin)  Once,   R    Comments:  Baseline  for heparin therapy IF NOT ALREADY DRAWN.  Notify MD if PLT < 100 K.    06/08/17 0745   06/08/17 0743  Creatinine, serum  (heparin)  Once,   R    Comments:  Baseline for heparin therapy IF NOT ALREADY DRAWN.    06/08/17 0745      Vitals/Pain Today's Vitals   06/08/17 0735 06/08/17 0921 06/08/17 1051 06/08/17 1253  BP: 120/73 135/80 129/84 130/80  Pulse: (!) 107 99 (!) 102 98  Resp: _0 Temp:      TempSrc:      SpO2: 98% 96% 97% 99%  Weight:      PainSc:        Isolation Precautions No  active isolations  Medications Medications  0.9 %  sodium chloride infusion ( Intravenous New Bag/Given 06/08/17 0632)  heparin injection 5,000 Units (5,000 Units Subcutaneous Not Given 06/08/17 0925)  dextrose 5 %-0.45 % sodium chloride infusion ( Intravenous New Bag/Given 06/08/17 0937)  acetaminophen (TYLENOL) tablet 650 mg (not administered)    Or  acetaminophen (TYLENOL) suppository 650 mg (not administered)  senna-docusate (Senokot-S) tablet 1 tablet (not administered)  ondansetron (ZOFRAN) tablet 4 mg (not administered)    Or  ondansetron (ZOFRAN) injection 4 mg (not administered)  insulin aspart (novoLOG) injection 0-9 Units (1 Units Subcutaneous Given 06/08/17 1212)  insulin aspart (novoLOG) injection 0-5 Units (not administered)  potassium chloride 10 mEq in 100 mL IVPB (10 mEq Intravenous New Bag/Given 06/08/17 1309)  multivitamin with minerals tablet 1 tablet (1 tablet Oral Not Given 06/08/17 1231)  cyanocobalamin ((VITAMIN B-12)) injection 1,000 mcg (1,000 mcg Intramuscular Not Given 06/08/17 0925)  traMADol-acetaminophen (ULTRACET) 37.5-325 MG per tablet 1-2 tablet (not administered)  cholecalciferol (VITAMIN D) tablet 1,000 Units (1,000 Units Oral Not Given 06/08/17 1231)  diatrizoate meglumine-sodium (GASTROGRAFIN) 66-10 % solution 90 mL (not administered)  morphine 2 MG/ML injection 1 mg (not administered)  pantoprazole (PROTONIX) injection 40 mg (not administered)  hydrALAZINE (APRESOLINE) injection 10 mg (not administered)  loratadine (CLARITIN) tablet 10 mg (not administered)  lip balm (CARMEX) ointment 1 application (not administered)  polyvinyl alcohol (LIQUIFILM TEARS) 1.4 % ophthalmic solution 1 drop (not administered)  hydrocortisone (ANUSOL-HC) 2.5 % rectal cream 1 application (not administered)  alum & mag hydroxide-simeth (MAALOX/MYLANTA) 200-200-20 MG/5ML suspension 30 mL (not administered)  hydrocortisone cream 1 % 1 application (not administered)  MUSCLE RUB CREA  1 application (not administered)  sodium chloride (OCEAN) 0.65 % nasal spray 1 spray (not administered)  phenol (CHLORASEPTIC) mouth spray 1 spray (not administered)  guaiFENesin-dextromethorphan (ROBITUSSIN DM) 100-10 MG/5ML syrup 5 mL (not administered)  sodium chloride 0.9 % bolus 500 mL (0 mLs Intravenous Stopped 06/08/17 0442)  HYDROmorphone (DILAUDID) injection 0.5 mg (0.5 mg Intravenous Given 06/08/17 0314)  ondansetron (ZOFRAN) injection 4 mg (4 mg Intravenous Given 06/08/17 0312)  iopamidol (ISOVUE-300) 61 % injection 100 mL (80 mLs Intravenous Contrast Given 06/08/17 0337)  lidocaine (XYLOCAINE) 2 % jelly 1 application (1 application Other Given 06/08/17 0525)  ondansetron (ZOFRAN) injection 4 mg (4 mg Intravenous Given 06/08/17 0531)  HYDROmorphone (DILAUDID) injection 0.5 mg (0.5 mg Intravenous Given 06/08/17 0632)  magnesium sulfate IVPB 1 g 100 mL (0 g Intravenous Stopped 06/08/17 1157)    Mobility walks
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
## 2017-06-08 NOTE — ED Notes (Signed)
To CT and returned-patient states still has pain and swelling to right lower abd area-no output from colostomy bag

## 2017-06-08 NOTE — H&P (Signed)
History and Physical    Emily Sanchez ENI:[id number] DOB: [birth year] DOA: 06/08/2017  PCP: Cassandria Anger, MD Patient coming from: Home  Chief Complaint: Nausea vomiting  HPI: Emily Sanchez is a 62 y.o. female with medical history significant of ulcerative colitis/Crohn's disease status post total colectomy in 2001, gastric sleeve in 2016, essential hypertension, GERD came to the hospital with complains of nausea and vomiting.  Patient states she had been doing well up until middle of the last week when she started having some episodes of diarrhea without any fevers, chills and abdominal pain.  But 2 days ago she started experiencing persistent nausea and nonbloody vomiting.  She has not been able to keep any oral intake.  Due to this her ostomy output has been very minimal.  She decided to come to the ER for further evaluation.  In the ER she was in distress due to nausea and vomiting.  CT of the abdomen pelvis was done which showed right lower quadrant ileostomy with small bowel obstruction, transition point apparently stomal hernia, pancreatic ductal prominence, bilateral renal cortical scarring.  She was given antiemetics, IV fluids and NG tube was placed.  General surgery was consulted.  Medical team was requested to admit the patient.   Review of Systems: As per HPI otherwise 10 point review of systems negative.   Past Medical History:  Diagnosis Date  . Anxiety   . Chronic kidney disease    stage 3   . Crohn's disease ([place])   . Depression   . Enteritis due to Clostridium difficile 08/16/2012  . 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 GERD (gastroesophageal reflux disease)   . HTN (hypertension)   . Ileostomy in place [facility])   . Low back pain    FMS  . Osteoarthritis    Dr Alvan Dame; both knees  . Perianal pain    Chronic, Post-Op  . Transfusion history    none recent  . Ulcerative colitis ([place])    Dr Sharlett Iles    Past Surgical History:  Procedure Laterality Date  .  BREATH TEK H PYLORI N/A 12/05/2013   Procedure: BREATH TEK H PYLORI;  Surgeon: Pedro Earls, MD;  Location: [facility];  Service: General;  Laterality: N/A;  . COLECTOMY  2001   ulcerative colitis  . ILEOSTOMY    . LAPAROSCOPIC GASTRIC SLEEVE RESECTION WITH HIATAL HERNIA REPAIR N/A 04/02/2015   Procedure: LAPAROSCOPIC LYSIS OF ADHESIONS GASTRIC SLEEVE RESECTION WITH OPEN VENTRAL HERNIA REPAIR;  Surgeon: Johnathan Hausen, MD;  Location: [facility];  Service: General;  Laterality: N/A;  . LAPAROSCOPIC LYSIS OF ADHESIONS  04/02/2015   Procedure: LAPAROSCOPIC LYSIS OF ADHESIONS;  Surgeon: Johnathan Hausen, MD;  Location: [facility];  Service: General;;  . TONSILLECTOMY    . TUBAL LIGATION    . UPPER GI ENDOSCOPY  04/02/2015   Procedure: UPPER GI ENDOSCOPY;  Surgeon: Johnathan Hausen, MD;  Location: [facility];  Service: General;;  . VENTRAL HERNIA REPAIR  04/02/2015   Procedure: HERNIA REPAIR VENTRAL ADULT;  Surgeon: Johnathan Hausen, MD;  Location: [facility];  Service: General;;     reports that she has quit smoking. she has never used smokeless tobacco. She reports that she does not drink alcohol or use drugs.  Allergies  Allergen Reactions  . Amphetamine-Dextroamphet Er Nausea And Vomiting  . Cymbalta [Duloxetine Hcl] Other (See Comments)    "Made me sleepy."  . Erythromycin Ethylsuccinate Nausea And Vomiting  . Morphine Nausea And Vomiting    Family History  Problem Relation Age of Onset  . Heart disease Father 63       CHF  . Depression Sister   . Hypertension Unknown      Prior to Admission medications   Medication Sig Start Date End Date Taking? Authorizing Provider  acetaminophen (TYLENOL) 500 MG tablet Take 1,000 mg by mouth every 6 (six) hours as needed for mild pain.   Yes [provider]  cholecalciferol (VITAMIN D) 1000 units tablet Take 1,000 Units by mouth daily.   Yes [provider]  cyanocobalamin (,VITAMIN B-12,) 1000 MCG/ML injection INJECT 1 ML (1,000 MCG  TOTAL) INTO THE MUSCLE EVERY 14 (FOURTEEN) DAYS. 01/04/17  Yes Plotnikov, Evie Lacks, MD  hydrochlorothiazide (MICROZIDE) 12.5 MG capsule TAKE 1 CAPSULE (12.5 MG TOTAL) BY MOUTH DAILY. 04/23/17  Yes Plotnikov, Evie Lacks, MD  Multiple Vitamin (MULTIVITAMIN WITH MINERALS) TABS tablet Take 1 tablet by mouth daily.   Yes [provider]  pantoprazole (PROTONIX) 40 MG tablet Take 1 tablet (40 mg total) by mouth daily. 10/13/16  Yes Plotnikov, Evie Lacks, MD  traMADol-acetaminophen (ULTRACET) 37.5-325 MG tablet Take 1-2 tablets by mouth every 6 (six) hours as needed for severe pain. 02/16/17  Yes Plotnikov, Evie Lacks, MD  BD INTEGRA SYRINGE 25G X 1" 3 ML MISC TO BE USED FOR B12 INJECTIONS 02/17/16   Plotnikov, Evie Lacks, MD  cyclobenzaprine (FLEXERIL) 5 MG tablet Take 1 tablet (5 mg total) by mouth at bedtime. Patient not taking: Reported on 06/08/2017 12/25/16   Plotnikov, Evie Lacks, MD  ferrous sulfate 325 (65 FE) MG tablet Take 1 tablet (325 mg total) by mouth 2 (two) times daily with a meal. Patient not taking: Reported on 06/08/2017 02/24/16 02/23/17  Plotnikov, Evie Lacks, MD  Milnacipran (SAVELLA) 50 MG TABS tablet Take 1 tablet (50 mg total) by mouth 2 (two) times daily. Patient not taking: Reported on 06/08/2017 10/13/16   Plotnikov, Evie Lacks, MD  SYRINGE-NEEDLE, DISP, 3 ML (B-D INTEGRA SYRINGE) 23G X 1" 3 ML MISC 1 Device by Does not apply route every 30 (thirty) days. For B12 shots 12/10/15   Cassandria Anger, MD    Physical Exam: Vitals:   06/08/17 0555 06/08/17 0600 06/08/17 0628 06/08/17 0735  BP: 115/76 (!) 134/91 (!) 134/91 120/73  Pulse:  (!) 107 (!) 112 (!) 107  Resp:   14 16  Temp:      TempSrc:      SpO2:  96% 95% 98%  Weight:          Constitutional: NAD, calm, comfortable, NG tube in place Vitals:   06/08/17 0555 06/08/17 0600 06/08/17 0628 06/08/17 0735  BP: 115/76 (!) 134/91 (!) 134/91 120/73  Pulse:  (!) 107 (!) 112 (!) 107  Resp:   14 16  Temp:      TempSrc:        SpO2:  96% 95% 98%  Weight:       Eyes: PERRL, lids and conjunctivae normal ENMT: Mucous membranes are moist. Posterior pharynx clear of any exudate or lesions.Normal dentition.  Neck: normal, supple, no masses, no thyromegaly Respiratory: clear to auscultation bilaterally, no wheezing, no crackles. Normal respiratory effort. No accessory muscle use.  Cardiovascular: Regular rate and rhythm, no murmurs / rubs / gallops. No extremity edema. 2+ pedal pulses. No carotid bruits.  Abdomen: Decreased bowel sounds, tenderness to deep palpation without any rebound tenderness.  Ostomy bag noted on the right side of the output.  Surgical scar noted on the abdomen  Musculoskeletal: no clubbing / cyanosis. No joint deformity upper and lower extremities. Good ROM, no contractures. Normal muscle tone.  Skin: no rashes, lesions, ulcers. No induration Neurologic: CN 2-12 grossly intact. Sensation intact, DTR normal. Strength 5/5 in all 4.  Psychiatric: Normal judgment and insight. Alert and oriented x 3. Normal mood.     Labs on Admission: I have personally reviewed following labs and imaging studies  CBC: Recent Labs  Lab 06/07/17 1650  WBC 8.1  HGB 14.6  HCT 44.1  MCV 93.4  PLT 275   Basic Metabolic Panel: Recent Labs  Lab 06/07/17 1650  NA 141  K 3.2*  CL 102  CO2 25  GLUCOSE 101*  BUN 34*  CREATININE 1.31*  CALCIUM 10.2   GFR: Estimated Creatinine Clearance: 52.7 mL/min (A) (by C-G formula based on SCr of 1.31 mg/dL (H)). Liver Function Tests: Recent Labs  Lab 06/07/17 1650  AST 33  ALT 24  ALKPHOS 79  BILITOT 0.7  PROT 8.7*  ALBUMIN 4.3   Recent Labs  Lab 06/07/17 1650  LIPASE 52*   No results for input(s): AMMONIA in the last 168 hours. Coagulation Profile: No results for input(s): INR, PROTIME in the last 168 hours. Cardiac Enzymes: No results for input(s): CKTOTAL, CKMB, CKMBINDEX, TROPONINI in the last 168 hours. BNP (last 3 results) No results for input(s):  PROBNP in the last 8760 hours. HbA1C: No results for input(s): HGBA1C in the last 72 hours. CBG: No results for input(s): GLUCAP in the last 168 hours. Lipid Profile: No results for input(s): CHOL, HDL, LDLCALC, TRIG, CHOLHDL, LDLDIRECT in the last 72 hours. Thyroid Function Tests: No results for input(s): TSH, T4TOTAL, FREET4, T3FREE, THYROIDAB in the last 72 hours. Anemia Panel: No results for input(s): VITAMINB12, FOLATE, FERRITIN, TIBC, IRON, RETICCTPCT in the last 72 hours. Urine analysis:    Component Value Date/Time   COLORURINE YELLOW 06/07/2017 2201   APPEARANCEUR CLEAR 06/07/2017 2201   LABSPEC 1.026 06/07/2017 2201   PHURINE 5.0 06/07/2017 2201   GLUCOSEU NEGATIVE 06/07/2017 2201   GLUCOSEU NEGATIVE 02/10/2013 0914   HGBUR NEGATIVE 06/07/2017 2201   BILIRUBINUR NEGATIVE 06/07/2017 2201   KETONESUR NEGATIVE 06/07/2017 2201   PROTEINUR 100 (A) 06/07/2017 2201   UROBILINOGEN 0.2 02/10/2013 0914   NITRITE NEGATIVE 06/07/2017 2201   LEUKOCYTESUR TRACE (A) 06/07/2017 2201   Sepsis Labs: !!!!!!!!!!!!!!!!!!!!!!!!!!!!!!!!!!!!!!!!!!!! @LABRCNTIP (procalcitonin:4,lacticidven:4) )No results found for this or any previous visit (from the past 240 hour(s)).   Radiological Exams on Admission: Dg Abdomen 1 View  Result Date: 06/08/2017 CLINICAL DATA:  NG tube placement EXAM: ABDOMEN - 1 VIEW COMPARISON:  CT abdomen pelvis 06/08/2017 FINDINGS: Tip and side port of the nasogastric tube overlie the stomach. No dilated bowel is visible. IMPRESSION: Tip and side port of the nasogastric tube in the stomach. Electronically Signed   By: Ulyses Jarred M.D.   On: 06/08/2017 05:58   Ct Abdomen Pelvis W Contrast  Result Date: 06/08/2017 CLINICAL DATA:  Abdominal pain and distension. Nausea and vomiting. No output from ostomy. EXAM: CT ABDOMEN AND PELVIS WITH CONTRAST TECHNIQUE: Multidetector CT imaging of the abdomen and pelvis was performed using the standard protocol following bolus  administration of intravenous contrast. CONTRAST:  80 cc Isovue-300 IV COMPARISON:  CT 10/29/2015 FINDINGS: Lower chest: Tiny subpleural nodule in the left lower lobe is unchanged from prior exam and considered benign. No consolidation. No pleural fluid. Hepatobiliary: Scattered subcentimeter hepatic hypodensities are too small to be accurately characterize. Unchanged appearance of gallbladder without  calcified gallstone or biliary dilatation. Pancreas: Pancreatic ductal prominence measuring 6 mm in the body. No peripancreatic inflammation. No evidence of focal pancreatic mass. Spleen: Normal in size without focal abnormality. Adrenals/Urinary Tract: No adrenal nodule. Right renal scarring related to prior trauma. Prior subcapsular hematoma has resolved. Mild left renal cortical scarring that is chronic. No hydronephrosis. No perinephric edema. Absent delayed excretion on delayed imaging. Urinary bladder is near completely decompressed. Stomach/Bowel: Post gastric sleeve resection. Stomach is nondistended. Right lower quadrant ileostomy. Increased size of parastomal hernia. Small bowel obstruction within the parastomal hernia with dilated fecalized small bowel contents in the hernia sac. The small bowel in the central abdomen and lower pelvis are also dilated and fluid-filled. There is associated mesenteric edema in the pelvis. No pneumatosis or perforation. Patient is post total colectomy. Vascular/Lymphatic: Mild aortic atherosclerosis. Small retroperitoneal nodes, not enlarged by size criteria. Reproductive: Probable uterine fibroid is unchanged. No adnexal mass. Other: Small amount of free fluid. Mesenteric edema related to of small bowel dilatation. No free air. Small fat containing midline ventral abdominal wall hernia. Musculoskeletal: There are no acute or suspicious osseous abnormalities. IMPRESSION: 1. Right lower quadrant ileostomy with small bowel obstruction, transition point at the parastomal hernia.  2. Pancreatic ductal prominence of 6 mm, likely sequela of prior pancreatic trauma. 3. Bilateral renal cortical scarring. Absent renal excretion on delayed phase imaging suggest underlying renal dysfunction. Electronically Signed   By: Jeb Levering M.D.   On: 06/08/2017 04:14      Assessment/Plan Active Problems:   SBO (small bowel obstruction) (HCC)    Nausea vomiting Small bowel obstruction at parastomal hernia -Admit the patient to the hospital -NG tube in place to suction - Monitor output via suction, maintain n.p.o. status.  We will perform Accu-Chek every 6 hours - D5 half-normal saline at 75 cc/h, monitor urine output -General surgery consulted-obstruction series ordered - Antiemetics as needed, pain control  Hypokalemia -We will replete electrolytes  Essential hypertension - Holding p.o. hydrochlorothiazide.  If necessary will give IV hydralazine as needed  GERD -Hold oral Protonix, will place her on IV PPI for now  History of ulcerative colitis/Crohn's disease - Total colectomy in 2001  CKD stage III - Creatinine is 1.3 which appears to be close to her baseline of 1.1- 1.3 -We will continue to monitor at this  History of gastric sleeve 2 years ago.   DVT prophylaxis: Subcu heparin Code Status: Full code Family Communication: Daughter at bedside Disposition Plan: To be determined Consults called: General surgery Admission status: Inpatient admission   Kyesha Balla Arsenio Loader MD [facility] Pager [phone]  If 7PM-7AM, please contact night-coverage [email or web address] Password [facility]  06/08/2017, 8:41 AM

## 2017-06-09 ENCOUNTER — Inpatient Hospital Stay (HOSPITAL_COMMUNITY): Payer: Medicare Other

## 2017-06-09 LAB — COMPREHENSIVE METABOLIC PANEL
ALBUMIN: 3.7 g/dL (ref 3.5–5.0)
ALK PHOS: 65 U/L (ref 38–126)
ALT: 21 U/L (ref 14–54)
AST: 19 U/L (ref 15–41)
Anion gap: 11 (ref 5–15)
BUN: 33 mg/dL — ABNORMAL HIGH (ref 6–20)
CALCIUM: 9.5 mg/dL (ref 8.9–10.3)
CHLORIDE: 107 mmol/L (ref 101–111)
CO2: 24 mmol/L (ref 22–32)
Creatinine, Ser: 1.36 mg/dL — ABNORMAL HIGH (ref 0.44–1.00)
GFR calc Af Amer: 48 mL/min — ABNORMAL LOW (ref >60)
GFR calc non Af Amer: 41 mL/min — ABNORMAL LOW (ref >60)
GLUCOSE: 124 mg/dL — AB (ref 65–99)
POTASSIUM: 3.2 mmol/L — AB (ref 3.5–5.1)
SODIUM: 142 mmol/L (ref 135–145)
Total Bilirubin: 0.7 mg/dL (ref 0.3–1.2)
Total Protein: 7.4 g/dL (ref 6.5–8.1)

## 2017-06-09 LAB — GLUCOSE, CAPILLARY
GLUCOSE-CAPILLARY: 127 mg/dL — AB (ref 65–99)
GLUCOSE-CAPILLARY: 109 mg/dL — AB (ref 65–99)
Glucose-Capillary: 103 mg/dL — ABNORMAL HIGH (ref 65–99)
Glucose-Capillary: 113 mg/dL — ABNORMAL HIGH (ref 65–99)

## 2017-06-09 LAB — CBC
HEMATOCRIT: 37.4 % (ref 36.0–46.0)
HEMOGLOBIN: 12.2 g/dL (ref 12.0–15.0)
MCH: 30.8 pg (ref 26.0–34.0)
MCHC: 32.6 g/dL (ref 30.0–36.0)
MCV: 94.4 fL (ref 78.0–100.0)
Platelets: 240 10*3/uL (ref 150–400)
RBC: 3.96 MIL/uL (ref 3.87–5.11)
RDW: 14.8 % (ref 11.5–15.5)
WBC: 6.1 10*3/uL (ref 4.0–10.5)

## 2017-06-09 LAB — HIV ANTIBODY (ROUTINE TESTING W REFLEX): HIV Screen 4th Generation wRfx: NONREACTIVE

## 2017-06-09 LAB — MAGNESIUM: MAGNESIUM: 2.3 mg/dL (ref 1.7–2.4)

## 2017-06-09 LAB — PROTIME-INR
INR: 0.96
Prothrombin Time: 12.6 seconds (ref 11.4–15.2)

## 2017-06-09 LAB — APTT: aPTT: 23 seconds — ABNORMAL LOW (ref 24–36)

## 2017-06-09 MED ORDER — KCL IN DEXTROSE-NACL 40-5-0.45 MEQ/L-%-% IV SOLN
INTRAVENOUS | Status: DC
  Administered 2017-06-09 – 2017-06-10 (×2): via INTRAVENOUS
  Filled 2017-06-09 (×2): qty 1000

## 2017-06-09 MED ORDER — TRAMADOL-ACETAMINOPHEN 37.5-325 MG PO TABS: 2.0000 | ORAL_TABLET | Freq: Four times a day (QID) | ORAL | Status: DC | PRN

## 2017-06-09 MED ORDER — MORPHINE SULFATE (PF) 2 MG/ML IV SOLN
2.0000 mg | INTRAVENOUS | Status: DC | PRN
  Administered 2017-06-09 – 2017-06-12 (×14): 2 mg via INTRAVENOUS
  Filled 2017-06-09 (×16): qty 1

## 2017-06-09 NOTE — Progress Notes (Signed)
Initial Nutrition Assessment  DOCUMENTATION CODES:   Obesity unspecified  INTERVENTION:   Diet advancement per MD  NUTRITION DIAGNOSIS:   Inadequate oral intake related to inability to eat as evidenced by NPO status.  GOAL:   Patient will meet greater than or equal to 90% of their needs  MONITOR:   Diet advancement, Labs, Weight trends, I & O's  REASON FOR ASSESSMENT:   Malnutrition Screening Tool   ASSESSMENT:   62 y.o. female with medical history significant of ulcerative colitis/Crohn's disease status post total colectomy in 2001, gastric sleeve in 2016, essential hypertension, GERD came to the hospital with complains of nausea and vomiting.    Pt currently NPO d/t SBO. NGT placed for suction with 1550 ml output so far today. Pt had N/V or 2 days PTA.   Per chart review, pt's weight is stable. Pt reports 3 lb weight loss.   Medications: Vitamin D tablet daily, Multivitamin with minerals daily, D5-.45% NaCl infusion at 75 ml/hr -provides 306 kcal, IV Zofran PRN  Labs reviewed: CBGs: 113-127 Low K Mg WNL   NUTRITION - FOCUSED PHYSICAL EXAM:  Nutrition focused physical exam shows no sign of depletion of muscle mass or body fat.  Diet Order:  Diet NPO time specified  EDUCATION NEEDS:   No education needs have been identified at this time  Skin:  Skin Assessment: Reviewed RN Assessment  Last BM:  2/19  Height:   Ht Readings from Last 1 Encounters:  06/07/17 5' 5"  (1.651 m)    Weight:   Wt Readings from Last 1 Encounters:  06/07/17 219 lb 6.4 oz (99.5 kg)    Ideal Body Weight:  56.8 kg  BMI:  Body mass index is 36.51 kg/m.  Estimated Nutritional Needs:   Kcal:  1600-1800  Protein:  60-70g  Fluid:  1.8L/day  Emily Bibles, MS, RD, LDN [place] [facility] Pager: [phone] After Hours Pager: [phone]

## 2017-06-09 NOTE — Progress Notes (Signed)
Patient Demographics:    Emily Sanchez, is a 62 y.o. female, DOB - 15-Jan-1956, XFQ:[id number]  Admit date - 06/08/2017   Admitting Physician Ankit Arsenio Loader, MD  Outpatient Primary MD for the patient is Plotnikov, Evie Lacks, MD  LOS - 1   Chief Complaint  Patient presents with  . Emesis  . Abdominal Pain        Subjective:    Emily Sanchez today has no fevers, no emesis,  No chest pain, husband at bedside, questions answered,  Assessment  & Plan :    Active Problems:   SBO (small bowel obstruction) (HCC)  CT AP-  CT of the abdomen pelvis- right lower quadrant ileostomy with small bowel obstruction, transition point apparently stomal hernia, pancreatic ductal prominence, bilateral renal cortical scarring   Brief Summary:- 62 y.o. 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 with NG tube in place, give IV fluids, replace and recheck electrolytes  4)HTN-hold HCTZ until patient is euvolemic (NPO for now),   may use IV Hydralazine 10 mg  Every 4 hours Prn for systolic blood pressure over 160 mmhg  Code Status : Full   Disposition Plan  : TBD  Consults  :  Gen surg   DVT Prophylaxis  :   Heparin   Lab Results  Component Value Date   PLT 240 06/09/2017    Inpatient Medications  Scheduled Meds: . cholecalciferol  1,000 Units Oral Daily  . [START ON 06/15/2017]  cyanocobalamin  1,000 mcg Intramuscular Q14 Days  . heparin  5,000 Units Subcutaneous Q8H  . insulin aspart  0-5 Units Subcutaneous QHS  . insulin aspart  0-9 Units Subcutaneous TID WC  . multivitamin with minerals  1 tablet Oral Daily  . pantoprazole (PROTONIX) IV  40 mg Intravenous Q24H   Continuous Infusions: . dextrose 5 % and 0.45% NaCl 75 mL/hr at 06/09/17 0932   PRN Meds:.acetaminophen **OR** acetaminophen, alum & mag hydroxide-simeth, guaiFENesin-dextromethorphan, hydrALAZINE, hydrocortisone, hydrocortisone cream, lip balm, loratadine, morphine injection, MUSCLE RUB, ondansetron **OR** ondansetron (ZOFRAN) IV, phenol, polyvinyl alcohol, senna-docusate, sodium chloride, traMADol-acetaminophen    Anti-infectives (From admission, onward)   None        Objective:   Vitals:   06/08/17 1600 06/08/17 2131 06/09/17 1038 06/09/17 1450  BP: 115/75 (!) 141/71 124/74 129/76  Pulse: (!) 108 (!) 113 (!) 107 (!) 110  Resp:  20 20 18   Temp: 98.4 F (36.9 C) 99.9 F (37.7 C) 99.2 F (37.3 C) 99.4 F (37.4 C)  TempSrc: Oral Oral Oral Oral  SpO2: 100% 100% 95% 96%  Weight:        Wt Readings from Last 3 Encounters:  06/07/17 99.5 kg (219 lb 6.4 oz)  06/07/17 100.2 kg (221 lb)  02/16/17 101.2 kg (223 lb)     Intake/Output Summary (Last 24 hours) at 06/09/2017 1644  Last data filed at 06/09/2017 1000 Gross per 24 hour  Intake -  Output 1550 ml  Net -1550 ml     Physical Exam  Gen:- Awake Alert,  In no apparent distress  HEENT:- [place].AT, No sclera icterus Nose- NG tube to intermittent wall suction Neck-Supple Neck,No JVD,.  Lungs-  CTAB  CV- S1, S2 normal Abd-  +ve B.Sounds, Abd Soft, stoma bag empty without fecal content  extremity/Skin:- No  edema,    Psych-affect is appropriate Neuro-no new focal deficits   Data Review:   Micro Results No results found for this or any previous visit (from the past 240 hour(s)).  Radiology Reports Dg Abdomen 1 View  Result  Date: 06/08/2017 CLINICAL DATA:  NG tube placement EXAM: ABDOMEN - 1 VIEW COMPARISON:  CT abdomen pelvis 06/08/2017 FINDINGS: Tip and side port of the nasogastric tube overlie the stomach. No dilated bowel is visible. IMPRESSION: Tip and side port of the nasogastric tube in the stomach. Electronically Signed   By: Ulyses Jarred M.D.   On: 06/08/2017 05:58   Ct Abdomen Pelvis W Contrast  Result Date: 06/08/2017 CLINICAL DATA:  Abdominal pain and distension. Nausea and vomiting. No output from ostomy. EXAM: CT ABDOMEN AND PELVIS WITH CONTRAST TECHNIQUE: Multidetector CT imaging of the abdomen and pelvis was performed using the standard protocol following bolus administration of intravenous contrast. CONTRAST:  80 cc Isovue-300 IV COMPARISON:  CT 10/29/2015 FINDINGS: Lower chest: Tiny subpleural nodule in the left lower lobe is unchanged from prior exam and considered benign. No consolidation. No pleural fluid. Hepatobiliary: Scattered subcentimeter hepatic hypodensities are too small to be accurately characterize. Unchanged appearance of gallbladder without calcified gallstone or biliary dilatation. Pancreas: Pancreatic ductal prominence measuring 6 mm in the body. No peripancreatic inflammation. No evidence of focal pancreatic mass. Spleen: Normal in size without focal abnormality. Adrenals/Urinary Tract: No adrenal nodule. Right renal scarring related to prior trauma. Prior subcapsular hematoma has resolved. Mild left renal cortical scarring that is chronic. No hydronephrosis. No perinephric edema. Absent delayed excretion on delayed imaging. Urinary bladder is near completely decompressed. Stomach/Bowel: Post gastric sleeve resection. Stomach is nondistended. Right lower quadrant ileostomy. Increased size of parastomal hernia. Small bowel obstruction within the parastomal hernia with dilated fecalized small bowel contents in the hernia sac. The small bowel in the central abdomen and lower pelvis are also  dilated and fluid-filled. There is associated mesenteric edema in the pelvis. No pneumatosis or perforation. Patient is post total colectomy. Vascular/Lymphatic: Mild aortic atherosclerosis. Small retroperitoneal nodes, not enlarged by size criteria. Reproductive: Probable uterine fibroid is unchanged. No adnexal mass. Other: Small amount of free fluid. Mesenteric edema related to of small bowel dilatation. No free air. Small fat containing midline ventral abdominal wall hernia. Musculoskeletal: There are no acute or suspicious osseous abnormalities. IMPRESSION: 1. Right lower quadrant ileostomy with small bowel obstruction, transition point at the parastomal hernia. 2. Pancreatic ductal prominence of 6 mm, likely sequela of prior pancreatic trauma. 3. Bilateral renal cortical scarring. Absent renal excretion on delayed phase imaging suggest underlying renal dysfunction. Electronically Signed   By: Jeb Levering M.D.   On: 06/08/2017 04:14   Dg Abd Portable 1v-small Bowel Obstruction Protocol-initial, 8 Hr Delay  Result Date: 06/09/2017 CLINICAL DATA:  Small bowel obstruction protocol. 8 hour delayed film. EXAM: PORTABLE ABDOMEN - 1 VIEW COMPARISON:  Radiographs and CT yesterday. FINDINGS: Enteric tube in place with tip and side-port below the diaphragm. Administered enteric contrast within prominent central small bowel. No  evidence of free air. IMPRESSION: Administered enteric contrast within small bowel in the central abdomen. Recommend 24 hour delayed film. Please note this patient is post total colectomy with right lower quadrant ileostomy. 24 hour film should focus on the right abdomen to evaluate for ileostomy transit. Electronically Signed   By: Jeb Levering M.D.   On: 06/09/2017 02:34     CBC Recent Labs  Lab 06/07/17 1650 06/09/17 0415  WBC 8.1 6.1  HGB 14.6 12.2  HCT 44.1 37.4  PLT 264 240  MCV 93.4 94.4  MCH 30.9 30.8  MCHC 33.1 32.6  RDW 14.4 14.8    Chemistries  Recent Labs   Lab 06/07/17 1650 06/09/17 0415  NA 141 142  K 3.2* 3.2*  CL 102 107  CO2 25 24  GLUCOSE 101* 124*  BUN 34* 33*  CREATININE 1.31* 1.36*  CALCIUM 10.2 9.5  MG  --  2.3  AST 33 19  ALT 24 21  ALKPHOS 79 65  BILITOT 0.7 0.7   ------------------------------------------------------------------------------------------------------------------ No results for input(s): CHOL, HDL, LDLCALC, TRIG, CHOLHDL, LDLDIRECT in the last 72 hours.  Lab Results  Component Value Date   HGBA1C 5.9 12/17/2009   ------------------------------------------------------------------------------------------------------------------ No results for input(s): TSH, T4TOTAL, T3FREE, THYROIDAB in the last 72 hours.  Invalid input(s): FREET3 ------------------------------------------------------------------------------------------------------------------ No results for input(s): VITAMINB12, FOLATE, FERRITIN, TIBC, IRON, RETICCTPCT in the last 72 hours.  Coagulation profile Recent Labs  Lab 06/09/17 0415  INR 0.96    No results for input(s): DDIMER in the last 72 hours.  Cardiac Enzymes No results for input(s): CKMB, TROPONINI, MYOGLOBIN in the last 168 hours.  Invalid input(s): CK ------------------------------------------------------------------------------------------------------------------ No results found for: BNP   Roxan Hockey M.D on 06/09/2017 at 4:44 PM  Between 7am to 7pm - Pager - [phone]  After 7pm go to [email or web address] - password [email or web address]  [facility] -  Office  [phone]   Voice Recognition Viviann Spare dictation system was used to create this note, attempts have been made to correct errors. Please contact the author with questions and/or clarifications.

## 2017-06-09 NOTE — Progress Notes (Signed)
    CC: abdominal pain and emesis   Subjective: No real change, she had some nausea this AM.  I worked on the NG and it's OK. Her stoma hernia is still distended and tender.  Nothing in the ostomy bag, no gas or fluid  Objective: Vital signs in last 24 hours: Temp:  [98.4 F (36.9 C)-99.9 F (37.7 C)] 99.9 F (37.7 C) (02/19 2131) Pulse Rate:  [98-113] 113 (02/19 2131) Resp:  [16-20] 20 (02/19 2131) BP: (115-141)/(71-89) 141/71 (02/19 2131) SpO2:  [96 %-100 %] 100 % (02/19 2131) Last BM Date: 06/08/17 NPO 2300 IV 700 NG  Urine not recorded Afebrile, VSS Tachycardic 100's Labs OK , K+ is a little low 3.2 Film:  Administered enteric contrast within small bowel in the central abdomen. Recommend 24 hour delayed film. Please note this patient is post total colectomy with right lower quadrant ileostomy. 24 hour film should focus on the right abdomen to evaluate for ileostomy Transit.    Intake/Output from previous day: 02/19 0701 - 02/20 0700 In: 2300 [I.V.:2000; IV Piggyback:300] Out: 700 [Emesis/NG output:700] Intake/Output this shift: No intake/output data recorded.  General appearance: alert, cooperative and no distress Resp: clear to auscultation bilaterally GI: soft except over the stoma/parastoma hernai, still tender and palpable.   Lab Results:  Recent Labs    06/07/17 1650 06/09/17 0415  WBC 8.1 6.1  HGB 14.6 12.2  HCT 44.1 37.4  PLT 264 240    BMET Recent Labs    06/07/17 1650 06/09/17 0415  NA 141 142  K 3.2* 3.2*  CL 102 107  CO2 25 24  GLUCOSE 101* 124*  BUN 34* 33*  CREATININE 1.31* 1.36*  CALCIUM 10.2 9.5   PT/INR Recent Labs    06/09/17 0415  LABPROT 12.6  INR 0.96    Recent Labs  Lab 06/07/17 1650 06/09/17 0415  AST 33 19  ALT 24 21  ALKPHOS 79 65  BILITOT 0.7 0.7  PROT 8.7* 7.4  ALBUMIN 4.3 3.7     Lipase     Component Value Date/Time   LIPASE 52 (H) 06/07/2017 1650     Medications: . cholecalciferol  1,000  Units Oral Daily  . [START ON 06/15/2017] cyanocobalamin  1,000 mcg Intramuscular Q14 Days  . heparin  5,000 Units Subcutaneous Q8H  . insulin aspart  0-5 Units Subcutaneous QHS  . insulin aspart  0-9 Units Subcutaneous TID WC  . multivitamin with minerals  1 tablet Oral Daily  . pantoprazole (PROTONIX) IV  40 mg Intravenous Q24H   . dextrose 5 % and 0.45% NaCl Stopped (06/08/17 1516)   Anti-infectives (From admission, onward)   None      Assessment/Plan SBO with small bowel in the parastomal hernia sac Crohn's disease - currently not treated  Partial colectomy,around 2000, total colectomy/ileostomy ~2001 at [facility], Gastric sleeve, ventral hernia, hiatal hernia repair 2016, Dr. Hassell Done   Hypertension Fibromyalgia Anemia GERD  FEN: IV fluids/NPO ID:  None DVT: heparin Foley:  None Follow up:  TBD  Plan:  Continue NG suction and allow her some ice chips, I think she is well drained per NG. Will recheck film in AM      LOS: 1 day    Shalon Councilman 06/09/2017 [phone]

## 2017-06-10 ENCOUNTER — Inpatient Hospital Stay (HOSPITAL_COMMUNITY): Payer: Medicare Other

## 2017-06-10 ENCOUNTER — Other Ambulatory Visit: Payer: Self-pay

## 2017-06-10 LAB — GLUCOSE, CAPILLARY
GLUCOSE-CAPILLARY: 104 mg/dL — AB (ref 65–99)
GLUCOSE-CAPILLARY: 107 mg/dL — AB (ref 65–99)
Glucose-Capillary: 101 mg/dL — ABNORMAL HIGH (ref 65–99)
Glucose-Capillary: 102 mg/dL — ABNORMAL HIGH (ref 65–99)
Glucose-Capillary: 92 mg/dL (ref 65–99)
Glucose-Capillary: 99 mg/dL (ref 65–99)

## 2017-06-10 LAB — BASIC METABOLIC PANEL
ANION GAP: 11 (ref 5–15)
BUN: 34 mg/dL — ABNORMAL HIGH (ref 6–20)
CALCIUM: 9.4 mg/dL (ref 8.9–10.3)
CO2: 27 mmol/L (ref 22–32)
Chloride: 106 mmol/L (ref 101–111)
Creatinine, Ser: 1.45 mg/dL — ABNORMAL HIGH (ref 0.44–1.00)
GFR calc Af Amer: 44 mL/min — ABNORMAL LOW (ref >60)
GFR, EST NON AFRICAN AMERICAN: 38 mL/min — AB (ref >60)
GLUCOSE: 129 mg/dL — AB (ref 65–99)
Potassium: 4 mmol/L (ref 3.5–5.1)
SODIUM: 144 mmol/L (ref 135–145)

## 2017-06-10 LAB — CBC
HCT: 39 % (ref 36.0–46.0)
HEMOGLOBIN: 12.3 g/dL (ref 12.0–15.0)
MCH: 30.5 pg (ref 26.0–34.0)
MCHC: 31.5 g/dL (ref 30.0–36.0)
MCV: 96.8 fL (ref 78.0–100.0)
Platelets: 252 10*3/uL (ref 150–400)
RBC: 4.03 MIL/uL (ref 3.87–5.11)
RDW: 14.9 % (ref 11.5–15.5)
WBC: 4.7 10*3/uL (ref 4.0–10.5)

## 2017-06-10 MED ORDER — LACTATED RINGERS IV BOLUS (SEPSIS)
1000.0000 mL | Freq: Once | INTRAVENOUS | Status: AC
  Administered 2017-06-10: 1000 mL via INTRAVENOUS

## 2017-06-10 MED ORDER — KCL IN DEXTROSE-NACL 20-5-0.9 MEQ/L-%-% IV SOLN
INTRAVENOUS | Status: DC
  Administered 2017-06-10 – 2017-06-13 (×7): via INTRAVENOUS
  Filled 2017-06-10 (×13): qty 1000

## 2017-06-10 MED ORDER — INSULIN ASPART 100 UNIT/ML ~~LOC~~ SOLN
0.0000 [IU] | SUBCUTANEOUS | Status: DC
  Administered 2017-06-10 – 2017-06-11 (×3): 0 [IU] via SUBCUTANEOUS
  Administered 2017-06-11 – 2017-06-13 (×4): 1 [IU] via SUBCUTANEOUS

## 2017-06-10 NOTE — Progress Notes (Signed)
CC:  abdominal pain and emesis  Subjective: No improvement, fluid bolus has not been started.  She thinks parastomal hernia is a little softer, but nothing in ostomy bag yesterday, she has a little gas in it this AM.    Objective: Vital signs in last 24 hours: Temp:  [98.3 F (36.8 C)-99.4 F (37.4 C)] 98.3 F (36.8 C) (02/21 0456) Pulse Rate:  [107-112] 111 (02/21 0456) Resp:  [16-20] 18 (02/21 0456) BP: (124-136)/(72-80) 136/80 (02/21 0456) SpO2:  [95 %-96 %] 95 % (02/21 0456) Last BM Date: 06/08/17  NPO Voided x 2 - no volume recorded 1550 from the NG  IV not recorded TM 99.4, Tachycardia, HR 100's Creatinine continues to creep up  Film this AM:  NG tube noted with tip below left hemidiaphragm. Surgical sutures are noted over the left abdomen. Worsening distention of small-bowel loops. Residual enteric contrast noted in mid abdominal and right lower quadrant small-bowel loops. These findings are consistent with persistent small-bowel obstruction. No free air. No acute bony abnormality   Intake/Output from previous day: 02/20 0701 - 02/21 0700 In: 0  Out: 1550 [Emesis/NG output:1550] Intake/Output this shift: No intake/output data recorded.  General appearance: alert, cooperative and no distress Resp: clear to auscultation bilaterally GI: parastomal hernia is about the same on my exam, nothing but some gas in the ostomy.  NG is green.   Lab Results:  Recent Labs    06/09/17 0415 06/10/17 0340  WBC 6.1 4.7  HGB 12.2 12.3  HCT 37.4 39.0  PLT 240 252    BMET Recent Labs    06/09/17 0415 06/10/17 0340  NA 142 144  K 3.2* 4.0  CL 107 106  CO2 24 27  GLUCOSE 124* 129*  BUN 33* 34*  CREATININE 1.36* 1.45*  CALCIUM 9.5 9.4   PT/INR Recent Labs    06/09/17 0415  LABPROT 12.6  INR 0.96    Recent Labs  Lab 06/07/17 1650 06/09/17 0415  AST 33 19  ALT 24 21  ALKPHOS 79 65  BILITOT 0.7 0.7  PROT 8.7* 7.4  ALBUMIN 4.3 3.7     Lipase      Component Value Date/Time   LIPASE 52 (H) 06/07/2017 1650     Medications: . cholecalciferol  1,000 Units Oral Daily  . [START ON 06/15/2017] cyanocobalamin  1,000 mcg Intramuscular Q14 Days  . heparin  5,000 Units Subcutaneous Q8H  . insulin aspart  0-5 Units Subcutaneous QHS  . insulin aspart  0-9 Units Subcutaneous TID WC  . multivitamin with minerals  1 tablet Oral Daily  . pantoprazole (PROTONIX) IV  40 mg Intravenous Q24H   . dextrose 5 % and 0.45 % NaCl with KCl 40 mEq/L 100 mL/hr at 06/10/17 0433  . lactated ringers      Assessment/Plan Hypertension Fibromyalgia Anemia GERD  SBO with small bowel in the parastomal hernia sac - admit 06/08/16 Crohn's disease - currently not treated  Partial colectomy,around 2000, total colectomy/ileostomy ~2001 at [facility], Gastric sleeve, ventral hernia, hiatal hernia repair 2016, Dr. Hassell Done  - NPO, 1550 from NG, nothing from the ostomy, fluid bolus given this AM, Progressive      small-bowel distention. Residual enteric contrastnoted in the mid abdominal and right    lower quadrant small bowel loops on ABX this AM.  Mild elevation of creatinine/dehydration - fluid bolus given  FEN: IV fluids/NPO ID:  None DVT: heparin Foley:  None Follow up:  TBD  Plan:  Continue NG,  she has a fluid bolus ordered, I have discussed with the nurse, and I have increased the fluid rate.  She has only walked in the room.  I will try to get her walking in the halls.  Work on IS today, recheck labs in AM.  If no improvement she will need to go to surgery tomorrow.        LOS: 2 days    Zayvian Mcmurtry 06/10/2017 [phone]

## 2017-06-10 NOTE — Progress Notes (Signed)
Patient Demographics:    Emily Sanchez, is a 62 y.o. female, DOB - [birth year], XKP:[id number]  Admit date - 06/08/2017   Admitting Physician Ankit Arsenio Loader, MD  Outpatient Primary MD for the patient is Plotnikov, Evie Lacks, MD  LOS - 2  Chief Complaint  Patient presents with  . Emesis  . Abdominal Pain        Subjective:    [name] today has no fevers, no emesis,  No chest pain, voiding well,  Assessment  & Plan :    Active Problems:   SBO (small bowel obstruction) (HCC)  CT AP-  CT of the abdomen pelvis- right lower quadrant ileostomy with small bowel obstruction, transition point apparently stomal hernia, pancreatic ductal prominence, bilateral renal cortical scarring   Brief Summary:- 62 y.o. 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 with NG tube in place, c/n  IV fluids, replace and recheck electrolytes  4)HTN-hold HCTZ until patient is euvolemic (NPO for now),  may use IV Hydralazine 10 mg  Every 4 hours Prn for systolic blood pressure over 160 mmhg  Code Status : Full  Disposition Plan  : TBD  Consults  :  Gen surg   DVT Prophylaxis  :   Heparin (hold for surgery), use SCD  Lab Results  Component Value Date   PLT 252 06/10/2017    Inpatient  Medications  Scheduled Meds: . cholecalciferol  1,000 Units Oral Daily  . [START ON 06/15/2017] cyanocobalamin  1,000 mcg Intramuscular Q14 Days  . heparin  5,000 Units Subcutaneous Q8H  . insulin aspart  0-5 Units Subcutaneous QHS  . insulin aspart  0-9 Units Subcutaneous Q4H  . multivitamin with minerals  1 tablet Oral Daily  . pantoprazole (PROTONIX) IV  40 mg Intravenous Q24H   Continuous Infusions: . dextrose 5 % and 0.9 % NaCl with KCl 20 mEq/L 125 mL/hr at 06/10/17 0927   PRN Meds:.acetaminophen **OR** acetaminophen, alum & mag hydroxide-simeth, guaiFENesin-dextromethorphan, hydrALAZINE, hydrocortisone, hydrocortisone cream, lip balm, loratadine, morphine injection, MUSCLE RUB, ondansetron **OR** ondansetron (ZOFRAN) IV, phenol, polyvinyl alcohol, senna-docusate, sodium chloride, traMADol-acetaminophen    Anti-infectives (From admission, onward)   None        Objective:   Vitals:   06/09/17 1450 06/09/17 2122 06/10/17 0456 06/10/17 1437  BP: 129/76 128/72 136/80 123/83  Pulse: (!) 110 (!) 112 (!) 111 (!) 103  Resp: 18 16 18 18   Temp: 99.4 F (37.4 C) 99.4 F (37.4 C) 98.3 F (36.8 C) 99.7 F (37.6 C)  TempSrc: Oral Oral Oral Oral  SpO2: 96% 96% 95% 97%  Weight:        Wt Readings from Last 3 Encounters:  06/07/17 99.5 kg (219 lb 6.4 oz)  06/07/17 100.2 kg (  221 lb)  02/16/17 101.2 kg (223 lb)     Intake/Output Summary (Last 24 hours) at 06/10/2017 1820 Last data filed at 06/10/2017 1745 Gross per 24 hour  Intake 0 ml  Output 1000 ml  Net -1000 ml   Physical Exam Gen:- Awake Alert,  In no apparent distress  HEENT:- [place].AT, No sclera icterus Nose- NG tube to intermittent wall suction Neck-Supple Neck,No JVD,.  Lungs-  CTAB  CV- S1, S2 normal Abd-  +ve B.Sounds, Abd Soft, stoma bag empty without fecal content  extremity/Skin:- No  edema,    Psych-affect is appropriate Neuro-no new focal deficits   Data Review:   Micro Results No results found for  this or any previous visit (from the past 240 hour(s)).  Radiology Reports Dg Abd 1 View  Result Date: 06/10/2017 CLINICAL DATA:  Small-bowel obstruction. EXAM: ABDOMEN - 1 VIEW COMPARISON:  06/09/2017.  06/08/2017.  CT 06/08/2017. FINDINGS: NG tube noted with tip below left hemidiaphragm. Surgical sutures are noted over the left abdomen. Worsening distention of small-bowel loops. Residual enteric contrast noted in mid abdominal and right lower quadrant small-bowel loops. These findings are consistent with persistent small-bowel obstruction. No free air. No acute bony abnormality IMPRESSION: 1.  NG tube noted with its tip over the stomach. 2. Progressive small-bowel distention. Residual enteric contrast noted in the mid abdominal and right lower quadrant small bowel loops. These findings are consistent with persistent small-bowel obstruction. Electronically Signed   By: Marcello Moores  Register   On: 06/10/2017 07:19   Dg Abdomen 1 View  Result Date: 06/08/2017 CLINICAL DATA:  NG tube placement EXAM: ABDOMEN - 1 VIEW COMPARISON:  CT abdomen pelvis 06/08/2017 FINDINGS: Tip and side port of the nasogastric tube overlie the stomach. No dilated bowel is visible. IMPRESSION: Tip and side port of the nasogastric tube in the stomach. Electronically Signed   By: Ulyses Jarred M.D.   On: 06/08/2017 05:58   Ct Abdomen Pelvis W Contrast  Result Date: 06/08/2017 CLINICAL DATA:  Abdominal pain and distension. Nausea and vomiting. No output from ostomy. EXAM: CT ABDOMEN AND PELVIS WITH CONTRAST TECHNIQUE: Multidetector CT imaging of the abdomen and pelvis was performed using the standard protocol following bolus administration of intravenous contrast. CONTRAST:  80 cc Isovue-300 IV COMPARISON:  CT 10/29/2015 FINDINGS: Lower chest: Tiny subpleural nodule in the left lower lobe is unchanged from prior exam and considered benign. No consolidation. No pleural fluid. Hepatobiliary: Scattered subcentimeter hepatic hypodensities  are too small to be accurately characterize. Unchanged appearance of gallbladder without calcified gallstone or biliary dilatation. Pancreas: Pancreatic ductal prominence measuring 6 mm in the body. No peripancreatic inflammation. No evidence of focal pancreatic mass. Spleen: Normal in size without focal abnormality. Adrenals/Urinary Tract: No adrenal nodule. Right renal scarring related to prior trauma. Prior subcapsular hematoma has resolved. Mild left renal cortical scarring that is chronic. No hydronephrosis. No perinephric edema. Absent delayed excretion on delayed imaging. Urinary bladder is near completely decompressed. Stomach/Bowel: Post gastric sleeve resection. Stomach is nondistended. Right lower quadrant ileostomy. Increased size of parastomal hernia. Small bowel obstruction within the parastomal hernia with dilated fecalized small bowel contents in the hernia sac. The small bowel in the central abdomen and lower pelvis are also dilated and fluid-filled. There is associated mesenteric edema in the pelvis. No pneumatosis or perforation. Patient is post total colectomy. Vascular/Lymphatic: Mild aortic atherosclerosis. Small retroperitoneal nodes, not enlarged by size criteria. Reproductive: Probable uterine fibroid is unchanged. No adnexal mass. Other: Small amount of free  fluid. Mesenteric edema related to of small bowel dilatation. No free air. Small fat containing midline ventral abdominal wall hernia. Musculoskeletal: There are no acute or suspicious osseous abnormalities. IMPRESSION: 1. Right lower quadrant ileostomy with small bowel obstruction, transition point at the parastomal hernia. 2. Pancreatic ductal prominence of 6 mm, likely sequela of prior pancreatic trauma. 3. Bilateral renal cortical scarring. Absent renal excretion on delayed phase imaging suggest underlying renal dysfunction. Electronically Signed   By: Jeb Levering M.D.   On: 06/08/2017 04:14   Dg Abd Portable 1v-small Bowel  Obstruction Protocol-initial, 8 Hr Delay  Result Date: 06/09/2017 CLINICAL DATA:  Small bowel obstruction protocol. 8 hour delayed film. EXAM: PORTABLE ABDOMEN - 1 VIEW COMPARISON:  Radiographs and CT yesterday. FINDINGS: Enteric tube in place with tip and side-port below the diaphragm. Administered enteric contrast within prominent central small bowel. No evidence of free air. IMPRESSION: Administered enteric contrast within small bowel in the central abdomen. Recommend 24 hour delayed film. Please note this patient is post total colectomy with right lower quadrant ileostomy. 24 hour film should focus on the right abdomen to evaluate for ileostomy transit. Electronically Signed   By: Jeb Levering M.D.   On: 06/09/2017 02:34     CBC Recent Labs  Lab 06/07/17 1650 06/09/17 0415 06/10/17 0340  WBC 8.1 6.1 4.7  HGB 14.6 12.2 12.3  HCT 44.1 37.4 39.0  PLT 264 240 252  MCV 93.4 94.4 96.8  MCH 30.9 30.8 30.5  MCHC 33.1 32.6 31.5  RDW 14.4 14.8 14.9    Chemistries  Recent Labs  Lab 06/07/17 1650 06/09/17 0415 06/10/17 0340  NA 141 142 144  K 3.2* 3.2* 4.0  CL 102 107 106  CO2 25 24 27   GLUCOSE 101* 124* 129*  BUN 34* 33* 34*  CREATININE 1.31* 1.36* 1.45*  CALCIUM 10.2 9.5 9.4  MG  --  2.3  --   AST 33 19  --   ALT 24 21  --   ALKPHOS 79 65  --   BILITOT 0.7 0.7  --    ------------------------------------------------------------------------------------------------------------------ No results for input(s): CHOL, HDL, LDLCALC, TRIG, CHOLHDL, LDLDIRECT in the last 72 hours.  Lab Results  Component Value Date   HGBA1C 5.9 12/17/2009   ------------------------------------------------------------------------------------------------------------------ No results for input(s): TSH, T4TOTAL, T3FREE, THYROIDAB in the last 72 hours.  Invalid input(s): FREET3 ------------------------------------------------------------------------------------------------------------------ No  results for input(s): VITAMINB12, FOLATE, FERRITIN, TIBC, IRON, RETICCTPCT in the last 72 hours.  Coagulation profile Recent Labs  Lab 06/09/17 0415  INR 0.96    No results for input(s): DDIMER in the last 72 hours.  Cardiac Enzymes No results for input(s): CKMB, TROPONINI, MYOGLOBIN in the last 168 hours.  Invalid input(s): CK ------------------------------------------------------------------------------------------------------------------ No results found for: BNP   Roxan Hockey M.D on 06/10/2017 at 6:20 PM  Between 7am to 7pm - Pager - [phone]  After 7pm go to [email or web address] - password [email or web address]  [facility] -  Office  [phone]   Voice Recognition Viviann Spare dictation system was used to create this note, attempts have been made to correct errors. Please contact the author with questions and/or clarifications.

## 2017-06-11 ENCOUNTER — Inpatient Hospital Stay (HOSPITAL_COMMUNITY): Payer: Medicare Other

## 2017-06-11 ENCOUNTER — Encounter (HOSPITAL_COMMUNITY): Payer: Self-pay | Admitting: Certified Registered"

## 2017-06-11 ENCOUNTER — Encounter (HOSPITAL_COMMUNITY)
Admission: EM | Disposition: A | Discharge status: Home / Self Care | Payer: Self-pay | Source: Home / Self Care | Attending: Family Medicine

## 2017-06-11 ENCOUNTER — Inpatient Hospital Stay (HOSPITAL_COMMUNITY): Payer: Medicare Other | Admitting: Anesthesiology

## 2017-06-11 HISTORY — PX: LAPAROTOMY: SHX154

## 2017-06-11 LAB — CBC
HCT: 34.7 % — ABNORMAL LOW (ref 36.0–46.0)
HEMOGLOBIN: 11 g/dL — AB (ref 12.0–15.0)
MCH: 30.1 pg (ref 26.0–34.0)
MCHC: 31.7 g/dL (ref 30.0–36.0)
MCV: 95.1 fL (ref 78.0–100.0)
PLATELETS: 235 10*3/uL (ref 150–400)
RBC: 3.65 MIL/uL — AB (ref 3.87–5.11)
RDW: 14.6 % (ref 11.5–15.5)
WBC: 6.1 10*3/uL (ref 4.0–10.5)

## 2017-06-11 LAB — COMPREHENSIVE METABOLIC PANEL
ALBUMIN: 3.1 g/dL — AB (ref 3.5–5.0)
ALK PHOS: 76 U/L (ref 38–126)
ALT: 15 U/L (ref 14–54)
AST: 15 U/L (ref 15–41)
Anion gap: 10 (ref 5–15)
BUN: 31 mg/dL — ABNORMAL HIGH (ref 6–20)
CALCIUM: 9.2 mg/dL (ref 8.9–10.3)
CHLORIDE: 110 mmol/L (ref 101–111)
CO2: 24 mmol/L (ref 22–32)
CREATININE: 1.34 mg/dL — AB (ref 0.44–1.00)
GFR calc Af Amer: 48 mL/min — ABNORMAL LOW (ref >60)
GFR calc non Af Amer: 42 mL/min — ABNORMAL LOW (ref >60)
GLUCOSE: 119 mg/dL — AB (ref 65–99)
Potassium: 4.3 mmol/L (ref 3.5–5.1)
SODIUM: 144 mmol/L (ref 135–145)
Total Bilirubin: 0.6 mg/dL (ref 0.3–1.2)
Total Protein: 7.1 g/dL (ref 6.5–8.1)

## 2017-06-11 LAB — PREALBUMIN: Prealbumin: 21.2 mg/dL (ref 18–38)

## 2017-06-11 LAB — GLUCOSE, CAPILLARY
GLUCOSE-CAPILLARY: 111 mg/dL — AB (ref 65–99)
GLUCOSE-CAPILLARY: 114 mg/dL — AB (ref 65–99)
GLUCOSE-CAPILLARY: 76 mg/dL (ref 65–99)
GLUCOSE-CAPILLARY: 122 mg/dL — AB (ref 65–99)
Glucose-Capillary: 112 mg/dL — ABNORMAL HIGH (ref 65–99)
Glucose-Capillary: 106 mg/dL — ABNORMAL HIGH (ref 65–99)

## 2017-06-11 LAB — MRSA PCR SCREENING: MRSA by PCR: NEGATIVE

## 2017-06-11 SURGERY — LAPAROTOMY, EXPLORATORY
Anesthesia: General

## 2017-06-11 MED ORDER — MORPHINE SULFATE (PF) 2 MG/ML IV SOLN
2.0000 mg | Freq: Once | INTRAVENOUS | Status: AC
  Administered 2017-06-11: 2 mg via INTRAVENOUS

## 2017-06-11 MED ORDER — PROPOFOL 10 MG/ML IV BOLUS
INTRAVENOUS | Status: DC | PRN
  Administered 2017-06-11: 100 mg via INTRAVENOUS

## 2017-06-11 MED ORDER — FENTANYL CITRATE (PF) 250 MCG/5ML IJ SOLN
INTRAMUSCULAR | Status: AC
  Filled 2017-06-11: qty 5

## 2017-06-11 MED ORDER — MUPIROCIN 2 % EX OINT
TOPICAL_OINTMENT | Freq: Two times a day (BID) | CUTANEOUS | Status: DC
  Administered 2017-06-11 – 2017-06-16 (×9): via NASAL
  Filled 2017-06-11 (×3): qty 22

## 2017-06-11 MED ORDER — LACTATED RINGERS IV SOLN: INTRAVENOUS | Status: DC

## 2017-06-11 MED ORDER — LIDOCAINE 2% (20 MG/ML) 5 ML SYRINGE
INTRAMUSCULAR | Status: DC | PRN
  Administered 2017-06-11: 100 mg via INTRAVENOUS

## 2017-06-11 MED ORDER — ONDANSETRON HCL 4 MG/2ML IJ SOLN
INTRAMUSCULAR | Status: DC | PRN
  Administered 2017-06-11: 4 mg via INTRAVENOUS

## 2017-06-11 MED ORDER — SUGAMMADEX SODIUM 200 MG/2ML IV SOLN
INTRAVENOUS | Status: DC | PRN
  Administered 2017-06-11: 200 mg via INTRAVENOUS

## 2017-06-11 MED ORDER — HYDROMORPHONE HCL 1 MG/ML IJ SOLN
0.2500 mg | INTRAMUSCULAR | Status: DC | PRN
  Administered 2017-06-11 (×4): 0.5 mg via INTRAVENOUS

## 2017-06-11 MED ORDER — SUGAMMADEX SODIUM 200 MG/2ML IV SOLN
INTRAVENOUS | Status: AC
  Filled 2017-06-11: qty 2

## 2017-06-11 MED ORDER — ROCURONIUM BROMIDE 10 MG/ML (PF) SYRINGE
PREFILLED_SYRINGE | INTRAVENOUS | Status: DC | PRN
  Administered 2017-06-11: 30 mg via INTRAVENOUS

## 2017-06-11 MED ORDER — SUCCINYLCHOLINE CHLORIDE 200 MG/10ML IV SOSY
PREFILLED_SYRINGE | INTRAVENOUS | Status: DC | PRN
  Administered 2017-06-11: 120 mg via INTRAVENOUS

## 2017-06-11 MED ORDER — SUCCINYLCHOLINE CHLORIDE 200 MG/10ML IV SOSY
PREFILLED_SYRINGE | INTRAVENOUS | Status: AC
  Filled 2017-06-11: qty 10

## 2017-06-11 MED ORDER — DEXAMETHASONE SODIUM PHOSPHATE 10 MG/ML IJ SOLN
INTRAMUSCULAR | Status: DC | PRN
  Administered 2017-06-11: 10 mg via INTRAVENOUS

## 2017-06-11 MED ORDER — ONDANSETRON HCL 4 MG/2ML IJ SOLN
INTRAMUSCULAR | Status: AC
  Filled 2017-06-11: qty 2

## 2017-06-11 MED ORDER — MIDAZOLAM HCL 5 MG/5ML IJ SOLN
INTRAMUSCULAR | Status: DC | PRN
  Administered 2017-06-11: 2 mg via INTRAVENOUS

## 2017-06-11 MED ORDER — DEXAMETHASONE SODIUM PHOSPHATE 10 MG/ML IJ SOLN
INTRAMUSCULAR | Status: AC
  Filled 2017-06-11: qty 1

## 2017-06-11 MED ORDER — CEFAZOLIN SODIUM-DEXTROSE 2-4 GM/100ML-% IV SOLN
2.0000 g | INTRAVENOUS | Status: AC
  Administered 2017-06-11: 2 g via INTRAVENOUS
  Filled 2017-06-11: qty 100

## 2017-06-11 MED ORDER — LIDOCAINE 2% (20 MG/ML) 5 ML SYRINGE
INTRAMUSCULAR | Status: AC
  Filled 2017-06-11: qty 5

## 2017-06-11 MED ORDER — 0.9 % SODIUM CHLORIDE (POUR BTL) OPTIME
TOPICAL | Status: DC | PRN
  Administered 2017-06-11: 1000 mL

## 2017-06-11 MED ORDER — MIDAZOLAM HCL 2 MG/2ML IJ SOLN
INTRAMUSCULAR | Status: AC
  Filled 2017-06-11: qty 2

## 2017-06-11 MED ORDER — LORAZEPAM 2 MG/ML IJ SOLN
1.0000 mg | Freq: Once | INTRAMUSCULAR | Status: AC
  Administered 2017-06-11: 1 mg via INTRAVENOUS
  Filled 2017-06-11: qty 1

## 2017-06-11 MED ORDER — ROCURONIUM BROMIDE 10 MG/ML (PF) SYRINGE
PREFILLED_SYRINGE | INTRAVENOUS | Status: AC
  Filled 2017-06-11: qty 5

## 2017-06-11 MED ORDER — HYDROMORPHONE HCL 1 MG/ML IJ SOLN
INTRAMUSCULAR | Status: AC
  Filled 2017-06-11: qty 1

## 2017-06-11 MED ORDER — FENTANYL CITRATE (PF) 100 MCG/2ML IJ SOLN
INTRAMUSCULAR | Status: DC | PRN
  Administered 2017-06-11 (×4): 50 ug via INTRAVENOUS

## 2017-06-11 MED ORDER — LACTATED RINGERS IV SOLN
INTRAVENOUS | Status: DC | PRN
  Administered 2017-06-11: 1000 mL
  Administered 2017-06-11 (×2): via INTRAVENOUS

## 2017-06-11 MED ORDER — PROPOFOL 10 MG/ML IV BOLUS
INTRAVENOUS | Status: AC
  Filled 2017-06-11: qty 20

## 2017-06-11 SURGICAL SUPPLY — 49 items
BLADE EXTENDED COATED 6.5IN (ELECTRODE) IMPLANT
CELLS DAT CNTRL 66122 CELL SVR (MISCELLANEOUS) IMPLANT
CHLORAPREP W/TINT 26ML (MISCELLANEOUS) IMPLANT
COUNTER NEEDLE 20 DBL MAG RED (NEEDLE) IMPLANT
COVER MAYO STAND STRL (DRAPES) IMPLANT
COVER SURGICAL LIGHT HANDLE (MISCELLANEOUS) ×3 IMPLANT
DRAIN CHANNEL 19F RND (DRAIN) IMPLANT
DRAPE LAPAROSCOPIC ABDOMINAL (DRAPES) ×3 IMPLANT
DRAPE SHEET LG 3/4 BI-LAMINATE (DRAPES) IMPLANT
DRSG OPSITE POSTOP 4X10 (GAUZE/BANDAGES/DRESSINGS) ×3 IMPLANT
DRSG OPSITE POSTOP 4X6 (GAUZE/BANDAGES/DRESSINGS) IMPLANT
DRSG OPSITE POSTOP 4X8 (GAUZE/BANDAGES/DRESSINGS) IMPLANT
ELECT PENCIL ROCKER SW 15FT (MISCELLANEOUS) IMPLANT
ELECT REM PT RETURN 15FT ADLT (MISCELLANEOUS) ×3 IMPLANT
EVACUATOR SILICONE 100CC (DRAIN) IMPLANT
GAUZE SPONGE 4X4 12PLY STRL (GAUZE/BANDAGES/DRESSINGS) IMPLANT
GLOVE BIO SURGEON STRL SZ 6.5 (GLOVE) ×2 IMPLANT
GLOVE BIO SURGEONS STRL SZ 6.5 (GLOVE) ×1
GLOVE BIOGEL PI IND STRL 7.0 (GLOVE) ×1 IMPLANT
GLOVE BIOGEL PI INDICATOR 7.0 (GLOVE) ×2
GOWN STRL REUS W/TWL 2XL LVL3 (GOWN DISPOSABLE) ×3 IMPLANT
GOWN STRL REUS W/TWL XL LVL3 (GOWN DISPOSABLE) IMPLANT
HANDLE SUCTION POOLE (INSTRUMENTS) IMPLANT
LEGGING LITHOTOMY PAIR STRL (DRAPES) IMPLANT
NS IRRIG 1000ML POUR BTL (IV SOLUTION) ×3 IMPLANT
PACK COLON (CUSTOM PROCEDURE TRAY) ×3 IMPLANT
RETRACTOR WND ALEXIS 25 LRG (MISCELLANEOUS) IMPLANT
RTRCTR WOUND ALEXIS 18CM MED (MISCELLANEOUS)
RTRCTR WOUND ALEXIS 25CM LRG (MISCELLANEOUS)
SEALER TISSUE X1 CVD JAW (INSTRUMENTS) IMPLANT
SPONGE LAP 18X18 X RAY DECT (DISPOSABLE) ×6 IMPLANT
STAPLER VISISTAT 35W (STAPLE) IMPLANT
SUCTION POOLE HANDLE (INSTRUMENTS)
SUT ETHILON 3 0 PS 1 (SUTURE) IMPLANT
SUT NOVA 1 T20/GS 25DT (SUTURE) ×9 IMPLANT
SUT NOVA NAB GS-21 0 18 T12 DT (SUTURE) ×3 IMPLANT
SUT PDS AB 1 CTX 36 (SUTURE) IMPLANT
SUT SILK 2 0 (SUTURE) ×2
SUT SILK 2 0 SH CR/8 (SUTURE) ×3 IMPLANT
SUT SILK 2-0 18XBRD TIE 12 (SUTURE) ×1 IMPLANT
SUT SILK 3 0 (SUTURE) ×2
SUT SILK 3 0 SH CR/8 (SUTURE) ×3 IMPLANT
SUT SILK 3-0 18XBRD TIE 12 (SUTURE) ×1 IMPLANT
SUT VIC AB 2-0 SH 18 (SUTURE) ×6 IMPLANT
SUT VIC AB 4-0 PS2 27 (SUTURE) ×3 IMPLANT
TOWEL OR 17X26 10 PK STRL BLUE (TOWEL DISPOSABLE) IMPLANT
TOWEL OR NON WOVEN STRL DISP B (DISPOSABLE) ×3 IMPLANT
TRAY FOLEY W/METER SILVER 16FR (SET/KITS/TRAYS/PACK) IMPLANT
YANKAUER SUCT BULB TIP 10FT TU (MISCELLANEOUS) ×3 IMPLANT

## 2017-06-11 NOTE — Transfer of Care (Signed)
Immediate Anesthesia Transfer of Care Note  Patient: Emily Sanchez  Procedure(s) Performed: EXPLORATORY LAPAROTOMY, LYSIS OF ADHESIONS, REPAIR OF PEISTOMAL HERNIA (N/A )  Patient Location: PACU  Anesthesia Type:General  Level of Consciousness: awake, alert  and oriented  Airway & Oxygen Therapy: Patient Spontanous Breathing and Patient connected to face mask oxygen  Post-op Assessment: Report given to RN and Post -op Vital signs reviewed and stable  Post vital signs: Reviewed and stable  Last Vitals:  Vitals:   06/11/17 0419 06/11/17 0911  BP: (!) 143/87 130/90  Pulse: (!) 108 (!) 101  Resp: 16 16  Temp: 36.7 C 36.7 C  SpO2: 99% 98%    Last Pain:  Vitals:   06/11/17 0911  TempSrc: Oral  PainSc:       Patients Stated Pain Goal: 0 (04/59/97 7414)  Complications: No apparent anesthesia complications

## 2017-06-11 NOTE — Anesthesia Preprocedure Evaluation (Addendum)
Anesthesia Evaluation  Patient identified by MRN, date of birth, ID band Patient awake    Reviewed: Allergy & Precautions, H&P , NPO status , [name] Chart, lab work & pertinent test results  Airway Mallampati: III  TM Distance: >3 FB Neck ROM: Full    Dental no notable dental hx. (+) Teeth Intact, Dental Advisory Given   Pulmonary neg pulmonary ROS, former smoker,    Pulmonary exam normal breath sounds clear to auscultation       Cardiovascular hypertension, Pt. on medications  Rhythm:Regular Rate:Normal     Neuro/Psych Anxiety Depression negative neurological ROS     GI/Hepatic Neg liver ROS, PUD, GERD  Medicated and Controlled,  Endo/Other  negative endocrine ROS  Renal/GU Renal InsufficiencyRenal disease  negative genitourinary   Musculoskeletal  (+) Arthritis , Osteoarthritis,    Abdominal   Peds  Hematology negative hematology ROS (+) anemia ,   Anesthesia Other Findings   Reproductive/Obstetrics negative OB ROS                            Anesthesia Physical Anesthesia Plan  ASA: III  Anesthesia Plan: General   Post-op Pain Management:    Induction: Intravenous, Rapid sequence and Cricoid pressure planned  PONV Risk Score and Plan: 4 or greater and Ondansetron, Dexamethasone and Midazolam  Airway Management Planned: Oral ETT  Additional Equipment:   Intra-op Plan:   Post-operative Plan: Extubation in OR  Informed Consent: I have reviewed the patients History and Physical, chart, labs and discussed the procedure including the risks, benefits and alternatives for the proposed anesthesia with the patient or authorized representative who has indicated his/her understanding and acceptance.   Dental advisory given  Plan Discussed with: CRNA  Anesthesia Plan Comments:         Anesthesia Quick Evaluation

## 2017-06-11 NOTE — Progress Notes (Signed)
    CC:  abdominal pain and emesis  Subjective: No improvement, pain about the same  Objective: Vital signs in last 24 hours: Temp:  [98.1 F (36.7 C)-99.7 F (37.6 C)] 98.1 F (36.7 C) (02/22 0419) Pulse Rate:  [75-108] 108 (02/22 0419) Resp:  [16-18] 16 (02/22 0419) BP: (123-143)/(82-87) 143/87 (02/22 0419) SpO2:  [94 %-99 %] 99 % (02/22 0419) Weight:  [103.7 kg (228 lb 11.2 oz)] 103.7 kg (228 lb 11.2 oz) (02/22 0419) Last BM Date: 06/08/17   Intake/Output from previous day: 02/21 0701 - 02/22 0700 In: 2193.8 [I.V.:2193.8] Out: 500 [Urine:300; Emesis/NG output:200] Intake/Output this shift: No intake/output data recorded.  General appearance: alert, cooperative and no distress Resp: clear to auscultation bilaterally GI: parastomal hernia is about the same on my exam, nothing in the ostomy.  NG is bilious.   Lab Results:  Recent Labs    06/10/17 0340 06/11/17 0352  WBC 4.7 6.1  HGB 12.3 11.0*  HCT 39.0 34.7*  PLT 252 235    BMET Recent Labs    06/10/17 0340 06/11/17 0352  NA 144 144  K 4.0 4.3  CL 106 110  CO2 27 24  GLUCOSE 129* 119*  BUN 34* 31*  CREATININE 1.45* 1.34*  CALCIUM 9.4 9.2   PT/INR Recent Labs    06/09/17 0415  LABPROT 12.6  INR 0.96    Recent Labs  Lab 06/07/17 1650 06/09/17 0415 06/11/17 0352  AST 33 19 15  ALT 24 21 15   ALKPHOS 79 65 76  BILITOT 0.7 0.7 0.6  PROT 8.7* 7.4 7.1  ALBUMIN 4.3 3.7 3.1*     Lipase     Component Value Date/Time   LIPASE 52 (H) 06/07/2017 1650     Medications: . cholecalciferol  1,000 Units Oral Daily  . [START ON 06/15/2017] cyanocobalamin  1,000 mcg Intramuscular Q14 Days  . insulin aspart  0-5 Units Subcutaneous QHS  . insulin aspart  0-9 Units Subcutaneous Q4H  . multivitamin with minerals  1 tablet Oral Daily  . pantoprazole (PROTONIX) IV  40 mg Intravenous Q24H   .  ceFAZolin (ANCEF) IV    . dextrose 5 % and 0.9 % NaCl with KCl 20 mEq/L 125 mL/hr at 06/10/17 9169     Assessment/Plan Hypertension Fibromyalgia Anemia GERD  SBO with small bowel in the parastomal hernia sac - admit 06/08/16 Crohn's disease - currently not treated  Partial colectomy,around 2000, total colectomy/ileostomy ~2001 at [facility], Gastric sleeve, ventral hernia, hiatal hernia repair 2016, Dr. Hassell Done  - NPO, less from NG but nothing from the ostomy, Cr better.  Progressive small-bowel distention on AXR.   FEN: IV fluids/NPO ID:  None DVT: heparin Foley:  None Follow up:  TBD  Plan:  OR today.  This has been discussed in detail.  Risks including bleeding, infection, recurrence and damage to adjacent structures discussed.  All questions answered.  Pt agree to proceed.         LOS: 3 days    Krystelle Prashad C. [id number]/50/[id number]

## 2017-06-11 NOTE — Progress Notes (Signed)
Midline honey comb dressing is soiled in middle and bottom. outlined with marker at this time to determine any more blood loss. Pt does have increased pain but incision does not appear to be dehisced.

## 2017-06-11 NOTE — Progress Notes (Signed)
Patient Demographics:    Emily Sanchez, is a 62 y.o. female, DOB - 1956/01/11, OTR:[id number]  Admit date - 06/08/2017   Admitting Physician Ankit Arsenio Loader, MD  Outpatient Primary MD for the patient is Plotnikov, Evie Lacks, MD  LOS - 3  Chief Complaint  Patient presents with  . Emesis  . Abdominal Pain        Subjective:    Emily Sanchez today has no fevers, no emesis,  No chest pain, abd pain persist, eager to have srurgery  Assessment  & Plan :    Active Problems:   SBO (small bowel obstruction) (HCC)  CT AP-  CT of the abdomen pelvis- right lower quadrant ileostomy with small bowel obstruction, transition point apparently stomal hernia, pancreatic ductal prominence, bilateral renal cortical scarring   Brief Summary:- 62 y.o. female with medical history significant of ulcerative colitis/Crohn's disease status post total colectomy in 2001, gastric sleeve in 2016, essential hypertension, GERD admitted on 06/08/17 with N/V/SBO, on 06/11/17 had  EXPLORATORY LAPAROTOMY, LYSIS OF ADHESIONS, REPAIR OF PARASTOMAL HERNIA    Plan:- 1)Small Bowel Obstruction at Parastomal Hernia-  Failed conservative treatment, on 06/11/17 pt underwent expiratory laparotomy, lysis of adhesions, repair of parastomal hernia by Dr Marcello Moores,  PRN opiates and antiemetics,  2)CKD III-creatinine 1.3, urine output is good, c/n to hydrate and avoid nephrotoxic agents  3)HTN-hold HCTZ until patient is euvolemic (NPO for now),  may use IV Hydralazine 10 mg  Every 4 hours Prn for systolic blood pressure over 160 mmhg  Code Status : Full  Disposition Plan  : TBD  Consults  :  Gen surg   DVT Prophylaxis  :   Heparin (hold for surgery), use SCD  Lab Results  Component Value Date   PLT 235 06/11/2017    Inpatient Medications  Scheduled Meds: . cholecalciferol  1,000 Units Oral Daily  . [START ON 06/15/2017] cyanocobalamin   1,000 mcg Intramuscular Q14 Days  . HYDROmorphone      . HYDROmorphone      . insulin aspart  0-5 Units Subcutaneous QHS  . insulin aspart  0-9 Units Subcutaneous Q4H  . multivitamin with minerals  1 tablet Oral Daily  . mupirocin ointment   Nasal BID  . pantoprazole (PROTONIX) IV  40 mg Intravenous Q24H   Continuous Infusions: . dextrose 5 % and 0.9 % NaCl with KCl 20 mEq/L 125 mL/hr at 06/11/17 1512   PRN Meds:.acetaminophen **OR** acetaminophen, alum & mag hydroxide-simeth, guaiFENesin-dextromethorphan, hydrALAZINE, hydrocortisone, hydrocortisone cream, lip balm, loratadine, morphine injection, MUSCLE RUB, ondansetron **OR** ondansetron (ZOFRAN) IV, phenol, polyvinyl alcohol, senna-docusate, sodium chloride, traMADol-acetaminophen    Anti-infectives (From admission, onward)   Start     Dose/Rate Route Frequency Ordered Stop   06/11/17 0800  ceFAZolin (ANCEF) IVPB 2g/100 mL premix     2 g 200 mL/hr over 30 Minutes Intravenous On call to O.R. 06/11/17 0745 06/11/17 1041        Objective:   Vitals:   06/11/17 1245 06/11/17 1300 06/11/17 1315 06/11/17 1330  BP: (!) 161/109 (!) 156/96 (!) 150/93 (!) 150/90  Pulse: (!) 102 (!) 106 (!) 105 95  Resp: 14 14 18 16   Temp:   98.6 F (37 C) 98.6 F (37 C)  TempSrc:      SpO2: 100% 100% 100% 100%  Weight:        Wt Readings from Last 3 Encounters:  06/11/17 103.7 kg (228 lb 11.2 oz)  06/07/17 100.2 kg (221 lb)  02/16/17 101.2 kg (223 lb)     Intake/Output Summary (Last 24 hours) at 06/11/2017 1729 Last data filed at 06/11/2017 1315 Gross per 24 hour  Intake 3993.75 ml  Output 775 ml  Net 3218.75 ml   Physical Exam Gen:- Awake Alert,  In no apparent distress  HEENT:- [place].AT, No sclera icterus Nose- NG tube to intermittent wall suction Neck-Supple Neck,No JVD,.  Lungs-  CTAB  CV- S1, S2 normal Abd-  +ve B.Sounds, Abd Soft, stoma bag empty without fecal content (OR 06/11/17) extremity/Skin:- No  edema,    Psych-affect is  appropriate Neuro-no new focal deficits   Data Review:   Micro Results Recent Results (from the past 240 hour(s))  MRSA PCR Screening     Status: None   Collection Time: 06/11/17  8:29 AM  Result Value Ref Range Status   MRSA by PCR NEGATIVE NEGATIVE Final    Comment:        The GeneXpert MRSA Assay (FDA approved for NASAL specimens only), is one component of a comprehensive MRSA colonization surveillance program. It is not intended to diagnose MRSA infection nor to guide or monitor treatment for MRSA infections. Performed at [facility], [place] [address] [place], [place] [address]     Radiology Reports Dg Abd 1 View  Result Date: 06/11/2017 CLINICAL DATA:  Abdominal pain and distention. EXAM: ABDOMEN - 1 VIEW COMPARISON:  06/10/2017 FINDINGS: The enteric tube tip is in the stomach with side port below GE junction. Within the left abdomen there are persistent dilated loops of small bowel which measure up to 3.7 cm. Not significantly changed from previous exam. Enteric contrast material is again noted within small bowel loops. Diminished gas within the colon. IMPRESSION: 1. Enteric tube tip is within the stomach. 2. Persistent small bowel dilatation with enteric contrast material within small bowel compatible with small bowel obstruction. Electronically Signed   By: Kerby Moors M.D.   On: 06/11/2017 08:37   Dg Abd 1 View  Result Date: 06/10/2017 CLINICAL DATA:  Small-bowel obstruction. EXAM: ABDOMEN - 1 VIEW COMPARISON:  06/09/2017.  06/08/2017.  CT 06/08/2017. FINDINGS: NG tube noted with tip below left hemidiaphragm. Surgical sutures are noted over the left abdomen. Worsening distention of small-bowel loops. Residual enteric contrast noted in mid abdominal and right lower quadrant small-bowel loops. These findings are consistent with persistent small-bowel obstruction. No free air. No acute bony abnormality IMPRESSION: 1.  NG tube noted with its tip over the  stomach. 2. Progressive small-bowel distention. Residual enteric contrast noted in the mid abdominal and right lower quadrant small bowel loops. These findings are consistent with persistent small-bowel obstruction. Electronically Signed   By: Marcello Moores  Register   On: 06/10/2017 07:19   Dg Abdomen 1 View  Result Date: 06/08/2017 CLINICAL DATA:  NG tube placement EXAM: ABDOMEN - 1 VIEW COMPARISON:  CT abdomen pelvis 06/08/2017 FINDINGS: Tip and side port of the nasogastric tube overlie the stomach. No dilated bowel is visible. IMPRESSION: Tip and side port of the nasogastric tube in the stomach. Electronically Signed   By: Ulyses Jarred M.D.   On: 06/08/2017 05:58   Ct Abdomen Pelvis W Contrast  Result Date: 06/08/2017 CLINICAL DATA:  Abdominal pain and distension. Nausea and vomiting. No  output from ostomy. EXAM: CT ABDOMEN AND PELVIS WITH CONTRAST TECHNIQUE: Multidetector CT imaging of the abdomen and pelvis was performed using the standard protocol following bolus administration of intravenous contrast. CONTRAST:  80 cc Isovue-300 IV COMPARISON:  CT 10/29/2015 FINDINGS: Lower chest: Tiny subpleural nodule in the left lower lobe is unchanged from prior exam and considered benign. No consolidation. No pleural fluid. Hepatobiliary: Scattered subcentimeter hepatic hypodensities are too small to be accurately characterize. Unchanged appearance of gallbladder without calcified gallstone or biliary dilatation. Pancreas: Pancreatic ductal prominence measuring 6 mm in the body. No peripancreatic inflammation. No evidence of focal pancreatic mass. Spleen: Normal in size without focal abnormality. Adrenals/Urinary Tract: No adrenal nodule. Right renal scarring related to prior trauma. Prior subcapsular hematoma has resolved. Mild left renal cortical scarring that is chronic. No hydronephrosis. No perinephric edema. Absent delayed excretion on delayed imaging. Urinary bladder is near completely decompressed.  Stomach/Bowel: Post gastric sleeve resection. Stomach is nondistended. Right lower quadrant ileostomy. Increased size of parastomal hernia. Small bowel obstruction within the parastomal hernia with dilated fecalized small bowel contents in the hernia sac. The small bowel in the central abdomen and lower pelvis are also dilated and fluid-filled. There is associated mesenteric edema in the pelvis. No pneumatosis or perforation. Patient is post total colectomy. Vascular/Lymphatic: Mild aortic atherosclerosis. Small retroperitoneal nodes, not enlarged by size criteria. Reproductive: Probable uterine fibroid is unchanged. No adnexal mass. Other: Small amount of free fluid. Mesenteric edema related to of small bowel dilatation. No free air. Small fat containing midline ventral abdominal wall hernia. Musculoskeletal: There are no acute or suspicious osseous abnormalities. IMPRESSION: 1. Right lower quadrant ileostomy with small bowel obstruction, transition point at the parastomal hernia. 2. Pancreatic ductal prominence of 6 mm, likely sequela of prior pancreatic trauma. 3. Bilateral renal cortical scarring. Absent renal excretion on delayed phase imaging suggest underlying renal dysfunction. Electronically Signed   By: Jeb Levering M.D.   On: 06/08/2017 04:14   Dg Abd Portable 1v-small Bowel Obstruction Protocol-initial, 8 Hr Delay  Result Date: 06/09/2017 CLINICAL DATA:  Small bowel obstruction protocol. 8 hour delayed film. EXAM: PORTABLE ABDOMEN - 1 VIEW COMPARISON:  Radiographs and CT yesterday. FINDINGS: Enteric tube in place with tip and side-port below the diaphragm. Administered enteric contrast within prominent central small bowel. No evidence of free air. IMPRESSION: Administered enteric contrast within small bowel in the central abdomen. Recommend 24 hour delayed film. Please note this patient is post total colectomy with right lower quadrant ileostomy. 24 hour film should focus on the right abdomen to  evaluate for ileostomy transit. Electronically Signed   By: Jeb Levering M.D.   On: 06/09/2017 02:34     CBC Recent Labs  Lab 06/07/17 1650 06/09/17 0415 06/10/17 0340 06/11/17 0352  WBC 8.1 6.1 4.7 6.1  HGB 14.6 12.2 12.3 11.0*  HCT 44.1 37.4 39.0 34.7*  PLT 264 240 252 235  MCV 93.4 94.4 96.8 95.1  MCH 30.9 30.8 30.5 30.1  MCHC 33.1 32.6 31.5 31.7  RDW 14.4 14.8 14.9 14.6    Chemistries  Recent Labs  Lab 06/07/17 1650 06/09/17 0415 06/10/17 0340 06/11/17 0352  NA 141 142 144 144  K 3.2* 3.2* 4.0 4.3  CL 102 107 106 110  CO2 25 24 27 24   GLUCOSE 101* 124* 129* 119*  BUN 34* 33* 34* 31*  CREATININE 1.31* 1.36* 1.45* 1.34*  CALCIUM 10.2 9.5 9.4 9.2  MG  --  2.3  --   --  AST 33 19  --  15  ALT 24 21  --  15  ALKPHOS 79 65  --  76  BILITOT 0.7 0.7  --  0.6   ------------------------------------------------------------------------------------------------------------------ No results for input(s): CHOL, HDL, LDLCALC, TRIG, CHOLHDL, LDLDIRECT in the last 72 hours.  Lab Results  Component Value Date   HGBA1C 5.9 12/17/2009   ------------------------------------------------------------------------------------------------------------------ No results for input(s): TSH, T4TOTAL, T3FREE, THYROIDAB in the last 72 hours.  Invalid input(s): FREET3 ------------------------------------------------------------------------------------------------------------------ No results for input(s): VITAMINB12, FOLATE, FERRITIN, TIBC, IRON, RETICCTPCT in the last 72 hours.  Coagulation profile Recent Labs  Lab 06/09/17 0415  INR 0.96    No results for input(s): DDIMER in the last 72 hours.  Cardiac Enzymes No results for input(s): CKMB, TROPONINI, MYOGLOBIN in the last 168 hours.  Invalid input(s): CK ------------------------------------------------------------------------------------------------------------------ No results found for: BNP   Roxan Hockey M.D on  06/11/2017 at 5:29 PM  Between 7am to 7pm - Pager - [phone]  After 7pm go to [email or web address] - password [email or web address]  [facility] -  Office  [phone]   Voice Recognition Viviann Spare dictation system was used to create this note, attempts have been made to correct errors. Please contact the author with questions and/or clarifications.

## 2017-06-11 NOTE — Anesthesia Postprocedure Evaluation (Signed)
Anesthesia Post Note  Patient: Emily Sanchez  Procedure(s) Performed: EXPLORATORY LAPAROTOMY, LYSIS OF ADHESIONS, REPAIR OF PEISTOMAL HERNIA (N/A )     Patient location during evaluation: PACU Anesthesia Type: General Level of consciousness: awake and alert Pain management: pain level controlled Vital Signs Assessment: post-procedure vital signs reviewed and stable Respiratory status: spontaneous breathing, nonlabored ventilation, respiratory function stable and patient connected to nasal cannula oxygen Cardiovascular status: blood pressure returned to baseline and stable Postop Assessment: no apparent nausea or vomiting Anesthetic complications: no    Last Vitals:  Vitals:   06/11/17 1315 06/11/17 1330  BP: (!) 150/93 (!) 150/90  Pulse: (!) 105 95  Resp: 18 16  Temp: 37 C 37 C  SpO2: 100% 100%    Last Pain:  Vitals:   06/11/17 1315  TempSrc:   PainSc: Asleep                 Ellard Nan,W. EDMOND

## 2017-06-11 NOTE — Anesthesia Procedure Notes (Signed)
Procedure Name: Intubation Date/Time: 06/11/2017 10:38 AM Performed by: Nadeem Romanoski D, CRNA Pre-anesthesia Checklist: Patient identified, Emergency Drugs available, Suction available and Patient being monitored Patient Re-evaluated:Patient Re-evaluated prior to induction Oxygen Delivery Method: Circle system utilized Preoxygenation: Pre-oxygenation with 100% oxygen Induction Type: IV induction, Rapid sequence and Cricoid Pressure applied Laryngoscope Size: Mac and 4 Grade View: Grade I Tube type: Oral Tube size: 7.5 mm Number of attempts: 1 Airway Equipment and Method: Stylet Placement Confirmation: ETT inserted through vocal cords under direct vision,  positive ETCO2 and breath sounds checked- equal and bilateral Secured at: 21 cm Tube secured with: Tape Dental Injury: Teeth and Oropharynx as per pre-operative assessment

## 2017-06-11 NOTE — Op Note (Signed)
06/11/2017  12:03 PM  PATIENT:  Emily Sanchez  62 y.o. female  Patient Care Team: Plotnikov, Evie Lacks, MD as PCP - General Sable Feil, MD (Gastroenterology) Paralee Cancel, MD (Orthopedic Surgery)  PRE-OPERATIVE DIAGNOSIS:  SBO, PARASTOMAL HERNIA  POST-OPERATIVE DIAGNOSIS:  SBO, PARASTOMAL HERNIA  PROCEDURE:  EXPLORATORY LAPAROTOMY, LYSIS OF ADHESIONS, REPAIR OF PARASTOMAL HERNIA   Surgeon(s): Leighton Ruff, MD  ASSISTANT: none   ANESTHESIA:   general  EBL: 100 ml Total I/O In: 1000 [I.V.:1000] Out: 200 [Urine:100; Blood:100]  DRAINS: none   SPECIMEN:  No Specimen  DISPOSITION OF SPECIMEN:  N/A  COUNTS:  YES  PLAN OF CARE: inpatient   PATIENT DISPOSITION:  PACU - hemodynamically stable.  INDICATION: 62 y.o. F with small bowel obstruction who presented to the hospital several days ago.  She has a history of multiple abdominal surgeries including total abdominal colectomy, ventral hernia repair and gastric sleeve.  She did not resolve with NG tube decompression.  It was decided to take her to the operating room to relieve this obstruction which appeared to be caused by a parastomal hernia at her ileostomy site.   OR FINDINGS: Parastomal hernia with several loops of small bowel incarcerated.  Moderate abdominal adhesions  DESCRIPTION: the patient was identified in the preoperative holding area and taken to the OR where they were laid supine on the operating room table.  General anesthesia was induced without difficulty. SCDs were also noted to be in place prior to the initiation of anesthesia.  The patient was then prepped and draped in the usual sterile fashion.     A surgical timeout was performed indicating the correct patient, procedure, positioning and need for preoperative antibiotics.   I began by making a midline incision using a double through the previous periumbilical incision.  This was carried down through the subcutaneous tissues using  electrocautery.  The fascia was incised at midline.  Several previous sutures were removed.  I was able to enter the abdomen without difficulty.  There were a few adhesions in the lower midline portion of the wound which were taken down easily with Metzenbaum scissors.  I then palpated the right lower quadrant.  There were several loops of bowel that appeared to be inside a parastomal hernia.  One loop easily reduced.  Identified a band that was holding back the second loop.  I divided this band.  I was then able to carefully remove this from the hernia sac as well.  The band was the obvious source of obstruction.    I inspected the small bowel.  There was no sign of ischemia.  This was then placed back into the abdomen.  I did not perform a full lysis of adhesions as she had many deep pelvic adhesions and lateral sidewall adhesions.  Identified the hernia defect.  I placed 2 #1 Novafil sutures in the defect to tighten this to approximately 1 fingerbreadth width.  The small bowel was then replaced into the abdomen and the fascia was closed using interrupted #1 Novafil sutures.  The subcutaneous tissue was closed in layers using interrupted 2-0 Vicryl sutures.  The skin was closed with a running 4-0 Vicryl suture.  Sterile dressing was applied.  The patient was awakened from anesthesia and sent to the postanesthesia care unit in stable condition.  All counts were correct per operating room staff.

## 2017-06-12 ENCOUNTER — Inpatient Hospital Stay (HOSPITAL_COMMUNITY): Payer: Medicare Other

## 2017-06-12 LAB — GLUCOSE, CAPILLARY
GLUCOSE-CAPILLARY: 114 mg/dL — AB (ref 65–99)
GLUCOSE-CAPILLARY: 129 mg/dL — AB (ref 65–99)
Glucose-Capillary: 120 mg/dL — ABNORMAL HIGH (ref 65–99)
Glucose-Capillary: 128 mg/dL — ABNORMAL HIGH (ref 65–99)
Glucose-Capillary: 133 mg/dL — ABNORMAL HIGH (ref 65–99)
Glucose-Capillary: 94 mg/dL (ref 65–99)
Glucose-Capillary: 95 mg/dL (ref 65–99)

## 2017-06-12 MED ORDER — KETOROLAC TROMETHAMINE 15 MG/ML IJ SOLN
15.0000 mg | Freq: Four times a day (QID) | INTRAMUSCULAR | Status: DC | PRN
  Administered 2017-06-13: 15 mg via INTRAVENOUS
  Filled 2017-06-12: qty 1

## 2017-06-12 MED ORDER — HYDROMORPHONE HCL 1 MG/ML IJ SOLN
1.0000 mg | INTRAMUSCULAR | Status: DC | PRN
  Administered 2017-06-12: 1 mg via INTRAVENOUS
  Administered 2017-06-12: 14:00:00 via INTRAVENOUS
  Administered 2017-06-12 – 2017-06-16 (×16): 1 mg via INTRAVENOUS
  Filled 2017-06-12 (×18): qty 1

## 2017-06-12 MED ORDER — ENOXAPARIN SODIUM 40 MG/0.4ML ~~LOC~~ SOLN
40.0000 mg | SUBCUTANEOUS | Status: DC
  Administered 2017-06-12 – 2017-06-16 (×5): 40 mg via SUBCUTANEOUS
  Filled 2017-06-12 (×5): qty 0.4

## 2017-06-12 NOTE — Progress Notes (Signed)
Patient Demographics:    Emily Sanchez, is a 62 y.o. female, DOB - 1955/08/09, UOH:[id number]  Admit date - 06/08/2017   Admitting Physician Ankit Arsenio Loader, MD  Outpatient Primary MD for the patient is Plotnikov, Evie Lacks, MD  LOS - 4  Chief Complaint  Patient presents with  . Emesis  . Abdominal Pain        Subjective:    Emily Sanchez today has no fevers, no emesis,  No chest pain, husband at bedside, c/o abd pain  Assessment  & Plan :    Active Problems:   SBO (small bowel obstruction) (HCC)  CT AP-  CT of the abdomen pelvis- right lower quadrant ileostomy with small bowel obstruction, transition point apparently stomal hernia, pancreatic ductal prominence, bilateral renal cortical scarring   Brief Summary:- 62 y.o. female with medical history significant of ulcerative colitis/Crohn's disease status post total colectomy in 2001, gastric sleeve in 2016, essential hypertension, GERD admitted on 06/08/17 with N/V/SBO, on 06/11/17 had  EXPLORATORY LAPAROTOMY, LYSIS OF ADHESIONS, REPAIR OF PARASTOMAL HERNIA by Dr Marcello Moores    Plan:- 1)Small Bowel Obstruction at Parastomal Hernia-  Failed conservative treatment, on 06/11/17 pt underwent expiratory laparotomy, lysis of adhesions, repair of parastomal hernia by Dr Marcello Moores, in order to achieve better pain control Dilaudid and Toradol as been ordered , PRN antiemetics,   2)CKD III-creatinine 1.3, urine output is good, c/n to hydrate and avoid nephrotoxic agents  3)HTN-hold HCTZ until patient is euvolemic (NPO for now),  may use IV Hydralazine 10 mg  Every 4 hours Prn for systolic blood pressure over 160 mmhg  4)FEN-n.p.o. except for ice chips, NG tube in place  Code Status : Full  Disposition Plan  : TBD  Consults  :  Gen surg   DVT Prophylaxis  :   Heparin (hold for surgery), use SCD  Lab Results  Component Value Date   PLT 235 06/11/2017     Inpatient Medications  Scheduled Meds: . cholecalciferol  1,000 Units Oral Daily  . [START ON 06/15/2017] cyanocobalamin  1,000 mcg Intramuscular Q14 Days  . enoxaparin (LOVENOX) injection  40 mg Subcutaneous Q24H  . insulin aspart  0-5 Units Subcutaneous QHS  . insulin aspart  0-9 Units Subcutaneous Q4H  . multivitamin with minerals  1 tablet Oral Daily  . mupirocin ointment   Nasal BID  . pantoprazole (PROTONIX) IV  40 mg Intravenous Q24H   Continuous Infusions: . dextrose 5 % and 0.9 % NaCl with KCl 20 mEq/L 125 mL/hr at 06/12/17 0834   PRN Meds:.acetaminophen **OR** acetaminophen, alum & mag hydroxide-simeth, guaiFENesin-dextromethorphan, hydrALAZINE, hydrocortisone, hydrocortisone cream, HYDROmorphone (DILAUDID) injection, ketorolac, lip balm, loratadine, MUSCLE RUB, ondansetron **OR** ondansetron (ZOFRAN) IV, phenol, polyvinyl alcohol, senna-docusate, sodium chloride, traMADol-acetaminophen    Anti-infectives (From admission, onward)   Start     Dose/Rate Route Frequency Ordered Stop   06/11/17 0800  ceFAZolin (ANCEF) IVPB 2g/100 mL premix     2 g 200 mL/hr over 30 Minutes Intravenous On call to O.R. 06/11/17 0745 06/11/17 1041        Objective:   Vitals:   06/11/17 1330 06/11/17 2025 06/12/17 0527 06/12/17 1336  BP: (!) 150/90 131/85 131/90 127/79  Pulse: 95 (!) 119 (!) 106 (!) 104  Resp: 16 16 18 16   Temp: 98.6 F (37 C) 98.4 F (36.9 C) 98.2 F (36.8 C) 98.1 F (36.7 C)  TempSrc:  Oral Oral Oral  SpO2: 100% 100% 96% 97%  Weight:        Wt Readings from Last 3 Encounters:  06/11/17 103.7 kg (228 lb 11.2 oz)  06/07/17 100.2 kg (221 lb)  02/16/17 101.2 kg (223 lb)     Intake/Output Summary (Last 24 hours) at 06/12/2017 1720 Last data filed at 06/12/2017 1336 Gross per 24 hour  Intake -  Output 1000 ml  Net -1000 ml   Physical Exam Gen:- Awake Alert,  In no apparent distress  HEENT:- [place].AT, No sclera icterus Nose- NG tube to intermittent wall  suction Neck-Supple Neck,No JVD,.  Lungs-  CTAB  CV- S1, S2 normal Abd-  +ve B.Sounds, Abd Soft, stoma bag empty without fecal content, post op wound intact, appropriate postop tenderness extremity/Skin:- No  edema,    Psych-affect is appropriate Neuro-no new focal deficits   Data Review:   Micro Results Recent Results (from the past 240 hour(s))  MRSA PCR Screening     Status: None   Collection Time: 06/11/17  8:29 AM  Result Value Ref Range Status   MRSA by PCR NEGATIVE NEGATIVE Final    Comment:        The GeneXpert MRSA Assay (FDA approved for NASAL specimens only), is one component of a comprehensive MRSA colonization surveillance program. It is not intended to diagnose MRSA infection nor to guide or monitor treatment for MRSA infections. Performed at [facility], [place] [address], [place], [place] [address]     Radiology Reports Dg Abd 1 View  Result Date: 06/11/2017 CLINICAL DATA:  Abdominal pain and distention. EXAM: ABDOMEN - 1 VIEW COMPARISON:  06/10/2017 FINDINGS: The enteric tube tip is in the stomach with side port below GE junction. Within the left abdomen there are persistent dilated loops of small bowel which measure up to 3.7 cm. Not significantly changed from previous exam. Enteric contrast material is again noted within small bowel loops. Diminished gas within the colon. IMPRESSION: 1. Enteric tube tip is within the stomach. 2. Persistent small bowel dilatation with enteric contrast material within small bowel compatible with small bowel obstruction. Electronically Signed   By: Kerby Moors M.D.   On: 06/11/2017 08:37   Dg Abd 1 View  Result Date: 06/10/2017 CLINICAL DATA:  Small-bowel obstruction. EXAM: ABDOMEN - 1 VIEW COMPARISON:  06/09/2017.  06/08/2017.  CT 06/08/2017. FINDINGS: NG tube noted with tip below left hemidiaphragm. Surgical sutures are noted over the left abdomen. Worsening distention of small-bowel loops. Residual enteric  contrast noted in mid abdominal and right lower quadrant small-bowel loops. These findings are consistent with persistent small-bowel obstruction. No free air. No acute bony abnormality IMPRESSION: 1.  NG tube noted with its tip over the stomach. 2. Progressive small-bowel distention. Residual enteric contrast noted in the mid abdominal and right lower quadrant small bowel loops. These findings are consistent with persistent small-bowel obstruction. Electronically Signed   By: Marcello Moores  Register   On: 06/10/2017 07:19   Dg Abdomen 1 View  Result Date: 06/08/2017 CLINICAL DATA:  NG tube placement EXAM: ABDOMEN - 1 VIEW COMPARISON:  CT abdomen pelvis 06/08/2017 FINDINGS: Tip and side port of the nasogastric tube overlie the stomach. No dilated bowel is visible. IMPRESSION: Tip and side port of the nasogastric tube in the stomach. Electronically Signed   By: Ulyses Jarred  M.D.   On: 06/08/2017 05:58   Ct Abdomen Pelvis W Contrast  Result Date: 06/08/2017 CLINICAL DATA:  Abdominal pain and distension. Nausea and vomiting. No output from ostomy. EXAM: CT ABDOMEN AND PELVIS WITH CONTRAST TECHNIQUE: Multidetector CT imaging of the abdomen and pelvis was performed using the standard protocol following bolus administration of intravenous contrast. CONTRAST:  80 cc Isovue-300 IV COMPARISON:  CT 10/29/2015 FINDINGS: Lower chest: Tiny subpleural nodule in the left lower lobe is unchanged from prior exam and considered benign. No consolidation. No pleural fluid. Hepatobiliary: Scattered subcentimeter hepatic hypodensities are too small to be accurately characterize. Unchanged appearance of gallbladder without calcified gallstone or biliary dilatation. Pancreas: Pancreatic ductal prominence measuring 6 mm in the body. No peripancreatic inflammation. No evidence of focal pancreatic mass. Spleen: Normal in size without focal abnormality. Adrenals/Urinary Tract: No adrenal nodule. Right renal scarring related to prior trauma.  Prior subcapsular hematoma has resolved. Mild left renal cortical scarring that is chronic. No hydronephrosis. No perinephric edema. Absent delayed excretion on delayed imaging. Urinary bladder is near completely decompressed. Stomach/Bowel: Post gastric sleeve resection. Stomach is nondistended. Right lower quadrant ileostomy. Increased size of parastomal hernia. Small bowel obstruction within the parastomal hernia with dilated fecalized small bowel contents in the hernia sac. The small bowel in the central abdomen and lower pelvis are also dilated and fluid-filled. There is associated mesenteric edema in the pelvis. No pneumatosis or perforation. Patient is post total colectomy. Vascular/Lymphatic: Mild aortic atherosclerosis. Small retroperitoneal nodes, not enlarged by size criteria. Reproductive: Probable uterine fibroid is unchanged. No adnexal mass. Other: Small amount of free fluid. Mesenteric edema related to of small bowel dilatation. No free air. Small fat containing midline ventral abdominal wall hernia. Musculoskeletal: There are no acute or suspicious osseous abnormalities. IMPRESSION: 1. Right lower quadrant ileostomy with small bowel obstruction, transition point at the parastomal hernia. 2. Pancreatic ductal prominence of 6 mm, likely sequela of prior pancreatic trauma. 3. Bilateral renal cortical scarring. Absent renal excretion on delayed phase imaging suggest underlying renal dysfunction. Electronically Signed   By: Jeb Levering M.D.   On: 06/08/2017 04:14   Dg Abd Portable 1v-small Bowel Obstruction Protocol-initial, 8 Hr Delay  Result Date: 06/09/2017 CLINICAL DATA:  Small bowel obstruction protocol. 8 hour delayed film. EXAM: PORTABLE ABDOMEN - 1 VIEW COMPARISON:  Radiographs and CT yesterday. FINDINGS: Enteric tube in place with tip and side-port below the diaphragm. Administered enteric contrast within prominent central small bowel. No evidence of free air. IMPRESSION: Administered  enteric contrast within small bowel in the central abdomen. Recommend 24 hour delayed film. Please note this patient is post total colectomy with right lower quadrant ileostomy. 24 hour film should focus on the right abdomen to evaluate for ileostomy transit. Electronically Signed   By: Jeb Levering M.D.   On: 06/09/2017 02:34     CBC Recent Labs  Lab 06/07/17 1650 06/09/17 0415 06/10/17 0340 06/11/17 0352  WBC 8.1 6.1 4.7 6.1  HGB 14.6 12.2 12.3 11.0*  HCT 44.1 37.4 39.0 34.7*  PLT 264 240 252 235  MCV 93.4 94.4 96.8 95.1  MCH 30.9 30.8 30.5 30.1  MCHC 33.1 32.6 31.5 31.7  RDW 14.4 14.8 14.9 14.6    Chemistries  Recent Labs  Lab 06/07/17 1650 06/09/17 0415 06/10/17 0340 06/11/17 0352  NA 141 142 144 144  K 3.2* 3.2* 4.0 4.3  CL 102 107 106 110  CO2 25 24 27 24   GLUCOSE 101* 124* 129* 119*  BUN  34* 33* 34* 31*  CREATININE 1.31* 1.36* 1.45* 1.34*  CALCIUM 10.2 9.5 9.4 9.2  MG  --  2.3  --   --   AST 33 19  --  15  ALT 24 21  --  15  ALKPHOS 79 65  --  76  BILITOT 0.7 0.7  --  0.6   ------------------------------------------------------------------------------------------------------------------ No results for input(s): CHOL, HDL, LDLCALC, TRIG, CHOLHDL, LDLDIRECT in the last 72 hours.  Lab Results  Component Value Date   HGBA1C 5.9 12/17/2009   ------------------------------------------------------------------------------------------------------------------ No results for input(s): TSH, T4TOTAL, T3FREE, THYROIDAB in the last 72 hours.  Invalid input(s): FREET3 ------------------------------------------------------------------------------------------------------------------ No results for input(s): VITAMINB12, FOLATE, FERRITIN, TIBC, IRON, RETICCTPCT in the last 72 hours.  Coagulation profile Recent Labs  Lab 06/09/17 0415  INR 0.96    No results for input(s): DDIMER in the last 72 hours.  Cardiac Enzymes No results for input(s): CKMB, TROPONINI,  MYOGLOBIN in the last 168 hours.  Invalid input(s): CK ------------------------------------------------------------------------------------------------------------------ No results found for: BNP   Roxan Hockey M.D on 06/12/2017 at 5:20 PM  Between 7am to 7pm - Pager - [phone]  After 7pm go to [email or web address] - password [email or web address]  [facility] -  Office  [phone]   Voice Recognition Viviann Spare dictation system was used to create this note, attempts have been made to correct errors. Please contact the author with questions and/or clarifications.

## 2017-06-12 NOTE — Progress Notes (Signed)
1 Day Post-Op   Subjective/Chief Complaint: WOULD LIKE BETTER PAIN CONTROL SOME OSTOMY OUTPUT    Objective: Vital signs in last 24 hours: Temp:  [98 F (36.7 C)-98.7 F (37.1 C)] 98.2 F (36.8 C) (02/23 0527) Pulse Rate:  [95-119] 106 (02/23 0527) Resp:  [14-21] 18 (02/23 0527) BP: (130-167)/(85-109) 131/90 (02/23 0527) SpO2:  [96 %-100 %] 96 % (02/23 0527) Last BM Date: 06/08/17  Intake/Output from previous day: 02/22 0701 - 02/23 0700 In: 1800 [I.V.:1800] Out: 275 [Urine:175; Blood:100] Intake/Output this shift: No intake/output data recorded.  Incision/Wound:CDI minimal drainage from dressing Ostomy with some stool in bag TTP around incision   Lab Results:  Recent Labs    06/10/17 0340 06/11/17 0352  WBC 4.7 6.1  HGB 12.3 11.0*  HCT 39.0 34.7*  PLT 252 235   BMET Recent Labs    06/10/17 0340 06/11/17 0352  NA 144 144  K 4.0 4.3  CL 106 110  CO2 27 24  GLUCOSE 129* 119*  BUN 34* 31*  CREATININE 1.45* 1.34*  CALCIUM 9.4 9.2   PT/INR No results for input(s): LABPROT, INR in the last 72 hours. ABG No results for input(s): PHART, HCO3 in the last 72 hours.  Invalid input(s): PCO2, PO2  Studies/Results: Dg Abd 1 View  Result Date: 06/11/2017 CLINICAL DATA:  Abdominal pain and distention. EXAM: ABDOMEN - 1 VIEW COMPARISON:  06/10/2017 FINDINGS: The enteric tube tip is in the stomach with side port below GE junction. Within the left abdomen there are persistent dilated loops of small bowel which measure up to 3.7 cm. Not significantly changed from previous exam. Enteric contrast material is again noted within small bowel loops. Diminished gas within the colon. IMPRESSION: 1. Enteric tube tip is within the stomach. 2. Persistent small bowel dilatation with enteric contrast material within small bowel compatible with small bowel obstruction. Electronically Signed   By: Kerby Moors M.D.   On: 06/11/2017 08:37    Anti-infectives: Anti-infectives (From  admission, onward)   Start     Dose/Rate Route Frequency Ordered Stop   06/11/17 0800  ceFAZolin (ANCEF) IVPB 2g/100 mL premix     2 g 200 mL/hr over 30 Minutes Intravenous On call to O.R. 06/11/17 0745 06/11/17 1041      Assessment/Plan: s/p Procedure(s): EXPLORATORY LAPAROTOMY, LYSIS OF ADHESIONS, REPAIR OF PEISTOMAL HERNIA (N/A) Ice chips Change to dilaudid and toradol for pain OOB DVT prevention  Continue NGT   LOS: 4 days    Marcello Moores A Rhonin Trott 06/12/2017

## 2017-06-13 LAB — GLUCOSE, CAPILLARY
GLUCOSE-CAPILLARY: 75 mg/dL (ref 65–99)
GLUCOSE-CAPILLARY: 98 mg/dL (ref 65–99)
GLUCOSE-CAPILLARY: 71 mg/dL (ref 65–99)
GLUCOSE-CAPILLARY: 92 mg/dL (ref 65–99)
Glucose-Capillary: 87 mg/dL (ref 65–99)
Glucose-Capillary: 41 mg/dL — CL (ref 65–99)

## 2017-06-13 LAB — CBC
HCT: 35.5 % — ABNORMAL LOW (ref 36.0–46.0)
Hemoglobin: 11.4 g/dL — ABNORMAL LOW (ref 12.0–15.0)
MCH: 31.1 pg (ref 26.0–34.0)
MCHC: 32.1 g/dL (ref 30.0–36.0)
MCV: 97 fL (ref 78.0–100.0)
PLATELETS: 276 10*3/uL (ref 150–400)
RBC: 3.66 MIL/uL — ABNORMAL LOW (ref 3.87–5.11)
RDW: 14.7 % (ref 11.5–15.5)
WBC: 9.6 10*3/uL (ref 4.0–10.5)

## 2017-06-13 LAB — BASIC METABOLIC PANEL
ANION GAP: 11 (ref 5–15)
BUN: 25 mg/dL — ABNORMAL HIGH (ref 6–20)
CALCIUM: 9.3 mg/dL (ref 8.9–10.3)
CO2: 22 mmol/L (ref 22–32)
CREATININE: 1.23 mg/dL — AB (ref 0.44–1.00)
Chloride: 115 mmol/L — ABNORMAL HIGH (ref 101–111)
GFR calc Af Amer: 54 mL/min — ABNORMAL LOW (ref >60)
GFR, EST NON AFRICAN AMERICAN: 46 mL/min — AB (ref >60)
GLUCOSE: 127 mg/dL — AB (ref 65–99)
Potassium: 4.6 mmol/L (ref 3.5–5.1)
Sodium: 148 mmol/L — ABNORMAL HIGH (ref 135–145)

## 2017-06-13 MED ORDER — DEXTROSE 50 % IV SOLN: 1.0000 | Freq: Once | INTRAVENOUS | Status: AC

## 2017-06-13 MED ORDER — DEXTROSE 50 % IV SOLN
INTRAVENOUS | Status: AC
  Administered 2017-06-13: 50 mL via INTRAVENOUS
  Filled 2017-06-13: qty 50

## 2017-06-13 MED ORDER — DEXTROSE 50 % IV SOLN
1.0000 | Freq: Once | INTRAVENOUS | Status: AC
  Administered 2017-06-13: 50 mL via INTRAVENOUS

## 2017-06-13 NOTE — Progress Notes (Signed)
Patient Demographics:    Emily Sanchez, is a 62 y.o. female, DOB - 1955-05-23, RCB:[id number]  Admit date - 06/08/2017   Admitting Physician Ankit Arsenio Loader, MD  Outpatient Primary MD for the patient is Plotnikov, Evie Lacks, MD  LOS - 5  Chief Complaint  Patient presents with  . Emesis  . Abdominal Pain        Subjective:    Emily Sanchez today has no fevers, no emesis,  No chest pain, poor appetite, refusing to drink much, low sugars noted  Assessment  & Plan :    Active Problems:   SBO (small bowel obstruction) (HCC)  CT AP-  CT of the abdomen pelvis- right lower quadrant ileostomy with small bowel obstruction, transition point apparently stomal hernia, pancreatic ductal prominence, bilateral renal cortical scarring   Brief Summary:- 62 y.o. female with medical history significant of ulcerative colitis/Crohn's disease status post total colectomy in 2001, gastric sleeve in 2016, essential hypertension, GERD admitted on 06/08/17 with N/V/SBO, on 06/11/17 had  EXPLORATORY LAPAROTOMY, LYSIS OF ADHESIONS, REPAIR OF PARASTOMAL HERNIA by Dr Marcello Moores    Plan:- 1)Small Bowel Obstruction at Parastomal Hernia-  abd pain is better, NG tube out on 06/13/17, patient is reluctant to have clear fluids, initially failed conservative treatment, on 06/11/17 pt underwent expiratory laparotomy, lysis of adhesions, repair of parastomal hernia by Dr Marcello Moores,  PRN antiemetics,   2)CKD III-creatinine 1.2, urine output is good, c/n to hydrate and avoid nephrotoxic agents  3)HTN-hold HCTZ until patient is euvolemic (poor oral intake for now),  may use IV Hydralazine 10 mg  Every 4 hours Prn for systolic blood pressure over 160 mmhg  4)FEN-NG tube removed 06/13/2017 okay to try clear liquid diet, recurrent hypoglycemia due to decreased oral intake, continue dextrose solution IV, continue to give as needed D50 boluses.   Encourage increased oral intake to prevent recurrent hypoglycemia  Code Status : Full  Disposition Plan  : TBD  Consults  :  Gen surg   DVT Prophylaxis  :   Lovenox,  use SCD  Lab Results  Component Value Date   PLT 276 06/13/2017    Inpatient Medications  Scheduled Meds: . cholecalciferol  1,000 Units Oral Daily  . [START ON 06/15/2017] cyanocobalamin  1,000 mcg Intramuscular Q14 Days  . dextrose  1 ampule Intravenous Once  . enoxaparin (LOVENOX) injection  40 mg Subcutaneous Q24H  . insulin aspart  0-5 Units Subcutaneous QHS  . insulin aspart  0-9 Units Subcutaneous Q4H  . multivitamin with minerals  1 tablet Oral Daily  . mupirocin ointment   Nasal BID  . pantoprazole (PROTONIX) IV  40 mg Intravenous Q24H   Continuous Infusions: . dextrose 5 % and 0.9 % NaCl with KCl 20 mEq/L 125 mL/hr at 06/13/17 1805   PRN Meds:.acetaminophen **OR** acetaminophen, alum & mag hydroxide-simeth, guaiFENesin-dextromethorphan, hydrALAZINE, hydrocortisone, hydrocortisone cream, HYDROmorphone (DILAUDID) injection, ketorolac, lip balm, loratadine, MUSCLE RUB, ondansetron **OR** ondansetron (ZOFRAN) IV, phenol, polyvinyl alcohol, senna-docusate, sodium chloride, traMADol-acetaminophen    Anti-infectives (From admission, onward)   Start     Dose/Rate Route Frequency Ordered Stop   06/11/17 0800  ceFAZolin (ANCEF) IVPB 2g/100 mL premix     2 g 200 mL/hr over 30 Minutes Intravenous On call  to O.R. 06/11/17 0745 06/11/17 1041        Objective:   Vitals:   06/12/17 1336 06/12/17 2156 06/13/17 0435 06/13/17 1439  BP: 127/79 (!) 150/86 (!) 152/92 140/82  Pulse: (!) 104 (!) 117 (!) 111 (!) 102  Resp: 16 19 18 18   Temp: 98.1 F (36.7 C) 99.2 F (37.3 C) 99.5 F (37.5 C) 98.6 F (37 C)  TempSrc: Oral Oral Oral Oral  SpO2: 97% 96% 95% 100%  Weight:        Wt Readings from Last 3 Encounters:  06/11/17 103.7 kg (228 lb 11.2 oz)  06/07/17 100.2 kg (221 lb)  02/16/17 101.2 kg (223 lb)      Intake/Output Summary (Last 24 hours) at 06/13/2017 1859 Last data filed at 06/13/2017 1200 Gross per 24 hour  Intake 1471.25 ml  Output 2900 ml  Net -1428.75 ml   Physical Exam Gen:- Awake Alert,  In no apparent distress  HEENT:- [place].AT, No sclera icterus Nose- NG tube is now out Neck-Supple Neck,No JVD,.  Lungs-  CTAB  CV- S1, S2 normal Abd-  +ve B.Sounds, Abd Soft, stoma bag empty without fecal content, post op wound intact, appropriate postop tenderness extremity/Skin:- No  edema,    Psych-affect is appropriate Neuro-no new focal deficits, no tremors , gait is steady   Data Review:   Micro Results Recent Results (from the past 240 hour(s))  MRSA PCR Screening     Status: None   Collection Time: 06/11/17  8:29 AM  Result Value Ref Range Status   MRSA by PCR NEGATIVE NEGATIVE Final    Comment:        The GeneXpert MRSA Assay (FDA approved for NASAL specimens only), is one component of a comprehensive MRSA colonization surveillance program. It is not intended to diagnose MRSA infection nor to guide or monitor treatment for MRSA infections. Performed at [facility], [place] [address] [place],  [address]     Radiology Reports Dg Abd 1 View  Result Date: 06/11/2017 CLINICAL DATA:  Abdominal pain and distention. EXAM: ABDOMEN - 1 VIEW COMPARISON:  06/10/2017 FINDINGS: The enteric tube tip is in the stomach with side port below GE junction. Within the left abdomen there are persistent dilated loops of small bowel which measure up to 3.7 cm. Not significantly changed from previous exam. Enteric contrast material is again noted within small bowel loops. Diminished gas within the colon. IMPRESSION: 1. Enteric tube tip is within the stomach. 2. Persistent small bowel dilatation with enteric contrast material within small bowel compatible with small bowel obstruction. Electronically Signed   By: Kerby Moors M.D.   On: 06/11/2017 08:37   Dg Abd 1  View  Result Date: 06/10/2017 CLINICAL DATA:  Small-bowel obstruction. EXAM: ABDOMEN - 1 VIEW COMPARISON:  06/09/2017.  06/08/2017.  CT 06/08/2017. FINDINGS: NG tube noted with tip below left hemidiaphragm. Surgical sutures are noted over the left abdomen. Worsening distention of small-bowel loops. Residual enteric contrast noted in mid abdominal and right lower quadrant small-bowel loops. These findings are consistent with persistent small-bowel obstruction. No free air. No acute bony abnormality IMPRESSION: 1.  NG tube noted with its tip over the stomach. 2. Progressive small-bowel distention. Residual enteric contrast noted in the mid abdominal and right lower quadrant small bowel loops. These findings are consistent with persistent small-bowel obstruction. Electronically Signed   By: Marcello Moores  Register   On: 06/10/2017 07:19   Dg Abdomen 1 View  Result Date: 06/08/2017 CLINICAL DATA:  NG tube placement EXAM: ABDOMEN - 1 VIEW COMPARISON:  CT abdomen pelvis 06/08/2017 FINDINGS: Tip and side port of the nasogastric tube overlie the stomach. No dilated bowel is visible. IMPRESSION: Tip and side port of the nasogastric tube in the stomach. Electronically Signed   By: Ulyses Jarred M.D.   On: 06/08/2017 05:58   Ct Abdomen Pelvis W Contrast  Result Date: 06/08/2017 CLINICAL DATA:  Abdominal pain and distension. Nausea and vomiting. No output from ostomy. EXAM: CT ABDOMEN AND PELVIS WITH CONTRAST TECHNIQUE: Multidetector CT imaging of the abdomen and pelvis was performed using the standard protocol following bolus administration of intravenous contrast. CONTRAST:  80 cc Isovue-300 IV COMPARISON:  CT 10/29/2015 FINDINGS: Lower chest: Tiny subpleural nodule in the left lower lobe is unchanged from prior exam and considered benign. No consolidation. No pleural fluid. Hepatobiliary: Scattered subcentimeter hepatic hypodensities are too small to be accurately characterize. Unchanged appearance of gallbladder without  calcified gallstone or biliary dilatation. Pancreas: Pancreatic ductal prominence measuring 6 mm in the body. No peripancreatic inflammation. No evidence of focal pancreatic mass. Spleen: Normal in size without focal abnormality. Adrenals/Urinary Tract: No adrenal nodule. Right renal scarring related to prior trauma. Prior subcapsular hematoma has resolved. Mild left renal cortical scarring that is chronic. No hydronephrosis. No perinephric edema. Absent delayed excretion on delayed imaging. Urinary bladder is near completely decompressed. Stomach/Bowel: Post gastric sleeve resection. Stomach is nondistended. Right lower quadrant ileostomy. Increased size of parastomal hernia. Small bowel obstruction within the parastomal hernia with dilated fecalized small bowel contents in the hernia sac. The small bowel in the central abdomen and lower pelvis are also dilated and fluid-filled. There is associated mesenteric edema in the pelvis. No pneumatosis or perforation. Patient is post total colectomy. Vascular/Lymphatic: Mild aortic atherosclerosis. Small retroperitoneal nodes, not enlarged by size criteria. Reproductive: Probable uterine fibroid is unchanged. No adnexal mass. Other: Small amount of free fluid. Mesenteric edema related to of small bowel dilatation. No free air. Small fat containing midline ventral abdominal wall hernia. Musculoskeletal: There are no acute or suspicious osseous abnormalities. IMPRESSION: 1. Right lower quadrant ileostomy with small bowel obstruction, transition point at the parastomal hernia. 2. Pancreatic ductal prominence of 6 mm, likely sequela of prior pancreatic trauma. 3. Bilateral renal cortical scarring. Absent renal excretion on delayed phase imaging suggest underlying renal dysfunction. Electronically Signed   By: Jeb Levering M.D.   On: 06/08/2017 04:14   Dg Abd Portable 1v  Result Date: 06/12/2017 CLINICAL DATA:  Assess nasogastric tube placement. EXAM: PORTABLE ABDOMEN -  1 VIEW COMPARISON:  Abdominal radiographs June 11, 2017 FINDINGS: Nasogastric tube tip projects in proximal stomach. Surgical staples in LEFT upper quadrant. Paucity of large bowel gas. Mildly dilated small bowel LEFT upper quadrant, decreased from prior imaging. No intra-abdominal mass effect. Soft tissue planes and included osseous structures are non suspicious. IMPRESSION: Nasogastric tube tip projects in proximal stomach. Improving small bowel obstruction. Electronically Signed   By: Elon Alas M.D.   On: 06/12/2017 22:21   Dg Abd Portable 1v-small Bowel Obstruction Protocol-initial, 8 Hr Delay  Result Date: 06/09/2017 CLINICAL DATA:  Small bowel obstruction protocol. 8 hour delayed film. EXAM: PORTABLE ABDOMEN - 1 VIEW COMPARISON:  Radiographs and CT yesterday. FINDINGS: Enteric tube in place with tip and side-port below the diaphragm. Administered enteric contrast within prominent central small bowel. No evidence of free air. IMPRESSION: Administered enteric contrast within small bowel in the central abdomen. Recommend 24 hour delayed film. Please note this patient  is post total colectomy with right lower quadrant ileostomy. 24 hour film should focus on the right abdomen to evaluate for ileostomy transit. Electronically Signed   By: Jeb Levering M.D.   On: 06/09/2017 02:34     CBC Recent Labs  Lab 06/07/17 1650 06/09/17 0415 06/10/17 0340 06/11/17 0352 06/13/17 0434  WBC 8.1 6.1 4.7 6.1 9.6  HGB 14.6 12.2 12.3 11.0* 11.4*  HCT 44.1 37.4 39.0 34.7* 35.5*  PLT 264 240 252 235 276  MCV 93.4 94.4 96.8 95.1 97.0  MCH 30.9 30.8 30.5 30.1 31.1  MCHC 33.1 32.6 31.5 31.7 32.1  RDW 14.4 14.8 14.9 14.6 14.7    Chemistries  Recent Labs  Lab 06/07/17 1650 06/09/17 0415 06/10/17 0340 06/11/17 0352 06/13/17 0434  NA 141 142 144 144 148*  K 3.2* 3.2* 4.0 4.3 4.6  CL 102 107 106 110 115*  CO2 25 24 27 24 22   GLUCOSE 101* 124* 129* 119* 127*  BUN 34* 33* 34* 31* 25*    CREATININE 1.31* 1.36* 1.45* 1.34* 1.23*  CALCIUM 10.2 9.5 9.4 9.2 9.3  MG  --  2.3  --   --   --   AST 33 19  --  15  --   ALT 24 21  --  15  --   ALKPHOS 79 65  --  76  --   BILITOT 0.7 0.7  --  0.6  --    ------------------------------------------------------------------------------------------------------------------ No results for input(s): CHOL, HDL, LDLCALC, TRIG, CHOLHDL, LDLDIRECT in the last 72 hours.  Lab Results  Component Value Date   HGBA1C 5.9 12/17/2009   ------------------------------------------------------------------------------------------------------------------ No results for input(s): TSH, T4TOTAL, T3FREE, THYROIDAB in the last 72 hours.  Invalid input(s): FREET3 ------------------------------------------------------------------------------------------------------------------ No results for input(s): VITAMINB12, FOLATE, FERRITIN, TIBC, IRON, RETICCTPCT in the last 72 hours.  Coagulation profile Recent Labs  Lab 06/09/17 0415  INR 0.96    No results for input(s): DDIMER in the last 72 hours.  Cardiac Enzymes No results for input(s): CKMB, TROPONINI, MYOGLOBIN in the last 168 hours.  Invalid input(s): CK ------------------------------------------------------------------------------------------------------------------ No results found for: BNP   Roxan Hockey M.D on 06/13/2017 at 6:59 PM  Between 7am to 7pm - Pager - [phone]  After 7pm go to [email or web address] - password [email or web address]  [facility] -  Office  [phone]   Voice Recognition Viviann Spare dictation system was used to create this note, attempts have been made to correct errors. Please contact the author with questions and/or clarifications.

## 2017-06-13 NOTE — Progress Notes (Signed)
2 Days Post-Op   Subjective/Chief Complaint: High ostomy output Pain controlled   Objective: Vital signs in last 24 hours: Temp:  [98.1 F (36.7 C)-99.5 F (37.5 C)] 99.5 F (37.5 C) (02/24 0435) Pulse Rate:  [104-117] 111 (02/24 0435) Resp:  [16-19] 18 (02/24 0435) BP: (127-152)/(79-92) 152/92 (02/24 0435) SpO2:  [95 %-97 %] 95 % (02/24 0435) Last BM Date: 06/12/17  Intake/Output from previous day: 02/23 0701 - 02/24 0700 In: 1231.3 [I.V.:1231.3] Out: 2950 [Urine:1300; Emesis/NG output:850; Stool:800] Intake/Output this shift: No intake/output data recorded.  Incision/Wound:ostomy functioning well Dressing removed  Incision CDI   Lab Results:  Recent Labs    06/11/17 0352 06/13/17 0434  WBC 6.1 9.6  HGB 11.0* 11.4*  HCT 34.7* 35.5*  PLT 235 276   BMET Recent Labs    06/11/17 0352 06/13/17 0434  NA 144 148*  K 4.3 4.6  CL 110 115*  CO2 24 22  GLUCOSE 119* 127*  BUN 31* 25*  CREATININE 1.34* 1.23*  CALCIUM 9.2 9.3   PT/INR No results for input(s): LABPROT, INR in the last 72 hours. ABG No results for input(s): PHART, HCO3 in the last 72 hours.  Invalid input(s): PCO2, PO2  Studies/Results: Dg Abd 1 View  Result Date: 06/11/2017 CLINICAL DATA:  Abdominal pain and distention. EXAM: ABDOMEN - 1 VIEW COMPARISON:  06/10/2017 FINDINGS: The enteric tube tip is in the stomach with side port below GE junction. Within the left abdomen there are persistent dilated loops of small bowel which measure up to 3.7 cm. Not significantly changed from previous exam. Enteric contrast material is again noted within small bowel loops. Diminished gas within the colon. IMPRESSION: 1. Enteric tube tip is within the stomach. 2. Persistent small bowel dilatation with enteric contrast material within small bowel compatible with small bowel obstruction. Electronically Signed   By: Kerby Moors M.D.   On: 06/11/2017 08:37   Dg Abd Portable 1v  Result Date: 06/12/2017 CLINICAL  DATA:  Assess nasogastric tube placement. EXAM: PORTABLE ABDOMEN - 1 VIEW COMPARISON:  Abdominal radiographs June 11, 2017 FINDINGS: Nasogastric tube tip projects in proximal stomach. Surgical staples in LEFT upper quadrant. Paucity of large bowel gas. Mildly dilated small bowel LEFT upper quadrant, decreased from prior imaging. No intra-abdominal mass effect. Soft tissue planes and included osseous structures are non suspicious. IMPRESSION: Nasogastric tube tip projects in proximal stomach. Improving small bowel obstruction. Electronically Signed   By: Elon Alas M.D.   On: 06/12/2017 22:21    Anti-infectives: Anti-infectives (From admission, onward)   Start     Dose/Rate Route Frequency Ordered Stop   06/11/17 0800  ceFAZolin (ANCEF) IVPB 2g/100 mL premix     2 g 200 mL/hr over 30 Minutes Intravenous On call to O.R. 06/11/17 0745 06/11/17 1041      Assessment/Plan: s/p Procedure(s): EXPLORATORY LAPAROTOMY, LYSIS OF ADHESIONS, REPAIR OF PEISTOMAL HERNIA (N/A) D/C NGT Clears  OOB Liquid stool expected  IMPORTANT TO CHANGE BAG FREQUENTLY TO PREVENT OVERFLOW WATCH ELECTROLYTES   LOS: 5 days    Marcello Moores A Emmilyn Crooke 06/13/2017

## 2017-06-13 NOTE — Progress Notes (Signed)
Dr Denton Brick notified of the BS  BEING 41.  Order received  For D50  Order carried out.    [place] Dr Denton Brick  Notified that the B S is 37   Ms Currington has been encouraged to increase PO fluids. She verbalized understanding

## 2017-06-14 LAB — GLUCOSE, CAPILLARY
GLUCOSE-CAPILLARY: 74 mg/dL (ref 65–99)
GLUCOSE-CAPILLARY: 78 mg/dL (ref 65–99)
Glucose-Capillary: 117 mg/dL — ABNORMAL HIGH (ref 65–99)
Glucose-Capillary: 71 mg/dL (ref 65–99)
Glucose-Capillary: 92 mg/dL (ref 65–99)
Glucose-Capillary: 95 mg/dL (ref 65–99)

## 2017-06-14 LAB — BASIC METABOLIC PANEL
ANION GAP: 7 (ref 5–15)
BUN: 16 mg/dL (ref 6–20)
CALCIUM: 9.1 mg/dL (ref 8.9–10.3)
CO2: 20 mmol/L — ABNORMAL LOW (ref 22–32)
Chloride: 114 mmol/L — ABNORMAL HIGH (ref 101–111)
Creatinine, Ser: 1.34 mg/dL — ABNORMAL HIGH (ref 0.44–1.00)
GFR, EST AFRICAN AMERICAN: 48 mL/min — AB (ref >60)
GFR, EST NON AFRICAN AMERICAN: 42 mL/min — AB (ref >60)
GLUCOSE: 107 mg/dL — AB (ref 65–99)
POTASSIUM: 5 mmol/L (ref 3.5–5.1)
Sodium: 141 mmol/L (ref 135–145)

## 2017-06-14 MED ORDER — DEXTROSE-NACL 5-0.9 % IV SOLN
INTRAVENOUS | Status: DC
  Administered 2017-06-14 – 2017-06-15 (×4): via INTRAVENOUS

## 2017-06-14 MED ORDER — INSULIN ASPART 100 UNIT/ML ~~LOC~~ SOLN: 0.0000 [IU] | Freq: Three times a day (TID) | SUBCUTANEOUS | Status: DC

## 2017-06-14 MED ORDER — ACETAMINOPHEN 500 MG PO TABS
1000.0000 mg | ORAL_TABLET | Freq: Three times a day (TID) | ORAL | Status: DC
  Administered 2017-06-14 – 2017-06-16 (×8): 1000 mg via ORAL
  Filled 2017-06-14 (×8): qty 2

## 2017-06-14 MED ORDER — TRAMADOL HCL 50 MG PO TABS
50.0000 mg | ORAL_TABLET | Freq: Four times a day (QID) | ORAL | Status: DC | PRN
  Administered 2017-06-14 – 2017-06-16 (×2): 50 mg via ORAL
  Filled 2017-06-14 (×2): qty 1

## 2017-06-14 NOTE — Progress Notes (Signed)
PROGRESS NOTE    Emily Sanchez  GSU:[id number] DOB: 02-Feb-1956 DOA: 06/08/2017 PCP: Cassandria Anger, MD     Brief Narrative:  Emily Sanchez is a 62 yo female with past medical history of ulcerative colitis/Crohn's disease status post total colectomy in 2001, gastric sleeve in 2016, essential hypertension, GERD admitted on 06/08/17 with N/V. Work up revealed SBO. On 06/11/17, she underwent exlap, lysis of adhesions, repair of parastomal hernia by Dr. Marcello Moores.    Assessment & Plan:   Active Problems:   SBO (small bowel obstruction) (HCC)  SBO with small bowel in parastomal hernia sac -S/p ex lap, lysis of adhesion, repair of parastomal hernia 8/59 by Dr. Leighton Ruff -General surgery following -Advancing to full liquid diet today   AKI on CKD III -Baseline creatinine 1.09 -Continue IVF   HTN -Hold HCTZ for now in setting of AKI    DVT prophylaxis: Lovenox  Code Status: Full Family Communication: At bedside Disposition Plan: Pending improvement   Consultants:   General surgery   Procedures:   Ex lap, lysis of adhesion, repair parastomal hernia 06/11/17  Antimicrobials:  Anti-infectives (From admission, onward)   Start     Dose/Rate Route Frequency Ordered Stop   06/11/17 0800  ceFAZolin (ANCEF) IVPB 2g/100 mL premix     2 g 200 mL/hr over 30 Minutes Intravenous On call to O.R. 06/11/17 0745 06/11/17 1041       Subjective: Feeling well, tolerating clear liquid diet, having stool in ostomy bag. Abdomen remains tender, but improved.   Objective: Vitals:   06/13/17 2004 06/14/17 0404 06/14/17 1305 06/14/17 1403  BP: 128/84 111/68 116/68 136/81  Pulse: (!) 108 96 96 86  Resp: 16 16 16 14   Temp: 98.4 F (36.9 C) 98.4 F (36.9 C) 98.4 F (36.9 C) 98.9 F (37.2 C)  TempSrc: Oral Oral Oral Oral  SpO2: 97% 98%  99%  Weight:   103.4 kg (228 lb)   Height:   5' 5"  (1.651 m)     Intake/Output Summary (Last 24 hours) at 06/14/2017 1521 Last data filed at  06/14/2017 1006 Gross per 24 hour  Intake 3405 ml  Output 650 ml  Net 2755 ml   Filed Weights   06/07/17 2159 06/11/17 0419 06/14/17 1305  Weight: 99.5 kg (219 lb 6.4 oz) 103.7 kg (228 lb 11.2 oz) 103.4 kg (228 lb)    Examination:  General exam: Appears calm and comfortable  Respiratory system: Clear to auscultation. Respiratory effort normal. Cardiovascular system: S1 & S2 heard, RRR. No JVD, murmurs, rubs, gallops or clicks. No pedal edema. Gastrointestinal system: Abdomen is nondistended, soft and tender to palpation. Clean and dry incision. +ostomy with liquid stool. No organomegaly or masses felt. Normal bowel sounds heard. Central nervous system: Alert and oriented. No focal neurological deficits. Extremities: Symmetric 5 x 5 power. Skin: No rashes, lesions or ulcers Psychiatry: Judgement and insight appear normal. Mood & affect appropriate.   Data Reviewed: I have personally reviewed following labs and imaging studies  CBC: Recent Labs  Lab 06/07/17 1650 06/09/17 0415 06/10/17 0340 06/11/17 0352 06/13/17 0434  WBC 8.1 6.1 4.7 6.1 9.6  HGB 14.6 12.2 12.3 11.0* 11.4*  HCT 44.1 37.4 39.0 34.7* 35.5*  MCV 93.4 94.4 96.8 95.1 97.0  PLT 264 240 252 235 292   Basic Metabolic Panel: Recent Labs  Lab 06/09/17 0415 06/10/17 0340 06/11/17 0352 06/13/17 0434 06/14/17 0348  NA 142 144 144 148* 141  K 3.2* 4.0 4.3 4.6 5.0  CL 107 106 110 115* 114*  CO2 24 27 24 22  20*  GLUCOSE 124* 129* 119* 127* 107*  BUN 33* 34* 31* 25* 16  CREATININE 1.36* 1.45* 1.34* 1.23* 1.34*  CALCIUM 9.5 9.4 9.2 9.3 9.1  MG 2.3  --   --   --   --    GFR: Estimated Creatinine Clearance: 52.6 mL/min (A) (by C-G formula based on SCr of 1.34 mg/dL (H)). Liver Function Tests: Recent Labs  Lab 06/07/17 1650 06/09/17 0415 06/11/17 0352  AST 33 19 15  ALT 24 21 15   ALKPHOS 79 65 76  BILITOT 0.7 0.7 0.6  PROT 8.7* 7.4 7.1  ALBUMIN 4.3 3.7 3.1*   Recent Labs  Lab 06/07/17 1650  LIPASE  52*   No results for input(s): AMMONIA in the last 168 hours. Coagulation Profile: Recent Labs  Lab 06/09/17 0415  INR 0.96   Cardiac Enzymes: No results for input(s): CKTOTAL, CKMB, CKMBINDEX, TROPONINI in the last 168 hours. BNP (last 3 results) No results for input(s): PROBNP in the last 8760 hours. HbA1C: No results for input(s): HGBA1C in the last 72 hours. CBG: Recent Labs  Lab 06/13/17 2003 06/14/17 0007 06/14/17 0401 06/14/17 0750 06/14/17 1214  GLUCAP 92 117* 92 74 95   Lipid Profile: No results for input(s): CHOL, HDL, LDLCALC, TRIG, CHOLHDL, LDLDIRECT in the last 72 hours. Thyroid Function Tests: No results for input(s): TSH, T4TOTAL, FREET4, T3FREE, THYROIDAB in the last 72 hours. Anemia Panel: No results for input(s): VITAMINB12, FOLATE, FERRITIN, TIBC, IRON, RETICCTPCT in the last 72 hours. Sepsis Labs: No results for input(s): PROCALCITON, LATICACIDVEN in the last 168 hours.  Recent Results (from the past 240 hour(s))  MRSA PCR Screening     Status: None   Collection Time: 06/11/17  8:29 AM  Result Value Ref Range Status   MRSA by PCR NEGATIVE NEGATIVE Final    Comment:        The GeneXpert MRSA Assay (FDA approved for NASAL specimens only), is one component of a comprehensive MRSA colonization surveillance program. It is not intended to diagnose MRSA infection nor to guide or monitor treatment for MRSA infections. Performed at [facility], [place] [address], [place], [place] [address]        Radiology Studies: Dg Abd Portable 1v  Result Date: 06/12/2017 CLINICAL DATA:  Assess nasogastric tube placement. EXAM: PORTABLE ABDOMEN - 1 VIEW COMPARISON:  Abdominal radiographs June 11, 2017 FINDINGS: Nasogastric tube tip projects in proximal stomach. Surgical staples in LEFT upper quadrant. Paucity of large bowel gas. Mildly dilated small bowel LEFT upper quadrant, decreased from prior imaging. No intra-abdominal mass effect. Soft  tissue planes and included osseous structures are non suspicious. IMPRESSION: Nasogastric tube tip projects in proximal stomach. Improving small bowel obstruction. Electronically Signed   By: Elon Alas M.D.   On: 06/12/2017 22:21      Scheduled Meds: . acetaminophen  1,000 mg Oral Q8H  . cholecalciferol  1,000 Units Oral Daily  . [START ON 06/15/2017] cyanocobalamin  1,000 mcg Intramuscular Q14 Days  . enoxaparin (LOVENOX) injection  40 mg Subcutaneous Q24H  . insulin aspart  0-5 Units Subcutaneous QHS  . insulin aspart  0-9 Units Subcutaneous TID WC  . multivitamin with minerals  1 tablet Oral Daily  . mupirocin ointment   Nasal BID  . pantoprazole (PROTONIX) IV  40 mg Intravenous Q24H   Continuous Infusions: . dextrose 5 % and 0.9% NaCl 125 mL/hr at 06/14/17 [id number]  LOS: 6 days    Time spent: 40 minutes   Dessa Phi, DO [facility] [email or web address] Password [facility] 06/14/2017, 3:21 PM

## 2017-06-14 NOTE — Consult Note (Signed)
[place] Nurse ostomy consult note Stoma type/location: RLQ ileostomy  Independent with care at home.  Has switched over to her brand of products (COnvatec) and we will do a pouch change this morning.  She uses paste and I expressed that I felt a barrier ring would hold up better and she is amenable to trying that today.  Stomal assessment/size: 1 1/8" round pink and producing liquid green effluent Peristomal assessment: intact  Slightly flush stoma.  Will continue her flat system and add a barrier ring.   Treatment options for stomal/peristomal skin: barrier ring Output liquid green stool Ostomy pouching: 2pc.flat convatec pouch  Education provided: Discussed use of barrier ring instead of paste.  Left additional rings at bedside.  Enrolled patient in [place] program: No WOC team will follow.  Domenic Moras RN BSN [place] Pager [phone]

## 2017-06-14 NOTE — Progress Notes (Addendum)
3 Days Post-Op    CC:  SBO/parastomal hernia  Subjective: She looks much better today, midline incision looks good, ileostomy is putting out a good deal.    Objective: Vital signs in last 24 hours: Temp:  [98.4 F (36.9 C)-98.6 F (37 C)] 98.4 F (36.9 C) (02/25 0404) Pulse Rate:  [96-108] 96 (02/25 0404) Resp:  [16-18] 16 (02/25 0404) BP: (111-140)/(68-84) 111/68 (02/25 0404) SpO2:  [97 %-100 %] 98 % (02/25 0404) Last BM Date: 06/13/17 240 PO 2925 IV Urine 200 recorded 1250 from the ostomy recorded Afebrile, VSS still a little tachycardic K+ 5.0 Creatinine is 1.34 No films Intake/Output from previous day: 02/24 0701 - 02/25 0700 In: 3165 [P.O.:240; I.V.:2925] Out: 1600 [Urine:200; Emesis/NG output:150; YHCWC:3762] Intake/Output this shift: No intake/output data recorded.  General appearance: alert, cooperative and no distress Resp: clear to auscultation bilaterally GI: soft sore, site looks fine ileostomy working well with good output.  Lab Results:  Recent Labs    06/13/17 0434  WBC 9.6  HGB 11.4*  HCT 35.5*  PLT 276    BMET Recent Labs    06/13/17 0434 06/14/17 0348  NA 148* 141  K 4.6 5.0  CL 115* 114*  CO2 22 20*  GLUCOSE 127* 107*  BUN 25* 16  CREATININE 1.23* 1.34*  CALCIUM 9.3 9.1   PT/INR No results for input(s): LABPROT, INR in the last 72 hours.  Recent Labs  Lab 06/07/17 1650 06/09/17 0415 06/11/17 0352  AST 33 19 15  ALT 24 21 15   ALKPHOS 79 65 76  BILITOT 0.7 0.7 0.6  PROT 8.7* 7.4 7.1  ALBUMIN 4.3 3.7 3.1*     Lipase     Component Value Date/Time   LIPASE 52 (H) 06/07/2017 1650     Prior to Admission medications   Medication Sig Start Date End Date Taking? Authorizing Provider  acetaminophen (TYLENOL) 500 MG tablet Take 1,000 mg by mouth every 6 (six) hours as needed for mild pain.   Yes [provider]  cholecalciferol (VITAMIN D) 1000 units tablet Take 1,000 Units by mouth daily.   Yes [provider]  cyanocobalamin (,VITAMIN B-12,) 1000 MCG/ML injection INJECT 1 ML (1,000 MCG TOTAL) INTO THE MUSCLE EVERY 14 (FOURTEEN) DAYS. 01/04/17  Yes Plotnikov, Evie Lacks, MD  hydrochlorothiazide (MICROZIDE) 12.5 MG capsule TAKE 1 CAPSULE (12.5 MG TOTAL) BY MOUTH DAILY. 04/23/17  Yes Plotnikov, Evie Lacks, MD  Multiple Vitamin (MULTIVITAMIN WITH MINERALS) TABS tablet Take 1 tablet by mouth daily.   Yes [provider]  pantoprazole (PROTONIX) 40 MG tablet Take 1 tablet (40 mg total) by mouth daily. 10/13/16  Yes Plotnikov, Evie Lacks, MD  traMADol-acetaminophen (ULTRACET) 37.5-325 MG tablet Take 1-2 tablets by mouth every 6 (six) hours as needed for severe pain. 02/16/17  Yes Plotnikov, Evie Lacks, MD  BD INTEGRA SYRINGE 25G X 1" 3 ML MISC TO BE USED FOR B12 INJECTIONS 02/17/16   Plotnikov, Evie Lacks, MD  cyclobenzaprine (FLEXERIL) 5 MG tablet Take 1 tablet (5 mg total) by mouth at bedtime. Patient not taking: Reported on 06/08/2017 12/25/16   Plotnikov, Evie Lacks, MD  ferrous sulfate 325 (65 FE) MG tablet Take 1 tablet (325 mg total) by mouth 2 (two) times daily with a meal. Patient not taking: Reported on 06/08/2017 02/24/16 02/23/17  Plotnikov, Evie Lacks, MD  Milnacipran (SAVELLA) 50 MG TABS tablet Take 1 tablet (50 mg total) by mouth 2 (two) times daily. Patient not taking: Reported on 06/08/2017 10/13/16  Plotnikov, Evie Lacks, MD  SYRINGE-NEEDLE, DISP, 3 ML (B-D INTEGRA SYRINGE) 23G X 1" 3 ML MISC 1 Device by Does not apply route every 30 (thirty) days. For B12 shots 12/10/15   Plotnikov, Evie Lacks, MD    Medications: . cholecalciferol  1,000 Units Oral Daily  . [START ON 06/15/2017] cyanocobalamin  1,000 mcg Intramuscular Q14 Days  . enoxaparin (LOVENOX) injection  40 mg Subcutaneous Q24H  . insulin aspart  0-5 Units Subcutaneous QHS  . insulin aspart  0-9 Units Subcutaneous Q4H  . multivitamin with minerals  1 tablet Oral Daily  . mupirocin ointment   Nasal BID  . pantoprazole  (PROTONIX) IV  40 mg Intravenous Q24H   . dextrose 5 % and 0.9 % NaCl with KCl 20 mEq/L 125 mL/hr at 06/14/17 0230   Anti-infectives (From admission, onward)   Start     Dose/Rate Route Frequency Ordered Stop   06/11/17 0800  ceFAZolin (ANCEF) IVPB 2g/100 mL premix     2 g 200 mL/hr over 30 Minutes Intravenous On call to O.R. 06/11/17 0745 06/11/17 1041     . dextrose 5 % and 0.9 % NaCl with KCl 20 mEq/L 125 mL/hr at 06/14/17 0230    Assessment/Plan Hypertension Fibromyalgia Anemia GERD  SBO with small bowel in the parastomal hernia sac - admit 06/08/16 Exploratory laparotomy, lysis of adhesions, repair of parastomal hernia, 3/81/77, Dr.Alicia Thomas  POD 3 Ileostomy - prior colectomy 2001 Crohn's disease - currently not treated  Partial colectomy,around 2000, total colectomy/ileostomy ~2001 at [facility], Gastric sleeve, ventral hernia, hiatal hernia repair 2016, Dr. Hassell Done   Mild elevation of creatinine/dehydration?  K+ up - take K+ out of IV fluids  FEN: IV fluids/clears ID: Ancef - preop DVT:  Lovenox Foley: None Follow up: Dr. Leighton Ruff  Plan:  Full liquids, take K+ out of the IV, but continue fluids for now I think she is still dry, with elevated creatinine.    She is getting mostly Dilaudid for pain.  She is on Ultracet at home.  I will put her on scheduled Tylenol and add Tramadol.     LOS: 6 days    JENNINGS,WILLARD 06/14/2017 [phone]  Agree with above. Daughter, Hulen Luster, at bedside. Looks good.  Ostomy functioning. Taking small amounts of diet.  Alphonsa Overall, MD, [facility] Surgery Pager: [phone] Office phone:  [phone]

## 2017-06-14 NOTE — Care Management Important Message (Signed)
Important Message  Patient Details  Name: Emily Sanchez MRN: [id number] Date of Birth: 03-04-1956   Medicare Important Message Given:  Yes    Kerin Salen 06/14/2017, 12:11 [place] Message  Patient Details  Name: Emily Sanchez MRN: [id number] Date of Birth: November 12, 1955   Medicare Important Message Given:  Yes    Kerin Salen 06/14/2017, 12:11 PM

## 2017-06-15 LAB — GLUCOSE, CAPILLARY
GLUCOSE-CAPILLARY: 72 mg/dL (ref 65–99)
GLUCOSE-CAPILLARY: 75 mg/dL (ref 65–99)
Glucose-Capillary: 62 mg/dL — ABNORMAL LOW (ref 65–99)
Glucose-Capillary: 64 mg/dL — ABNORMAL LOW (ref 65–99)
Glucose-Capillary: 79 mg/dL (ref 65–99)
Glucose-Capillary: 95 mg/dL (ref 65–99)
Glucose-Capillary: 97 mg/dL (ref 65–99)

## 2017-06-15 LAB — BASIC METABOLIC PANEL
ANION GAP: 7 (ref 5–15)
BUN: 12 mg/dL (ref 6–20)
CALCIUM: 8.8 mg/dL — AB (ref 8.9–10.3)
CO2: 20 mmol/L — AB (ref 22–32)
CREATININE: 1.15 mg/dL — AB (ref 0.44–1.00)
Chloride: 114 mmol/L — ABNORMAL HIGH (ref 101–111)
GFR calc Af Amer: 58 mL/min — ABNORMAL LOW (ref >60)
GFR, EST NON AFRICAN AMERICAN: 50 mL/min — AB (ref >60)
GLUCOSE: 98 mg/dL (ref 65–99)
Potassium: 3.8 mmol/L (ref 3.5–5.1)
Sodium: 141 mmol/L (ref 135–145)

## 2017-06-15 MED ORDER — PANTOPRAZOLE SODIUM 40 MG PO TBEC
40.0000 mg | DELAYED_RELEASE_TABLET | Freq: Every day | ORAL | Status: DC
  Administered 2017-06-15 – 2017-06-16 (×2): 40 mg via ORAL
  Filled 2017-06-15 (×2): qty 1

## 2017-06-15 NOTE — Progress Notes (Addendum)
Hypoglycemic Event  CBG: 62 Treatment: 15 GM carbohydrate snack  Symptoms: None  Follow-up CBG: Time:1731 CBG Result:64  Possible Reasons for Event: Inadequate meal intake  Comments/MD notified:repeat @ [place], Lanny Hurst After supper cbg 97

## 2017-06-15 NOTE — Progress Notes (Signed)
PT Cancellation Note  Patient Details Name: Emily Sanchez MRN: [id number] DOB: [birth year]   Cancelled Treatment:    Reason Eval/Treat Not Completed: Other (comment).  Pt was up walking with husband and declined PT evaluation. Told her PT would ck again tomorrow to see if she is doing well.     Ramond Dial 06/15/2017, 12:29 PM   Mee Hives, PT MS Acute Rehab Dept. Number: [place] and [place]

## 2017-06-15 NOTE — Progress Notes (Signed)
Nutrition Brief Note  Patient identified on the Malnutrition Screening Tool (MST) Report  Weight is now +8 lb. Now on a full liquid diet and tolerating. No needs at this time.  Wt Readings from Last 15 Encounters:  06/15/17 227 lb 1.6 oz (103 kg)  06/07/17 221 lb (100.2 kg)  02/16/17 223 lb (101.2 kg)  10/13/16 216 lb (98 kg)  06/24/16 212 lb 4 oz (96.3 kg)  03/04/16 207 lb (93.9 kg)  12/10/15 206 lb (93.4 kg)  10/07/15 208 lb (94.3 kg)  09/02/15 242 lb 8.1 oz (110 kg)  06/05/15 239 lb (108.4 kg)  05/29/15 240 lb 8 oz (109.1 kg)  04/17/15 250 lb 8 oz (113.6 kg)  04/03/15 274 lb 4 oz (124.4 kg)  03/28/15 268 lb (121.6 kg)  03/11/15 267 lb (121.1 kg)    Body mass index is 37.79 kg/m. Patient meets criteria for obesity based on current BMI.   Current diet order is full liquids, patient is consuming approximately 480 ml of liquids at this time. Labs and medications reviewed.   No nutrition interventions warranted at this time. If nutrition issues arise, please consult RD.   Clayton Bibles, MS, RD, [place] Dietitian Pager: [phone] After Hours Pager: [phone]

## 2017-06-15 NOTE — Progress Notes (Addendum)
4 Days Post-Op    CC: SBO  Subjective: *Doing well with full liquids, sites ok she is sore and not sure about output.  Thinks it's OK.    Objective: Vital signs in last 24 hours: Temp:  [98 F (36.7 C)-98.9 F (37.2 C)] 98 F (36.7 C) (02/26 0445) Pulse Rate:  [86-99] 94 (02/26 0445) Resp:  [12-16] 12 (02/26 0445) BP: (116-137)/(68-92) 121/76 (02/26 0445) SpO2:  [98 %-99 %] 99 % (02/26 0445) Weight:  [103 kg (227 lb 1.6 oz)-103.4 kg (228 lb)] 103 kg (227 lb 1.6 oz) (02/26 0445) Last BM Date: 06/14/17 960 PO recorded 2500 IV 20 from ostomy recorded Afebrile, VSS Creatinine is better, K+ is better Her weight is down some 38.06 KG =>> 37.79 today  Intake/Output from previous day: 02/25 0701 - 02/26 0700 In: 3478.8 [P.O.:960; I.V.:2518.8] Out: 20 [Stool:20] Intake/Output this shift: No intake/output data recorded.  General appearance: alert, cooperative and no distress Resp: clear to auscultation bilaterally GI: soft, sore, incision is fine.  + BS and ostomy output.  Lab Results:  Recent Labs    06/13/17 0434  WBC 9.6  HGB 11.4*  HCT 35.5*  PLT 276    BMET Recent Labs    06/14/17 0348 06/15/17 0405  NA 141 141  K 5.0 3.8  CL 114* 114*  CO2 20* 20*  GLUCOSE 107* 98  BUN 16 12  CREATININE 1.34* 1.15*  CALCIUM 9.1 8.8*   PT/INR No results for input(s): LABPROT, INR in the last 72 hours.  Recent Labs  Lab 06/09/17 0415 06/11/17 0352  AST 19 15  ALT 21 15  ALKPHOS 65 76  BILITOT 0.7 0.6  PROT 7.4 7.1  ALBUMIN 3.7 3.1*     Lipase     Component Value Date/Time   LIPASE 52 (H) 06/07/2017 1650     Medications: . acetaminophen  1,000 mg Oral Q8H  . cholecalciferol  1,000 Units Oral Daily  . cyanocobalamin  1,000 mcg Intramuscular Q14 Days  . enoxaparin (LOVENOX) injection  40 mg Subcutaneous Q24H  . insulin aspart  0-5 Units Subcutaneous QHS  . insulin aspart  0-9 Units Subcutaneous TID WC  . multivitamin with minerals  1 tablet Oral Daily   . mupirocin ointment   Nasal BID  . pantoprazole  40 mg Oral Daily    Assessment/Plan Hypertension Fibromyalgia Anemia GERD  SBO with small bowel in the parastomal hernia sac- admit 06/08/16 Exploratory laparotomy, lysis of adhesions, repair of parastomal hernia, 2/95/62, Dr.Alicia Thomas  POD 3 Ileostomy - prior colectomy 2001 Crohn's disease - currently not treated  Partial colectomy,around 2000, total colectomy/ileostomy ~2001 at [facility], Gastric sleeve, ventral hernia, hiatal hernia repair 2016, Dr. Hassell Done  Mild elevation of creatinine/dehydration?  K+ up - take K+ out of IV fluids  FEN: IV fluids/full liquids ID: Ancef - preop DVT:  Lovenox Foley: None Follow up: Dr. Leighton Ruff  Plan:  Advance diet, decrease IV fluids, she is walking her husband and daughter are with her at home.    If she does well home next 24-48 hours.      LOS: 7 days   JENNINGS,WILLARD 06/15/2017 [phone]  Agree with above. Still sore, but otherwise looks good. She has already walked this AM.  Alphonsa Overall, MD, [facility] Surgery Pager: [phone] Office phone:  [phone]

## 2017-06-15 NOTE — Progress Notes (Addendum)
PROGRESS NOTE    Emily Sanchez  LNL:[id number] DOB: [birth year] DOA: 06/08/2017 PCP: Cassandria Anger, MD     Brief Narrative:  Emily Sanchez is a 62 yo female with past medical history of ulcerative colitis/Crohn's disease status post total colectomy in 2001, gastric sleeve in 2016, essential hypertension, GERD admitted on 06/08/17 with N/V. Work up revealed SBO. On 06/11/17, she underwent exlap, lysis of adhesions, repair of parastomal hernia by Dr. Marcello Moores.    Assessment & Plan:   Active Problems:   Essential hypertension   Crohn's disease ([place])   AKI (acute kidney injury) ([place])   SBO (small bowel obstruction) ([place])  SBO with small bowel in parastomal hernia sac -S/p ex lap, lysis of adhesion, repair of parastomal hernia 4/08 by Dr. Leighton Ruff -General surgery following -Full liquid diet   AKI on CKD III -Baseline creatinine 1.09 -Continue IVF. Improved overnight   HTN -Hold HCTZ for now in setting of AKI    DVT prophylaxis: Lovenox  Code Status: Full Family Communication: No family at bedside Disposition Plan: Pending improvement   Consultants:   General surgery   Procedures:   Ex lap, lysis of adhesion, repair parastomal hernia 06/11/17  Antimicrobials:  Anti-infectives (From admission, onward)   Start     Dose/Rate Route Frequency Ordered Stop   06/11/17 0800  ceFAZolin (ANCEF) IVPB 2g/100 mL premix     2 g 200 mL/hr over 30 Minutes Intravenous On call to O.R. 06/11/17 0745 06/11/17 1041       Subjective: Pain is still present, esp after palpation. She has been walking the hallway and tolerating meals.   Objective: Vitals:   06/14/17 1305 06/14/17 1403 06/14/17 2010 06/15/17 0445  BP: 116/68 136/81 (!) 137/92 121/76  Pulse: 96 86 99 94  Resp: 16 14 12 12   Temp: 98.4 F (36.9 C) 98.9 F (37.2 C) 98.4 F (36.9 C) 98 F (36.7 C)  TempSrc: Oral Oral Oral Oral  SpO2:  99% 98% 99%  Weight: 103.4 kg (228 lb)   103 kg (227 lb 1.6 oz)  Height:  5' 5"  (1.651 m)       Intake/Output Summary (Last 24 hours) at 06/15/2017 1219 Last data filed at 06/15/2017 0600 Gross per 24 hour  Intake 2998.75 ml  Output 20 ml  Net 2978.75 ml   Filed Weights   06/11/17 0419 06/14/17 1305 06/15/17 0445  Weight: 103.7 kg (228 lb 11.2 oz) 103.4 kg (228 lb) 103 kg (227 lb 1.6 oz)    Examination: General exam: Appears calm and comfortable  Respiratory system: Clear to auscultation. Respiratory effort normal. Cardiovascular system: S1 & S2 heard, RRR. No JVD, murmurs, rubs, gallops or clicks. No pedal edema. Gastrointestinal system: Abdomen is nondistended, soft and TTP. No organomegaly or masses felt. Normal bowel sounds heard. +ostomy bag in place  Central nervous system: Alert and oriented. No focal neurological deficits. Extremities: Symmetric 5 x 5 power. Skin: No rashes, lesions or ulcers Psychiatry: Judgement and insight appear normal. Mood & affect appropriate.    Data Reviewed: I have personally reviewed following labs and imaging studies  CBC: Recent Labs  Lab 06/09/17 0415 06/10/17 0340 06/11/17 0352 06/13/17 0434  WBC 6.1 4.7 6.1 9.6  HGB 12.2 12.3 11.0* 11.4*  HCT 37.4 39.0 34.7* 35.5*  MCV 94.4 96.8 95.1 97.0  PLT 240 252 235 144   Basic Metabolic Panel: Recent Labs  Lab 06/09/17 0415 06/10/17 0340 06/11/17 0352 06/13/17 0434 06/14/17 0348 06/15/17 0405  NA 142  144 144 148* 141 141  K 3.2* 4.0 4.3 4.6 5.0 3.8  CL 107 106 110 115* 114* 114*  CO2 24 27 24 22  20* 20*  GLUCOSE 124* 129* 119* 127* 107* 98  BUN 33* 34* 31* 25* 16 12  CREATININE 1.36* 1.45* 1.34* 1.23* 1.34* 1.15*  CALCIUM 9.5 9.4 9.2 9.3 9.1 8.8*  MG 2.3  --   --   --   --   --    GFR: Estimated Creatinine Clearance: 61.1 mL/min (A) (by C-G formula based on SCr of 1.15 mg/dL (H)). Liver Function Tests: Recent Labs  Lab 06/09/17 0415 06/11/17 0352  AST 19 15  ALT 21 15  ALKPHOS 65 76  BILITOT 0.7 0.6  PROT 7.4 7.1  ALBUMIN 3.7 3.1*   No  results for input(s): LIPASE, AMYLASE in the last 168 hours. No results for input(s): AMMONIA in the last 168 hours. Coagulation Profile: Recent Labs  Lab 06/09/17 0415  INR 0.96   Cardiac Enzymes: No results for input(s): CKTOTAL, CKMB, CKMBINDEX, TROPONINI in the last 168 hours. BNP (last 3 results) No results for input(s): PROBNP in the last 8760 hours. HbA1C: No results for input(s): HGBA1C in the last 72 hours. CBG: Recent Labs  Lab 06/14/17 1703 06/14/17 2009 06/15/17 0006 06/15/17 0811 06/15/17 1153  GLUCAP 71 78 95 72 79   Lipid Profile: No results for input(s): CHOL, HDL, LDLCALC, TRIG, CHOLHDL, LDLDIRECT in the last 72 hours. Thyroid Function Tests: No results for input(s): TSH, T4TOTAL, FREET4, T3FREE, THYROIDAB in the last 72 hours. Anemia Panel: No results for input(s): VITAMINB12, FOLATE, FERRITIN, TIBC, IRON, RETICCTPCT in the last 72 hours. Sepsis Labs: No results for input(s): PROCALCITON, LATICACIDVEN in the last 168 hours.  Recent Results (from the past 240 hour(s))  MRSA PCR Screening     Status: None   Collection Time: 06/11/17  8:29 AM  Result Value Ref Range Status   MRSA by PCR NEGATIVE NEGATIVE Final    Comment:        The GeneXpert MRSA Assay (FDA approved for NASAL specimens only), is one component of a comprehensive MRSA colonization surveillance program. It is not intended to diagnose MRSA infection nor to guide or monitor treatment for MRSA infections. Performed at [facility], [place] [address], [place], [place] [address]        Radiology Studies: No results found.    Scheduled Meds: . acetaminophen  1,000 mg Oral Q8H  . cholecalciferol  1,000 Units Oral Daily  . cyanocobalamin  1,000 mcg Intramuscular Q14 Days  . enoxaparin (LOVENOX) injection  40 mg Subcutaneous Q24H  . insulin aspart  0-5 Units Subcutaneous QHS  . insulin aspart  0-9 Units Subcutaneous TID WC  . multivitamin with minerals  1 tablet  Oral Daily  . mupirocin ointment   Nasal BID  . pantoprazole  40 mg Oral Daily   Continuous Infusions: . dextrose 5 % and 0.9% NaCl 50 mL/hr at 06/15/17 1026     LOS: 7 days    Time spent: 30 minutes   Dessa Phi, DO [facility] [email or web address] Password [facility] 06/15/2017, 12:19 PM

## 2017-06-15 NOTE — Discharge Instructions (Signed)
[facility], [place] [phone]  OPEN ABDOMINAL SURGERY: POST OP INSTRUCTIONS  Always review your discharge instruction sheet given to you by the facility where your surgery was performed.  IF YOU HAVE DISABILITY OR FAMILY LEAVE FORMS, YOU MUST BRING THEM TO THE OFFICE FOR PROCESSING.  PLEASE DO NOT GIVE THEM TO YOUR DOCTOR.  1. A prescription for pain medication may be given to you upon discharge.  Take your pain medication as prescribed, if needed.  If narcotic pain medicine is not needed, then you may take acetaminophen (Tylenol) or ibuprofen (Advil) as needed. 2. Take your usually prescribed medications unless otherwise directed. 3. If you need a refill on your pain medication, please contact your pharmacy. They will contact our office to request authorization.  Prescriptions will not be filled after 5pm or on week-ends. 4. You should follow a light diet the first few days after arrival home, such as soup and crackers, pudding, etc.unless your doctor has advised otherwise. A high-fiber, low fat diet can be resumed as tolerated.   Be sure to include lots of fluids daily. Most patients will experience some swelling and bruising on the chest and neck area.  Ice packs will help.  Swelling and bruising can take several days to resolve 5. Most patients will experience some swelling and bruising in the area of the incision. Ice pack will help. Swelling and bruising can take several days to resolve..  6. It is common to experience some constipation if taking pain medication after surgery.  Increasing fluid intake and taking a stool softener will usually help or prevent this problem from occurring.  A mild laxative (Milk of Magnesia or Miralax) should be taken according to package directions if there are no bowel movements after 48 hours. 7.  You may have steri-strips (small skin tapes) in place directly over the incision.  These strips should be left on the skin for 7-10 days.  If your  surgeon used skin glue on the incision, you may shower in 24 hours.  The glue will flake off over the next 2-3 weeks.  Any sutures or staples will be removed at the office during your follow-up visit. You may find that a light gauze bandage over your incision may keep your staples from being rubbed or pulled. You may shower and replace the bandage daily. 8. ACTIVITIES:  You may resume regular (light) daily activities beginning the next day--such as daily self-care, walking, climbing stairs--gradually increasing activities as tolerated.  You may have sexual intercourse when it is comfortable.  Refrain from any heavy lifting or straining until approved by your doctor. a. You may drive when you no longer are taking prescription pain medication, you can comfortably wear a seatbelt, and you can safely maneuver your car and apply brakes b. Return to Work: ___________________________________ 37. You should see your doctor in the office for a follow-up appointment approximately two weeks after your surgery.  Make sure that you call for this appointment within a day or two after you arrive home to insure a convenient appointment time. OTHER INSTRUCTIONS:  _____________________________________________________________ _____________________________________________________________  WHEN TO CALL YOUR DOCTOR: 1. Fever over 101.0 2. Inability to urinate 3. Nausea and/or vomiting 4. Extreme swelling or bruising 5. Continued bleeding from incision. 6. Increased pain, redness, or drainage from the incision. 7. Difficulty swallowing or breathing 8. Muscle cramping or spasms. 9. Numbness or tingling in hands or feet or around lips.  The clinic staff is available to  answer your questions during regular business hours.  Please dont hesitate to call and ask to speak to one of the nurses if you have concerns.  For further questions, please visit [email or web address]    Ileostomy Home Guide An ileostomy  is a surgical procedure to make an opening (stoma) for stool to leave your body. The surgery is done when a medical condition prevents stool from passing through the intestines and leaving your body through the rectum. During the surgery, a part of the small intestine (ileum) is attached to the stoma made in the abdominal wall. A bag or pouch is fitted over the stoma. Stool and gas will collect in the pouch. After having this surgery, you will need to empty and change your ileostomy pouch as needed. You will also need to take steps to care for the stoma. How do I care for my stoma? Your stoma should look pink and moist. At first, the stoma may be swollen, but this swelling will go away within 6 weeks. To care for the stoma:  Keep the skin around the stoma clean and dry.  Use a clean, soft washcloth to gently wash the stoma and the skin around it.  Use stoma powder or ointment on your skin only as told by your health care provider.  If your skin becomes irritated, change your ileostomy pouch. The irritation may indicate that the pouch is leaking.  Measure the stoma opening regularly and record the size. Watch for changes. Share the information with your health care provider.  Check your stoma area every day for signs of infection. Check for: ? More redness, swelling, or pain. ? More fluid or blood. ? Pus or warmth.  How do I care for my ileostomy pouch? The pouch that fits over the stoma can have either one or two pieces.  One-piece pouch: The skin barrier and the pouch are combined in one unit.  Two-piece pouch: The skin barrier and the pouch are separate pieces that attach to each other.  Empty your pouch when it is one-third to one-half full. Do not let more stool or gas build up. This could cause the pouch to leak. Some pouches have a built-in gas release valve. Change your pouch every 3-4 days for the first 6 weeks, and then every 5-7 days. You should also change the pouch right away  if your skin near the stoma looks irritated. How do I empty my ileostomy pouch? You will be taught how to empty your pouch before you leave the hospital. Basic steps include: 1. Wash your hands with soap and water. 2. Sit far back on the toilet. 3. Put pieces of toilet paper into the toilet water. This will prevent splashing as you empty the stool into the toilet bowl. 4. Unclip the tail end of the pouch or separate the hook-and-loop fastener at the end of the pouch. 5. Unroll the tail and empty stool into the toilet. 6. Clean the tail with toilet paper. 7. Reroll the tail, and clip it closed or press the hook-and-loop fastener pieces together. 8. Wash your hands again.  How do I change my ileostomy pouch? You will be taught how to change the pouch before you leave the hospital. Basic steps include: 1. Lay out your supplies. 2. Wash your hands with soap and water. 3. Carefully remove the old pouch. 4. Wash the stoma and the skin around the stoma. Allow the area to dry. Men may be told to carefully shave any hair around the  stoma. 5. Use the stoma measuring guide that comes with your pouch set to decide what size hole you will need to cut in the skin barrier piece. Choose the smallest possible size that will hold the stoma but will not touch it. 6. Use the guide to trace the circle on the back of the skin barrier piece. 7. Cut out the hole. 8. Hold the skin barrier piece over the stoma to make sure the hole is the correct size. 9. Remove the adhesive paper backing from the skin barrier piece. 10. Squeeze stoma paste around the opening of the skin barrier piece. 11. Clean and dry the skin around the stoma again. 12. Carefully fit the skin barrier piece over your stoma. 13. If you are using a two-piece pouch, snap the pouch onto the skin barrier piece. 14. Close the tail of the pouch. 15. Put your hand over the top of the skin barrier piece to help warm it for about 5 minutes. This helps it  conform to your body better. 36. Wash your hands again.  What are some general tips?  Avoid wearing clothes that are tight directly over your stoma.  You can shower with or without the pouch in place. Do not use harsh or oily soaps or lotions. Always keep the pouch on if you are taking a bath or swimming.  If your pouch gets wet, you can dry it with a hair dryer on the cool setting.  Whenever you leave home, take an extra skin barrier and pouch with you.  Store all supplies in a cool, dry place. Do not leave supplies in extreme heat because parts can melt.  To prevent odor, you can put drops of ostomy deodorizer in the pouch. Your health care provider may also recommend putting ostomy lubricant inside the pouch. This helps the stool to slide out of the pouch more easily and completely.  Return to your normal activities as told by your health care provider. Ask your health care provider what activities are safe for you. Contact a health care provider if:  You have more redness, swelling, or pain around your stoma.  You have more fluid or blood coming from your stoma.  Your stoma feels warm to the touch.  You have pus coming from your stoma.  Your stoma extends in or out farther than normal.  Your stoma becomes purple, black, or pale white.  You need to change the pouch every day.  You have a fever.  You have abdominal pain, nausea, or bloating.  No stool is passing from the stoma.  You have diarrhea, requiring you to empty the pouch more frequently than normal. Get help right away if:  Your stool is bloody.  You vomit.  You have trouble breathing.  You feel dizzy or light-headed. This information is not intended to replace advice given to you by your health care provider. Make sure you discuss any questions you have with your health care provider. Document Released: 04/09/2003 Document Revised: 05/03/2015 Document Reviewed: 12/18/2014 Elsevier Interactive Patient  Education  2017 [employer].

## 2017-06-15 NOTE — Progress Notes (Signed)
PT Cancellation Note  Patient Details Name: Emily Sanchez MRN: [id number] DOB: [birth year]   Cancelled Treatment:    Reason Eval/Treat Not Completed: Other (comment).  Pt was in pain and asking for meds, will try later.   Ramond Dial 06/15/2017, 10:44 AM   Mee Hives, PT MS Acute Rehab Dept. Number: [place]

## 2017-06-16 DIAGNOSIS — K56609 Unspecified intestinal obstruction, unspecified as to partial versus complete obstruction: Secondary | ICD-10-CM

## 2017-06-16 LAB — BASIC METABOLIC PANEL
ANION GAP: 7 (ref 5–15)
BUN: 12 mg/dL (ref 6–20)
CO2: 21 mmol/L — AB (ref 22–32)
CREATININE: 1.16 mg/dL — AB (ref 0.44–1.00)
Calcium: 8.8 mg/dL — ABNORMAL LOW (ref 8.9–10.3)
Chloride: 110 mmol/L (ref 101–111)
GFR calc non Af Amer: 50 mL/min — ABNORMAL LOW (ref >60)
GFR, EST AFRICAN AMERICAN: 58 mL/min — AB (ref >60)
Glucose, Bld: 84 mg/dL (ref 65–99)
Potassium: 3.7 mmol/L (ref 3.5–5.1)
SODIUM: 138 mmol/L (ref 135–145)

## 2017-06-16 LAB — CBC
HCT: 29.6 % — ABNORMAL LOW (ref 36.0–46.0)
HEMOGLOBIN: 9.8 g/dL — AB (ref 12.0–15.0)
MCH: 31.3 pg (ref 26.0–34.0)
MCHC: 33.1 g/dL (ref 30.0–36.0)
MCV: 94.6 fL (ref 78.0–100.0)
PLATELETS: 250 10*3/uL (ref 150–400)
RBC: 3.13 MIL/uL — AB (ref 3.87–5.11)
RDW: 14.5 % (ref 11.5–15.5)
WBC: 12.3 10*3/uL — AB (ref 4.0–10.5)

## 2017-06-16 LAB — HEMOGLOBIN AND HEMATOCRIT, BLOOD
HCT: 32.2 % — ABNORMAL LOW (ref 36.0–46.0)
Hemoglobin: 10.4 g/dL — ABNORMAL LOW (ref 12.0–15.0)

## 2017-06-16 LAB — GLUCOSE, CAPILLARY
GLUCOSE-CAPILLARY: 74 mg/dL (ref 65–99)
GLUCOSE-CAPILLARY: 79 mg/dL (ref 65–99)

## 2017-06-16 NOTE — Discharge Summary (Signed)
Physician Discharge Summary  Emily Sanchez DJM:[id number] DOB: [birth year] DOA: 06/08/2017  PCP: Emily Anger, MD  Admit date: 06/08/2017 Discharge date: 06/16/2017  Time spent: over 30 minutes  Recommendations for Outpatient Follow-up:  1. Follow up outpatient CBC/CMP (attention to WBC, H/H, renal function) 2. Consider restarting HCTZ at follow up if renal function stable 3. Follow symptoms to ensure resolution.  4. Ensure follow up with surgery as scheduled 5. Had mild hypoglycemia on day prior to discharge (maybe in setting of poor PO intake that day), but she was not clearly symptomatic.  Continue to follow as indicated as outpatient 6. Incidental finding, pancreatic ductal prominence 6 mm on imaging, follow outpatient as indicated (rad noted possibly related to previous trauma - which she notes she did have abdominal trauma in 2017)   Discharge Diagnoses:  Principal Problem:   SBO (small bowel obstruction) ([place]) Active Problems:   Essential hypertension   Crohn's disease ([place])   AKI (acute kidney injury) ([place])   Discharge Condition: stable  Diet recommendation: heart healthy  Filed Weights   06/11/17 0419 06/14/17 1305 06/15/17 0445  Weight: 103.7 kg (228 lb 11.2 oz) 103.4 kg (228 lb) 103 kg (227 lb 1.6 oz)    History of present illness:  Emily Sanchez is Emily Sanchez 62 yo female with past medical history of ulcerative colitis/Crohn's disease status post total colectomy in 2001, gastric sleeve in 2016, essential hypertension, GERD admitted on 06/08/17 with N/V. Work up revealed SBO. On 06/11/17, she underwent exlap, lysis of adhesions, repair of parastomal hernia by Emily Sanchez.   Hospital Course:  SBO with small bowel in parastomal hernia sac -S/p ex lap, lysis of adhesion, repair of parastomal hernia 2/22 by Emily Sanchez -General surgery following -Full liquid diet  - seen by surgery on day of discharge and felt stable from surgical standpoint, discharge with  outpatient follow up - pt has prescription for ultracet already at home (PMP aware reviewed and also discussed with pt)   AKI on CKD III -Baseline creatinine 1.09 -1.16 at discharge, follow  HTN -Hold HCTZ for now in setting of AKI, resume as outpatient if creatinine stable  Leukocytosis: elevated today on discharge, but no si/sx of infection.  Continue to follow as indicated as an outpatient.  Anemia: stable at discharge, follow as outpatient  Hypoglycemia: mild, occurred on day prior to discharge.  Felt sluggish, but not otherwise clearly symptomatic.  Continue to monitor as outpatient.   Procedures:  Ex lap, lysis of adhesion, repair parastomal hernia 06/11/17  Consultations:  Surgery  Discharge Exam: Vitals:   06/16/17 0547 06/16/17 1326  BP: 118/76 (!) 126/93  Pulse: 92 95  Resp: 20 20  Temp: 98.3 F (36.8 C) 98.6 F (37 C)  SpO2: 99% 99%   Still some abdominal pain, but progressively better.  Feels ok going home today. No fevers, chills, SOB, urinary sx.   General: No acute distress. Cardiovascular: Heart sounds show Emily Sanchez regular rate, and rhythm. No gallops or rubs. No murmurs. No JVD. Lungs: Clear to auscultation bilaterally with good air movement. No rales, rhonchi or wheezes. Abdomen: Soft, mildly tender diffusely, but no rebound or guarding. Midline incision, well healing.. No masses. No hepatosplenomegaly.  Ostomy without stool Neurological: Alert and oriented 3. Moves all extremities 4 . Cranial nerves II through XII grossly intact. Skin: Warm and dry. No rashes or lesions. Extremities: No clubbing or cyanosis. No edema.  Psychiatric: Mood and affect are normal. Insight and judgment are appropirate.  Discharge  Instructions   Discharge Instructions    Call MD for:  difficulty breathing, headache or visual disturbances   Complete by:  As directed    Call MD for:  extreme fatigue   Complete by:  As directed    Call MD for:  persistant dizziness or  light-headedness   Complete by:  As directed    Call MD for:  persistant nausea and vomiting   Complete by:  As directed    Call MD for:  severe uncontrolled pain   Complete by:  As directed    Call MD for:  temperature >100.4   Complete by:  As directed    Diet - low sodium heart healthy   Complete by:  As directed    Discharge instructions   Complete by:  As directed    You were seen for Emily Sanchez small bowel obstruction.  This was corrected after surgery.  You have ultracet for pain.  If you take tylenol in addition to this, do not take more than 3 grams of tylenol Emily Sanchez day combined (the ultracet has 325 mg per tablet).      Please stop your HCTZ (hydrochlorothiazide) until you follow up with your PCP.  Please follow up with Emily Sanchez from surgery as scheduled.    Please follow up with your PCP within Emily Sanchez few days (call to make an appointment at the end of this week if possible for follow up and repeat labs).   Return if you have new, recurrent, or worsening symptoms.  Please have your PCP request records from this hospitalization so they know what was done and what the next steps are.   Increase activity slowly   Complete by:  As directed      Allergies as of 06/16/2017      Reactions   Amphetamine-dextroamphet Er Nausea And Vomiting   Cymbalta [duloxetine Hcl] Other (See Comments)   "Made me sleepy."   Erythromycin Ethylsuccinate Nausea And Vomiting   Morphine Nausea And Vomiting      Medication List    STOP taking these medications   acetaminophen 500 MG tablet Commonly known as:  TYLENOL   hydrochlorothiazide 12.5 MG capsule Commonly known as:  MICROZIDE     TAKE these medications   cholecalciferol 1000 units tablet Commonly known as:  VITAMIN D Take 1,000 Units by mouth daily.   cyanocobalamin 1000 MCG/ML injection Commonly known as:  (VITAMIN B-12) INJECT 1 ML (1,000 MCG TOTAL) INTO THE MUSCLE EVERY 14 (FOURTEEN) DAYS.   cyclobenzaprine 5 MG tablet Commonly known  as:  FLEXERIL Take 1 tablet (5 mg total) by mouth at bedtime.   ferrous sulfate 325 (65 FE) MG tablet Take 1 tablet (325 mg total) by mouth 2 (two) times daily with Emily Sanchez.   Milnacipran 50 MG Tabs tablet Commonly known as:  SAVELLA Take 1 tablet (50 mg total) by mouth 2 (two) times daily.   multivitamin with minerals Tabs tablet Take 1 tablet by mouth daily.   pantoprazole 40 MG tablet Commonly known as:  PROTONIX Take 1 tablet (40 mg total) by mouth daily.   SYRINGE-NEEDLE (DISP) 3 ML 23G X 1" 3 ML Misc Commonly known as:  B-D INTEGRA SYRINGE 1 Device by Does not apply route every 30 (thirty) days. For B12 shots   BD INTEGRA SYRINGE 25G X 1" 3 ML Misc Generic drug:  SYRINGE-NEEDLE (DISP) 3 ML TO BE USED FOR B12 INJECTIONS   traMADol-acetaminophen 37.5-325 MG tablet Commonly known as:  ULTRACET  Take 1-2 tablets by mouth every 6 (six) hours as needed for severe pain.      Allergies  Allergen Reactions  . Amphetamine-Dextroamphet Er Nausea And Vomiting  . Cymbalta [Duloxetine Hcl] Other (See Comments)    "Made me sleepy."  . Erythromycin Ethylsuccinate Nausea And Vomiting  . Morphine Nausea And Vomiting   Follow-up Information    Emily Ruff, MD Follow up on 06/29/2017.   Specialty:  General Surgery Why:  your appointment is at 10:50 AM.  Be at the office 30 minutes early for check in.  Bring photo ID and insurance information.  Contact information: [address] [place] [address] [phone]        Plotnikov, Evie Lacks, MD Follow up.   Specialty:  Internal Medicine Why:  Call and let him know you have been in the hospital and let him go over your medicines and current treatments.   Contact information: [place] [address] [phone]            The results of significant diagnostics from this hospitalization (including imaging, microbiology, ancillary and laboratory) are listed below for reference.    Significant  Diagnostic Studies: Dg Abd 1 View  Result Date: 06/11/2017 CLINICAL DATA:  Abdominal pain and distention. EXAM: ABDOMEN - 1 VIEW COMPARISON:  06/10/2017 FINDINGS: The enteric tube tip is in the stomach with side port below GE junction. Within the left abdomen there are persistent dilated loops of small bowel which measure up to 3.7 cm. Not significantly changed from previous exam. Enteric contrast material is again noted within small bowel loops. Diminished gas within the colon. IMPRESSION: 1. Enteric tube tip is within the stomach. 2. Persistent small bowel dilatation with enteric contrast material within small bowel compatible with small bowel obstruction. Electronically Signed   By: Kerby Moors M.D.   On: 06/11/2017 08:37   Dg Abd 1 View  Result Date: 06/10/2017 CLINICAL DATA:  Small-bowel obstruction. EXAM: ABDOMEN - 1 VIEW COMPARISON:  06/09/2017.  06/08/2017.  CT 06/08/2017. FINDINGS: NG tube noted with tip below left hemidiaphragm. Surgical sutures are noted over the left abdomen. Worsening distention of small-bowel loops. Residual enteric contrast noted in mid abdominal and right lower quadrant small-bowel loops. These findings are consistent with persistent small-bowel obstruction. No free air. No acute bony abnormality IMPRESSION: 1.  NG tube noted with its tip over the stomach. 2. Progressive small-bowel distention. Residual enteric contrast noted in the mid abdominal and right lower quadrant small bowel loops. These findings are consistent with persistent small-bowel obstruction. Electronically Signed   By: Emily Sanchez  Register   On: 06/10/2017 07:19   Dg Abdomen 1 View  Result Date: 06/08/2017 CLINICAL DATA:  NG tube placement EXAM: ABDOMEN - 1 VIEW COMPARISON:  CT abdomen pelvis 06/08/2017 FINDINGS: Tip and side port of the nasogastric tube overlie the stomach. No dilated bowel is visible. IMPRESSION: Tip and side port of the nasogastric tube in the stomach. Electronically Signed   By: Ulyses Jarred M.D.   On: 06/08/2017 05:58   Ct Abdomen Pelvis W Contrast  Result Date: 06/08/2017 CLINICAL DATA:  Abdominal pain and distension. Nausea and vomiting. No output from ostomy. EXAM: CT ABDOMEN AND PELVIS WITH CONTRAST TECHNIQUE: Multidetector CT imaging of the abdomen and pelvis was performed using the standard protocol following bolus administration of intravenous contrast. CONTRAST:  80 cc Isovue-300 IV COMPARISON:  CT 10/29/2015 FINDINGS: Lower chest: Tiny subpleural nodule in the left lower lobe is  unchanged from prior exam and considered benign. No consolidation. No pleural fluid. Hepatobiliary: Scattered subcentimeter hepatic hypodensities are too small to be accurately characterize. Unchanged appearance of gallbladder without calcified gallstone or biliary dilatation. Pancreas: Pancreatic ductal prominence measuring 6 mm in the body. No peripancreatic inflammation. No evidence of focal pancreatic mass. Spleen: Normal in size without focal abnormality. Adrenals/Urinary Tract: No adrenal nodule. Right renal scarring related to prior trauma. Prior subcapsular hematoma has resolved. Mild left renal cortical scarring that is chronic. No hydronephrosis. No perinephric edema. Absent delayed excretion on delayed imaging. Urinary bladder is near completely decompressed. Stomach/Bowel: Post gastric sleeve resection. Stomach is nondistended. Right lower quadrant ileostomy. Increased size of parastomal hernia. Small bowel obstruction within the parastomal hernia with dilated fecalized small bowel contents in the hernia sac. The small bowel in the central abdomen and lower pelvis are also dilated and fluid-filled. There is associated mesenteric edema in the pelvis. No pneumatosis or perforation. Patient is post total colectomy. Vascular/Lymphatic: Mild aortic atherosclerosis. Small retroperitoneal nodes, not enlarged by size criteria. Reproductive: Probable uterine fibroid is unchanged. No adnexal mass. Other:  Small amount of free fluid. Mesenteric edema related to of small bowel dilatation. No free air. Small fat containing midline ventral abdominal wall hernia. Musculoskeletal: There are no acute or suspicious osseous abnormalities. IMPRESSION: 1. Right lower quadrant ileostomy with small bowel obstruction, transition point at the parastomal hernia. 2. Pancreatic ductal prominence of 6 mm, likely sequela of prior pancreatic trauma. 3. Bilateral renal cortical scarring. Absent renal excretion on delayed phase imaging suggest underlying renal dysfunction. Electronically Signed   By: Jeb Levering M.D.   On: 06/08/2017 04:14   Dg Abd Portable 1v  Result Date: 06/12/2017 CLINICAL DATA:  Assess nasogastric tube placement. EXAM: PORTABLE ABDOMEN - 1 VIEW COMPARISON:  Abdominal radiographs June 11, 2017 FINDINGS: Nasogastric tube tip projects in proximal stomach. Surgical staples in LEFT upper quadrant. Paucity of large bowel gas. Mildly dilated small bowel LEFT upper quadrant, decreased from prior imaging. No intra-abdominal mass effect. Soft tissue planes and included osseous structures are non suspicious. IMPRESSION: Nasogastric tube tip projects in proximal stomach. Improving small bowel obstruction. Electronically Signed   By: Elon Alas M.D.   On: 06/12/2017 22:21   Dg Abd Portable 1v-small Bowel Obstruction Protocol-initial, 8 Hr Delay  Result Date: 06/09/2017 CLINICAL DATA:  Small bowel obstruction protocol. 8 hour delayed film. EXAM: PORTABLE ABDOMEN - 1 VIEW COMPARISON:  Radiographs and CT yesterday. FINDINGS: Enteric tube in place with tip and side-port below the diaphragm. Administered enteric contrast within prominent central small bowel. No evidence of free air. IMPRESSION: Administered enteric contrast within small bowel in the central abdomen. Recommend 24 hour delayed film. Please note this patient is post total colectomy with right lower quadrant ileostomy. 24 hour film should focus on  the right abdomen to evaluate for ileostomy transit. Electronically Signed   By: Jeb Levering M.D.   On: 06/09/2017 02:34    Microbiology: Recent Results (from the past 240 hour(s))  MRSA PCR Screening     Status: None   Collection Time: 06/11/17  8:29 AM  Result Value Ref Range Status   MRSA by PCR NEGATIVE NEGATIVE Final    Comment:        The GeneXpert MRSA Assay (FDA approved for NASAL specimens only), is one component of [facility] comprehensive MRSA colonization surveillance program. It is not intended to diagnose MRSA infection nor to guide or monitor treatment for MRSA infections. Performed at [facility], [place] [address], [place], [place] [address]      Labs: Basic Metabolic Panel: Recent Labs  Lab 06/11/17 0352 06/13/17 0434 06/14/17 0348 06/15/17 0405 06/16/17 0338  NA 144 148* 141 141 138  K 4.3 4.6 5.0 3.8 3.7  CL 110 115* 114* 114* 110  CO2 24 22 20* 20* 21*  GLUCOSE 119* 127* 107* 98 84  BUN 31* 25* 16 12 12   CREATININE 1.34* 1.23* 1.34* 1.15* 1.16*  CALCIUM 9.2 9.3 9.1 8.8* 8.8*   Liver Function Tests: Recent Labs  Lab 06/11/17 0352  AST 15  ALT 15  ALKPHOS 76  BILITOT 0.6  PROT 7.1  ALBUMIN 3.1*   No results for input(s): LIPASE, AMYLASE in the last 168 hours. No results for input(s): AMMONIA in the last 168 hours. CBC: Recent Labs  Lab 06/10/17 0340 06/11/17 0352 06/13/17 0434 06/16/17 0338 06/16/17 1246  WBC 4.7 6.1 9.6 12.3*  --   HGB 12.3 11.0* 11.4* 9.8* 10.4*  HCT 39.0 34.7* 35.5* 29.6* 32.2*  MCV 96.8 95.1 97.0 94.6  --   PLT 252 235 276 250  --    Cardiac Enzymes: No results for input(s): CKTOTAL, CKMB, CKMBINDEX, TROPONINI in the last 168 hours. BNP: BNP (last 3 results) No results for input(s): BNP in the last 8760 hours.  ProBNP (last 3 results) No results for input(s): PROBNP in the last 8760 hours.  CBG: Recent Labs  Lab 06/15/17 1731 06/15/17 1830 06/15/17 2131 06/16/17 0800 06/16/17 1129   GLUCAP 64* 97 75 74 79       Signed:  Fayrene Helper MD.  [facility] 06/16/2017, 2:48 PM

## 2017-06-16 NOTE — Progress Notes (Addendum)
5 Days Post-Op    CC: Abdominal pain  Subjective: She reports a little bit of cramping with p.o. intake and with walking.  Her ostomy is working well.  Her abdominal incision looks good.   She had a hypoglycemic last evening.  Objective: Vital signs in last 24 hours: Temp:  [98.2 F (36.8 C)-98.9 F (37.2 C)] 98.3 F (36.8 C) (02/27 0547) Pulse Rate:  [92-96] 92 (02/27 0547) Resp:  [16-20] 20 (02/27 0547) BP: (118-132)/(73-86) 118/76 (02/27 0547) SpO2:  [98 %-100 %] 99 % (02/27 0547) Last BM Date: 06/15/17 720 p.o. 400 urine 760 stool Afebrile vital signs are stable Creatinine stable at 1.16; potassium is 3.7 WBC is up some today at 12.3.  Hemoglobin hematocrit are down slightly    Intake/Output from previous day: 02/26 0701 - 02/27 0700 In: 720 [P.O.:720] Out: 1160 [Urine:400; Stool:760] Intake/Output this shift: No intake/output data recorded.  General appearance: alert, cooperative and no distress Resp: clear to auscultation bilaterally GI: Soft, sore, midline incision is a subcuticular closure and looks good.  Positive bowel sounds, ileostomy is working.  Lab Results:  Recent Labs    06/16/17 0338  WBC 12.3*  HGB 9.8*  HCT 29.6*  PLT 250    BMET Recent Labs    06/15/17 0405 06/16/17 0338  NA 141 138  K 3.8 3.7  CL 114* 110  CO2 20* 21*  GLUCOSE 98 84  BUN 12 12  CREATININE 1.15* 1.16*  CALCIUM 8.8* 8.8*   PT/INR No results for input(s): LABPROT, INR in the last 72 hours.  Recent Labs  Lab 06/11/17 0352  AST 15  ALT 15  ALKPHOS 76  BILITOT 0.6  PROT 7.1  ALBUMIN 3.1*     Lipase     Component Value Date/Time   LIPASE 52 (H) 06/07/2017 1650     Medications: . acetaminophen  1,000 mg Oral Q8H  . cholecalciferol  1,000 Units Oral Daily  . cyanocobalamin  1,000 mcg Intramuscular Q14 Days  . enoxaparin (LOVENOX) injection  40 mg Subcutaneous Q24H  . insulin aspart  0-5 Units Subcutaneous QHS  . insulin aspart  0-9 Units  Subcutaneous TID WC  . multivitamin with minerals  1 tablet Oral Daily  . mupirocin ointment   Nasal BID  . pantoprazole  40 mg Oral Daily    Assessment/Plan  Hypertension Fibromyalgia Anemia GERD  SBO with small bowel in the parastomal hernia sac- admit 06/08/16 Exploratory laparotomy, lysis of adhesions, repair of parastomal hernia, 5/39/76, Dr.Alicia Thomas POD 5 Ileostomy - prior colectomy 2001 Crohn's disease - currently not treated  Partial colectomy,around 2000, total colectomy/ileostomy ~[facility] at [facility], Gastric sleeve, ventral hernia, hiatal hernia repair 2016, Dr. Carlisle Beers ostomy output/soft diet 06/17/15  Mild elevation of creatinine/dehydration?  K+ up - take K+ out of IV fluids  FEN: IV fluids/full liquids ID: Ancef - preop [id number] Foley: None Follow up: Dr. Leighton Ruff  Plan: From a surgical standpoint I think she is probably ready to go.    I am not sure why her WBC is up.  Will defer to Dr. Florene Glen.  She has follow-up information for 06/29/17 with Dr. Marcello Moores.  She also has postop abdominal surgery instructions.    She has a subcuticular closure and can shower whenever she likes.     LOS: 8 days    JENNINGS,WILLARD 06/16/2017 [phone]  Agree with above. She is doing well.  She is on the way out the door with her family.  Alphonsa Overall,  MD, [facility] Pager: [phone] Office phone:  [phone]

## 2017-06-16 NOTE — Progress Notes (Signed)
PT Cancellation Note  Patient Details Name: Denette Hass MRN: [id number] DOB: 04/14/1956   Cancelled Treatment:    Reason Eval/Treat Not Completed: PT screened, no needs identified, will sign off. Patient reports that she is ambulating with family. Has ambulated x 2 today. Please reorder if needs arise.    Claretha Cooper 06/16/2017, 9:50 AM Tresa Endo PT [phone]

## 2017-06-16 NOTE — Progress Notes (Signed)
[name] to be D/C'd Home per MD order. Discussed with the patient and all questions fully answered.    IV catheter discontinued intact. Site without signs and symptoms of complications. Dressing and pressure applied.  An After Visit Summary was printed and given to the patient.  Patient to be escorted via [place], and D/C home via private auto.  Cyndra Numbers  06/16/2017 3:22 PM

## 2017-06-16 NOTE — Consult Note (Signed)
[facility] Nurse ostomy follow up Stoma type/location: RLQ Ileostomy Stomal assessment/size: 1 1/8" slightly oval os, pink, moist, just barely above skin level. Peristomal assessment: intact Treatment options for stomal/peristomal skin: Skin barrier ring and 2 piece Covatec pouch with clamp Output green liquid, 50cc Ostomy pouching: 2pc.Convatec with ring and clamp Education provided: Patient is self sufficient with change, however, the barrier ring used Monday was new to her.  Patient asked to change pouch so she could apply barrier ring.  Pouch removed, and new pouch cut, patient applied barrier ring and pouch without assistance. Pt states she is hopeful of going home today. She will order barrier rings from [employer], two rings given to her to hold her over while she awaits supplies to arrive.  Enrolled patient in [place] Start Discharge program: No WOC team will follow if pt remains in house.  Fara Olden, RN-C, WTA-C, [facility]

## 2017-06-17 ENCOUNTER — Telehealth: Payer: Self-pay | Admitting: *Deleted

## 2017-06-17 NOTE — Telephone Encounter (Signed)
Transition Care Management Follow-up Telephone Call   Date discharged? 06/16/17   How have you been since you were released from the hospital? Pt states she is doing OK   Do you understand why you were in the hospital? YES   Do you understand the discharge instructions? YES   Where were you discharged to? Home   Items Reviewed:  Medications reviewed: YES  Allergies reviewed: YES  Dietary changes reviewed: YES, heart healthy  Referrals reviewed: No referral needed   Functional Questionnaire:   Activities of Daily Living (ADLs):   She states she are independent in the following: ambulation, bathing and hygiene, feeding, continence, grooming, toileting and dressing States she doesn't require assistance    Any transportation issues/concerns?: NO   Any patient concerns? NO   Confirmed importance and date/time of follow-up visits scheduled YES, appt 06/21/17  Provider Appointment booked with Dr. Alain Marion  Confirmed with patient if condition begins to worsen call PCP or go to the ER.  Patient was given the office number and encouraged to call back with question or concerns.  : YES

## 2017-06-21 ENCOUNTER — Other Ambulatory Visit (INDEPENDENT_AMBULATORY_CARE_PROVIDER_SITE_OTHER): Payer: Medicare Other

## 2017-06-21 ENCOUNTER — Encounter: Payer: Self-pay | Admitting: Internal Medicine

## 2017-06-21 ENCOUNTER — Telehealth: Payer: Self-pay

## 2017-06-21 ENCOUNTER — Ambulatory Visit (INDEPENDENT_AMBULATORY_CARE_PROVIDER_SITE_OTHER): Payer: Medicare Other | Admitting: Internal Medicine

## 2017-06-21 DIAGNOSIS — K56609 Unspecified intestinal obstruction, unspecified as to partial versus complete obstruction: Secondary | ICD-10-CM

## 2017-06-21 DIAGNOSIS — N183 Chronic kidney disease, stage 3 unspecified: Secondary | ICD-10-CM

## 2017-06-21 DIAGNOSIS — D638 Anemia in other chronic diseases classified elsewhere: Secondary | ICD-10-CM

## 2017-06-21 DIAGNOSIS — R110 Nausea: Secondary | ICD-10-CM | POA: Diagnosis not present

## 2017-06-21 DIAGNOSIS — I10 Essential (primary) hypertension: Secondary | ICD-10-CM | POA: Diagnosis not present

## 2017-06-21 LAB — CBC WITH DIFFERENTIAL/PLATELET
BASOS ABS: 0.1 10*3/uL (ref 0.0–0.1)
BASOS PCT: 0.4 % (ref 0.0–3.0)
Eosinophils Absolute: 0.1 10*3/uL (ref 0.0–0.7)
Eosinophils Relative: 0.7 % (ref 0.0–5.0)
HEMATOCRIT: 34.8 % — AB (ref 36.0–46.0)
Hemoglobin: 11.6 g/dL — ABNORMAL LOW (ref 12.0–15.0)
LYMPHS PCT: 12.6 % (ref 12.0–46.0)
Lymphs Abs: 2.3 10*3/uL (ref 0.7–4.0)
MCHC: 33.2 g/dL (ref 30.0–36.0)
MCV: 91.4 fl (ref 78.0–100.0)
MONOS PCT: 6.2 % (ref 3.0–12.0)
Monocytes Absolute: 1.1 10*3/uL — ABNORMAL HIGH (ref 0.1–1.0)
NEUTROS ABS: 14.6 10*3/uL — AB (ref 1.4–7.7)
Neutrophils Relative %: 80.1 % — ABNORMAL HIGH (ref 43.0–77.0)
PLATELETS: 431 10*3/uL — AB (ref 150.0–400.0)
RBC: 3.81 Mil/uL — ABNORMAL LOW (ref 3.87–5.11)
RDW: 14 % (ref 11.5–15.5)
WBC: 18.3 10*3/uL (ref 4.0–10.5)

## 2017-06-21 LAB — BASIC METABOLIC PANEL
BUN: 13 mg/dL (ref 6–23)
CHLORIDE: 97 meq/L (ref 96–112)
CO2: 26 meq/L (ref 19–32)
Calcium: 10.1 mg/dL (ref 8.4–10.5)
Creatinine, Ser: 0.97 mg/dL (ref 0.40–1.20)
GFR: 74.9 mL/min (ref >60.00)
Glucose, Bld: 72 mg/dL (ref 70–99)
Potassium: 4.7 mEq/L (ref 3.5–5.1)
SODIUM: 135 meq/L (ref 135–145)

## 2017-06-21 LAB — HEPATIC FUNCTION PANEL
ALBUMIN: 3.3 g/dL — AB (ref 3.5–5.2)
ALK PHOS: 108 U/L (ref 39–117)
ALT: 14 U/L (ref 0–35)
AST: 12 U/L (ref 0–37)
Bilirubin, Direct: 0.2 mg/dL (ref 0.0–0.3)
TOTAL PROTEIN: 8.2 g/dL (ref 6.0–8.3)
Total Bilirubin: 0.4 mg/dL (ref 0.2–1.2)

## 2017-06-21 MED ORDER — ONDANSETRON HCL 4 MG PO TABS: 4.0000 mg | ORAL_TABLET | Freq: Three times a day (TID) | ORAL | 0 refills | Status: DC | PRN

## 2017-06-21 MED ORDER — TRAMADOL-ACETAMINOPHEN 37.5-325 MG PO TABS: 1.0000 | ORAL_TABLET | Freq: Four times a day (QID) | ORAL | 3 refills | Status: DC | PRN

## 2017-06-21 MED ORDER — FLUCONAZOLE 100 MG PO TABS: ORAL_TABLET | ORAL | 1 refills | Status: DC

## 2017-06-21 MED ORDER — CYANOCOBALAMIN 1000 MCG/ML IJ SOLN: 1000.0000 ug | INTRAMUSCULAR | 3 refills | Status: DC

## 2017-06-21 NOTE — Assessment & Plan Note (Signed)
Zofran Diflucan for ?candida

## 2017-06-21 NOTE — Assessment & Plan Note (Signed)
Labs Drink more

## 2017-06-21 NOTE — Assessment & Plan Note (Signed)
BP Readings from Last 3 Encounters:  06/21/17 130/84  06/16/17 (!) 126/93  06/07/17 134/86

## 2017-06-21 NOTE — Assessment & Plan Note (Signed)
S/p  adhesion lysis

## 2017-06-21 NOTE — Assessment & Plan Note (Signed)
CBC

## 2017-06-21 NOTE — Progress Notes (Signed)
Subjective:  Patient ID: Emily Sanchez, female    DOB: 1955-10-13  Age: 62 y.o. MRN: [id number]  CC: No chief complaint on file.   HPI Emily Sanchez presents for her recent post-hosp f/u for SBO, adhesions, s/p adhesions release surgery - chart reviewed and discussed w/pt...  Per hx:  "Hospital Course:  SBO with small bowel in parastomal hernia sac -S/p ex lap, lysis of adhesion, repair of parastomal hernia 5/63 by Dr. Leighton Ruff -General surgery following -Full liquid diet - seen by surgery on day of discharge and felt stable from surgical standpoint, discharge with outpatient follow up - pt has prescription for ultracet already at home (PMP aware reviewed and also discussed with pt)   AKI on CKD III -Baseline creatinine 1.09 -1.16 at discharge, follow  HTN -Hold HCTZ for now in setting of AKI, resume as outpatient if creatinine stable  Leukocytosis: elevated today on discharge, but no si/sx of infection.  Continue to follow as indicated as an outpatient.  Anemia: stable at discharge, follow as outpatient  Hypoglycemia: mild, occurred on day prior to discharge.  Felt sluggish, but not otherwise clearly symptomatic.  Continue to monitor as outpatient.   Procedures:  Ex lap, lysis of adhesion, repair parastomal hernia 06/11/17  Consultations:  Surgery"    Outpatient Medications Prior to Visit  Medication Sig Dispense Refill  . BD INTEGRA SYRINGE 25G X 1" 3 ML MISC TO BE USED FOR B12 INJECTIONS 50 each 3  . cholecalciferol (VITAMIN D) 1000 units tablet Take 1,000 Units by mouth daily.    . cyanocobalamin (,VITAMIN B-12,) 1000 MCG/ML injection INJECT 1 ML (1,000 MCG TOTAL) INTO THE MUSCLE EVERY 14 (FOURTEEN) DAYS. 20 mL 2  . Multiple Vitamin (MULTIVITAMIN WITH MINERALS) TABS tablet Take 1 tablet by mouth daily.    . pantoprazole (PROTONIX) 40 MG tablet Take 1 tablet (40 mg total) by mouth daily. 90 tablet 3  . SYRINGE-NEEDLE, DISP, 3 ML (B-D INTEGRA  SYRINGE) 23G X 1" 3 ML MISC 1 Device by Does not apply route every 30 (thirty) days. For B12 shots 50 each 3  . traMADol-acetaminophen (ULTRACET) 37.5-325 MG tablet Take 1-2 tablets by mouth every 6 (six) hours as needed for severe pain. 120 tablet 3  . cyclobenzaprine (FLEXERIL) 5 MG tablet Take 1 tablet (5 mg total) by mouth at bedtime. (Patient not taking: Reported on 06/08/2017) 30 tablet 2  . ferrous sulfate 325 (65 FE) MG tablet Take 1 tablet (325 mg total) by mouth 2 (two) times daily with a meal. (Patient not taking: Reported on 06/08/2017) 60 tablet 6  . Milnacipran (SAVELLA) 50 MG TABS tablet Take 1 tablet (50 mg total) by mouth 2 (two) times daily. (Patient not taking: Reported on 06/08/2017) 60 tablet 5   No facility-administered medications prior to visit.     ROS Review of Systems  Constitutional: Positive for fatigue. Negative for activity change, appetite change, chills and unexpected weight change.  HENT: Negative for congestion, mouth sores and sinus pressure.   Eyes: Negative for visual disturbance.  Respiratory: Negative for cough and chest tightness.   Gastrointestinal: Positive for nausea. Negative for abdominal pain, blood in stool, diarrhea and vomiting.  Genitourinary: Negative for difficulty urinating, frequency and vaginal pain.  Musculoskeletal: Positive for arthralgias and back pain. Negative for gait problem.  Skin: Negative for pallor and rash.  Neurological: Negative for dizziness, tremors, weakness, numbness and headaches.  Psychiatric/Behavioral: Negative for confusion and sleep disturbance.    Objective:  BP 130/84 (  BP Location: Left Arm, Patient Position: Sitting, Cuff Size: Large)   Pulse 97   Temp 97.7 F (36.5 C) (Oral)   Ht 5' 5"  (1.651 m)   Wt 218 lb (98.9 kg)   SpO2 98%   BMI 36.28 kg/m   BP Readings from Last 3 Encounters:  06/21/17 130/84  06/16/17 (!) 126/93  06/07/17 134/86    Wt Readings from Last 3 Encounters:  06/21/17 218 lb  (98.9 kg)  06/15/17 227 lb 1.6 oz (103 kg)  06/07/17 221 lb (100.2 kg)    Physical Exam  Constitutional: She appears well-developed. No distress.  HENT:  Head: Normocephalic.  Right Ear: External ear normal.  Left Ear: External ear normal.  Nose: Nose normal.  Mouth/Throat: Oropharynx is clear and moist.  Eyes: Conjunctivae are normal. Pupils are equal, round, and reactive to light. Right eye exhibits no discharge. Left eye exhibits no discharge.  Neck: Normal range of motion. Neck supple. No JVD present. No tracheal deviation present. No thyromegaly present.  Cardiovascular: Normal rate, regular rhythm and normal heart sounds.  Pulmonary/Chest: No stridor. No respiratory distress. She has no wheezes.  Abdominal: Soft. Bowel sounds are normal. She exhibits no distension and no mass. There is tenderness. There is no rebound and no guarding.  Musculoskeletal: She exhibits tenderness. She exhibits no edema.  Lymphadenopathy:    She has no cervical adenopathy.  Neurological: She displays normal reflexes. No cranial nerve deficit. She exhibits normal muscle tone. Coordination normal.  Skin: No rash noted. No erythema.  Psychiatric: She has a normal mood and affect. Her behavior is normal. Judgment and thought content normal.  stoma bag ok Looks tired Central abd is tender Scars are healing  ?thrush in mouth/tongue  Lab Results  Component Value Date   WBC 12.3 (H) 06/16/2017   HGB 10.4 (L) 06/16/2017   HCT 32.2 (L) 06/16/2017   PLT 250 06/16/2017   GLUCOSE 84 06/16/2017   CHOL 172 05/27/2010   TRIG 100.0 05/27/2010   HDL 46.30 05/27/2010   LDLCALC 106 (H) 05/27/2010   ALT 15 06/11/2017   AST 15 06/11/2017   NA 138 06/16/2017   K 3.7 06/16/2017   CL 110 06/16/2017   CREATININE 1.16 (H) 06/16/2017   BUN 12 06/16/2017   CO2 21 (L) 06/16/2017   TSH 0.54 02/21/2016   INR 0.96 06/09/2017   HGBA1C 5.9 12/17/2009   MICROALBUR 0.6 02/10/2013    Dg Abdomen 1 View  Result  Date: 06/08/2017 CLINICAL DATA:  NG tube placement EXAM: ABDOMEN - 1 VIEW COMPARISON:  CT abdomen pelvis 06/08/2017 FINDINGS: Tip and side port of the nasogastric tube overlie the stomach. No dilated bowel is visible. IMPRESSION: Tip and side port of the nasogastric tube in the stomach. Electronically Signed   By: Ulyses Jarred M.D.   On: 06/08/2017 05:58   Ct Abdomen Pelvis W Contrast  Result Date: 06/08/2017 CLINICAL DATA:  Abdominal pain and distension. Nausea and vomiting. No output from ostomy. EXAM: CT ABDOMEN AND PELVIS WITH CONTRAST TECHNIQUE: Multidetector CT imaging of the abdomen and pelvis was performed using the standard protocol following bolus administration of intravenous contrast. CONTRAST:  80 cc Isovue-300 IV COMPARISON:  CT 10/29/2015 FINDINGS: Lower chest: Tiny subpleural nodule in the left lower lobe is unchanged from prior exam and considered benign. No consolidation. No pleural fluid. Hepatobiliary: Scattered subcentimeter hepatic hypodensities are too small to be accurately characterize. Unchanged appearance of gallbladder without calcified gallstone or biliary dilatation. Pancreas: Pancreatic ductal prominence  measuring 6 mm in the body. No peripancreatic inflammation. No evidence of focal pancreatic mass. Spleen: Normal in size without focal abnormality. Adrenals/Urinary Tract: No adrenal nodule. Right renal scarring related to prior trauma. Prior subcapsular hematoma has resolved. Mild left renal cortical scarring that is chronic. No hydronephrosis. No perinephric edema. Absent delayed excretion on delayed imaging. Urinary bladder is near completely decompressed. Stomach/Bowel: Post gastric sleeve resection. Stomach is nondistended. Right lower quadrant ileostomy. Increased size of parastomal hernia. Small bowel obstruction within the parastomal hernia with dilated fecalized small bowel contents in the hernia sac. The small bowel in the central abdomen and lower pelvis are also  dilated and fluid-filled. There is associated mesenteric edema in the pelvis. No pneumatosis or perforation. Patient is post total colectomy. Vascular/Lymphatic: Mild aortic atherosclerosis. Small retroperitoneal nodes, not enlarged by size criteria. Reproductive: Probable uterine fibroid is unchanged. No adnexal mass. Other: Small amount of free fluid. Mesenteric edema related to of small bowel dilatation. No free air. Small fat containing midline ventral abdominal wall hernia. Musculoskeletal: There are no acute or suspicious osseous abnormalities. IMPRESSION: 1. Right lower quadrant ileostomy with small bowel obstruction, transition point at the parastomal hernia. 2. Pancreatic ductal prominence of 6 mm, likely sequela of prior pancreatic trauma. 3. Bilateral renal cortical scarring. Absent renal excretion on delayed phase imaging suggest underlying renal dysfunction. Electronically Signed   By: Jeb Levering M.D.   On: 06/08/2017 04:14   Dg Abd Portable 1v-small Bowel Obstruction Protocol-initial, 8 Hr Delay  Result Date: 06/09/2017 CLINICAL DATA:  Small bowel obstruction protocol. 8 hour delayed film. EXAM: PORTABLE ABDOMEN - 1 VIEW COMPARISON:  Radiographs and CT yesterday. FINDINGS: Enteric tube in place with tip and side-port below the diaphragm. Administered enteric contrast within prominent central small bowel. No evidence of free air. IMPRESSION: Administered enteric contrast within small bowel in the central abdomen. Recommend 24 hour delayed film. Please note this patient is post total colectomy with right lower quadrant ileostomy. 24 hour film should focus on the right abdomen to evaluate for ileostomy transit. Electronically Signed   By: Jeb Levering M.D.   On: 06/09/2017 02:34    Assessment & Plan:   There are no diagnoses linked to this encounter. I have discontinued Demmi Goda's ferrous sulfate, Milnacipran, and cyclobenzaprine. I am also having her maintain her multivitamin  with minerals, SYRINGE-NEEDLE (DISP) 3 ML, BD INTEGRA SYRINGE, pantoprazole, cyanocobalamin, traMADol-acetaminophen, and cholecalciferol.  No orders of the defined types were placed in this encounter.    Follow-up: No Follow-up on file.  Walker Kehr, MD

## 2017-06-21 NOTE — Telephone Encounter (Signed)
CRITICAL VALUE STICKER  CRITICAL VALUE: WBC 18.3  RECEIVER (on-site recipient of call): [place] NOTIFIED: [id number] at 1549  MESSENGER (representative from lab): Susan/Lab  MD NOTIFIED: Plotnikov  TIME OF NOTIFICATION:[id number] at 1550  RESPONSE: Pending

## 2017-06-22 ENCOUNTER — Other Ambulatory Visit: Payer: Self-pay

## 2017-06-22 ENCOUNTER — Other Ambulatory Visit: Payer: Self-pay | Admitting: Internal Medicine

## 2017-06-22 ENCOUNTER — Encounter (HOSPITAL_COMMUNITY): Payer: Self-pay | Admitting: Emergency Medicine

## 2017-06-22 ENCOUNTER — Ambulatory Visit (INDEPENDENT_AMBULATORY_CARE_PROVIDER_SITE_OTHER)
Admission: RE | Admit: 2017-06-22 | Discharge: 2017-06-22 | Disposition: A | Discharge status: Home / Self Care | Payer: Medicare Other | Source: Ambulatory Visit | Attending: Internal Medicine | Admitting: Internal Medicine

## 2017-06-22 ENCOUNTER — Inpatient Hospital Stay (HOSPITAL_COMMUNITY)
Admission: EM | Admit: 2017-06-22 | Discharge: 2017-06-26 | DRG: 857 | Disposition: A | Discharge status: Home Health | Payer: Medicare Other | Attending: Family Medicine | Admitting: Family Medicine

## 2017-06-22 DIAGNOSIS — K5010 Crohn's disease of large intestine without complications: Secondary | ICD-10-CM | POA: Diagnosis present

## 2017-06-22 DIAGNOSIS — L02211 Cutaneous abscess of abdominal wall: Secondary | ICD-10-CM | POA: Diagnosis present

## 2017-06-22 DIAGNOSIS — R1084 Generalized abdominal pain: Secondary | ICD-10-CM

## 2017-06-22 DIAGNOSIS — Y838 Other surgical procedures as the cause of abnormal reaction of the patient, or of later complication, without mention of misadventure at the time of the procedure: Secondary | ICD-10-CM

## 2017-06-22 DIAGNOSIS — Z9884 Bariatric surgery status: Secondary | ICD-10-CM | POA: Diagnosis not present

## 2017-06-22 DIAGNOSIS — R000 Tachycardia, unspecified: Secondary | ICD-10-CM | POA: Diagnosis not present

## 2017-06-22 DIAGNOSIS — Z932 Ileostomy status: Secondary | ICD-10-CM | POA: Diagnosis not present

## 2017-06-22 DIAGNOSIS — Z888 Allergy status to other drugs, medicaments and biological substances status: Secondary | ICD-10-CM

## 2017-06-22 DIAGNOSIS — N179 Acute kidney failure, unspecified: Secondary | ICD-10-CM | POA: Diagnosis not present

## 2017-06-22 DIAGNOSIS — Z8249 Family history of ischemic heart disease and other diseases of the circulatory system: Secondary | ICD-10-CM

## 2017-06-22 DIAGNOSIS — I10 Essential (primary) hypertension: Secondary | ICD-10-CM | POA: Diagnosis present

## 2017-06-22 DIAGNOSIS — K5090 Crohn's disease, unspecified, without complications: Secondary | ICD-10-CM | POA: Diagnosis present

## 2017-06-22 DIAGNOSIS — E860 Dehydration: Secondary | ICD-10-CM | POA: Diagnosis present

## 2017-06-22 DIAGNOSIS — K50118 Crohn's disease of large intestine with other complication: Secondary | ICD-10-CM | POA: Diagnosis not present

## 2017-06-22 DIAGNOSIS — T8141XA Infection following a procedure, superficial incisional surgical site, initial encounter: Secondary | ICD-10-CM | POA: Diagnosis not present

## 2017-06-22 DIAGNOSIS — R112 Nausea with vomiting, unspecified: Secondary | ICD-10-CM

## 2017-06-22 DIAGNOSIS — E538 Deficiency of other specified B group vitamins: Secondary | ICD-10-CM | POA: Diagnosis present

## 2017-06-22 DIAGNOSIS — K219 Gastro-esophageal reflux disease without esophagitis: Secondary | ICD-10-CM | POA: Diagnosis present

## 2017-06-22 DIAGNOSIS — Z9049 Acquired absence of other specified parts of digestive tract: Secondary | ICD-10-CM

## 2017-06-22 DIAGNOSIS — L03311 Cellulitis of abdominal wall: Secondary | ICD-10-CM | POA: Diagnosis present

## 2017-06-22 DIAGNOSIS — Z885 Allergy status to narcotic agent status: Secondary | ICD-10-CM | POA: Diagnosis not present

## 2017-06-22 DIAGNOSIS — Z881 Allergy status to other antibiotic agents status: Secondary | ICD-10-CM | POA: Diagnosis not present

## 2017-06-22 DIAGNOSIS — M170 Bilateral primary osteoarthritis of knee: Secondary | ICD-10-CM | POA: Diagnosis present

## 2017-06-22 DIAGNOSIS — T8149XA Infection following a procedure, other surgical site, initial encounter: Secondary | ICD-10-CM | POA: Diagnosis not present

## 2017-06-22 DIAGNOSIS — Z87891 Personal history of nicotine dependence: Secondary | ICD-10-CM | POA: Diagnosis not present

## 2017-06-22 DIAGNOSIS — A280 Pasteurellosis: Secondary | ICD-10-CM | POA: Diagnosis present

## 2017-06-22 DIAGNOSIS — R140 Abdominal distension (gaseous): Secondary | ICD-10-CM

## 2017-06-22 LAB — CBC WITH DIFFERENTIAL/PLATELET
Basophils Absolute: 0 10*3/uL (ref 0.0–0.1)
Basophils Relative: 0 %
Eosinophils Absolute: 0 10*3/uL (ref 0.0–0.7)
Eosinophils Relative: 0 %
HEMATOCRIT: 33.8 % — AB (ref 36.0–46.0)
HEMOGLOBIN: 11.3 g/dL — AB (ref 12.0–15.0)
LYMPHS ABS: 2 10*3/uL (ref 0.7–4.0)
Lymphocytes Relative: 9 %
MCH: 30.9 pg (ref 26.0–34.0)
MCHC: 33.4 g/dL (ref 30.0–36.0)
MCV: 92.3 fL (ref 78.0–100.0)
MONOS PCT: 6 %
Monocytes Absolute: 1.2 10*3/uL — ABNORMAL HIGH (ref 0.1–1.0)
NEUTROS ABS: 17.9 10*3/uL — AB (ref 1.7–7.7)
NEUTROS PCT: 85 %
PLATELETS: 473 10*3/uL — AB (ref 150–400)
RBC: 3.66 MIL/uL — ABNORMAL LOW (ref 3.87–5.11)
RDW: 14.1 % (ref 11.5–15.5)
WBC: 21.1 10*3/uL — ABNORMAL HIGH (ref 4.0–10.5)

## 2017-06-22 LAB — URINALYSIS, ROUTINE W REFLEX MICROSCOPIC
Bacteria, UA: NONE SEEN
Bilirubin Urine: NEGATIVE
GLUCOSE, UA: NEGATIVE mg/dL
KETONES UR: 80 mg/dL — AB
LEUKOCYTES UA: NEGATIVE
Nitrite: NEGATIVE
Protein, ur: 100 mg/dL — AB
Specific Gravity, Urine: >1.046 — ABNORMAL HIGH (ref 1.005–1.030)
pH: 5 (ref 5.0–8.0)

## 2017-06-22 LAB — COMPREHENSIVE METABOLIC PANEL
ALBUMIN: 3 g/dL — AB (ref 3.5–5.0)
ALT: 17 U/L (ref 14–54)
AST: 18 U/L (ref 15–41)
Alkaline Phosphatase: 115 U/L (ref 38–126)
Anion gap: 17 — ABNORMAL HIGH (ref 5–15)
BUN: 11 mg/dL (ref 6–20)
CO2: 20 mmol/L — AB (ref 22–32)
CREATININE: 1.15 mg/dL — AB (ref 0.44–1.00)
Calcium: 9.3 mg/dL (ref 8.9–10.3)
Chloride: 97 mmol/L — ABNORMAL LOW (ref 101–111)
GFR calc Af Amer: 58 mL/min — ABNORMAL LOW (ref >60)
GFR calc non Af Amer: 50 mL/min — ABNORMAL LOW (ref >60)
Glucose, Bld: 73 mg/dL (ref 65–99)
POTASSIUM: 3.8 mmol/L (ref 3.5–5.1)
SODIUM: 134 mmol/L — AB (ref 135–145)
Total Bilirubin: 1.3 mg/dL — ABNORMAL HIGH (ref 0.3–1.2)
Total Protein: 8 g/dL (ref 6.5–8.1)

## 2017-06-22 LAB — I-STAT CG4 LACTIC ACID, ED: Lactic Acid, Venous: 0.98 mmol/L (ref 0.5–1.9)

## 2017-06-22 LAB — I-STAT CHEM 8, ED
BUN: 8 mg/dL (ref 6–20)
CALCIUM ION: 1.22 mmol/L (ref 1.15–1.40)
Chloride: 97 mmol/L — ABNORMAL LOW (ref 101–111)
Creatinine, Ser: 1 mg/dL (ref 0.44–1.00)
GLUCOSE: 70 mg/dL (ref 65–99)
HCT: 35 % — ABNORMAL LOW (ref 36.0–46.0)
HEMOGLOBIN: 11.9 g/dL — AB (ref 12.0–15.0)
Potassium: 3.7 mmol/L (ref 3.5–5.1)
SODIUM: 134 mmol/L — AB (ref 135–145)
TCO2: 21 mmol/L — ABNORMAL LOW (ref 22–32)

## 2017-06-22 LAB — LIPASE, BLOOD: LIPASE: 91 U/L — AB (ref 11–51)

## 2017-06-22 MED ORDER — LIDOCAINE HCL (PF) 1 % IJ SOLN
30.0000 mL | Freq: Once | INTRAMUSCULAR | Status: AC
  Administered 2017-06-22: 30 mL
  Filled 2017-06-22: qty 30

## 2017-06-22 MED ORDER — CYANOCOBALAMIN 1000 MCG/ML IJ SOLN
1000.0000 ug | INTRAMUSCULAR | Status: DC
  Administered 2017-06-23: 1000 ug via INTRAMUSCULAR
  Filled 2017-06-22: qty 1

## 2017-06-22 MED ORDER — SODIUM CHLORIDE 0.9 % IV SOLN
INTRAVENOUS | Status: AC
  Administered 2017-06-22 – 2017-06-24 (×4): via INTRAVENOUS

## 2017-06-22 MED ORDER — VANCOMYCIN HCL 10 G IV SOLR
2000.0000 mg | Freq: Once | INTRAVENOUS | Status: AC
  Administered 2017-06-22: 2000 mg via INTRAVENOUS
  Filled 2017-06-22: qty 2000

## 2017-06-22 MED ORDER — ADULT MULTIVITAMIN W/MINERALS CH
1.0000 | ORAL_TABLET | Freq: Every day | ORAL | Status: DC
  Administered 2017-06-23 – 2017-06-26 (×4): 1 via ORAL
  Filled 2017-06-22 (×4): qty 1

## 2017-06-22 MED ORDER — ONDANSETRON HCL 4 MG/2ML IJ SOLN
4.0000 mg | Freq: Four times a day (QID) | INTRAMUSCULAR | Status: DC | PRN
  Administered 2017-06-23: 4 mg via INTRAVENOUS
  Filled 2017-06-22: qty 2

## 2017-06-22 MED ORDER — HYDROCODONE-ACETAMINOPHEN 5-325 MG PO TABS
1.0000 | ORAL_TABLET | ORAL | Status: DC | PRN
  Administered 2017-06-22 – 2017-06-25 (×11): 2 via ORAL
  Filled 2017-06-22: qty 2
  Filled 2017-06-22: qty 1
  Filled 2017-06-22 (×5): qty 2
  Filled 2017-06-22: qty 1
  Filled 2017-06-22: qty 2
  Filled 2017-06-22: qty 1
  Filled 2017-06-22 (×3): qty 2

## 2017-06-22 MED ORDER — PIPERACILLIN-TAZOBACTAM 3.375 G IVPB: 3.3750 g | Freq: Four times a day (QID) | INTRAVENOUS | Status: DC

## 2017-06-22 MED ORDER — SODIUM CHLORIDE 0.9 % IV BOLUS (SEPSIS)
1000.0000 mL | Freq: Once | INTRAVENOUS | Status: AC
  Administered 2017-06-22: 1000 mL via INTRAVENOUS

## 2017-06-22 MED ORDER — IOPAMIDOL (ISOVUE-300) INJECTION 61%
100.0000 mL | Freq: Once | INTRAVENOUS | Status: AC | PRN
  Administered 2017-06-22: 100 mL via INTRAVENOUS

## 2017-06-22 MED ORDER — VANCOMYCIN HCL IN DEXTROSE 1-5 GM/200ML-% IV SOLN: 1000.0000 mg | Freq: Once | INTRAVENOUS | Status: DC

## 2017-06-22 MED ORDER — VANCOMYCIN HCL IN DEXTROSE 1-5 GM/200ML-% IV SOLN: 1000.0000 mg | INTRAVENOUS | Status: DC

## 2017-06-22 MED ORDER — HYDROMORPHONE HCL 1 MG/ML IJ SOLN
0.5000 mg | Freq: Once | INTRAMUSCULAR | Status: AC
  Administered 2017-06-22: 1 mg via INTRAVENOUS
  Filled 2017-06-22: qty 1

## 2017-06-22 MED ORDER — ONDANSETRON HCL 4 MG/2ML IJ SOLN
4.0000 mg | Freq: Once | INTRAMUSCULAR | Status: AC
  Administered 2017-06-22: 4 mg via INTRAVENOUS
  Filled 2017-06-22: qty 2

## 2017-06-22 MED ORDER — VITAMIN D 1000 UNITS PO TABS
1000.0000 [IU] | ORAL_TABLET | Freq: Every day | ORAL | Status: DC
  Administered 2017-06-23 – 2017-06-26 (×4): 1000 [IU] via ORAL
  Filled 2017-06-22 (×4): qty 1

## 2017-06-22 MED ORDER — HYDROMORPHONE HCL 1 MG/ML IJ SOLN
0.5000 mg | INTRAMUSCULAR | Status: DC | PRN
  Administered 2017-06-22 – 2017-06-25 (×8): 0.5 mg via INTRAVENOUS
  Filled 2017-06-22: qty 1
  Filled 2017-06-22 (×6): qty 0.5
  Filled 2017-06-22: qty 1

## 2017-06-22 MED ORDER — PIPERACILLIN-TAZOBACTAM 3.375 G IVPB
3.3750 g | Freq: Three times a day (TID) | INTRAVENOUS | Status: DC
  Administered 2017-06-23 – 2017-06-26 (×11): 3.375 g via INTRAVENOUS
  Filled 2017-06-22 (×10): qty 50

## 2017-06-22 MED ORDER — PIPERACILLIN-TAZOBACTAM 3.375 G IVPB 30 MIN
3.3750 g | Freq: Once | INTRAVENOUS | Status: AC
  Administered 2017-06-22: 3.375 g via INTRAVENOUS
  Filled 2017-06-22: qty 50

## 2017-06-22 MED ORDER — PANTOPRAZOLE SODIUM 40 MG PO TBEC
40.0000 mg | DELAYED_RELEASE_TABLET | Freq: Every day | ORAL | Status: DC
  Administered 2017-06-23 – 2017-06-26 (×4): 40 mg via ORAL
  Filled 2017-06-22 (×4): qty 1

## 2017-06-22 MED ORDER — VANCOMYCIN HCL IN DEXTROSE 1-5 GM/200ML-% IV SOLN
1000.0000 mg | INTRAVENOUS | Status: DC
  Administered 2017-06-23 – 2017-06-24 (×2): 1000 mg via INTRAVENOUS
  Filled 2017-06-22 (×2): qty 200

## 2017-06-22 NOTE — ED Provider Notes (Signed)
[place] DEPT Provider Note   CSN: [id number] Arrival date & time: 06/22/17  1520     History   Chief Complaint Chief Complaint  Patient presents with  . Abdominal Pain     HPI   Blood pressure (!) 154/81, pulse (!) 103, temperature 99.2 F (37.3 C), temperature source Oral, resp. rate 16, SpO2 98 %.  Emily Sanchez is a 62 y.o. female sent from primary care for abdominal wall abscess.  CT obtained this afternoon.  Patient with SBO status post ex lap, lysis of adhesion and repair of parastomal hernia on 2/22.  She states that she has been in pain since discharge with no fever, chills,  vomiting.  She has persistent nausea with reduced p.o. intake.  She is been taking over-the-counter medicines at home with little relief.  Past Medical History:  Diagnosis Date  . Anxiety   . Chronic kidney disease    stage 3   . Crohn's disease ([place])   . Depression   . Enteritis due to Clostridium difficile 08/16/2012  . 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 GERD (gastroesophageal reflux disease)   . HTN (hypertension)   . Ileostomy in place [facility])   . Low back pain    FMS  . Osteoarthritis    Dr Alvan Dame; both knees  . Perianal pain    Chronic, Post-Op  . Transfusion history    none recent  . Ulcerative colitis [facility])    Dr Sharlett Iles    Patient Active Problem List   Diagnosis Date Noted  . Surgical site infection 06/22/2017  . SBO (small bowel obstruction) ([place]) 06/08/2017  . Fracture of multiple ribs of left side 09/03/2015  . Liver laceration 09/03/2015  . Adrenal hemorrhage ([place]) 09/03/2015  . AKI (acute kidney injury) ([place]) 09/03/2015  . Kidney laceration, right 08/31/2015  . 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 Status post total colectomy and ileostomy for Crohn's 03/21/2015  . Ventral hernia 03/21/2015  . Well adult exam 02/27/2015  . Allergic rhinitis 08/16/2014  . Elevated serum creatinine 10/17/2012  . Enteritis  due to Clostridium difficile 08/16/2012  . Nausea 08/16/2012  . Angular stomatitis 03/30/2012  . Anemia, chronic disease 04/08/2011  . CRI (chronic renal insufficiency) 04/08/2011  . Osteoarthritis 06/18/2009  . PNEUMONIA 11/06/2008  . Fibromyalgia syndrome 08/13/2008  . OBESITY, MORBID 01/20/2008  . INSOMNIA, PERSISTENT 10/18/2007  . Chronic fatigue syndrome 10/18/2007  . VITAMIN D DEFICIENCY 07/15/2007  . WEIGHT GAIN 07/15/2007  . Anxiety state 02/17/2007  . GERD 02/17/2007  . LOW BACK PAIN 02/17/2007  . B12 deficiency 11/08/2006  . PANIC ATTACK 11/08/2006  . DEPRESSION 11/08/2006  . Essential hypertension 11/08/2006  . Crohn's disease ([place]) 11/08/2006    Past Surgical History:  Procedure Laterality Date  . BREATH TEK H PYLORI N/A 12/05/2013   Procedure: BREATH TEK H PYLORI;  Surgeon: Pedro Earls, MD;  Location: [facility];  Service: General;  Laterality: N/A;  . COLECTOMY  2001   ulcerative colitis  . ILEOSTOMY    . LAPAROSCOPIC GASTRIC SLEEVE RESECTION WITH HIATAL HERNIA REPAIR N/A 04/02/2015   Procedure: LAPAROSCOPIC LYSIS OF ADHESIONS GASTRIC SLEEVE RESECTION WITH OPEN VENTRAL HERNIA REPAIR;  Surgeon: Johnathan Hausen, MD;  Location: [facility];  Service: General;  Laterality: N/A;  . LAPAROSCOPIC LYSIS OF ADHESIONS  04/02/2015   Procedure: LAPAROSCOPIC LYSIS OF ADHESIONS;  Surgeon: Johnathan Hausen, MD;  Location: [facility];  Service: General;;  . LAPAROTOMY N/A 06/11/2017  Procedure: EXPLORATORY LAPAROTOMY, LYSIS OF ADHESIONS, REPAIR OF PEISTOMAL HERNIA;  Surgeon: Leighton Ruff, MD;  Location: [facility];  Service: General;  Laterality: N/A;  . TONSILLECTOMY    . TUBAL LIGATION    . UPPER GI ENDOSCOPY  04/02/2015   Procedure: UPPER GI ENDOSCOPY;  Surgeon: Johnathan Hausen, MD;  Location: [facility];  Service: General;;  . VENTRAL HERNIA REPAIR  04/02/2015   Procedure: HERNIA REPAIR VENTRAL ADULT;  Surgeon: Johnathan Hausen, MD;  Location: [facility];  Service: General;;    OB History     No data available       Home Medications    Prior to Admission medications   Medication Sig Start Date End Date Taking? Authorizing Provider  cholecalciferol (VITAMIN D) 1000 units tablet Take 1,000 Units by mouth daily.   Yes [provider]  cyanocobalamin (,VITAMIN B-12,) 1000 MCG/ML injection Inject 1 mL (1,000 mcg total) into the muscle every 14 (fourteen) days. 06/21/17  Yes Plotnikov, Evie Lacks, MD  Multiple Vitamin (MULTIVITAMIN WITH MINERALS) TABS tablet Take 1 tablet by mouth daily.   Yes [provider]  pantoprazole (PROTONIX) 40 MG tablet Take 1 tablet (40 mg total) by mouth daily. 10/13/16  Yes Plotnikov, Evie Lacks, MD  BD INTEGRA SYRINGE 25G X 1" 3 ML MISC TO BE USED FOR B12 INJECTIONS 02/17/16   Plotnikov, Evie Lacks, MD  fluconazole (DIFLUCAN) 100 MG tablet Take 2 tabs on day#1, then 1 tab daily on Days #2-10 06/21/17   Plotnikov, Evie Lacks, MD  ondansetron (ZOFRAN) 4 MG tablet Take 1 tablet (4 mg total) by mouth every 8 (eight) hours as needed for nausea or vomiting. 06/21/17   Plotnikov, Evie Lacks, MD  SYRINGE-NEEDLE, DISP, 3 ML (B-D INTEGRA SYRINGE) 23G X 1" 3 ML MISC 1 Device by Does not apply route every 30 (thirty) days. For B12 shots 12/10/15   Plotnikov, Evie Lacks, MD  traMADol-acetaminophen (ULTRACET) 37.5-325 MG tablet Take 1-2 tablets by mouth every 6 (six) hours as needed for severe pain. 06/21/17   Plotnikov, Evie Lacks, MD    Family History Family History  Problem Relation Age of Onset  . Heart disease Father 82       CHF  . Depression Sister   . Hypertension Unknown     Social History Social History   Tobacco Use  . Smoking status: Former Research scientist (life sciences)  . Smokeless tobacco: Never Used  . Tobacco comment: very light social only-quit many years ago  Substance Use Topics  . Alcohol use: No  . Drug use: No     Allergies   Amphetamine-dextroamphet er; Cymbalta [duloxetine hcl]; Erythromycin ethylsuccinate; and Morphine   Review of  Systems Review of Systems  A complete review of systems was obtained and all systems are negative except as noted in the HPI and PMH.   Physical Exam Updated Vital Signs BP 127/80 (BP Location: Right Arm)   Pulse (!) 111   Temp 98.9 F (37.2 C) (Oral)   Resp (!) 23   SpO2 100%   Physical Exam  Constitutional: She is oriented to person, place, and time. She appears well-developed and well-nourished. No distress.  HENT:  Head: Normocephalic and atraumatic.  Mouth/Throat: Oropharynx is clear and moist.  Eyes: Conjunctivae and EOM are normal. Pupils are equal, round, and reactive to light.  Neck: Normal range of motion.  Cardiovascular: Regular rhythm and intact distal pulses.  Mild tachycardia, regular  Pulmonary/Chest: Effort normal and breath sounds normal.  Abdominal: There is no  tenderness.  Right-sided ostomy in place with no gross surrounding skin breakdown.  Midline surgical scar erythematous and warm, no discharge.  Diffusely tender to palpation along the midline surgical scar.  No guarding or rebound.  Hypoactive bowel  Musculoskeletal: Normal range of motion.  Neurological: She is alert and oriented to person, place, and time.  Skin: She is not diaphoretic.  Psychiatric: She has a normal mood and affect.  Nursing note and vitals reviewed.    ED Treatments / Results  Labs (all labs ordered are listed, but only abnormal results are displayed) Labs Reviewed  CBC WITH DIFFERENTIAL/PLATELET - Abnormal; Notable for the following components:      Result Value   WBC 21.1 (*)    RBC 3.66 (*)    Hemoglobin 11.3 (*)    HCT 33.8 (*)    Platelets 473 (*)    Neutro Abs 17.9 (*)    Monocytes Absolute 1.2 (*)    All other components within normal limits  COMPREHENSIVE METABOLIC PANEL - Abnormal; Notable for the following components:   Sodium 134 (*)    Chloride 97 (*)    CO2 20 (*)    Creatinine, Ser 1.15 (*)    Albumin 3.0 (*)    Total Bilirubin 1.3 (*)    GFR calc non  Af Amer 50 (*)    GFR calc Af Amer 58 (*)    Anion gap 17 (*)    All other components within normal limits  LIPASE, BLOOD - Abnormal; Notable for the following components:   Lipase 91 (*)    All other components within normal limits  URINALYSIS, ROUTINE W REFLEX MICROSCOPIC - Abnormal; Notable for the following components:   Specific Gravity, Urine >1.046 (*)    Hgb urine dipstick SMALL (*)    Ketones, ur 80 (*)    Protein, ur 100 (*)    Squamous Epithelial / LPF 0-5 (*)    All other components within normal limits  I-STAT CHEM 8, ED - Abnormal; Notable for the following components:   Sodium 134 (*)    Chloride 97 (*)    TCO2 21 (*)    Hemoglobin 11.9 (*)    HCT 35.0 (*)    All other components within normal limits  CULTURE, BLOOD (ROUTINE X 2)  AEROBIC/ANAEROBIC CULTURE (SURGICAL/DEEP WOUND)  CULTURE, BLOOD (ROUTINE X 2) W REFLEX TO ID PANEL  I-STAT CG4 LACTIC ACID, ED    EKG  EKG Interpretation None       Radiology Ct Abdomen Pelvis W Contrast  Result Date: 06/22/2017 CLINICAL DATA:  62 y/o F; recent bowel surgery 06/11/2017 with removal of scar tissue and mass around ileostomy. History of Crohn's colitis. Nausea, low-grade fever, and leukocytosis. EXAM: CT ABDOMEN AND PELVIS WITH CONTRAST TECHNIQUE: Multidetector CT imaging of the abdomen and pelvis was performed using the standard protocol following bolus administration of intravenous contrast. CONTRAST:  11m ISOVUE-300 IOPAMIDOL (ISOVUE-300) INJECTION 61% COMPARISON:  06/08/2017 CT abdomen and pelvis. FINDINGS: Lower chest: 3 mm left lower lobe perifissural nodule, likely intrapulmonary lymph node, is stable. Hepatobiliary: Stable subcentimeter lucencies in the liver, likely cysts. Decompressed gallbladder. No biliary dilatation. Pancreas: Unremarkable. No pancreatic ductal dilatation or surrounding inflammatory changes. Spleen: Normal in size without focal abnormality. Adrenals/Urinary Tract: Adrenal glands are  unremarkable. Multiple cortical scars in the kidneys bilaterally. Kidneys are otherwise normal, without renal calculi, focal lesion, or hydronephrosis. Bladder is unremarkable. Stomach/Bowel: Partial gastrectomy. Mild wall thickening of small bowel in the mid abdomen with  interval decompression. Colectomy. Vascular/Lymphatic: Aortic atherosclerosis. No enlarged abdominal or pelvic lymph nodes. 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 Other: Midline anterior abdominal wall fluid collection centered in subcutaneous fat with small intraperitoneal component measuring 8.8 x 6.5 x 15.9 cm (AP x ML x CC series 2, image 58 and series 7, image 72). Collection in the right lower quadrant ileostomy measuring 1.8 x 10.5 x 7.8 cm (series 6, image 26 and series 7, image 37) centered in subcutaneous fat. Fluid collections demonstrate mild peripheral enhancement. Mild edema in the mesenteric. Musculoskeletal: No fracture is seen. Grade 1 L4-5 anterolisthesis and lower lumbar facet hypertrophy. IMPRESSION: 1. Large fluid collections centered in subcutaneous fat of the midline anterior abdominal wall and surrounding the ileostomy with mild rim enhancement which may represent infection. 2. Mild wall thickening of small bowel in the mid abdomen. Mild mesenteric edema. Inflammation may be due to recent surgery, history of Crohn's, or infection. Electronically Signed   By: Kristine Garbe M.D.   On: 06/22/2017 14:09    Procedures Procedures (including critical care time)  Medications Ordered in ED Medications  vancomycin (VANCOCIN) 2,000 mg in sodium chloride 0.9 % 500 mL IVPB (2,000 mg Intravenous New Bag/Given 06/22/17 1817)  HYDROmorphone (DILAUDID) injection 0.5 mg (1 mg Intravenous Given 06/22/17 1629)  ondansetron (ZOFRAN) injection 4 mg (4 mg Intravenous Given 06/22/17 1630)  sodium chloride 0.9 % bolus 1,000 mL (0 mLs Intravenous Stopped 06/22/17 1749)  piperacillin-tazobactam (ZOSYN)  IVPB 3.375 g (0 g Intravenous Stopped 06/22/17 1832)  lidocaine (PF) (XYLOCAINE) 1 % injection 30 mL (30 mLs Other Given by Other 06/22/17 1638)     Initial Impression / Assessment and Plan / ED Course  I have reviewed the triage vital signs and the nursing notes.  Pertinent labs & imaging results that were available during my care of the patient were reviewed by me and considered in my medical decision making (see chart for details).     Vitals:   06/22/17 1535 06/22/17 1757  BP: (!) 154/81 127/80  Pulse: (!) 103 (!) 111  Resp: 16 (!) 23  Temp: 99.2 F (37.3 C) 98.9 F (37.2 C)  TempSrc: Oral Oral  SpO2: 98% 100%    Medications  vancomycin (VANCOCIN) 2,000 mg in sodium chloride 0.9 % 500 mL IVPB (2,000 mg Intravenous New Bag/Given 06/22/17 1817)  HYDROmorphone (DILAUDID) injection 0.5 mg (1 mg Intravenous Given 06/22/17 1629)  ondansetron (ZOFRAN) injection 4 mg (4 mg Intravenous Given 06/22/17 1630)  sodium chloride 0.9 % bolus 1,000 mL (0 mLs Intravenous Stopped 06/22/17 1749)  piperacillin-tazobactam (ZOSYN) IVPB 3.375 g (0 g Intravenous Stopped 06/22/17 1832)  lidocaine (PF) (XYLOCAINE) 1 % injection 30 mL (30 mLs Other Given by Other 06/22/17 1638)    [name] is 62 y.o. female presenting with postop abdominal wall abscess.  Patient is status post ex lap for SBO and various other issues on the 22nd of February.  PCP obtain CT showing 8 cm abdominal wall abscess.  Patient surgical scar looks cellulitic, no active drainage.  Patient with mild tachycardia, low-grade fever, blood cultures, lactic acid, basic blood work pending.  She states that she has been n.p.o. all day aside from the oral contrast.  General surgery consult from [place] appreciated, they will be in to evaluate the patient.  I&D is performed by Dr. Barry Dienes, patient with greater than 80 ketones in her urine, white count elevated to 21, low-grade fever and tachycardia, will need admission for  cellulitis/dehydration.  Final Clinical Impressions(s) / ED Diagnoses   Final diagnoses:  Abdominal wall abscess at site of surgical wound  Dehydration    ED Discharge Orders    None       Challen Spainhour, Charna Elizabeth 06/22/17 Samara Snide, MD 06/23/17 [phone]

## 2017-06-22 NOTE — H&P (Signed)
History and Physical    Rainn Zupko ZHY:[id number] DOB: 04-Apr-1956 DOA: 06/22/2017  PCP: Cassandria Anger, MD   Patient coming from: home   Chief Complaint: surgical site infection  HPI: Emily Sanchez is a 62 y.o. 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  Patient reports that just prior to discharge she was told that her white blood cell count was elevated and she had episodes of some hypoglycemia.  She then went home and noted that while initially her pain with the small bowel obstruction was in the right lower quadrant of the pain that developed after the lysis of adhesions surgery was in the left lower quadrant and was not improved with medications.  She also noted significant nausea without any emesis.  She did notice diminished ostomy output as she was not eating as much.  She stick to a clear liquid diet.  She denies any fevers but did not check her temperature.  She did notice chills.  She also noted worsening pain and swelling around the incision site and tenderness to palpation as well as some erythema.  Patient was seen by PCP who checked lab work that noted an elevated white count was present.  Patient was given symptomatic management and a CT of her abdomen was ordered.  CT of the abdomen showed evidence of large fluid collection of the abdominal wall cavity and she was told to present to the ED.  She denies any shortness of breath, lower extremity edema, diarrhea, chest pain, rash.  ED Course: In the ED vitals were notable forTachycardia with heart rate in the 100s.  Labs were notable for a white count of 21.1 and mildly elevated platelets.  Lactic acid was normal.   Creatinine was 1.15.  Total bilirubin was mildly elevated to 1.3.  Patient was seen by general surgery who did a bedside incision and drainage and sent off for culture.  Patient was started on Vance vancomycin and Zosyn.  Review of Systems: As per HPI otherwise 10 point review of systems negative.    Past Medical History:  Diagnosis Date  . Anxiety   . Chronic kidney disease    stage 3   . Crohn's disease ([place])   . Depression   . Enteritis due to Clostridium difficile 08/16/2012  . 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 GERD (gastroesophageal reflux disease)   . HTN (hypertension)   . Ileostomy in place [facility])   . Low back pain    FMS  . Osteoarthritis    Dr Alvan Dame; both knees  . Perianal pain    Chronic, Post-Op  . Transfusion history    none recent  . Ulcerative colitis ([place])    Dr Sharlett Iles    Past Surgical History:  Procedure Laterality Date  . BREATH TEK H PYLORI N/A 12/05/2013   Procedure: BREATH TEK H PYLORI;  Surgeon: Pedro Earls, MD;  Location: [facility];  Service: General;  Laterality: N/A;  . COLECTOMY  2001   ulcerative colitis  . ILEOSTOMY    . LAPAROSCOPIC GASTRIC SLEEVE RESECTION WITH HIATAL HERNIA REPAIR N/A 04/02/2015   Procedure: LAPAROSCOPIC LYSIS OF ADHESIONS GASTRIC SLEEVE RESECTION WITH OPEN VENTRAL HERNIA REPAIR;  Surgeon: Johnathan Hausen, MD;  Location: [facility];  Service: General;  Laterality: N/A;  . LAPAROSCOPIC LYSIS OF ADHESIONS  04/02/2015   Procedure: LAPAROSCOPIC LYSIS OF ADHESIONS;  Surgeon: Johnathan Hausen, MD;  Location: [facility];  Service: General;;  . LAPAROTOMY N/A 06/11/2017   Procedure: EXPLORATORY LAPAROTOMY, LYSIS OF ADHESIONS, REPAIR OF PEISTOMAL HERNIA;  Surgeon: Leighton Ruff, MD;  Location: [facility];  Service: General;  Laterality: N/A;  . TONSILLECTOMY    . TUBAL LIGATION    . UPPER GI ENDOSCOPY  04/02/2015   Procedure: UPPER GI ENDOSCOPY;  Surgeon: Johnathan Hausen, MD;  Location: [facility];  Service: General;;  . VENTRAL HERNIA  REPAIR  04/02/2015   Procedure: HERNIA REPAIR VENTRAL ADULT;  Surgeon: Johnathan Hausen, MD;  Location: [facility];  Service: General;;     reports that she has quit smoking. she has never used smokeless tobacco. She reports that she does not drink alcohol or use drugs.  Allergies  Allergen Reactions  . Amphetamine-Dextroamphet Er Nausea And Vomiting  . Cymbalta [Duloxetine Hcl] Other (See Comments)    "Made me sleepy."  . Erythromycin Ethylsuccinate Nausea And Vomiting  . Morphine Nausea And Vomiting    Family History  Problem Relation Age of Onset  . Heart disease Father 47       CHF  . Depression Sister   . Hypertension Unknown    Unacceptable: Noncontributory, unremarkable, or negative. Acceptable: Family history reviewed and not pertinent (If you reviewed it)  Prior to Admission medications   Medication Sig Start Date End Date Taking? Authorizing Provider  cholecalciferol (VITAMIN D) 1000 units tablet Take 1,000 Units by mouth daily.   Yes [provider]  cyanocobalamin (,VITAMIN B-12,) 1000 MCG/ML injection Inject 1 mL (1,000 mcg total) into the muscle every 14 (fourteen) days. 06/21/17  Yes Plotnikov, Evie Lacks, MD  Multiple Vitamin (MULTIVITAMIN WITH MINERALS) TABS tablet Take 1 tablet by mouth daily.   Yes [provider]  pantoprazole (PROTONIX) 40 MG tablet Take 1 tablet (40 mg total) by mouth daily. 10/13/16  Yes Plotnikov, Evie Lacks, MD  BD INTEGRA SYRINGE 25G X 1" 3 ML MISC TO BE USED FOR B12 INJECTIONS 02/17/16   Plotnikov, Evie Lacks, MD  fluconazole (DIFLUCAN) 100 MG tablet Take 2 tabs on day#1, then 1 tab daily on Days #2-10 06/21/17   Plotnikov, Evie Lacks, MD  ondansetron (ZOFRAN) 4 MG tablet Take 1 tablet (4 mg total) by mouth every 8 (eight) hours as needed for nausea or vomiting. 06/21/17   Plotnikov, Evie Lacks, MD  SYRINGE-NEEDLE, DISP, 3 ML (B-D INTEGRA SYRINGE) 23G X 1" 3 ML MISC 1 Device by Does not apply route every 30 (thirty) days. For B12 shots  12/10/15   Plotnikov, Evie Lacks, MD  traMADol-acetaminophen (ULTRACET) 37.5-325 MG tablet Take 1-2 tablets by mouth every 6 (six) hours as needed for severe pain. 06/21/17   Plotnikov, Evie Lacks, MD    Physical Exam: Vitals:   06/22/17 1535 06/22/17 1757  BP: (!) 154/81 127/80  Pulse: (!) 103 (!) 111  Resp: 16 (!) 23  Temp: 99.2 F (37.3 C) 98.9 F (37.2 C)  TempSrc: Oral Oral  SpO2: 98% 100%    Constitutional: NAD, calm, comfortable Vitals:   06/22/17 1535 06/22/17 1757  BP: (!) 154/81 127/80  Pulse: (!) 103 (!) 111  Resp: 16 (!) 23  Temp: 99.2 F (37.3 C) 98.9 F (37.2 C)  TempSrc: Oral Oral  SpO2: 98% 100%   Eyes: Anicteric sclera ENMT: Dry mucous membranes Neck: normal, supple Respiratory: clear to auscultation bilaterally, no  wheezing, no crackles. Normal respiratory effort. No accessory muscle use.  Cardiovascular: Tachycardia, regular rhythm, no murmurs Abdomen: Ostomy site is clean dry and intact, incision site is currently covered, abdomen is soft, tender to palpation in right lower and left lower quadrant and around incision site, plus bowel sounds Musculoskeletal: Trace lower extremity edema.  Skin: Incision site is currently packed with foul-smelling purulent drainage coming out, surrounding erythema and tenderness to palpation Neurologic: Grossly intact moving all extremities Psychiatric: Normal judgment and insight. Alert and oriented x 3. Normal mood.   (Anything < 9 systems with 2 bullets each down codes to level 1) (If patient refuses exam can't bill higher level) (Make sure to document decubitus ulcers present on admission -- if possible -- and whether patient has chronic indwelling catheter at time of admission)  Labs on Admission: I have personally reviewed following labs and imaging studies  CBC: Recent Labs  Lab 06/16/17 0338 06/16/17 1246 06/21/17 1530 06/22/17 1545 06/22/17 1613  WBC 12.3*  --  18.3 Repeated and verified X2.* 21.1*  --     NEUTROABS  --   --  14.6* 17.9*  --   HGB 9.8* 10.4* 11.6* 11.3* 11.9*  HCT 29.6* 32.2* 34.8* 33.8* 35.0*  MCV 94.6  --  91.4 92.3  --   PLT 250  --  431.0* 473*  --    Basic Metabolic Panel: Recent Labs  Lab 06/16/17 0338 06/21/17 1530 06/22/17 1545 06/22/17 1613  NA 138 135 134* 134*  K 3.7 4.7 3.8 3.7  CL 110 97 97* 97*  CO2 21* 26 20*  --   GLUCOSE 84 72 73 70  BUN 12 13 11 8   CREATININE 1.16* 0.97 1.15* 1.00  CALCIUM 8.8* 10.1 9.3  --    GFR: Estimated Creatinine Clearance: 68.8 mL/min (by C-G formula based on SCr of 1 mg/dL). Liver Function Tests: Recent Labs  Lab 06/21/17 1530 06/22/17 1545  AST 12 18  ALT 14 17  ALKPHOS 108 115  BILITOT 0.4 1.3*  PROT 8.2 8.0  ALBUMIN 3.3* 3.0*   Recent Labs  Lab 06/22/17 1545  LIPASE 91*   No results for input(s): AMMONIA in the last 168 hours. Coagulation Profile: No results for input(s): INR, PROTIME in the last 168 hours. Cardiac Enzymes: No results for input(s): CKTOTAL, CKMB, CKMBINDEX, TROPONINI in the last 168 hours. BNP (last 3 results) No results for input(s): PROBNP in the last 8760 hours. HbA1C: No results for input(s): HGBA1C in the last 72 hours. CBG: Recent Labs  Lab 06/15/17 1830 06/15/17 2131 06/16/17 0800 06/16/17 1129  GLUCAP 97 75 74 79   Lipid Profile: No results for input(s): CHOL, HDL, LDLCALC, TRIG, CHOLHDL, LDLDIRECT in the last 72 hours. Thyroid Function Tests: No results for input(s): TSH, T4TOTAL, FREET4, T3FREE, THYROIDAB in the last 72 hours. Anemia Panel: No results for input(s): VITAMINB12, FOLATE, FERRITIN, TIBC, IRON, RETICCTPCT in the last 72 hours. Urine analysis:    Component Value Date/Time   COLORURINE YELLOW 06/22/2017 1545   APPEARANCEUR CLEAR 06/22/2017 1545   LABSPEC >1.046 (H) 06/22/2017 1545   PHURINE 5.0 06/22/2017 1545   GLUCOSEU NEGATIVE 06/22/2017 1545   GLUCOSEU NEGATIVE 02/10/2013 0914   HGBUR SMALL (A) 06/22/2017 1545   BILIRUBINUR NEGATIVE  06/22/2017 1545   KETONESUR 80 (A) 06/22/2017 1545   PROTEINUR 100 (A) 06/22/2017 1545   UROBILINOGEN 0.2 02/10/2013 0914   NITRITE NEGATIVE 06/22/2017 1545   LEUKOCYTESUR NEGATIVE 06/22/2017 1545    Radiological Exams on  Admission: Ct Abdomen Pelvis W Contrast  Result Date: 06/22/2017 CLINICAL DATA:  62 y/o F; recent bowel surgery 06/11/2017 with removal of scar tissue and mass around ileostomy. History of Crohn's colitis. Nausea, low-grade fever, and leukocytosis. EXAM: CT ABDOMEN AND PELVIS WITH CONTRAST TECHNIQUE: Multidetector CT imaging of the abdomen and pelvis was performed using the standard protocol following bolus administration of intravenous contrast. CONTRAST:  124m ISOVUE-300 IOPAMIDOL (ISOVUE-300) INJECTION 61% COMPARISON:  06/08/2017 CT abdomen and pelvis. FINDINGS: Lower chest: 3 mm left lower lobe perifissural nodule, likely intrapulmonary lymph node, is stable. Hepatobiliary: Stable subcentimeter lucencies in the liver, likely cysts. Decompressed gallbladder. No biliary dilatation. Pancreas: Unremarkable. No pancreatic ductal dilatation or surrounding inflammatory changes. Spleen: Normal in size without focal abnormality. Adrenals/Urinary Tract: Adrenal glands are unremarkable. Multiple cortical scars in the kidneys bilaterally. Kidneys are otherwise normal, without renal calculi, focal lesion, or hydronephrosis. Bladder is unremarkable. Stomach/Bowel: Partial gastrectomy. Mild wall thickening of small bowel in the mid abdomen with interval decompression. Colectomy. Vascular/Lymphatic: Aortic atherosclerosis. No enlarged abdominal or pelvic lymph nodes. 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 Other: Midline anterior abdominal wall fluid collection centered in subcutaneous fat with small intraperitoneal component measuring 8.8 x 6.5 x 15.9 cm (AP x ML x CC series 2, image 58 and series 7, image 72). Collection in the right lower quadrant ileostomy  measuring 1.8 x 10.5 x 7.8 cm (series 6, image 26 and series 7, image 37) centered in subcutaneous fat. Fluid collections demonstrate mild peripheral enhancement. Mild edema in the mesenteric. Musculoskeletal: No fracture is seen. Grade 1 L4-5 anterolisthesis and lower lumbar facet hypertrophy. IMPRESSION: 1. Large fluid collections centered in subcutaneous fat of the midline anterior abdominal wall and surrounding the ileostomy with mild rim enhancement which may represent infection. 2. Mild wall thickening of small bowel in the mid abdomen. Mild mesenteric edema. Inflammation may be due to recent surgery, history of Crohn's, or infection. Electronically Signed   By: LKristine GarbeM.D.   On: 06/22/2017 14:09    Assessment/Plan Principal Problem:   Surgical site infection Active Problems:   B12 deficiency   [place]   Status post total colectomy and ileostomy for Crohn's   S/P laparoscopic sleeve gastrectomy Dec 2016   #) Abdominal wall abscess: Most consistent with surgical site infection with significant amounts of purulence.  Surgery came and performed bedside debridement and sent off culture. -Continue vancomycin and Zosyn started 06/22/2017 -Follow-up wound culture -General surgery following appreciate recommendations -Hydromorphone for pain control -Norco for oral pain control -Ondansetron for antiemetic -IV fluids 100 mils an hour  #) Acute kidney injury: Likely secondary to decreased p.o. intake -IV fluids -Hold nephrotoxins  #) Hypertension: In chart but patient denies any history and is not on any hypertensive medications. -Monitor  #) Status post gastric sleeve: -Continue vitamin D supplementation and B12 IM supplementation  #) Crohn's/ulcerative colitis: Currently quiescent  Fluids: IV fluids-electrolytes: Monitor and supplement Nutrition: Regular diet  Prophylaxis: SCDs  Disposition: Pending good source control and transition to oral  medications  Full code   SCristy FolksMD [facility]  If 7PM-7AM, please contact night-coverage [email or web address] Password [email or web address]  06/22/2017, 6:20 PM

## 2017-06-22 NOTE — ED Notes (Signed)
Per GI, states she had a CT which should free fluid-states recent SBO-sending over for possible abdominal abscess-states they discussed case with Dr. Dema Severin with surgery

## 2017-06-22 NOTE — ED Notes (Signed)
ED TO INPATIENT HANDOFF REPORT  Name/Age/Gender Emily Sanchez 62 y.o. female  Code Status Code Status History    Date Active Date Inactive Code Status Order ID Comments User Context   06/08/2017 07:45 06/16/2017 19:22 Full Code [id number]  Amin, Ankit Chirag, MD ED   08/31/2015 04:05 09/04/2015 19:07 Full Code [id number]  Wyatt, James, MD ED   04/02/2015 20:30 04/08/2015 15:12 Full Code [id number]  Martin, Matthew, MD Inpatient   08/16/2012 04:54 08/18/2012 16:22 Full Code [id number]  Le, Peter, MD Inpatient      Home/SNF/Other Home  Chief Complaint R/O abdominal abscess  Level of Care/Admitting Diagnosis ED Disposition    ED Disposition Condition Comment   Admit  Hospital Area: [facility] [[id number]]  Level of Care: Med-Surg [16]  Diagnosis: Surgical site infection [[id number]]  Admitting Physician: PUROHIT, SHREY C [[id number]]  Attending Physician: PUROHIT, SHREY C [[id number]]  Estimated length of stay: 3 - 4 days  Certification:: I certify this patient will need inpatient services for at least 2 midnights  PT Class (Do Not Modify): Inpatient [101]  PT Acc Code (Do Not Modify): Private [1]       Medical History Past Medical History:  Diagnosis Date  . Anxiety   . Chronic kidney disease    stage 3   . Crohn's disease ([facility])   . Depression   . Enteritis due to Clostridium difficile 08/16/2012  . 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 GERD (gastroesophageal reflux disease)   . HTN (hypertension)   . Ileostomy in place ([facility])   . Low back pain    FMS  . Osteoarthritis    Dr Olin; both knees  . Perianal pain    Chronic, Post-Op  . Transfusion history    none recent  . Ulcerative colitis ([facility])    Dr Patterson    Allergies Allergies  Allergen Reactions  . Amphetamine-Dextroamphet Er Nausea And Vomiting  . Cymbalta [Duloxetine Hcl] Other (See Comments)    "Made me sleepy."  . Erythromycin Ethylsuccinate Nausea And Vomiting  . Morphine Nausea And Vomiting     IV Location/Drains/Wounds Patient Lines/Drains/Airways Status   Active Line/Drains/Airways    Name:   Placement date:   Placement time:   Site:   Days:   Peripheral IV 06/22/17 Left Antecubital   06/22/17    1342    Antecubital   less than 1   Colostomy RLQ   04/02/15    --    RLQ   812   Incision (Closed) 06/11/17 Abdomen   06/11/17    1151     11          Labs/Imaging Results for orders placed or performed during the hospital encounter of 06/22/17 (from the past 48 hour(s))  CBC with Differential     Status: Abnormal   Collection Time: 06/22/17  3:45 PM  Result Value Ref Range   WBC 21.1 (H) 4.0 - 10.5 K/uL   RBC 3.66 (L) 3.87 - 5.11 MIL/uL   Hemoglobin 11.3 (L) 12.0 - 15.0 g/dL   HCT 33.8 (L) 36.0 - 46.0 %   MCV 92.3 78.0 - 100.0 fL   MCH 30.9 26.0 - 34.0 pg   MCHC 33.4 30.0 - 36.0 g/dL   RDW 14.1 11.5 - 15.5 %   Platelets 473 (H) 150 - 400 K/uL   Neutrophils Relative % 85 %   Neutro Abs 17.9 (H) 1.7 - 7.7 K/uL   Lymphocytes Relative 9 %   Lymphs Abs   2.0 0.7 - 4.0 K/uL   Monocytes Relative 6 %   Monocytes Absolute 1.2 (H) 0.1 - 1.0 K/uL   Eosinophils Relative 0 %   Eosinophils Absolute 0.0 0.0 - 0.7 K/uL   Basophils Relative 0 %   Basophils Absolute 0.0 0.0 - 0.1 K/uL    Comment: Performed at [facility], [address] [place], [place] [address]  Comprehensive metabolic panel     Status: Abnormal   Collection Time: 06/22/17  3:45 PM  Result Value Ref Range   Sodium 134 (L) 135 - 145 mmol/L   Potassium 3.8 3.5 - 5.1 mmol/L   Chloride 97 (L) 101 - 111 mmol/L   CO2 20 (L) 22 - 32 mmol/L   Glucose, Bld 73 65 - 99 mg/dL   BUN 11 6 - 20 mg/dL   Creatinine, Ser 1.15 (H) 0.44 - 1.00 mg/dL   Calcium 9.3 8.9 - 10.3 mg/dL   Total Protein 8.0 6.5 - 8.1 g/dL   Albumin 3.0 (L) 3.5 - 5.0 g/dL   AST 18 15 - 41 U/L   ALT 17 14 - 54 U/L   Alkaline Phosphatase 115 38 - 126 U/L   Total Bilirubin 1.3 (H) 0.3 - 1.2 mg/dL   GFR calc non Af Amer 50 (L) >60  mL/min   GFR calc Af Amer 58 (L) >60 mL/min    Comment: (NOTE) The eGFR has been calculated using the CKD EPI equation. This calculation has not been validated in all clinical situations. eGFR's persistently <60 mL/min signify possible Chronic Kidney Disease.    Anion gap 17 (H) 5 - 15    Comment: Performed at [facility], [address] [place], [place] [address]  Lipase, blood     Status: Abnormal   Collection Time: 06/22/17  3:45 PM  Result Value Ref Range   Lipase 91 (H) 11 - 51 U/L    Comment: Performed at [facility], [address] [place], [place] [address]  Urinalysis, Routine w reflex microscopic     Status: Abnormal   Collection Time: 06/22/17  3:45 PM  Result Value Ref Range   Color, Urine YELLOW YELLOW   APPearance CLEAR CLEAR   Specific Gravity, Urine >1.046 (H) 1.005 - 1.030   pH 5.0 5.0 - 8.0   Glucose, UA NEGATIVE NEGATIVE mg/dL   Hgb urine dipstick SMALL (A) NEGATIVE   Bilirubin Urine NEGATIVE NEGATIVE   Ketones, ur 80 (A) NEGATIVE mg/dL   Protein, ur 100 (A) NEGATIVE mg/dL   Nitrite NEGATIVE NEGATIVE   Leukocytes, UA NEGATIVE NEGATIVE   RBC / HPF 0-5 0 - 5 RBC/hpf   WBC, UA 0-5 0 - 5 WBC/hpf   Bacteria, UA NONE SEEN NONE SEEN   Squamous Epithelial / LPF 0-5 (A) NONE SEEN    Comment: Performed at [facility], [address] [place], [place] [address]  I-Stat Chem 8, ED     Status: Abnormal   Collection Time: 06/22/17  4:13 PM  Result Value Ref Range   Sodium 134 (L) 135 - 145 mmol/L   Potassium 3.7 3.5 - 5.1 mmol/L   Chloride 97 (L) 101 - 111 mmol/L   BUN 8 6 - 20 mg/dL   Creatinine, Ser 1.00 0.44 - 1.00 mg/dL   Glucose, Bld 70 65 - 99 mg/dL   Calcium, Ion 1.22 1.15 - 1.40 mmol/L   TCO2 21 (L) 22 - 32 mmol/L   Hemoglobin 11.9 (L) 12.0 -   15.0 g/dL   HCT 35.0 (L) 36.0 - 46.0 %  I-Stat CG4 Lactic Acid, ED     Status: None   Collection Time: 06/22/17  4:13 PM  Result Value Ref Range   Lactic  Acid, Venous 0.98 0.5 - 1.9 mmol/L  Blood culture (routine x 2)     Status: None (Preliminary result)   Collection Time: 06/22/17  4:35 PM  Result Value Ref Range   Specimen Description BLOOD LEFT ANTECUBITAL    Special Requests      BOTTLES DRAWN AEROBIC AND ANAEROBIC Blood Culture adequate volume   Culture PENDING    Report Status PENDING    Ct Abdomen Pelvis W Contrast  Result Date: 06/22/2017 CLINICAL DATA:  62 y/o F; recent bowel surgery 06/11/2017 with removal of scar tissue and mass around ileostomy. History of Crohn's colitis. Nausea, low-grade fever, and leukocytosis. EXAM: CT ABDOMEN AND PELVIS WITH CONTRAST TECHNIQUE: Multidetector CT imaging of the abdomen and pelvis was performed using the standard protocol following bolus administration of intravenous contrast. CONTRAST:  140m ISOVUE-300 IOPAMIDOL (ISOVUE-300) INJECTION 61% COMPARISON:  06/08/2017 CT abdomen and pelvis. FINDINGS: Lower chest: 3 mm left lower lobe perifissural nodule, likely intrapulmonary lymph node, is stable. Hepatobiliary: Stable subcentimeter lucencies in the liver, likely cysts. Decompressed gallbladder. No biliary dilatation. Pancreas: Unremarkable. No pancreatic ductal dilatation or surrounding inflammatory changes. Spleen: Normal in size without focal abnormality. Adrenals/Urinary Tract: Adrenal glands are unremarkable. Multiple cortical scars in the kidneys bilaterally. Kidneys are otherwise normal, without renal calculi, focal lesion, or hydronephrosis. Bladder is unremarkable. Stomach/Bowel: Partial gastrectomy. Mild wall thickening of small bowel in the mid abdomen with interval decompression. Colectomy. Vascular/Lymphatic: Aortic atherosclerosis. No enlarged abdominal or pelvic lymph nodes. 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 Other: Midline anterior abdominal wall fluid collection centered in subcutaneous fat with small intraperitoneal component measuring 8.8 x 6.5 x 15.9  cm (AP x ML x CC series 2, image 58 and series 7, image 72). Collection in the right lower quadrant ileostomy measuring 1.8 x 10.5 x 7.8 cm (series 6, image 26 and series 7, image 37) centered in subcutaneous fat. Fluid collections demonstrate mild peripheral enhancement. Mild edema in the mesenteric. Musculoskeletal: No fracture is seen. Grade 1 L4-5 anterolisthesis and lower lumbar facet hypertrophy. IMPRESSION: 1. Large fluid collections centered in subcutaneous fat of the midline anterior abdominal wall and surrounding the ileostomy with mild rim enhancement which may represent infection. 2. Mild wall thickening of small bowel in the mid abdomen. Mild mesenteric edema. Inflammation may be due to recent surgery, history of Crohn's, or infection. Electronically Signed   By: LKristine GarbeM.D.   On: 06/22/2017 14:09    Pending Labs Unresulted Labs (From admission, onward)   Start     Ordered   06/23/17 0500  Creatinine, serum  Daily,   R    Comments:  While on vanc/zosyn    06/22/17 1902   06/22/17 1745  Culture, blood (Routine X 2) w Reflex to ID Panel  Once,   R     06/22/17 1745   06/22/17 1708  Aerobic/Anaerobic Culture (surgical/deep wound)  Once,   R    Comments:  Subcutaneous abscess midline abdomen from previous surgical site   Question:  Patient immune status  Answer:  Normal   06/22/17 1708   Signed and Held  Basic metabolic panel  Tomorrow morning,   R     Signed and Held   Signed and Held  CBC  Tomorrow morning,   R     Signed and Held      Vitals/Pain Today's Vitals   06/22/17 1705 06/22/17 1751 06/22/17 1757 06/22/17 1800  BP:   127/80 125/85  Pulse:   (!) 111 (!) 113  Resp:   (!) 23   Temp:   98.9 F (37.2 C)   TempSrc:   Oral   SpO2:   100% 100%  PainSc: 7  7       Isolation Precautions No active isolations  Medications Medications  vancomycin (VANCOCIN) 2,000 mg in sodium chloride 0.9 % 500 mL IVPB (2,000 mg Intravenous New Bag/Given 06/22/17 1817)   vancomycin (VANCOCIN) IVPB 1000 mg/200 mL premix (not administered)  piperacillin-tazobactam (ZOSYN) IVPB 3.375 g (not administered)  HYDROmorphone (DILAUDID) injection 0.5 mg (1 mg Intravenous Given 06/22/17 1629)  ondansetron (ZOFRAN) injection 4 mg (4 mg Intravenous Given 06/22/17 1630)  sodium chloride 0.9 % bolus 1,000 mL (0 mLs Intravenous Stopped 06/22/17 1749)  piperacillin-tazobactam (ZOSYN) IVPB 3.375 g (0 g Intravenous Stopped 06/22/17 1832)  lidocaine (PF) (XYLOCAINE) 1 % injection 30 mL (30 mLs Other Given by Other 06/22/17 1638)    Mobility walks with person assist  

## 2017-06-22 NOTE — Consult Note (Signed)
Reason for Consult:   Abdominal pain Referring Physician: Ellender Hose, MD  Emily Sanchez is an 62 y.o. female.  HPI:  Pt is a 62 yo F s/p ex lap and repair of parastomal hernia 06/11/17 for small bowel obstruction.  She has numerous medical problems.  She was discharged to home 06/16/2017.  She has been having no change in her bowel habits.  She has been able to eat.  She has had some nausea.  She denies fever, but has had chills.  She has not had any drainage from her wound.  She has had increasing pain in her incision and has had difficulty ambulating over the last few days.    She was seen by her pcp, Dr. Alain Marion and an outpatient CT was ordered.  At his visit yesterday there was not evidence of erythema.  The CT was done today and showed a fluid collection in the abdominal wall.    She came to the ED for treatment.    Past Medical History:  Diagnosis Date  . Anxiety   . Chronic kidney disease    stage 3   . Crohn's disease ([place])   . Depression   . Enteritis due to Clostridium difficile 08/16/2012  . 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 GERD (gastroesophageal reflux disease)   . HTN (hypertension)   . Ileostomy in place [facility])   . Low back pain    FMS  . Osteoarthritis    Dr Alvan Dame; both knees  . Perianal pain    Chronic, Post-Op  . Transfusion history    none recent  . Ulcerative colitis ([place])    Dr Sharlett Iles    Past Surgical History:  Procedure Laterality Date  . BREATH TEK H PYLORI N/A 12/05/2013   Procedure: BREATH TEK H PYLORI;  Surgeon: Pedro Earls, MD;  Location: [facility];  Service: General;  Laterality: N/A;  . COLECTOMY  2001   ulcerative colitis  . ILEOSTOMY    . LAPAROSCOPIC GASTRIC SLEEVE RESECTION WITH HIATAL HERNIA REPAIR N/A 04/02/2015   Procedure: LAPAROSCOPIC LYSIS OF ADHESIONS GASTRIC SLEEVE RESECTION WITH OPEN VENTRAL HERNIA REPAIR;  Surgeon: Johnathan Hausen, MD;  Location: [facility];  Service: General;  Laterality: N/A;  . LAPAROSCOPIC LYSIS OF  ADHESIONS  04/02/2015   Procedure: LAPAROSCOPIC LYSIS OF ADHESIONS;  Surgeon: Johnathan Hausen, MD;  Location: [facility];  Service: General;;  . LAPAROTOMY N/A 06/11/2017   Procedure: EXPLORATORY LAPAROTOMY, LYSIS OF ADHESIONS, REPAIR OF PEISTOMAL HERNIA;  Surgeon: Leighton Ruff, MD;  Location: [facility];  Service: General;  Laterality: N/A;  . TONSILLECTOMY    . TUBAL LIGATION    . UPPER GI ENDOSCOPY  04/02/2015   Procedure: UPPER GI ENDOSCOPY;  Surgeon: Johnathan Hausen, MD;  Location: [facility];  Service: General;;  . VENTRAL HERNIA REPAIR  04/02/2015   Procedure: HERNIA REPAIR VENTRAL ADULT;  Surgeon: Johnathan Hausen, MD;  Location: [facility];  Service: General;;    Family History  Problem Relation Age of Onset  . Heart disease Father 72       CHF  . Depression Sister   . Hypertension Unknown     Social History:  reports that she has quit smoking. she has never used smokeless tobacco. She reports that she does not drink alcohol or use drugs.  Allergies:  Allergies  Allergen Reactions  . Amphetamine-Dextroamphet Er Nausea And Vomiting  . Cymbalta [Duloxetine Hcl] Other (See Comments)    "Made me sleepy."  . Erythromycin Ethylsuccinate Nausea And Vomiting  .  Morphine Nausea And Vomiting    Medications:  Current Meds  Medication Sig  . cholecalciferol (VITAMIN D) 1000 units tablet Take 1,000 Units by mouth daily.  . cyanocobalamin (,VITAMIN B-12,) 1000 MCG/ML injection Inject 1 mL (1,000 mcg total) into the muscle every 14 (fourteen) days.  . Multiple Vitamin (MULTIVITAMIN WITH MINERALS) TABS tablet Take 1 tablet by mouth daily.  . pantoprazole (PROTONIX) 40 MG tablet Take 1 tablet (40 mg total) by mouth daily.     Results for orders placed or performed during the hospital encounter of 06/22/17 (from the past 48 hour(s))  CBC with Differential     Status: Abnormal   Collection Time: 06/22/17  3:45 PM  Result Value Ref Range   WBC 21.1 (H) 4.0 - 10.5 K/uL   RBC 3.66 (L) 3.87 - 5.11  MIL/uL   Hemoglobin 11.3 (L) 12.0 - 15.0 g/dL   HCT 33.8 (L) 36.0 - 46.0 %   MCV 92.3 78.0 - 100.0 fL   MCH 30.9 26.0 - 34.0 pg   MCHC 33.4 30.0 - 36.0 g/dL   RDW 14.1 11.5 - 15.5 %   Platelets 473 (H) 150 - 400 K/uL   Neutrophils Relative % 85 %   Neutro Abs 17.9 (H) 1.7 - 7.7 K/uL   Lymphocytes Relative 9 %   Lymphs Abs 2.0 0.7 - 4.0 K/uL   Monocytes Relative 6 %   Monocytes Absolute 1.2 (H) 0.1 - 1.0 K/uL   Eosinophils Relative 0 %   Eosinophils Absolute 0.0 0.0 - 0.7 K/uL   Basophils Relative 0 %   Basophils Absolute 0.0 0.0 - 0.1 K/uL    Comment: Performed at [facility], [place] [address] [place], [place] [address]  Urinalysis, Routine w reflex microscopic     Status: Abnormal   Collection Time: 06/22/17  3:45 PM  Result Value Ref Range   Color, Urine YELLOW YELLOW   APPearance CLEAR CLEAR   Specific Gravity, Urine >1.046 (H) 1.005 - 1.030   pH 5.0 5.0 - 8.0   Glucose, UA NEGATIVE NEGATIVE mg/dL   Hgb urine dipstick SMALL (A) NEGATIVE   Bilirubin Urine NEGATIVE NEGATIVE   Ketones, ur 80 (A) NEGATIVE mg/dL   Protein, ur 100 (A) NEGATIVE mg/dL   Nitrite NEGATIVE NEGATIVE   Leukocytes, UA NEGATIVE NEGATIVE   RBC / HPF 0-5 0 - 5 RBC/hpf   WBC, UA 0-5 0 - 5 WBC/hpf   Bacteria, UA NONE SEEN NONE SEEN   Squamous Epithelial / LPF 0-5 (A) NONE SEEN    Comment: Performed at [facility], [place] [address], [place], [place] [address]  I-Stat Chem 8, ED     Status: Abnormal   Collection Time: 06/22/17  4:13 PM  Result Value Ref Range   Sodium 134 (L) 135 - 145 mmol/L   Potassium 3.7 3.5 - 5.1 mmol/L   Chloride 97 (L) 101 - 111 mmol/L   BUN 8 6 - 20 mg/dL   Creatinine, Ser 1.00 0.44 - 1.00 mg/dL   Glucose, Bld 70 65 - 99 mg/dL   Calcium, Ion 1.22 1.15 - 1.40 mmol/L   TCO2 21 (L) 22 - 32 mmol/L   Hemoglobin 11.9 (L) 12.0 - 15.0 g/dL   HCT 35.0 (L) 36.0 - 46.0 %  I-Stat CG4 Lactic Acid, ED     Status: None   Collection Time: 06/22/17  4:13  PM  Result Value Ref Range   Lactic Acid, Venous 0.98 0.5 - 1.9 mmol/L  Ct Abdomen Pelvis W Contrast  Result Date: 06/22/2017 CLINICAL DATA:  62 y/o F; recent bowel surgery 06/11/2017 with removal of scar tissue and mass around ileostomy. History of Crohn's colitis. Nausea, low-grade fever, and leukocytosis. EXAM: CT ABDOMEN AND PELVIS WITH CONTRAST TECHNIQUE: Multidetector CT imaging of the abdomen and pelvis was performed using the standard protocol following bolus administration of intravenous contrast. CONTRAST:  171m ISOVUE-300 IOPAMIDOL (ISOVUE-300) INJECTION 61% COMPARISON:  06/08/2017 CT abdomen and pelvis. FINDINGS: Lower chest: 3 mm left lower lobe perifissural nodule, likely intrapulmonary lymph node, is stable. Hepatobiliary: Stable subcentimeter lucencies in the liver, likely cysts. Decompressed gallbladder. No biliary dilatation. Pancreas: Unremarkable. No pancreatic ductal dilatation or surrounding inflammatory changes. Spleen: Normal in size without focal abnormality. Adrenals/Urinary Tract: Adrenal glands are unremarkable. Multiple cortical scars in the kidneys bilaterally. Kidneys are otherwise normal, without renal calculi, focal lesion, or hydronephrosis. Bladder is unremarkable. Stomach/Bowel: Partial gastrectomy. Mild wall thickening of small bowel in the mid abdomen with interval decompression. Colectomy. Vascular/Lymphatic: Aortic atherosclerosis. No enlarged abdominal or pelvic lymph nodes. 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 Other: Midline anterior abdominal wall fluid collection centered in subcutaneous fat with small intraperitoneal component measuring 8.8 x 6.5 x 15.9 cm (AP x ML x CC series 2, image 58 and series 7, image 72). Collection in the right lower quadrant ileostomy measuring 1.8 x 10.5 x 7.8 cm (series 6, image 26 and series 7, image 37) centered in subcutaneous fat. Fluid collections demonstrate mild peripheral enhancement. Mild  edema in the mesenteric. Musculoskeletal: No fracture is seen. Grade 1 L4-5 anterolisthesis and lower lumbar facet hypertrophy. IMPRESSION: 1. Large fluid collections centered in subcutaneous fat of the midline anterior abdominal wall and surrounding the ileostomy with mild rim enhancement which may represent infection. 2. Mild wall thickening of small bowel in the mid abdomen. Mild mesenteric edema. Inflammation may be due to recent surgery, history of Crohn's, or infection. Electronically Signed   By: LKristine GarbeM.D.   On: 06/22/2017 14:09    Review of Systems  Constitutional: Positive for chills and malaise/fatigue. Negative for diaphoresis, fever and weight loss.  HENT: Negative.   Eyes: Negative.   Respiratory: Negative.   Cardiovascular: Negative.   Gastrointestinal: Positive for abdominal pain. Negative for nausea and vomiting.  Genitourinary: Negative.   Musculoskeletal: Negative.   Skin:       Redness on skin  Neurological: Negative.  Negative for weakness.  Endo/Heme/Allergies: Negative.   Psychiatric/Behavioral: Negative.    Blood pressure (!) 154/81, pulse (!) 103, temperature 99.2 F (37.3 C), temperature source Oral, resp. rate 16, SpO2 98 %. Physical Exam  Constitutional: She is oriented to person, place, and time. She appears well-developed and well-nourished. She appears distressed (mildly distressed).  HENT:  Head: Normocephalic and atraumatic.  Eyes: Pupils are equal, round, and reactive to light. No scleral icterus.  Neck: Neck supple.  Cardiovascular: Normal rate and intact distal pulses.  Respiratory: Effort normal. No respiratory distress.  GI: Soft. She exhibits no distension. There is tenderness (lower incision red wtih fullness and tenderness.).  Musculoskeletal: Normal range of motion.  Neurological: She is alert and oriented to person, place, and time.  Skin: Skin is warm and dry. No rash noted. She is not diaphoretic. There is erythema.   Psychiatric: She has a normal mood and affect. Her behavior is normal. Judgment and thought content normal.    Assessment/Plan: Wound infection  Opened wound.  Copious purulent drainage was found with  foul odor.   Wound culture. Needs packing daily  Will follow.     Follow up with Dr. Marcello Moores once an outpatient.    Stark Klein 06/22/2017, 4:30 PM

## 2017-06-22 NOTE — ED Notes (Signed)
Bed: CY28 Expected date:  Expected time:  Means of arrival:  Comments: Jerline Pain

## 2017-06-22 NOTE — Telephone Encounter (Signed)
Emily Sanchez, Please inform the patient that her labs show elevated WBC. I was thinking yesterday if we can observe her sx's longer or not. I decided to order a CT scan of the abdomen - we will sch it for today. Thanks, AP

## 2017-06-22 NOTE — Progress Notes (Signed)
Pharmacy Antibiotic Note  Emily Sanchez is a 62 y.o. female admitted on 06/22/2017 with Abdominal wall abscess.  Pharmacy has been consulted for vancomycin and zosyn dosing.   s/p ex lap and repair of parastomal hernia 06/11/17 for small bowel obstruction, open wound w/ copious purulent drainage. S/p I&D 3/5.    Plan: Zosyn 3.375g IV q8h (4 hour infusion).  Vancomycin 2 gm IV loading dose then vancomycin 1 gm IV q24 for AUC 476 using SCr 1.12 (was 1.15 then 1 on admission, past SCr fluctuate) Daily SCr F/u renal function, WBC, temp, culture data Vancomycin levels as needed   Temp (24hrs), Avg:99.1 F (37.3 C), Min:98.9 F (37.2 C), Max:99.2 F (37.3 C)  Recent Labs  Lab 06/16/17 0338 06/21/17 1530 06/22/17 1545 06/22/17 1613  WBC 12.3* 18.3 Repeated and verified X2.* 21.1*  --   CREATININE 1.16* 0.97 1.15* 1.00  LATICACIDVEN  --   --   --  0.98    Estimated Creatinine Clearance: 68.8 mL/min (by C-G formula based on SCr of 1 mg/dL).    Allergies  Allergen Reactions  . Amphetamine-Dextroamphet Er Nausea And Vomiting  . Cymbalta [Duloxetine Hcl] Other (See Comments)    "Made me sleepy."  . Erythromycin Ethylsuccinate Nausea And Vomiting  . Morphine Nausea And Vomiting    Antimicrobials this admission: 3/5 vanc>> 3/5 zosyn>> Dose adjustments this admission:  Microbiology results: 3/5 BCx2>>  Thank you for allowing pharmacy to be a part of this patient's care.  Eudelia Bunch, Pharm.D. [phone] 06/22/2017 7:04 PM

## 2017-06-22 NOTE — ED Triage Notes (Signed)
Pt sent here post CT for "fluid needing to come off;" recent SBO.

## 2017-06-22 NOTE — Telephone Encounter (Signed)
Pt.notified

## 2017-06-22 NOTE — Progress Notes (Signed)
A consult was received from an ED physician for vancomycin and zosyn per pharmacy dosing.  The patient's profile has been reviewed for ht/wt/allergies/indication/available labs.   A one time order has been placed for vancomycin 2 gm and zosyn 3.375 gm.    Further antibiotics/pharmacy consults should be ordered by admitting physician if indicated.                       Thank you, Eudelia Bunch, Pharm.D. [phone] 06/22/2017 4:34 PM

## 2017-06-23 LAB — CBC
HCT: 32.9 % — ABNORMAL LOW (ref 36.0–46.0)
Hemoglobin: 10.6 g/dL — ABNORMAL LOW (ref 12.0–15.0)
MCH: 30.4 pg (ref 26.0–34.0)
MCHC: 32.2 g/dL (ref 30.0–36.0)
MCV: 94.3 fL (ref 78.0–100.0)
Platelets: 471 10*3/uL — ABNORMAL HIGH (ref 150–400)
RBC: 3.49 MIL/uL — ABNORMAL LOW (ref 3.87–5.11)
RDW: 14.4 % (ref 11.5–15.5)
WBC: 15 10*3/uL — ABNORMAL HIGH (ref 4.0–10.5)

## 2017-06-23 LAB — BASIC METABOLIC PANEL WITH GFR
Anion gap: 16 — ABNORMAL HIGH (ref 5–15)
Chloride: 103 mmol/L (ref 101–111)
Glucose, Bld: 79 mg/dL (ref 65–99)

## 2017-06-23 LAB — BASIC METABOLIC PANEL
BUN: 11 mg/dL (ref 6–20)
CO2: 19 mmol/L — ABNORMAL LOW (ref 22–32)
Calcium: 9.3 mg/dL (ref 8.9–10.3)
Creatinine, Ser: 1.27 mg/dL — ABNORMAL HIGH (ref 0.44–1.00)
GFR calc Af Amer: 52 mL/min — ABNORMAL LOW (ref >60)
GFR calc non Af Amer: 45 mL/min — ABNORMAL LOW (ref >60)
Potassium: 3.9 mmol/L (ref 3.5–5.1)
Sodium: 138 mmol/L (ref 135–145)

## 2017-06-23 NOTE — ED Notes (Signed)
Please premedicate pt with IV hydrocodone and PO vicodine prior to dressing change, per pt's request.

## 2017-06-23 NOTE — Progress Notes (Signed)
[facility] Progress Note     Subjective: CC- sore Patient states that she is still having some abdominal discomfort, but it is better than yesterday. Tolerated toast and bacon for breakfast.  WBC trending down 15, TMAX 99.2  Objective: Vital signs in last 24 hours: Temp:  [98.9 F (37.2 C)-99.2 F (37.3 C)] 98.9 F (37.2 C) (03/05 1757) Pulse Rate:  [95-113] 95 (03/06 0632) Resp:  [16-23] 17 (03/06 2440) BP: (101-154)/(66-85) 101/75 (03/06 0632) SpO2:  [90 %-100 %] 96 % (03/06 1027)    Intake/Output from previous day: 03/05 0701 - 03/06 0700 In: 1550 [IV Piggyback:1550] Out: -  Intake/Output this shift: No intake/output data recorded.  PE: Gen:  Alert, NAD HEENT: EOM's intact, pupils equal and round Pulm:  effort normal Abd: Soft, ND, +BS, no HSM, no hernia, open midline incision with trace purulent drainage at base, surrounding erythema improving Psych: A&Ox3  Skin: no rashes noted, warm and dry  Lab Results:  Recent Labs    06/22/17 1545 06/22/17 1613 06/23/17 0528  WBC 21.1*  --  15.0*  HGB 11.3* 11.9* 10.6*  HCT 33.8* 35.0* 32.9*  PLT 473*  --  471*   BMET Recent Labs    06/22/17 1545 06/22/17 1613 06/23/17 0528  NA 134* 134* 138  K 3.8 3.7 3.9  CL 97* 97* 103  CO2 20*  --  19*  GLUCOSE 73 70 79  BUN 11 8 11   CREATININE 1.15* 1.00 1.27*  CALCIUM 9.3  --  9.3   PT/INR No results for input(s): LABPROT, INR in the last 72 hours. CMP     Component Value Date/Time   NA 138 06/23/2017 0528   K 3.9 06/23/2017 0528   CL 103 06/23/2017 0528   CO2 19 (L) 06/23/2017 0528   GLUCOSE 79 06/23/2017 0528   BUN 11 06/23/2017 0528   CREATININE 1.27 (H) 06/23/2017 0528   CREATININE 1.18 (H) 12/05/2013 0854   CALCIUM 9.3 06/23/2017 0528   PROT 8.0 06/22/2017 1545   ALBUMIN 3.0 (L) 06/22/2017 1545   AST 18 06/22/2017 1545   ALT 17 06/22/2017 1545   ALKPHOS 115 06/22/2017 1545   BILITOT 1.3 (H) 06/22/2017 1545   GFRNONAA 45 (L)  06/23/2017 0528   GFRAA 52 (L) 06/23/2017 0528   Lipase     Component Value Date/Time   LIPASE 91 (H) 06/22/2017 1545       Studies/Results: Ct Abdomen Pelvis W Contrast  Result Date: 06/22/2017 CLINICAL DATA:  62 y/o F; recent bowel surgery 06/11/2017 with removal of scar tissue and mass around ileostomy. History of Crohn's colitis. Nausea, low-grade fever, and leukocytosis. EXAM: CT ABDOMEN AND PELVIS WITH CONTRAST TECHNIQUE: Multidetector CT imaging of the abdomen and pelvis was performed using the standard protocol following bolus administration of intravenous contrast. CONTRAST:  189m ISOVUE-300 IOPAMIDOL (ISOVUE-300) INJECTION 61% COMPARISON:  06/08/2017 CT abdomen and pelvis. FINDINGS: Lower chest: 3 mm left lower lobe perifissural nodule, likely intrapulmonary lymph node, is stable. Hepatobiliary: Stable subcentimeter lucencies in the liver, likely cysts. Decompressed gallbladder. No biliary dilatation. Pancreas: Unremarkable. No pancreatic ductal dilatation or surrounding inflammatory changes. Spleen: Normal in size without focal abnormality. Adrenals/Urinary Tract: Adrenal glands are unremarkable. Multiple cortical scars in the kidneys bilaterally. Kidneys are otherwise normal, without renal calculi, focal lesion, or hydronephrosis. Bladder is unremarkable. Stomach/Bowel: Partial gastrectomy. Mild wall thickening of small bowel in the mid abdomen with interval decompression. Colectomy. Vascular/Lymphatic: Aortic atherosclerosis. No enlarged abdominal or pelvic lymph nodes. Reproductive: Stable  uterine masses, likely myoma measuring up to 5.3 cm. Other: Midline anterior abdominal wall fluid collection centered in subcutaneous fat with small intraperitoneal component measuring 8.8 x 6.5 x 15.9 cm (AP x ML x CC series 2, image 58 and series 7, image 72). Collection in the right lower quadrant ileostomy measuring 1.8 x 10.5 x 7.8 cm (series 6, image 26 and series 7, image 37) centered in  subcutaneous fat. Fluid collections demonstrate mild peripheral enhancement. Mild edema in the mesenteric. Musculoskeletal: No fracture is seen. Grade 1 L4-5 anterolisthesis and lower lumbar facet hypertrophy. IMPRESSION: 1. Large fluid collections centered in subcutaneous fat of the midline anterior abdominal wall and surrounding the ileostomy with mild rim enhancement which may represent infection. 2. Mild wall thickening of small bowel in the mid abdomen. Mild mesenteric edema. Inflammation may be due to recent surgery, history of Crohn's, or infection. Electronically Signed   By: Kristine Garbe M.D.   On: 06/22/2017 14:09    Anti-infectives: Anti-infectives (From admission, onward)   Start     Dose/Rate Route Frequency Ordered Stop   06/23/17 1800  vancomycin (VANCOCIN) IVPB 1000 mg/200 mL premix     1,000 mg 200 mL/hr over 60 Minutes Intravenous Every 24 hours 06/22/17 1905     06/23/17 0000  piperacillin-tazobactam (ZOSYN) IVPB 3.375 g  Status:  Discontinued     3.375 g 12.5 mL/hr over 240 Minutes Intravenous Every 6 hours 06/22/17 2210 06/23/17 0335   06/23/17 0000  piperacillin-tazobactam (ZOSYN) IVPB 3.375 g     3.375 g 12.5 mL/hr over 240 Minutes Intravenous Every 8 hours 06/22/17 1905     06/22/17 2215  vancomycin (VANCOCIN) IVPB 1000 mg/200 mL premix     1,000 mg 200 mL/hr over 60 Minutes Intravenous Every 24 hours 06/22/17 2210     06/22/17 1700  vancomycin (VANCOCIN) 2,000 mg in sodium chloride 0.9 % 500 mL IVPB     2,000 mg 250 mL/hr over 120 Minutes Intravenous  Once 06/22/17 1623 06/22/17 2028   06/22/17 1630  piperacillin-tazobactam (ZOSYN) IVPB 3.375 g     3.375 g 100 mL/hr over 30 Minutes Intravenous  Once 06/22/17 1616 06/22/17 1832   06/22/17 1630  vancomycin (VANCOCIN) IVPB 1000 mg/200 mL premix  Status:  Discontinued     1,000 mg 200 mL/hr over 60 Minutes Intravenous  Once 06/22/17 1616 06/22/17 1623       Assessment/Plan HTN S/p gastric  sleeve Crohn's/ulcerative colitis - not on medication GERD  Wound infection -S/p EXPLORATORY LAPAROTOMY, LYSIS OF ADHESIONS, REPAIR OF PARASTOMAL HERNIA 2/22 Dr. Marcello Moores - wound opened 3/5, gram stain growing gram negative rods and positive cocci, culture pending - BID wet to dry dressing changes  ID - zosyn/vancomycin 3/5>> FEN - regular diet VTE - SCDs Foley - none  Plan - Dressing changed, continue BID wet to dry dressing changes. Continue antibiotics and follow culture. May be able to apply wound vac soon.   LOS: 1 day    Wellington Hampshire , [facility] 06/23/2017, 10:44 AM Pager: [phone] Consults: [phone] Mon-Fri 7:00 am-4:30 pm Sat-Sun 7:00 am-11:30 am

## 2017-06-23 NOTE — ED Notes (Signed)
ED TO INPATIENT HANDOFF REPORT  Name/Age/Gender Emily Sanchez 62 y.o. female  Code Status    Code Status Orders  (From admission, onward)        Start     Ordered   06/22/17 2210  Full code  Continuous     06/22/17 2210    Code Status History    Date Active Date Inactive Code Status Order ID Comments User Context   06/08/2017 07:45 06/16/2017 19:22 Full Code [id number]  Damita Lack, MD ED   08/31/2015 04:05 09/04/2015 19:07 Full Code [id number]  Judeth Horn, MD ED   04/02/2015 20:30 04/08/2015 15:12 Full Code [id number]  Johnathan Hausen, MD Inpatient   08/16/2012 04:54 08/18/2012 16:22 Full Code [id number]  Orvan Falconer, MD Inpatient      Home/SNF/Other Home  Chief Complaint R O abdominal abscess  Level of Care/Admitting Diagnosis ED Disposition    ED Disposition Condition [facility]: [facility] [[id number]]  Level of Care: Med-Surg [16]  Diagnosis: Surgical site infection [[id number]]  Admitting Physician: Cristy Folks [[id number]]  Attending Physician: Cristy Folks [[id number]]  Estimated length of stay: 3 - 4 days  Certification:: I certify this patient will need inpatient services for at least 2 midnights  PT Class (Do Not Modify): Inpatient [101]  PT Acc Code (Do Not Modify): Private [1]       Medical History Past Medical History:  Diagnosis Date  . Anxiety   . Chronic kidney disease    stage 3   . Crohn's disease ([place])   . Depression   . Enteritis due to Clostridium difficile 08/16/2012  . 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 GERD (gastroesophageal reflux disease)   . HTN (hypertension)   . Ileostomy in place [facility])   . Low back pain    FMS  . Osteoarthritis    Dr Alvan Dame; both knees  . Perianal pain    Chronic, Post-Op  . Transfusion history    none recent  . Ulcerative colitis ([place])    Dr Sharlett Iles    Allergies Allergies  Allergen Reactions  . Amphetamine-Dextroamphet Er Nausea And Vomiting  . Cymbalta  [Duloxetine Hcl] Other (See Comments)    "Made me sleepy."  . Erythromycin Ethylsuccinate Nausea And Vomiting  . Morphine Nausea And Vomiting    IV Location/Drains/Wounds Patient Lines/Drains/Airways Status   Active Line/Drains/Airways    Name:   Placement date:   Placement time:   Site:   Days:   Peripheral IV 06/22/17 Left Antecubital   06/22/17    1342    Antecubital   1   Peripheral IV 06/23/17 Left Hand   06/23/17    0610    Hand   less than 1   Colostomy RLQ   04/02/15    -    RLQ   813   Incision (Closed) 06/11/17 Abdomen   06/11/17    1151     12          Labs/Imaging Results for orders placed or performed during the hospital encounter of 06/22/17 (from the past 48 hour(s))  CBC with Differential     Status: Abnormal   Collection Time: 06/22/17  3:45 PM  Result Value Ref Range   WBC 21.1 (H) 4.0 - 10.5 K/uL   RBC 3.66 (L) 3.87 - 5.11 MIL/uL   Hemoglobin 11.3 (L) 12.0 - 15.0 g/dL   HCT 33.8 (L) 36.0 - 46.0 %   MCV 92.3 78.0 -  100.0 fL   MCH 30.9 26.0 - 34.0 pg   MCHC 33.4 30.0 - 36.0 g/dL   RDW 14.1 11.5 - 15.5 %   Platelets 473 (H) 150 - 400 K/uL   Neutrophils Relative % 85 %   Neutro Abs 17.9 (H) 1.7 - 7.7 K/uL   Lymphocytes Relative 9 %   Lymphs Abs 2.0 0.7 - 4.0 K/uL   Monocytes Relative 6 %   Monocytes Absolute 1.2 (H) 0.1 - 1.0 K/uL   Eosinophils Relative 0 %   Eosinophils Absolute 0.0 0.0 - 0.7 K/uL   Basophils Relative 0 %   Basophils Absolute 0.0 0.0 - 0.1 K/uL    Comment: Performed at [facility], [place] [address] [place], [place] [address]  Comprehensive metabolic panel     Status: Abnormal   Collection Time: 06/22/17  3:45 PM  Result Value Ref Range   Sodium 134 (L) 135 - 145 mmol/L   Potassium 3.8 3.5 - 5.1 mmol/L   Chloride 97 (L) 101 - 111 mmol/L   CO2 20 (L) 22 - 32 mmol/L   Glucose, Bld 73 65 - 99 mg/dL   BUN 11 6 - 20 mg/dL   Creatinine, Ser 1.15 (H) 0.44 - 1.00 mg/dL   Calcium 9.3 8.9 - 10.3 mg/dL   Total Protein 8.0  6.5 - 8.1 g/dL   Albumin 3.0 (L) 3.5 - 5.0 g/dL   AST 18 15 - 41 U/L   ALT 17 14 - 54 U/L   Alkaline Phosphatase 115 38 - 126 U/L   Total Bilirubin 1.3 (H) 0.3 - 1.2 mg/dL   GFR calc non Af Amer 50 (L) >60 mL/min   GFR calc Af Amer 58 (L) >60 mL/min    Comment: (NOTE) The eGFR has been calculated using the CKD EPI equation. This calculation has not been validated in all clinical situations. eGFR's persistently <60 mL/min signify possible Chronic Kidney Disease.    Anion gap 17 (H) 5 - 15    Comment: Performed at [facility], [place] [address], [place], [place] [address]  Lipase, blood     Status: Abnormal   Collection Time: 06/22/17  3:45 PM  Result Value Ref Range   Lipase 91 (H) 11 - 51 U/L    Comment: Performed at [facility], [place] [address], [place], [place] [address]  Urinalysis, Routine w reflex microscopic     Status: Abnormal   Collection Time: 06/22/17  3:45 PM  Result Value Ref Range   Color, Urine YELLOW YELLOW   APPearance CLEAR CLEAR   Specific Gravity, Urine >1.046 (H) 1.005 - 1.030   pH 5.0 5.0 - 8.0   Glucose, UA NEGATIVE NEGATIVE mg/dL   Hgb urine dipstick SMALL (A) NEGATIVE   Bilirubin Urine NEGATIVE NEGATIVE   Ketones, ur 80 (A) NEGATIVE mg/dL   Protein, ur 100 (A) NEGATIVE mg/dL   Nitrite NEGATIVE NEGATIVE   Leukocytes, UA NEGATIVE NEGATIVE   RBC / HPF 0-5 0 - 5 RBC/hpf   WBC, UA 0-5 0 - 5 WBC/hpf   Bacteria, UA NONE SEEN NONE SEEN   Squamous Epithelial / LPF 0-5 (A) NONE SEEN    Comment: Performed at [facility], [place] [address], [place], [place] [address]  I-Stat Chem 8, ED     Status: Abnormal   Collection Time: 06/22/17  4:13 PM  Result Value Ref Range   Sodium 134 (L) 135 - 145 mmol/L   Potassium 3.7 3.5 -  5.1 mmol/L   Chloride 97 (L) 101 - 111 mmol/L   BUN 8 6 - 20 mg/dL   Creatinine, Ser 1.00 0.44 - 1.00 mg/dL   Glucose, Bld 70 65 - 99 mg/dL   Calcium, Ion 1.22 1.15 - 1.40 mmol/L    TCO2 21 (L) 22 - 32 mmol/L   Hemoglobin 11.9 (L) 12.0 - 15.0 g/dL   HCT 35.0 (L) 36.0 - 46.0 %  I-Stat CG4 Lactic Acid, ED     Status: None   Collection Time: 06/22/17  4:13 PM  Result Value Ref Range   Lactic Acid, Venous 0.98 0.5 - 1.9 mmol/L  Blood culture (routine x 2)     Status: None (Preliminary result)   Collection Time: 06/22/17  4:35 PM  Result Value Ref Range   Specimen Description BLOOD LEFT ANTECUBITAL    Special Requests      BOTTLES DRAWN AEROBIC AND ANAEROBIC Blood Culture adequate volume   Culture PENDING    Report Status PENDING   Aerobic/Anaerobic Culture (surgical/deep wound)     Status: None (Preliminary result)   Collection Time: 06/22/17  5:08 PM  Result Value Ref Range   Specimen Description      ABDOMEN Performed at [facility], [place] [address] [place], [place] [address]    Special Requests      NONE Performed at [facility], [place] [address], [place], [place] [address]    Gram Stain      ABUNDANT WBC PRESENT, PREDOMINANTLY PMN ABUNDANT GRAM NEGATIVE RODS MODERATE GRAM POSITIVE COCCI Performed at [facility], [place] [address], [place], [place] [address]    Culture PENDING    Report Status PENDING   Basic metabolic panel     Status: Abnormal   Collection Time: 06/23/17  5:28 AM  Result Value Ref Range   Sodium 138 135 - 145 mmol/L   Potassium 3.9 3.5 - 5.1 mmol/L   Chloride 103 101 - 111 mmol/L   CO2 19 (L) 22 - 32 mmol/L   Glucose, Bld 79 65 - 99 mg/dL   BUN 11 6 - 20 mg/dL   Creatinine, Ser 1.27 (H) 0.44 - 1.00 mg/dL   Calcium 9.3 8.9 - 10.3 mg/dL   GFR calc non Af Amer 45 (L) >60 mL/min   GFR calc Af Amer 52 (L) >60 mL/min    Comment: (NOTE) The eGFR has been calculated using the CKD EPI equation. This calculation has not been validated in all clinical situations. eGFR's persistently <60 mL/min signify possible Chronic Kidney Disease.    Anion gap 16 (H) 5 - 15    Comment: Performed at [facility], [place] [address] [place], [place] [address]  CBC     Status: Abnormal   Collection Time: 06/23/17  5:28 AM  Result Value Ref Range   WBC 15.0 (H) 4.0 - 10.5 K/uL   RBC 3.49 (L) 3.87 - 5.11 MIL/uL   Hemoglobin 10.6 (L) 12.0 - 15.0 g/dL   HCT 32.9 (L) 36.0 - 46.0 %   MCV 94.3 78.0 - 100.0 fL   MCH 30.4 26.0 - 34.0 pg   MCHC 32.2 30.0 - 36.0 g/dL   RDW 14.4 11.5 - 15.5 %   Platelets 471 (H) 150 - 400 K/uL    Comment: Performed at [facility], [place] [address], [place], [place] [address]   Ct Abdomen Pelvis W Contrast  Result Date: 06/22/2017 CLINICAL DATA:  62 y/o F;  recent bowel surgery 06/11/2017 with removal of scar tissue and mass around ileostomy. History of Crohn's colitis. Nausea, low-grade fever, and leukocytosis. EXAM: CT ABDOMEN AND PELVIS WITH CONTRAST TECHNIQUE: Multidetector CT imaging of the abdomen and pelvis was performed using the standard protocol following bolus administration of intravenous contrast. CONTRAST:  182m ISOVUE-300 IOPAMIDOL (ISOVUE-300) INJECTION 61% COMPARISON:  06/08/2017 CT abdomen and pelvis. FINDINGS: Lower chest: 3 mm left lower lobe perifissural nodule, likely intrapulmonary lymph node, is stable. Hepatobiliary: Stable subcentimeter lucencies in the liver, likely cysts. Decompressed gallbladder. No biliary dilatation. Pancreas: Unremarkable. No pancreatic ductal dilatation or surrounding inflammatory changes. Spleen: Normal in size without focal abnormality. Adrenals/Urinary Tract: Adrenal glands are unremarkable. Multiple cortical scars in the kidneys bilaterally. Kidneys are otherwise normal, without renal calculi, focal lesion, or hydronephrosis. Bladder is unremarkable. Stomach/Bowel: Partial gastrectomy. Mild wall thickening of small bowel in the mid abdomen with interval decompression. Colectomy. Vascular/Lymphatic: Aortic atherosclerosis. No enlarged abdominal or pelvic lymph nodes. Reproductive: Stable uterine  masses, likely myoma measuring up to 5.3 cm. Other: Midline anterior abdominal wall fluid collection centered in subcutaneous fat with small intraperitoneal component measuring 8.8 x 6.5 x 15.9 cm (AP x ML x CC series 2, image 58 and series 7, image 72). Collection in the right lower quadrant ileostomy measuring 1.8 x 10.5 x 7.8 cm (series 6, image 26 and series 7, image 37) centered in subcutaneous fat. Fluid collections demonstrate mild peripheral enhancement. Mild edema in the mesenteric. Musculoskeletal: No fracture is seen. Grade 1 L4-5 anterolisthesis and lower lumbar facet hypertrophy. IMPRESSION: 1. Large fluid collections centered in subcutaneous fat of the midline anterior abdominal wall and surrounding the ileostomy with mild rim enhancement which may represent infection. 2. Mild wall thickening of small bowel in the mid abdomen. Mild mesenteric edema. Inflammation may be due to recent surgery, history of Crohn's, or infection. Electronically Signed   By: LKristine GarbeM.D.   On: 06/22/2017 14:09    Pending Labs Unresulted Labs (From admission, onward)   Start     Ordered   06/23/17 0500  Creatinine, serum  Daily,   R    Comments:  While on vanc/zosyn    06/22/17 1902   06/22/17 1745  Culture, blood (Routine X 2) w Reflex to ID Panel  Once,   R     06/22/17 1745      Vitals/Pain Today's Vitals   06/23/17 0422 06/23/17 0632 06/23/17 1045 06/23/17 1056  BP: 105/69 101/75  (!) 145/91  Pulse: 96 95  94  Resp: 16 17  18   Temp:    98.6 F (37 C)  TempSrc:      SpO2: 95% 96%  97%  PainSc:   9      Isolation Precautions No active isolations  Medications Medications  cholecalciferol (VITAMIN D) tablet 1,000 Units (1,000 Units Oral Given 06/23/17 1041)  cyanocobalamin ((VITAMIN B-12)) injection 1,000 mcg (1,000 mcg Intramuscular Given 06/23/17 1042)  multivitamin with minerals tablet 1 tablet (1 tablet Oral Given 06/23/17 1041)  pantoprazole (PROTONIX) EC tablet 40 mg (40 mg  Oral Given 06/23/17 1041)  0.9 %  sodium chloride infusion ( Intravenous New Bag/Given 06/22/17 2334)  HYDROcodone-acetaminophen (NORCO/VICODIN) 5-325 MG per tablet 1-2 tablet (2 tablets Oral Given 06/23/17 1052)  ondansetron (ZOFRAN) injection 4 mg (not administered)  vancomycin (VANCOCIN) IVPB 1000 mg/200 mL premix (not administered)  HYDROmorphone (DILAUDID) injection 0.5 mg (0.5 mg Intravenous Given 06/23/17 1041)  vancomycin (VANCOCIN) IVPB 1000 mg/200 mL premix (not administered)  piperacillin-tazobactam (ZOSYN) IVPB 3.375 g (0 g Intravenous Stopped 06/23/17 1207)  HYDROmorphone (DILAUDID) injection 0.5 mg (1 mg Intravenous Given 06/22/17 1629)  ondansetron (ZOFRAN) injection 4 mg (4 mg Intravenous Given 06/22/17 1630)  sodium chloride 0.9 % bolus 1,000 mL (0 mLs Intravenous Stopped 06/22/17 1749)  piperacillin-tazobactam (ZOSYN) IVPB 3.375 g (0 g Intravenous Stopped 06/22/17 1832)  lidocaine (PF) (XYLOCAINE) 1 % injection 30 mL (30 mLs Other Given by Other 06/22/17 1638)  vancomycin (VANCOCIN) 2,000 mg in sodium chloride 0.9 % 500 mL IVPB (0 mg Intravenous Stopped 06/22/17 2028)    Mobility walks

## 2017-06-23 NOTE — ED Notes (Signed)
Breakfast given

## 2017-06-23 NOTE — Progress Notes (Signed)
PROGRESS NOTE  Emily Sanchez EYC:[id number] DOB: Mar 05, 1956 DOA: 06/22/2017 PCP: Cassandria Anger, MD  HPI/Recap of past 24 hours:  Emily Sanchez is a 62 y.o. year old female with medical history significant for UC/Crohn's, HTN, and recent admission on2/19-2 for SBO /27 requiring exploratory laparotomy, lysis of adhesions, and repair of parastomal hernia who presented on 06/22/2017 with worsening abdominal pain and was found to have surgical site infection with abdominal wall abscess.   Interval History Pain improved, denies fevers or chills. Normal ostomy output  Assessment/Plan: Principal Problem:   Surgical site infection Active Problems:   B12 deficiency   Crohn's disease ([place])   Status post total colectomy and ileostomy for Crohn's   S/P laparoscopic sleeve gastrectomy Dec 2016   #Abdominal wall abscess, surgical site infection After recent exploratory laparotomy, lysis of adhesions and repair of parastomal hernia on 06/11/17 for SBO S/p I&D on 3/5 -Empiric vancomycin and Zosyn  -f/u debridement cultures -Daily packing, surgery following -Norco every 4 moderate pain, IV Dilaudid severe pain q 4 h -Zofran as needed  #Ulcerative colitis/Crohn's disease status post total colectomy in 2001, gastric sleeve in 2016, stable - vit D and B12 supplementation  #Hypertension -HCTZ held after recent admission - monitor  #GERD -protonix   Code Status: Full Code   Family Communication: family at bedside   Disposition Plan: f/u cultures, continue empiric abx   Consultants:  Surgery  Procedures:  Bedside surgical debridement on 3/  Antimicrobials:  3/5: vancomycin  3/5 : zosyn   Cultures:  Bedside debridement cultures: 3/5: GNR, GPC  Telemetry:None  DVT prophylaxis: SCDs   Objective: Vitals:   06/23/17 0422 06/23/17 0632 06/23/17 1056 06/23/17 1419  BP: 105/69 101/75 (!) 145/91 133/77  Pulse: 96 95 94 92  Resp: 16 17 18 18   Temp:   98.6 F (37 C)  97.7 F (36.5 C)  TempSrc:    Oral  SpO2: 95% 96% 97% 98%    Intake/Output Summary (Last 24 hours) at 06/23/2017 1459 Last data filed at 06/22/2017 2028 Gross per 24 hour  Intake 1550 ml  Output -  Net 1550 ml   There were no vitals filed for this visit.  Exam:  General: Comfortably lying in bed Eyes: EOMI, anicteric ENT: Oral Mucosa clear and moist Neck: normal, no thyromegaly Cardiovascular: regular rate and rhythm, no murmurs, rubs or gallops, no JVD or edema Respiratory: Normal respiratory effort on room air, lungs clear to auscultation bilaterally Abdomen: soft,ostomy bag in place, dressing on LLQ saturated with blood ( prior to bedside re-packing/change by surgery) Skin: No Rash Musculoskeletal:Good ROM,Normal muscle tone Neurologic: Grossly no focal neuro deficit.Mental status AAOx3 Psychiatric:Appropriate affect, and mood  Data Reviewed: CBC: Recent Labs  Lab 06/21/17 1530 06/22/17 1545 06/22/17 1613 06/23/17 0528  WBC 18.3 Repeated and verified X2.* 21.1*  --  15.0*  NEUTROABS 14.6* 17.9*  --   --   HGB 11.6* 11.3* 11.9* 10.6*  HCT 34.8* 33.8* 35.0* 32.9*  MCV 91.4 92.3  --  94.3  PLT 431.0* 473*  --  149*   Basic Metabolic Panel: Recent Labs  Lab 06/21/17 1530 06/22/17 1545 06/22/17 1613 06/23/17 0528  NA 135 134* 134* 138  K 4.7 3.8 3.7 3.9  CL 97 97* 97* 103  CO2 26 20*  --  19*  GLUCOSE 72 73 70 79  BUN 13 11 8 11   CREATININE 0.97 1.15* 1.00 1.27*  CALCIUM 10.1 9.3  --  9.3   GFR: Estimated Creatinine  Clearance: 54.2 mL/min (A) (by C-G formula based on SCr of 1.27 mg/dL (H)). Liver Function Tests: Recent Labs  Lab 06/21/17 1530 06/22/17 1545  AST 12 18  ALT 14 17  ALKPHOS 108 115  BILITOT 0.4 1.3*  PROT 8.2 8.0  ALBUMIN 3.3* 3.0*   Recent Labs  Lab 06/22/17 1545  LIPASE 91*   No results for input(s): AMMONIA in the last 168 hours. Coagulation Profile: No results for input(s): INR, PROTIME in the last 168 hours. Cardiac  Enzymes: No results for input(s): CKTOTAL, CKMB, CKMBINDEX, TROPONINI in the last 168 hours. BNP (last 3 results) No results for input(s): PROBNP in the last 8760 hours. HbA1C: No results for input(s): HGBA1C in the last 72 hours. CBG: No results for input(s): GLUCAP in the last 168 hours. Lipid Profile: No results for input(s): CHOL, HDL, LDLCALC, TRIG, CHOLHDL, LDLDIRECT in the last 72 hours. Thyroid Function Tests: No results for input(s): TSH, T4TOTAL, FREET4, T3FREE, THYROIDAB in the last 72 hours. Anemia Panel: No results for input(s): VITAMINB12, FOLATE, FERRITIN, TIBC, IRON, RETICCTPCT in the last 72 hours. Urine analysis:    Component Value Date/Time   COLORURINE YELLOW 06/22/2017 1545   APPEARANCEUR CLEAR 06/22/2017 1545   LABSPEC >1.046 (H) 06/22/2017 1545   PHURINE 5.0 06/22/2017 1545   GLUCOSEU NEGATIVE 06/22/2017 1545   GLUCOSEU NEGATIVE 02/10/2013 0914   HGBUR SMALL (A) 06/22/2017 1545   BILIRUBINUR NEGATIVE 06/22/2017 1545   KETONESUR 80 (A) 06/22/2017 1545   PROTEINUR 100 (A) 06/22/2017 1545   UROBILINOGEN 0.2 02/10/2013 0914   NITRITE NEGATIVE 06/22/2017 1545   LEUKOCYTESUR NEGATIVE 06/22/2017 1545   Sepsis Labs: @LABRCNTIP (procalcitonin:4,lacticidven:4)  ) Recent Results (from the past 240 hour(s))  Blood culture (routine x 2)     Status: None (Preliminary result)   Collection Time: 06/22/17  4:35 PM  Result Value Ref Range Status   Specimen Description BLOOD LEFT ANTECUBITAL  Final   Special Requests   Final    BOTTLES DRAWN AEROBIC AND ANAEROBIC Blood Culture adequate volume   Culture   Final    NO GROWTH < 24 HOURS Performed at [facility], [place] [address], [place], [place] [address]    Report Status PENDING  Incomplete  Aerobic/Anaerobic Culture (surgical/deep wound)     Status: None (Preliminary result)   Collection Time: 06/22/17  5:08 PM  Result Value Ref Range Status   Specimen Description   Final    ABDOMEN Performed at [place] [address] [place], [place] [address]    Special Requests   Final    NONE Performed at [facility], [place] [address] [place], [place] [address]    Gram Stain   Final    ABUNDANT WBC PRESENT, PREDOMINANTLY PMN ABUNDANT GRAM NEGATIVE RODS MODERATE GRAM POSITIVE COCCI    Culture   Final    CULTURE REINCUBATED FOR BETTER GROWTH Performed at [facility], [place] [address] [place], [place] [address]    Report Status PENDING  Incomplete  Culture, blood (Routine X 2) w Reflex to ID Panel     Status: None (Preliminary result)   Collection Time: 06/22/17  5:43 PM  Result Value Ref Range Status   Specimen Description   Final    RIGHT ANTECUBITAL Performed at [place] [address], [place], [place] [address]    Special Requests   Final    IN PEDIATRIC BOTTLE Blood Culture adequate volume Performed at [facility], [place] [address], [place], [place] [address]    Culture   Final    NO GROWTH < 24 HOURS Performed at [place] [address], [place], [place] [address]    Report Status PENDING  Incomplete      Studies: No results found.  Scheduled Meds: . cholecalciferol  1,000 Units Oral Daily  . cyanocobalamin  1,000 mcg Intramuscular Q14 Days  . multivitamin with minerals  1 tablet Oral Daily  . pantoprazole  40 mg Oral Daily    Continuous Infusions: . sodium chloride 100 mL/hr at 06/22/17 2334  . piperacillin-tazobactam (ZOSYN)  IV Stopped (06/23/17 1207)  . vancomycin    . vancomycin       LOS: 1 day     Desiree Hane, MD [facility] Pager [phone]  If 7PM-7AM, please contact night-coverage [email or web address] Password [email or web address] 06/23/2017, 2:59 PM

## 2017-06-24 DIAGNOSIS — K50118 Crohn's disease of large intestine with other complication: Secondary | ICD-10-CM

## 2017-06-24 LAB — CREATININE, SERUM
Creatinine, Ser: 1.17 mg/dL — ABNORMAL HIGH (ref 0.44–1.00)
GFR calc Af Amer: 57 mL/min — ABNORMAL LOW (ref >60)
GFR calc non Af Amer: 49 mL/min — ABNORMAL LOW (ref >60)

## 2017-06-24 MED ORDER — JUVEN PO PACK
1.0000 | PACK | Freq: Two times a day (BID) | ORAL | Status: DC
  Administered 2017-06-25 – 2017-06-26 (×3): 1 via ORAL
  Filled 2017-06-24 (×5): qty 1

## 2017-06-24 MED ORDER — PREMIER PROTEIN SHAKE
11.0000 [oz_av] | Freq: Two times a day (BID) | ORAL | Status: DC
  Administered 2017-06-24 – 2017-06-26 (×4): 11 [oz_av] via ORAL
  Filled 2017-06-24 (×6): qty 325.31

## 2017-06-24 NOTE — Progress Notes (Addendum)
[facility] Progress Note     Subjective: CC-  Patient states that she feels better than yesterday. Continues to have some abdominal pain but it is improving. Tolerating dressing changes well. Tolerating diet. Denies n/v. States that she ambulated in the halls with her daughter yesterday.  Objective: Vital signs in last 24 hours: Temp:  [97.7 F (36.5 C)-98.6 F (37 C)] 98 F (36.7 C) (03/07 0606) Pulse Rate:  [89-101] 89 (03/07 0606) Resp:  [18] 18 (03/06 1419) BP: (129-145)/(77-91) 135/78 (03/07 0606) SpO2:  [97 %-100 %] 100 % (03/07 0606) Last BM Date: 06/23/17  Intake/Output from previous day: 03/06 0701 - 03/07 0700 In: 3261.7 [I.V.:2861.7; IV Piggyback:400] Out: -  Intake/Output this shift: No intake/output data recorded.  PE: Gen:  Alert, NAD HEENT: EOM's intact, pupils equal and round Pulm:  effort normal Abd: Soft, ND, +BS, no HSM, no hernia, open midline incision with trace purulent drainage at base, surrounding erythema improving Psych: A&Ox3  Skin: no rashes noted, warm and dry  Lab Results:  Recent Labs    06/22/17 1545 06/22/17 1613 06/23/17 0528  WBC 21.1*  --  15.0*  HGB 11.3* 11.9* 10.6*  HCT 33.8* 35.0* 32.9*  PLT 473*  --  471*   BMET Recent Labs    06/22/17 1545 06/22/17 1613 06/23/17 0528 06/24/17 0613  NA 134* 134* 138  --   K 3.8 3.7 3.9  --   CL 97* 97* 103  --   CO2 20*  --  19*  --   GLUCOSE 73 70 79  --   BUN 11 8 11   --   CREATININE 1.15* 1.00 1.27* 1.17*  CALCIUM 9.3  --  9.3  --    PT/INR No results for input(s): LABPROT, INR in the last 72 hours. CMP     Component Value Date/Time   NA 138 06/23/2017 0528   K 3.9 06/23/2017 0528   CL 103 06/23/2017 0528   CO2 19 (L) 06/23/2017 0528   GLUCOSE 79 06/23/2017 0528   BUN 11 06/23/2017 0528   CREATININE 1.17 (H) 06/24/2017 0613   CREATININE 1.18 (H) 12/05/2013 0854   CALCIUM 9.3 06/23/2017 0528   PROT 8.0 06/22/2017 1545   ALBUMIN 3.0 (L) 06/22/2017  1545   AST 18 06/22/2017 1545   ALT 17 06/22/2017 1545   ALKPHOS 115 06/22/2017 1545   BILITOT 1.3 (H) 06/22/2017 1545   GFRNONAA 49 (L) 06/24/2017 0613   GFRAA 57 (L) 06/24/2017 0613   Lipase     Component Value Date/Time   LIPASE 91 (H) 06/22/2017 1545       Studies/Results: Ct Abdomen Pelvis W Contrast  Result Date: 06/22/2017 CLINICAL DATA:  62 y/o F; recent bowel surgery 06/11/2017 with removal of scar tissue and mass around ileostomy. History of Crohn's colitis. Nausea, low-grade fever, and leukocytosis. EXAM: CT ABDOMEN AND PELVIS WITH CONTRAST TECHNIQUE: Multidetector CT imaging of the abdomen and pelvis was performed using the standard protocol following bolus administration of intravenous contrast. CONTRAST:  184m ISOVUE-300 IOPAMIDOL (ISOVUE-300) INJECTION 61% COMPARISON:  06/08/2017 CT abdomen and pelvis. FINDINGS: Lower chest: 3 mm left lower lobe perifissural nodule, likely intrapulmonary lymph node, is stable. Hepatobiliary: Stable subcentimeter lucencies in the liver, likely cysts. Decompressed gallbladder. No biliary dilatation. Pancreas: Unremarkable. No pancreatic ductal dilatation or surrounding inflammatory changes. Spleen: Normal in size without focal abnormality. Adrenals/Urinary Tract: Adrenal glands are unremarkable. Multiple cortical scars in the kidneys bilaterally. Kidneys are otherwise normal, without renal calculi, focal  lesion, or hydronephrosis. Bladder is unremarkable. Stomach/Bowel: Partial gastrectomy. Mild wall thickening of small bowel in the mid abdomen with interval decompression. Colectomy. Vascular/Lymphatic: Aortic atherosclerosis. No enlarged abdominal or pelvic lymph nodes. 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 Other: Midline anterior abdominal wall fluid collection centered in subcutaneous fat with small intraperitoneal component measuring 8.8 x 6.5 x 15.9 cm (AP x ML x CC series 2, image 58 and series 7, image 72).  Collection in the right lower quadrant ileostomy measuring 1.8 x 10.5 x 7.8 cm (series 6, image 26 and series 7, image 37) centered in subcutaneous fat. Fluid collections demonstrate mild peripheral enhancement. Mild edema in the mesenteric. Musculoskeletal: No fracture is seen. Grade 1 L4-5 anterolisthesis and lower lumbar facet hypertrophy. IMPRESSION: 1. Large fluid collections centered in subcutaneous fat of the midline anterior abdominal wall and surrounding the ileostomy with mild rim enhancement which may represent infection. 2. Mild wall thickening of small bowel in the mid abdomen. Mild mesenteric edema. Inflammation may be due to recent surgery, history of Crohn's, or infection. Electronically Signed   By: Kristine Garbe M.D.   On: 06/22/2017 14:09    Anti-infectives: Anti-infectives (From admission, onward)   Start     Dose/Rate Route Frequency Ordered Stop   06/23/17 1800  vancomycin (VANCOCIN) IVPB 1000 mg/200 mL premix     1,000 mg 200 mL/hr over 60 Minutes Intravenous Every 24 hours 06/22/17 1905     06/23/17 0000  piperacillin-tazobactam (ZOSYN) IVPB 3.375 g  Status:  Discontinued     3.375 g 12.5 mL/hr over 240 Minutes Intravenous Every 6 hours 06/22/17 2210 06/23/17 0335   06/23/17 0000  piperacillin-tazobactam (ZOSYN) IVPB 3.375 g     3.375 g 12.5 mL/hr over 240 Minutes Intravenous Every 8 hours 06/22/17 1905     06/22/17 2215  vancomycin (VANCOCIN) IVPB 1000 mg/200 mL premix  Status:  Discontinued     1,000 mg 200 mL/hr over 60 Minutes Intravenous Every 24 hours 06/22/17 2210 06/23/17 1519   06/22/17 1700  vancomycin (VANCOCIN) 2,000 mg in sodium chloride 0.9 % 500 mL IVPB     2,000 mg 250 mL/hr over 120 Minutes Intravenous  Once 06/22/17 1623 06/22/17 2028   06/22/17 1630  piperacillin-tazobactam (ZOSYN) IVPB 3.375 g     3.375 g 100 mL/hr over 30 Minutes Intravenous  Once 06/22/17 1616 06/22/17 1832   06/22/17 1630  vancomycin (VANCOCIN) IVPB 1000 mg/200 mL  premix  Status:  Discontinued     1,000 mg 200 mL/hr over 60 Minutes Intravenous  Once 06/22/17 1616 06/22/17 1623       Assessment/Plan HTN S/p gastric sleeve Crohn's/ulcerative colitis - not on medication GERD  Wound infection -S/p EXPLORATORY LAPAROTOMY, LYSIS OF ADHESIONS, REPAIR OF PARASTOMAL HERNIA 2/22 Dr. Marcello Moores - wound opened 3/5, gram stain growing gram negative rods and positive cocci, culture pending - BID wet to dry dressing changes  ID - zosyn/vancomycin 3/5>> FEN - regular diet VTE - SCDs, ok for chemical DVT prophylaxis from our standpoint Foley - none  Plan - Dressing changed; continues to have trace purulent drainage at base of wound so I recommend continuing BID wet to dry dressing changes; not ready for a wound vac. Discussed with RN doing PM dressing change with PO pain medication rather than IV. Encourage daily shower with wound open. Continue antibiotics and follow culture. Continue mobilizing.   LOS: 2 days    Wellington Hampshire , [facility] 06/24/2017,  8:49 AM Pager: [phone] Consults: [phone] Mon-Fri 7:00 am-4:30 pm Sat-Sun 7:00 am-11:30 am

## 2017-06-24 NOTE — Progress Notes (Addendum)
Attending MD note  Patient was seen, examined,treatment plan was discussed with the PA-S.  I have personally reviewed the clinical findings, lab, imaging studies and management of this patient in detail. I agree with the documentation, as recorded by the PA-S  Patient is a 62 year old female with medical history significant for UC/Crohn's disease, hypertension who presented to the emergency department complaining of abdominal pain around her surgical site.  She was recently discharged on 06/16/17  after being admitted for SBO that required ex lap with lysis of adhesions.  Upon initial evaluation CT of the abdomen and pelvis show large fluid collection centered in the subcutaneous fat of midline anterior abdominal wall concerning of abscess.  Abscess was drained at bedside and was found to have a copious foul-smelling purulent drainage.  Patient was admitted with working diagnosis of abdominal wall abscess around the surgical site with surrounding cellulitis.  Patient seen and examined, and she is doing much better.  Although continues to complain of abdominal pain.  Surgical team evaluated patient and considering wound VAC at some point.  On Exam: Blood pressure 121/74, pulse (!) 103, temperature 98.2 F (36.8 C), temperature source Oral, resp. rate 20, SpO2 97 %.  Gen. exam: Awake, alert, not in any distress Chest: Good air entry bilaterally, no rhonchi or rales CVS: S1-S2 regular, no murmurs Abdomen: Soft, nontender and nondistended Neurology: Non-focal Skin: No rash or lesions  Assessment/plan: Abdominal wall abscess with surrounding cellulitis at previous surgical site from ex lap Wound cultures showing multiple organisms, will continue Zosyn and vancomycin for now.  Surgery following. Considering wound VAC but wound not ready.  Continue twice daily dressing changes.  Continue pain management as needed.  Leukocytosis trending down  UC/Crohn's disease Stable, patient had total  colectomy in 2001 with colostomy bag. Continue to monitor  Hypertension BP stable Home medications were held and BP remained stable if BP start to increase, resume home meds.  For now continue to monitor  Acute kidney injury Prerenal, creatinine improving.  Can d/c IV fluids as patient is tolerating oral diet creatinine improving Encourage oral hydration Check BMP in a.m.  Rest as below  Chipper Oman, MD    Progress Note   Emily Sanchez MEQ:[id number] DOB: 25-Sep-1955 DOA: 06/22/2017 PCP: Cassandria Anger, MD   LOS: 2 days    Brief Narrative:  Emily Sanchez is a 62 year old female with a medical history significant for UC/Crohns and HTN who presented to the ED on 06/22/17 with worsening abdominal pain around her surgical site. She was previously admitted to the hospital on 2/19-2/27/19 for a SBO that required exploratory laparotomy, lysis of adhesions, and repair of parastomal hernia. Upon presentation to the ED, patient was afebrile with temperature of 99.2, BP 154/81, tachycardic 103. Initial labs demonstrated, sodium 134, Chloride 97, bicarb 20, Cr 1.15, anion gap 17, Lipase 91, WBC 21.1, RBC 3.66, Hgb 11.3, HCT 33.8, platelets 473, Neut# 17.9, Monocyte 1.2. CT of abdomen and pelvis demonstrated large fluid collection centered in subcutaneous fat of midline anterior abdominal wall & surrounding ileostomy with mild rim enhancement and mild wall thickening of small bowel. Abscess was drained at bedside and was found to have copious purulent drainage that was malodorous.   Patient was admitted to the hospital on the working diagnosis of abdominal wall abscess around surgical site with surrounding cellulitis complicated by sepsis.  Assessment/Plan:   Principal Problem:   Surgical site infection Active Problems:   B12 deficiency   Crohn's disease ([place])  Status post total colectomy and ileostomy for Crohn's   S/P laparoscopic sleeve gastrectomy Dec 2016  1. Abdominal surgical  site abscess with surrounding cellulitis: Wound culture gram stain was positive for gram negative rods and gram positive cocci. Blood cultures pending. Surgery came by today and changed dressing and still found trace purulent drainage at base of wound. Continue BID wet to dry dressing changes. Surgery did not feel she was ready for wound vac. Continue current abx of zosyn and vancomycin and change if needed based off culture results. Allow patient to have daily shower with wound open. Ambulate as tolerated. Continue Norco/Vicodin & dilaudid prn for pain.   2. Ulcerative colitis/Crohn's disease: Patient had a total colectomy in 2001 and gastric sleeve in 2016 which are stable. Continue vitamin D and B12 supplementation.   3. Hypertension: BP is controlled with SBP 120s-130s over the past 24 hours. Home medications held. Continue to monitor.   4. Leukocytosis: WBC is trending down and is now currently 15.0. Obtain CBC tomorrow am to monitor levels. Continue abx therapy as noted above.  5. Acute Kidney Injury: Likely due to decreased p.o intake. Creatinine levels have improve over the past 24 hours and is now 1.17. Continue IVFs. Obtain BMP tomorrow am to monitor renal function. Hold nephrotoxic drugs    Family Communication/Anticipated D/C date and plan/Code Status   DVT prophylaxis: SCDs Code Status: Full Family Communication: Family member at bedside Disposition Plan: Home   Medical Consultants:  Surgery   Antimicrobials:  Zosyn Vancomycin   Subjective:  Patient is awake and alert. She states her pain has improved, but she is still experiencing moderate abdominal pain around her surgical site, mostly during wound changes and directly after. Patient admits to nausea last night that has not fully subsided in which she was given Zofran. Patient denies chest pain, SOB, vomiting, or diarrhea.   Objective:   Vitals:   06/23/17 1056 06/23/17 1419 06/23/17 2104 06/24/17 0606  BP: (!)  145/91 133/77 129/80 135/78  Pulse: 94 92 (!) 101 89  Resp: 18 18    Temp: 98.6 F (37 C) 97.7 F (36.5 C) 98.2 F (36.8 C) 98 F (36.7 C)  TempSrc:  Oral Oral Oral  SpO2: 97% 98% 97% 100%    Intake/Output Summary (Last 24 hours) at 06/24/2017 3016 Last data filed at 06/24/2017 0411 Gross per 24 hour  Intake 3261.67 ml  Output -  Net 3261.67 ml   There were no vitals filed for this visit.   Physical Exam:   Constitutional: NAD Eyes: lids and conjunctivae normal ENMT: Mucous membranes are moist Neck: normal, supple, no masses Respiratory: clear to auscultation bilaterally, no wheezing, no crackles. Normal respiratory effort. No accessory muscle use.  Cardiovascular: Regular rate and rhythm, normal S1-S2, no murmurs / rubs / gallops. No LE edema.  Abdomen: soft, non-distended abdomen. Tenderness to palpation midline around surgical site and LLQ. Patient has a ileostomy bag in place with no signs of infection. Wound is still having mild purulent drainage per surgery.  Musculoskeletal: no clubbing / cyanosis. No joint deformity upper and lower extremities. Normal muscle tone.  Skin: As noted above Neurologic: non-focal Psychiatric: Alert and oriented x 3    Data Reviewed:   I have independently reviewed following labs and imaging studies:   CBC: Recent Labs  Lab 06-28-17 1530 06/22/17 1545 06/22/17 1613 06/23/17 0528  WBC 18.3 Repeated and verified X2.* 21.1*  --  15.0*  NEUTROABS 14.6* 17.9*  --   --  HGB 11.6* 11.3* 11.9* 10.6*  HCT 34.8* 33.8* 35.0* 32.9*  MCV 91.4 92.3  --  94.3  PLT 431.0* 473*  --  142*   Basic Metabolic Panel: Recent Labs  Lab 06/21/17 1530 06/22/17 1545 06/22/17 1613 06/23/17 0528 06/24/17 0613  NA 135 134* 134* 138  --   K 4.7 3.8 3.7 3.9  --   CL 97 97* 97* 103  --   CO2 26 20*  --  19*  --   GLUCOSE 72 73 70 79  --   BUN 13 11 8 11   --   CREATININE 0.97 1.15* 1.00 1.27* 1.17*  CALCIUM 10.1 9.3  --  9.3  --     GFR: Estimated Creatinine Clearance: 58.8 mL/min (A) (by C-G formula based on SCr of 1.17 mg/dL (H)). Liver Function Tests: Recent Labs  Lab 06/21/17 1530 06/22/17 1545  AST 12 18  ALT 14 17  ALKPHOS 108 115  BILITOT 0.4 1.3*  PROT 8.2 8.0  ALBUMIN 3.3* 3.0*   Recent Labs  Lab 06/22/17 1545  LIPASE 91*   No results for input(s): AMMONIA in the last 168 hours. Coagulation Profile: No results for input(s): INR, PROTIME in the last 168 hours. Cardiac Enzymes: No results for input(s): CKTOTAL, CKMB, CKMBINDEX, TROPONINI in the last 168 hours. BNP (last 3 results) No results for input(s): PROBNP in the last 8760 hours. HbA1C: No results for input(s): HGBA1C in the last 72 hours. CBG: No results for input(s): GLUCAP in the last 168 hours. Lipid Profile: No results for input(s): CHOL, HDL, LDLCALC, TRIG, CHOLHDL, LDLDIRECT in the last 72 hours. Thyroid Function Tests: No results for input(s): TSH, T4TOTAL, FREET4, T3FREE, THYROIDAB in the last 72 hours. Anemia Panel: No results for input(s): VITAMINB12, FOLATE, FERRITIN, TIBC, IRON, RETICCTPCT in the last 72 hours. Urine analysis:    Component Value Date/Time   COLORURINE YELLOW 06/22/2017 1545   APPEARANCEUR CLEAR 06/22/2017 1545   LABSPEC >1.046 (H) 06/22/2017 1545   PHURINE 5.0 06/22/2017 1545   GLUCOSEU NEGATIVE 06/22/2017 1545   GLUCOSEU NEGATIVE 02/10/2013 0914   HGBUR SMALL (A) 06/22/2017 1545   BILIRUBINUR NEGATIVE 06/22/2017 1545   KETONESUR 80 (A) 06/22/2017 1545   PROTEINUR 100 (A) 06/22/2017 1545   UROBILINOGEN 0.2 02/10/2013 0914   NITRITE NEGATIVE 06/22/2017 1545   LEUKOCYTESUR NEGATIVE 06/22/2017 1545   Sepsis Labs: Invalid input(s): PROCALCITONIN, LACTICIDVEN  Recent Results (from the past 240 hour(s))  Blood culture (routine x 2)     Status: None (Preliminary result)   Collection Time: 06/22/17  4:35 PM  Result Value Ref Range Status   Specimen Description BLOOD LEFT ANTECUBITAL  Final    Special Requests   Final    BOTTLES DRAWN AEROBIC AND ANAEROBIC Blood Culture adequate volume   Culture   Final    NO GROWTH < 24 HOURS Performed at [facility], [address], [place], [place] [address]    Report Status PENDING  Incomplete  Aerobic/Anaerobic Culture (surgical/deep wound)     Status: None (Preliminary result)   Collection Time: 06/22/17  5:08 PM  Result Value Ref Range Status   Specimen Description   Final    ABDOMEN Performed at [facility] [address] [place], [place] [address]    Special Requests   Final    NONE Performed at [facility], [place] [address], [place], [place] [address]    Gram Stain   Final    ABUNDANT WBC  PRESENT, PREDOMINANTLY PMN ABUNDANT GRAM NEGATIVE RODS MODERATE GRAM POSITIVE COCCI    Culture   Final    CULTURE REINCUBATED FOR BETTER GROWTH Performed at [facility], [place] [address] [place], [place] [address]    Report Status PENDING  Incomplete  Culture, blood (Routine X 2) w Reflex to ID Panel     Status: None (Preliminary result)   Collection Time: 06/22/17  5:43 PM  Result Value Ref Range Status   Specimen Description   Final    RIGHT ANTECUBITAL Performed at [facility] [address] [place], [place] [address]    Special Requests   Final    IN PEDIATRIC BOTTLE Blood Culture adequate volume Performed at [facility] [address] [place], [place] [address]    Culture   Final    NO GROWTH < 24 HOURS Performed at [facility] [address], [place], [place] [address]    Report Status PENDING  Incomplete      Radiology Studies: Ct Abdomen Pelvis W Contrast  Result Date: 06/22/2017 CLINICAL DATA:  62 y/o F; recent bowel surgery 06/11/2017 with removal of scar tissue and mass around ileostomy. History of Crohn's colitis. Nausea, low-grade fever, and leukocytosis. EXAM: CT ABDOMEN AND PELVIS WITH CONTRAST TECHNIQUE:  Multidetector CT imaging of the abdomen and pelvis was performed using the standard protocol following bolus administration of intravenous contrast. CONTRAST:  142m ISOVUE-300 IOPAMIDOL (ISOVUE-300) INJECTION 61% COMPARISON:  06/08/2017 CT abdomen and pelvis. FINDINGS: Lower chest: 3 mm left lower lobe perifissural nodule, likely intrapulmonary lymph node, is stable. Hepatobiliary: Stable subcentimeter lucencies in the liver, likely cysts. Decompressed gallbladder. No biliary dilatation. Pancreas: Unremarkable. No pancreatic ductal dilatation or surrounding inflammatory changes. Spleen: Normal in size without focal abnormality. Adrenals/Urinary Tract: Adrenal glands are unremarkable. Multiple cortical scars in the kidneys bilaterally. Kidneys are otherwise normal, without renal calculi, focal lesion, or hydronephrosis. Bladder is unremarkable. Stomach/Bowel: Partial gastrectomy. Mild wall thickening of small bowel in the mid abdomen with interval decompression. Colectomy. Vascular/Lymphatic: Aortic atherosclerosis. No enlarged abdominal or pelvic lymph nodes. 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 Other: Midline anterior abdominal wall fluid collection centered in subcutaneous fat with small intraperitoneal component measuring 8.8 x 6.5 x 15.9 cm (AP x ML x CC series 2, image 58 and series 7, image 72). Collection in the right lower quadrant ileostomy measuring 1.8 x 10.5 x 7.8 cm (series 6, image 26 and series 7, image 37) centered in subcutaneous fat. Fluid collections demonstrate mild peripheral enhancement. Mild edema in the mesenteric. Musculoskeletal: No fracture is seen. Grade 1 L4-5 anterolisthesis and lower lumbar facet hypertrophy. IMPRESSION: 1. Large fluid collections centered in subcutaneous fat of the midline anterior abdominal wall and surrounding the ileostomy with mild rim enhancement which may represent infection. 2. Mild wall thickening of small bowel in the mid  abdomen. Mild mesenteric edema. Inflammation may be due to recent surgery, history of Crohn's, or infection. Electronically Signed   By: LKristine GarbeM.D.   On: 06/22/2017 14:09      Medication:   . cholecalciferol  1,000 Units Oral Daily  . cyanocobalamin  1,000 mcg Intramuscular Q14 Days  . multivitamin with minerals  1 tablet Oral Daily  . pantoprazole  40 mg Oral Daily    Continuous Infusions: . sodium chloride 100 mL/hr at 06/24/17 0004  . piperacillin-tazobactam (ZOSYN)  IV 3.375 g (06/24/17 0724)  . vancomycin 1,000 mg (06/23/17 1739)  Time spent: 20 minutes  Signed, Lanna Poche, PA-S [employer] 2020 Email: [email or web address]

## 2017-06-24 NOTE — Progress Notes (Signed)
Initial Nutrition Assessment  DOCUMENTATION CODES:   Obesity unspecified  INTERVENTION:    Provide Premier Protein BID, each supplement provides 160kcal and 30g protein.   Juven Fruit Punch BID, each serving provides 80kcal and 14g of protein (amino acids glutamine and arginine)  Provide MVI daily  NUTRITION DIAGNOSIS:   Increased nutrient needs related to wound healing as evidenced by estimated needs.  GOAL:   Patient will meet greater than or equal to 90% of their needs  MONITOR:   PO intake, Supplement acceptance, Weight trends, Labs  REASON FOR ASSESSMENT:   Malnutrition Screening Tool    ASSESSMENT:   Pt with PMH significant for ulcerative colitis/Crohn's disease s/p total colectomy in 2001, ileostomy,  gastric sleeve in 2016, essential HTN, and GERD. Recently admitted 2/27 for small bowel obstriction s/p lysis of adhesions. Presents this admission with complaints of abdominal pain and purulence around surgical site. Was found to have an abdominal wall abscess most consistent with surgical site infection.    Spoke with pt at bedside. Reports having nausea three days PTA resulting in decreased intake. After discharge from last hospitalization pt tried to stick to soft diet consuming soups, creamed potatoes, eggs, and protein supplementation. Pt remains compliant with her bariatric vitamins and minerals. Takes two Flintstone vitamins, Vit D, Vit B12, additional iron, and calcium. Recommend pt to take additional thiamine as Flintstone's do not provide the 100 mg she needs. Pt consumed cheese omelet with [place] sausage this morning without complication. Amendable to Premier Protein this stay as she drinks this brand at home.   Pt reports a UBW of 220 lb. Denies any recent wt loss. Records show mainly stated weights from previous admission. Since her first hospitalization 2/18 pt has maintained weight of 218-223 lb. Do not suspect significant weight loss.   Medications  reviewed and include: Vit D, Vit B12, MVI with minerals, protonix, IV abx Labs reviewed.   NUTRITION - FOCUSED PHYSICAL EXAM:    Most Recent Value  Orbital Region  No depletion  Upper Arm Region  No depletion  Thoracic and Lumbar Region  Unable to assess  Buccal Region  Mild depletion  Temple Region  No depletion  Clavicle Bone Region  No depletion  Clavicle and Acromion Bone Region  No depletion  Scapular Bone Region  Unable to assess  Dorsal Hand  Mild depletion  Patellar Region  No depletion  Anterior Thigh Region  No depletion  Posterior Calf Region  No depletion  Edema (RD Assessment)  None  Hair  Reviewed  Eyes  Reviewed  Mouth  Reviewed  Skin  Reviewed  Nails  Reviewed     Diet Order:  Diet regular Room service appropriate? Yes; Fluid consistency: Thin  EDUCATION NEEDS:   No education needs have been identified at this time  Skin:  Skin Assessment: Skin Integrity Issues: Skin Integrity Issues:: Incisions Incisions: infected abdomen  Last BM:  06/23/17  Height:   Ht Readings from Last 1 Encounters:  06/21/17 5' 5"  (1.651 m)    Weight:   Wt Readings from Last 1 Encounters:  06/21/17 218 lb (98.9 kg)    Ideal Body Weight:  56.8 kg  BMI:  There is no height or weight on file to calculate BMI.  Estimated Nutritional Needs:   Kcal:  1600-1800 kcal/day  Protein:  70-80 g/day  Fluid:  >1.8 L/day    Mariana Single RD, LDN Clinical Nutrition Pager # - [phone]

## 2017-06-25 DIAGNOSIS — T8149XA Infection following a procedure, other surgical site, initial encounter: Secondary | ICD-10-CM

## 2017-06-25 DIAGNOSIS — N179 Acute kidney failure, unspecified: Secondary | ICD-10-CM

## 2017-06-25 LAB — CBC WITH DIFFERENTIAL/PLATELET
BASOS ABS: 0 10*3/uL (ref 0.0–0.1)
BASOS PCT: 0 %
Eosinophils Absolute: 0.2 10*3/uL (ref 0.0–0.7)
Eosinophils Relative: 2 %
HEMATOCRIT: 31.1 % — AB (ref 36.0–46.0)
HEMOGLOBIN: 9.8 g/dL — AB (ref 12.0–15.0)
Lymphocytes Relative: 30 %
Lymphs Abs: 2.9 10*3/uL (ref 0.7–4.0)
MCH: 30 pg (ref 26.0–34.0)
MCHC: 31.5 g/dL (ref 30.0–36.0)
MCV: 95.1 fL (ref 78.0–100.0)
Monocytes Absolute: 0.7 10*3/uL (ref 0.1–1.0)
Monocytes Relative: 7 %
NEUTROS ABS: 5.9 10*3/uL (ref 1.7–7.7)
NEUTROS PCT: 61 %
Platelets: 550 10*3/uL — ABNORMAL HIGH (ref 150–400)
RBC: 3.27 MIL/uL — AB (ref 3.87–5.11)
RDW: 14.3 % (ref 11.5–15.5)
WBC: 9.8 10*3/uL (ref 4.0–10.5)

## 2017-06-25 LAB — BASIC METABOLIC PANEL
ANION GAP: 8 (ref 5–15)
BUN: 10 mg/dL (ref 6–20)
CO2: 23 mmol/L (ref 22–32)
Calcium: 8.9 mg/dL (ref 8.9–10.3)
Chloride: 108 mmol/L (ref 101–111)
Creatinine, Ser: 1.09 mg/dL — ABNORMAL HIGH (ref 0.44–1.00)
GFR, EST NON AFRICAN AMERICAN: 54 mL/min — AB (ref >60)
Glucose, Bld: 89 mg/dL (ref 65–99)
Potassium: 3.8 mmol/L (ref 3.5–5.1)
Sodium: 139 mmol/L (ref 135–145)

## 2017-06-25 MED ORDER — HYDROMORPHONE HCL 1 MG/ML IJ SOLN
0.5000 mg | Freq: Once | INTRAMUSCULAR | Status: AC
  Administered 2017-06-25: 0.5 mg via INTRAVENOUS
  Filled 2017-06-25: qty 0.5

## 2017-06-25 MED ORDER — HYDROMORPHONE HCL 1 MG/ML IJ SOLN
INTRAMUSCULAR | Status: AC
  Filled 2017-06-25: qty 1

## 2017-06-25 MED ORDER — HYDROCODONE-ACETAMINOPHEN 5-325 MG PO TABS
2.0000 | ORAL_TABLET | ORAL | Status: DC | PRN
  Administered 2017-06-26 (×3): 2 via ORAL
  Filled 2017-06-25 (×3): qty 2

## 2017-06-25 NOTE — Progress Notes (Addendum)
Progress Note   Emily Sanchez ZJI:[id number] DOB: 1955-11-20 DOA: 06/22/2017 PCP: Cassandria Anger, MD   LOS: 3 days    Brief Narrative:  Emily Sanchez is a 62 year old female with a medical history significant for UC/Crohns and HTN who presented to the ED on 06/22/17 with worsening abdominal pain around her surgical site. She was previously admitted to the hospital on 2/19-2/27/19 for a SBO that required exploratory laparotomy, lysis of adhesions, and repair of parastomal hernia. Upon presentation to the ED, patient was afebrile with temperature of 99.2, BP 154/81, tachycardic 103. Initial labs demonstrated, sodium 134, Chloride 97, bicarb 20, Cr 1.15, anion gap 17, Lipase 91, WBC 21.1, RBC 3.66, Hgb 11.3, HCT 33.8, platelets 473, Neut# 17.9, Monocyte 1.2. CT of abdomen and pelvis demonstrated large fluid collection centered in subcutaneous fat of midline anterior abdominal wall & surrounding ileostomy with mild rim enhancement and mild wall thickening of small bowel. Abscess was drained at bedside and was found to have copious purulent drainage that was malodorous.   Patient was admitted to the hospital on the working diagnosis of abdominal wall abscess with surrounding cellulitis at previous surgical site from exploratory laparotomy complicated by sepsis.  Assessment/Plan:   Principal Problem:   Surgical site infection Active Problems:   B12 deficiency   Crohn's disease ([place])   Status post total colectomy and ileostomy for Crohn's   S/P laparoscopic sleeve gastrectomy Dec 2016  1. Abdominal wall abscess with surrounding cellulitis at previous surgical site for ex lap: Blood cultures demonstrate no growth x2 days, waiting final results. Wound culture was positive for moderate pasteurella multocida and moderate gram negative rods. Surgery following patient and does not feel she is a candidate for wound vac. Continue twice daily dressing changes wet to dry. Continue pain management as  needed. Leukocytosis as resolved with WBC 9.8. Wound appeared to be healing well with no purulent discharge. Continue IV zosyn.   2. Ulcerative colitis/Crohn's disease: Patient had a total colectomy in 2001 and gastric sleeve in 2016 which are stable. Continue vitamin D and B12 supplementation. Continue to monitor. Follow nutritionist plan for protein supplement and Juven BID daily.  3. Hypertension: BP stable. Hold home medications, but restart medications if BP starts to increase. Continue to monitor.   4. Acute Kidney Injury: Likely prerenal etiology upon ED presentation due to decrease p.o tolerance. Creatinine levels have improved over the past 24 hours and is now 1.09. Continue oral hydration. Monitor trend with BMP in am.   Family Communication/Anticipated D/C date and plan/Code Status   DVT prophylaxis: SCDs Code Status: Full Family Communication: No family at bedside Disposition Plan: Home   Medical Consultants:  Surgery    Antimicrobials:  Zosyn   Subjective:  Patient is awake and alert. She states her pain has improved, but she is still experiencing moderate pain, mostly with movement. She has been able to ambulate around the hallway without any problems. Patient is tolerating p.o well.    Objective:   Vitals:   06/24/17 0606 06/24/17 1350 06/24/17 2127 06/25/17 0631  BP: 135/78 121/74 119/78 132/76  Pulse: 89 (!) 103 91 92  Resp:  20 18 18   Temp: 98 F (36.7 C) 98.2 F (36.8 C) 97.8 F (36.6 C) 98 F (36.7 C)  TempSrc: Oral Oral Oral Oral  SpO2: 100% 97% 99% 100%    Intake/Output Summary (Last 24 hours) at 06/25/2017 1031 Last data filed at 06/24/2017 1124 Gross per 24 hour  Intake 100  ml  Output -  Net 100 ml   There were no vitals filed for this visit.   Physical Exam:   Constitutional: NAD, awake and alert.  Eyes: lids and conjunctivae normal ENMT: Mucous membranes are moist.  Neck: normal, supple, no masses Respiratory: clear to  auscultation bilaterally, no wheezing, no crackles. Normal respiratory effort. No accessory muscle use.  Cardiovascular: Regular rate and rhythm, normal S1-S2, no murmurs / rubs / gallops. No LE edema.  Abdomen: soft, nontender and nondistended. Bowel sounds positive. Open midline incision with no surrounding erythema. Musculoskeletal: no clubbing / cyanosis. No joint deformity upper and lower extremities. Normal muscle tone.  Skin: As noted above Neurologic: non-focal Psychiatric: Alert and oriented x 3  Data Reviewed:   I have independently reviewed following labs and imaging studies:   CBC: Recent Labs  Lab 07-09-17 1530 06/22/17 1545 06/22/17 1613 06/23/17 0528 06/25/17 0630  WBC 18.3 Repeated and verified X2.* 21.1*  --  15.0* 9.8  NEUTROABS 14.6* 17.9*  --   --  5.9  HGB 11.6* 11.3* 11.9* 10.6* 9.8*  HCT 34.8* 33.8* 35.0* 32.9* 31.1*  MCV 91.4 92.3  --  94.3 95.1  PLT 431.0* 473*  --  471* 425*   Basic Metabolic Panel: Recent Labs  Lab 07/09/17 1530 06/22/17 1545 06/22/17 1613 06/23/17 0528 06/24/17 0613 06/25/17 0630  NA 135 134* 134* 138  --  139  K 4.7 3.8 3.7 3.9  --  3.8  CL 97 97* 97* 103  --  108  CO2 26 20*  --  19*  --  23  GLUCOSE 72 73 70 79  --  89  BUN 13 11 8 11   --  10  CREATININE 0.97 1.15* 1.00 1.27* 1.17* 1.09*  CALCIUM 10.1 9.3  --  9.3  --  8.9   GFR: Estimated Creatinine Clearance: 63.1 mL/min (A) (by C-G formula based on SCr of 1.09 mg/dL (H)). Liver Function Tests: Recent Labs  Lab July 09, 2017 1530 06/22/17 1545  AST 12 18  ALT 14 17  ALKPHOS 108 115  BILITOT 0.4 1.3*  PROT 8.2 8.0  ALBUMIN 3.3* 3.0*   Recent Labs  Lab 06/22/17 1545  LIPASE 91*   No results for input(s): AMMONIA in the last 168 hours. Coagulation Profile: No results for input(s): INR, PROTIME in the last 168 hours. Cardiac Enzymes: No results for input(s): CKTOTAL, CKMB, CKMBINDEX, TROPONINI in the last 168 hours. BNP (last 3 results) No results for  input(s): PROBNP in the last 8760 hours. HbA1C: No results for input(s): HGBA1C in the last 72 hours. CBG: No results for input(s): GLUCAP in the last 168 hours. Lipid Profile: No results for input(s): CHOL, HDL, LDLCALC, TRIG, CHOLHDL, LDLDIRECT in the last 72 hours. Thyroid Function Tests: No results for input(s): TSH, T4TOTAL, FREET4, T3FREE, THYROIDAB in the last 72 hours. Anemia Panel: No results for input(s): VITAMINB12, FOLATE, FERRITIN, TIBC, IRON, RETICCTPCT in the last 72 hours. Urine analysis:    Component Value Date/Time   COLORURINE YELLOW 06/22/2017 1545   APPEARANCEUR CLEAR 06/22/2017 1545   LABSPEC >1.046 (H) 06/22/2017 1545   PHURINE 5.0 06/22/2017 1545   GLUCOSEU NEGATIVE 06/22/2017 1545   GLUCOSEU NEGATIVE 02/10/2013 0914   HGBUR SMALL (A) 06/22/2017 1545   BILIRUBINUR NEGATIVE 06/22/2017 1545   KETONESUR 80 (A) 06/22/2017 1545   PROTEINUR 100 (A) 06/22/2017 1545   UROBILINOGEN 0.2 02/10/2013 0914   NITRITE NEGATIVE 06/22/2017 1545   LEUKOCYTESUR NEGATIVE 06/22/2017 1545   Sepsis  Labs: Invalid input(s): PROCALCITONIN, LACTICIDVEN  Recent Results (from the past 240 hour(s))  Blood culture (routine x 2)     Status: None (Preliminary result)   Collection Time: 06/22/17  4:35 PM  Result Value Ref Range Status   Specimen Description BLOOD LEFT ANTECUBITAL  Final   Special Requests   Final    BOTTLES DRAWN AEROBIC AND ANAEROBIC Blood Culture adequate volume   Culture   Final    NO GROWTH 2 DAYS Performed at [facility], [address], [place], [place] [address]    Report Status PENDING  Incomplete  Aerobic/Anaerobic Culture (surgical/deep wound)     Status: None (Preliminary result)   Collection Time: 06/22/17  5:08 PM  Result Value Ref Range Status   Specimen Description   Final    ABDOMEN Performed at [place] [address] [place], [place] [address]    Special Requests   Final    NONE Performed at [facility], [place] [address] [place], [place] [address]    Gram Stain   Final    ABUNDANT WBC PRESENT, PREDOMINANTLY PMN ABUNDANT GRAM NEGATIVE RODS MODERATE GRAM POSITIVE COCCI Performed at [facility], [place] [address], [place], [place] [address]    Culture   Final    MODERATE GRAM NEGATIVE RODS MODERATE PASTEURELLA MULTOCIDA Usually susceptible to penicillin and other beta lactam agents,quinolones,macrolides and tetracyclines. NO ANAEROBES ISOLATED; CULTURE IN PROGRESS FOR 5 DAYS    Report Status PENDING  Incomplete  Culture, blood (Routine X 2) w Reflex to ID Panel     Status: None (Preliminary result)   Collection Time: 06/22/17  5:43 PM  Result Value Ref Range Status   Specimen Description   Final    RIGHT ANTECUBITAL Performed at [place] [address] [place], [place] [address]    Special Requests   Final    IN PEDIATRIC BOTTLE Blood Culture adequate volume Performed at [facility] [address], [place], [place] [address]    Culture   Final    NO GROWTH 2 DAYS Performed at [place] [address], [place],  [address]    Report Status PENDING  Incomplete      Radiology Studies: No results found.    Medication:   . cholecalciferol  1,000 Units Oral Daily  . cyanocobalamin  1,000 mcg Intramuscular Q14 Days  . multivitamin with minerals  1 tablet Oral Daily  . nutrition supplement (JUVEN)  1 packet Oral BID BM  . pantoprazole  40 mg Oral Daily  . protein supplement shake  11 oz Oral BID BM    Continuous Infusions: . piperacillin-tazobactam (ZOSYN)  IV 3.375 g (06/25/17 0757)      Time spent: 25 minutes  Signed, Lanna Poche, PA-S [employer] 2020 Email: [email or web address]

## 2017-06-25 NOTE — Discharge Instructions (Signed)
MIDLINE WOUND CARE: °- midline dressing to be changed twice daily °- supplies: sterile saline, kerlix, scissors, ABD pads, tape  °- remove dressing and all packing carefully, moistening with sterile saline as needed to avoid packing/internal dressing sticking to the wound. °- clean edges of skin around the wound with water/gauze, making sure there is no tape debris or leakage left on skin that could cause skin irritation or breakdown. °- dampen and clean kerlix with sterile saline and pack wound from wound base to skin level, making sure to take note of any possible areas of wound tracking, tunneling and packing appropriately. Wound can be packed loosely. Trim kerlix to size if a whole kerlix is not required. °- cover wound with a dry ABD pad and secure with tape.  °- write the date/time on the dry dressing/tape to better track when the last dressing change occurred. °- apply any skin protectant/powder recommended by clinician to protect skin/skin folds. °- change dressing as needed if leakage occurs, wound gets contaminated, or patient requests to shower. °- patient may shower daily with wound open and following the shower the wound should be dried and a clean dressing placed.  ° °

## 2017-06-25 NOTE — Care Management Important Message (Signed)
Important Message  Patient Details  Name: Emily Sanchez MRN: [id number] Date of Birth: [birth year]   Medicare Important Message Given:  Yes    Kerin Salen 06/25/2017, 11:30 AMImportant Message  Patient Details  Name: Emily Sanchez MRN: [id number] Date of Birth: 11/29/1955   Medicare Important Message Given:  Yes    Kerin Salen 06/25/2017, 11:30 AM

## 2017-06-25 NOTE — Progress Notes (Signed)
[facility] Progress Note     Subjective: CC-  Patient states that she continues to have pain with dressing changes. Otherwise doing well. Ambulating without issues. Tolerating diet. WBC WNL, VSS.  Objective: Vital signs in last 24 hours: Temp:  [97.8 F (36.6 C)-98.2 F (36.8 C)] 98 F (36.7 C) (03/08 0631) Pulse Rate:  [91-103] 92 (03/08 0631) Resp:  [18-20] 18 (03/08 0631) BP: (119-132)/(74-78) 132/76 (03/08 0631) SpO2:  [97 %-100 %] 100 % (03/08 0631) Last BM Date: 06/24/17  Intake/Output from previous day: 03/07 0701 - 03/08 0700 In: 220 [P.O.:120; IV Piggyback:100] Out: -  Intake/Output this shift: No intake/output data recorded.  PE: Gen: Alert, NAD HEENT: EOM's intact, pupils equal and round Pulm: effort normal Abd: Soft, ND, +BS, no HSM, no hernia,open midline incision with trace purulent drainage at base, no surrounding erythema Psych: A&Ox3  Skin: no rashes noted, warm and dry  Lab Results:  Recent Labs    06/23/17 0528 06/25/17 0630  WBC 15.0* 9.8  HGB 10.6* 9.8*  HCT 32.9* 31.1*  PLT 471* 550*   BMET Recent Labs    06/23/17 0528 06/24/17 0613 06/25/17 0630  NA 138  --  139  K 3.9  --  3.8  CL 103  --  108  CO2 19*  --  23  GLUCOSE 79  --  89  BUN 11  --  10  CREATININE 1.27* 1.17* 1.09*  CALCIUM 9.3  --  8.9   PT/INR No results for input(s): LABPROT, INR in the last 72 hours. CMP     Component Value Date/Time   NA 139 06/25/2017 0630   K 3.8 06/25/2017 0630   CL 108 06/25/2017 0630   CO2 23 06/25/2017 0630   GLUCOSE 89 06/25/2017 0630   BUN 10 06/25/2017 0630   CREATININE 1.09 (H) 06/25/2017 0630   CREATININE 1.18 (H) 12/05/2013 0854   CALCIUM 8.9 06/25/2017 0630   PROT 8.0 06/22/2017 1545   ALBUMIN 3.0 (L) 06/22/2017 1545   AST 18 06/22/2017 1545   ALT 17 06/22/2017 1545   ALKPHOS 115 06/22/2017 1545   BILITOT 1.3 (H) 06/22/2017 1545   GFRNONAA 54 (L) 06/25/2017 0630   GFRAA >60 06/25/2017 0630    Lipase     Component Value Date/Time   LIPASE 91 (H) 06/22/2017 1545       Studies/Results: No results found.  Anti-infectives: Anti-infectives (From admission, onward)   Start     Dose/Rate Route Frequency Ordered Stop   06/23/17 1800  vancomycin (VANCOCIN) IVPB 1000 mg/200 mL premix  Status:  Discontinued     1,000 mg 200 mL/hr over 60 Minutes Intravenous Every 24 hours 06/22/17 1905 06/25/17 0921   06/23/17 0000  piperacillin-tazobactam (ZOSYN) IVPB 3.375 g  Status:  Discontinued     3.375 g 12.5 mL/hr over 240 Minutes Intravenous Every 6 hours 06/22/17 2210 06/23/17 0335   06/23/17 0000  piperacillin-tazobactam (ZOSYN) IVPB 3.375 g     3.375 g 12.5 mL/hr over 240 Minutes Intravenous Every 8 hours 06/22/17 1905     06/22/17 2215  vancomycin (VANCOCIN) IVPB 1000 mg/200 mL premix  Status:  Discontinued     1,000 mg 200 mL/hr over 60 Minutes Intravenous Every 24 hours 06/22/17 2210 06/23/17 1519   06/22/17 1700  vancomycin (VANCOCIN) 2,000 mg in sodium chloride 0.9 % 500 mL IVPB     2,000 mg 250 mL/hr over 120 Minutes Intravenous  Once 06/22/17 1623 06/22/17 2028   06/22/17 1630  piperacillin-tazobactam (ZOSYN) IVPB 3.375 g     3.375 g 100 mL/hr over 30 Minutes Intravenous  Once 06/22/17 1616 06/22/17 1832   06/22/17 1630  vancomycin (VANCOCIN) IVPB 1000 mg/200 mL premix  Status:  Discontinued     1,000 mg 200 mL/hr over 60 Minutes Intravenous  Once 06/22/17 1616 06/22/17 1623       Assessment/Plan HTN S/p gastric sleeve Crohn's/ulcerative colitis - not on medication GERD  Wound infection -S/pEXPLORATORY LAPAROTOMY, LYSIS OF ADHESIONS, REPAIR OF PARASTOMAL HERNIA2/22 Dr. Marcello Moores - wound opened 3/5, culture growing PASTEURELLA MULTOCIDA, susceptibilities pending - BID wet to dry dressing changes - shower with wound open  ID -zosyn/vancomycin 3/5>> FEN -regular diet VTE -SCDs, ok for chemical DVT prophylaxis from our standpoint Foley -none  Plan-  Wound still with trace purulent drainage and not a very good shape for wound vac. Recommend continuing BID wet to dry dressing changes at discharge and send home with oral antibiotics. I will order [facility] for wound care. Ready for discharge once pain is controlled on oral medication. Patient will follow up with Dr. Marcello Moores in clinic.   LOS: 3 days    Wellington Hampshire , [facility] 06/25/2017, 10:24 AM Pager: [phone] Consults: [phone] Mon-Fri 7:00 am-4:30 pm Sat-Sun 7:00 am-11:30 am

## 2017-06-26 LAB — BASIC METABOLIC PANEL
Anion gap: 8 (ref 5–15)
BUN: 19 mg/dL (ref 6–20)
CALCIUM: 8.7 mg/dL — AB (ref 8.9–10.3)
CO2: 25 mmol/L (ref 22–32)
CREATININE: 1.19 mg/dL — AB (ref 0.44–1.00)
Chloride: 106 mmol/L (ref 101–111)
GFR calc Af Amer: 56 mL/min — ABNORMAL LOW (ref >60)
GFR, EST NON AFRICAN AMERICAN: 48 mL/min — AB (ref >60)
GLUCOSE: 91 mg/dL (ref 65–99)
POTASSIUM: 3.4 mmol/L — AB (ref 3.5–5.1)
SODIUM: 139 mmol/L (ref 135–145)

## 2017-06-26 LAB — CBC WITH DIFFERENTIAL/PLATELET
Basophils Absolute: 0.1 10*3/uL (ref 0.0–0.1)
Basophils Relative: 1 %
EOS ABS: 0.2 10*3/uL (ref 0.0–0.7)
EOS PCT: 3 %
HCT: 28.4 % — ABNORMAL LOW (ref 36.0–46.0)
Hemoglobin: 9 g/dL — ABNORMAL LOW (ref 12.0–15.0)
LYMPHS PCT: 32 %
Lymphs Abs: 2.3 10*3/uL (ref 0.7–4.0)
MCH: 29.8 pg (ref 26.0–34.0)
MCHC: 31.7 g/dL (ref 30.0–36.0)
MCV: 94 fL (ref 78.0–100.0)
MONO ABS: 0.7 10*3/uL (ref 0.1–1.0)
Monocytes Relative: 9 %
Neutro Abs: 4 10*3/uL (ref 1.7–7.7)
Neutrophils Relative %: 55 %
PLATELETS: 493 10*3/uL — AB (ref 150–400)
RBC: 3.02 MIL/uL — AB (ref 3.87–5.11)
RDW: 14.3 % (ref 11.5–15.5)
WBC: 7.2 10*3/uL (ref 4.0–10.5)

## 2017-06-26 MED ORDER — HYDROCODONE-ACETAMINOPHEN 5-325 MG PO TABS: 2.0000 | ORAL_TABLET | Freq: Four times a day (QID) | ORAL | 0 refills | Status: DC | PRN

## 2017-06-26 MED ORDER — OXYCODONE HCL ER 10 MG PO T12A
10.0000 mg | EXTENDED_RELEASE_TABLET | Freq: Two times a day (BID) | ORAL | Status: DC
  Administered 2017-06-26: 10 mg via ORAL
  Filled 2017-06-26: qty 1

## 2017-06-26 MED ORDER — SULFAMETHOXAZOLE-TRIMETHOPRIM 800-160 MG PO TABS
2.0000 | ORAL_TABLET | Freq: Two times a day (BID) | ORAL | Status: DC
  Administered 2017-06-26: 2 via ORAL
  Filled 2017-06-26: qty 2

## 2017-06-26 MED ORDER — SULFAMETHOXAZOLE-TRIMETHOPRIM 800-160 MG PO TABS: 2.0000 | ORAL_TABLET | Freq: Two times a day (BID) | ORAL | 0 refills | Status: AC

## 2017-06-26 NOTE — Discharge Summary (Signed)
Physician Discharge Summary  Emily Sanchez  PYK:[id number]  DOB: 12-31-1955  DOA: 06/22/2017 PCP: Cassandria Anger, MD  Admit date: 06/22/2017 Discharge date: 06/26/2017  Admitted From: Home Disposition: Home  Recommendations for Outpatient Follow-up:  1. Follow up with PCP in 1-2 weeks 2. Wound care twice daily  wet to dry dressing changes. 3. Home health for wound care 4. Complete antibiotic course for 7 days 5. Please obtain BMP/CBC in one week to monitor renal function and hemoglobin 6. Follow-up with Dr. Marcello Moores in 1 week  Home Health: Wound care/RN  Discharge Condition: Stable CODE STATUS: Full code Diet recommendation: Heart Healthy  Brief/Interim Summary: For full details see H&P/Progress note, but in brief, Emily Sanchez is a 62 year old female with medical history significant for UC/Crohn's disease, hypertension who presented to the emergency department complaining of abdominal pain around her surgical site. She was recently discharged on 06/16/17 after being admitted for SBO that required ex lap with lysis of adhesions. Upon initial evaluation CT of the abdomen and pelvis show large fluid collection centered in the subcutaneous fat of midline anterior abdominal wall concerning of abscess. Abscess was drained at bedside and was found to have a copious foul-smelling purulent drainage.Patient was admitted with working diagnosis of abdominal wall abscess around the surgical site with surrounding cellulitis.  Wound culture was positive for Pasteurella and Enterobacter.  Surgery was consulted and recommending dressing changes wet-to-dry twice a day.  No good fit for wound VAC.  Pain was controlled with oral medication and patient was deemed stable to be discharged to continue with care as an outpatient.  Subjective: Patient seen and examined, pain is well controlled.  No new complaints.  Wound is looking good  Discharge Diagnoses/Hospital Course:  Abdominal wall abscess with  surrounding cellulitisatprevious surgical site from ex lap Status post I&D, patient initially treated with Zosyn and vancomycin.  Wound cultures show posterior and Enterobacter sensitive to Cipro and Bactrim.  She has been switched to oral Bactrim to complete 7 days of therapy.   general surgery was consulted did not feel that wound VAC was appropriate for her lesion and recommended wet-to-dry dressing changes twice a day.  Pain was controlled with IV and oral narcotics, subsequently weaned to oral Percocet.  Pain well tolerated.  Patient is to follow-up with Dr. Marcello Moores in 1 week for wound care revision.  We have set home health with wound care.  Follow-up with PCP.   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 Continue to monitor  Hypertension BP stable during hospital stay not on meds   Acute kidney injury - creatinine stable Treated with IV fluid, creatinine improved. Encourage oral hydration Check BMP in 1 week  All other chronic medical condition were stable during the hospitalization.  On the day of the discharge the patient's vitals were stable, and no other acute medical condition were reported by patient. the patient was felt safe to be discharge to home  Discharge Instructions  You were cared for by a hospitalist during your hospital stay. If you have any questions about your discharge medications or the care you received while you were in the hospital after you are discharged, you can call the unit and asked to speak with the hospitalist on call if the hospitalist that took care of you is not available. Once you are discharged, your primary care physician will handle any further medical issues. Please note that NO REFILLS for any discharge medications will be authorized  once you are discharged, as it is imperative that you return to your primary care physician (or establish a relationship with a primary care physician if you do not  have one) for your aftercare needs so that they can reassess your need for medications and monitor your lab values.  Discharge Instructions    Call MD for:  difficulty breathing, headache or visual disturbances   Complete by:  As directed    Call MD for:  extreme fatigue   Complete by:  As directed    Call MD for:  hives   Complete by:  As directed    Call MD for:  persistant dizziness or light-headedness   Complete by:  As directed    Call MD for:  persistant nausea and vomiting   Complete by:  As directed    Call MD for:  redness, tenderness, or signs of infection (pain, swelling, redness, odor or green/yellow discharge around incision site)   Complete by:  As directed    Call MD for:  severe uncontrolled pain   Complete by:  As directed    Call MD for:  temperature >100.4   Complete by:  As directed    Diet - low sodium heart healthy   Complete by:  As directed    Increase activity slowly   Complete by:  As directed      Allergies as of 06/26/2017      Reactions   Amphetamine-dextroamphet Er Nausea And Vomiting   Cymbalta [duloxetine Hcl] Other (See Comments)   "Made me sleepy."   Erythromycin Ethylsuccinate Nausea And Vomiting   Morphine Nausea And Vomiting      Medication List    STOP taking these medications   fluconazole 100 MG tablet Commonly known as:  DIFLUCAN   traMADol-acetaminophen 37.5-325 MG tablet Commonly known as:  ULTRACET     TAKE these medications   cholecalciferol 1000 units tablet Commonly known as:  VITAMIN D Take 1,000 Units by mouth daily.   cyanocobalamin 1000 MCG/ML injection Commonly known as:  (VITAMIN B-12) Inject 1 mL (1,000 mcg total) into the muscle every 14 (fourteen) days.   HYDROcodone-acetaminophen 5-325 MG tablet Commonly known as:  NORCO/VICODIN Take 2 tablets by mouth every 6 (six) hours as needed for moderate pain.   multivitamin with minerals Tabs tablet Take 1 tablet by mouth daily.   ondansetron 4 MG  tablet Commonly known as:  ZOFRAN Take 1 tablet (4 mg total) by mouth every 8 (eight) hours as needed for nausea or vomiting.   pantoprazole 40 MG tablet Commonly known as:  PROTONIX Take 1 tablet (40 mg total) by mouth daily.   sulfamethoxazole-trimethoprim 800-160 MG tablet Commonly known as:  BACTRIM DS,SEPTRA DS Take 2 tablets by mouth every 12 (twelve) hours for 7 days.   SYRINGE-NEEDLE (DISP) 3 ML 23G X 1" 3 ML Misc Commonly known as:  B-D INTEGRA SYRINGE 1 Device by Does not apply route every 30 (thirty) days. For B12 shots   BD INTEGRA SYRINGE 25G X 1" 3 ML Misc Generic drug:  SYRINGE-NEEDLE (DISP) 3 ML TO BE USED FOR B12 INJECTIONS      Follow-up Information    Leighton Ruff, MD. Go on 0/73/7106.   Specialty:  General Surgery Why:  Your appointment is 06/29/17 at 10:50AM. Please arrive 15 minutes early to check in and fill out paperwork. Contact information: [place] [address] [phone]        Plotnikov, Evie Lacks,  MD. Schedule an appointment as soon as possible for a visit in 1 week(s).   Specialty:  Internal Medicine Why:  Hospital follow-up Contact information: [address] [place] [address] [phone]          Allergies  Allergen Reactions  . Amphetamine-Dextroamphet Er Nausea And Vomiting  . Cymbalta [Duloxetine Hcl] Other (See Comments)    "Made me sleepy."  . Erythromycin Ethylsuccinate Nausea And Vomiting  . Morphine Nausea And Vomiting    Consultations:  Gen Surgery    Procedures/Studies: Dg Abd 1 View  Result Date: 06/11/2017 CLINICAL DATA:  Abdominal pain and distention. EXAM: ABDOMEN - 1 VIEW COMPARISON:  06/10/2017 FINDINGS: The enteric tube tip is in the stomach with side port below GE junction. Within the left abdomen there are persistent dilated loops of small bowel which measure up to 3.7 cm. Not significantly changed from previous exam. Enteric contrast material is again noted within small  bowel loops. Diminished gas within the colon. IMPRESSION: 1. Enteric tube tip is within the stomach. 2. Persistent small bowel dilatation with enteric contrast material within small bowel compatible with small bowel obstruction. Electronically Signed   By: Kerby Moors M.D.   On: 06/11/2017 08:37   Dg Abd 1 View  Result Date: 06/10/2017 CLINICAL DATA:  Small-bowel obstruction. EXAM: ABDOMEN - 1 VIEW COMPARISON:  06/09/2017.  06/08/2017.  CT 06/08/2017. FINDINGS: NG tube noted with tip below left hemidiaphragm. Surgical sutures are noted over the left abdomen. Worsening distention of small-bowel loops. Residual enteric contrast noted in mid abdominal and right lower quadrant small-bowel loops. These findings are consistent with persistent small-bowel obstruction. No free air. No acute bony abnormality IMPRESSION: 1.  NG tube noted with its tip over the stomach. 2. Progressive small-bowel distention. Residual enteric contrast noted in the mid abdominal and right lower quadrant small bowel loops. These findings are consistent with persistent small-bowel obstruction. Electronically Signed   By: Marcello Moores  Register   On: 06/10/2017 07:19   Dg Abdomen 1 View  Result Date: 06/08/2017 CLINICAL DATA:  NG tube placement EXAM: ABDOMEN - 1 VIEW COMPARISON:  CT abdomen pelvis 06/08/2017 FINDINGS: Tip and side port of the nasogastric tube overlie the stomach. No dilated bowel is visible. IMPRESSION: Tip and side port of the nasogastric tube in the stomach. Electronically Signed   By: Ulyses Jarred M.D.   On: 06/08/2017 05:58   Ct Abdomen Pelvis W Contrast  Result Date: 06/22/2017 CLINICAL DATA:  62 y/o F; recent bowel surgery 06/11/2017 with removal of scar tissue and mass around ileostomy. History of Crohn's colitis. Nausea, low-grade fever, and leukocytosis. EXAM: CT ABDOMEN AND PELVIS WITH CONTRAST TECHNIQUE: Multidetector CT imaging of the abdomen and pelvis was performed using the standard protocol following bolus  administration of intravenous contrast. CONTRAST:  1107m ISOVUE-300 IOPAMIDOL (ISOVUE-300) INJECTION 61% COMPARISON:  06/08/2017 CT abdomen and pelvis. FINDINGS: Lower chest: 3 mm left lower lobe perifissural nodule, likely intrapulmonary lymph node, is stable. Hepatobiliary: Stable subcentimeter lucencies in the liver, likely cysts. Decompressed gallbladder. No biliary dilatation. Pancreas: Unremarkable. No pancreatic ductal dilatation or surrounding inflammatory changes. Spleen: Normal in size without focal abnormality. Adrenals/Urinary Tract: Adrenal glands are unremarkable. Multiple cortical scars in the kidneys bilaterally. Kidneys are otherwise normal, without renal calculi, focal lesion, or hydronephrosis. Bladder is unremarkable. Stomach/Bowel: Partial gastrectomy. Mild wall thickening of small bowel in the mid abdomen with interval decompression. Colectomy. Vascular/Lymphatic: Aortic atherosclerosis. No enlarged abdominal or pelvic lymph nodes. 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 Other: Midline anterior abdominal wall fluid collection centered in subcutaneous fat with small intraperitoneal component measuring 8.8 x 6.5 x 15.9 cm (AP x ML x CC series 2, image 58 and series 7, image 72). Collection in the right lower quadrant ileostomy measuring 1.8 x 10.5 x 7.8 cm (series 6, image 26 and series 7, image 37) centered in subcutaneous fat. Fluid collections demonstrate mild peripheral enhancement. Mild edema in the mesenteric. Musculoskeletal: No fracture is seen. Grade 1 L4-5 anterolisthesis and lower lumbar facet hypertrophy. IMPRESSION: 1. Large fluid collections centered in subcutaneous fat of the midline anterior abdominal wall and surrounding the ileostomy with mild rim enhancement which may represent infection. 2. Mild wall thickening of small bowel in the mid abdomen. Mild mesenteric edema. Inflammation may be due to recent surgery, history of Crohn's, or infection.  Electronically Signed   By: Kristine Garbe M.D.   On: 06/22/2017 14:09   Ct Abdomen Pelvis W Contrast  Result Date: 06/08/2017 CLINICAL DATA:  Abdominal pain and distension. Nausea and vomiting. No output from ostomy. EXAM: CT ABDOMEN AND PELVIS WITH CONTRAST TECHNIQUE: Multidetector CT imaging of the abdomen and pelvis was performed using the standard protocol following bolus administration of intravenous contrast. CONTRAST:  80 cc Isovue-300 IV COMPARISON:  CT 10/29/2015 FINDINGS: Lower chest: Tiny subpleural nodule in the left lower lobe is unchanged from prior exam and considered benign. No consolidation. No pleural fluid. Hepatobiliary: Scattered subcentimeter hepatic hypodensities are too small to be accurately characterize. Unchanged appearance of gallbladder without calcified gallstone or biliary dilatation. Pancreas: Pancreatic ductal prominence measuring 6 mm in the body. No peripancreatic inflammation. No evidence of focal pancreatic mass. Spleen: Normal in size without focal abnormality. Adrenals/Urinary Tract: No adrenal nodule. Right renal scarring related to prior trauma. Prior subcapsular hematoma has resolved. Mild left renal cortical scarring that is chronic. No hydronephrosis. No perinephric edema. Absent delayed excretion on delayed imaging. Urinary bladder is near completely decompressed. Stomach/Bowel: Post gastric sleeve resection. Stomach is nondistended. Right lower quadrant ileostomy. Increased size of parastomal hernia. Small bowel obstruction within the parastomal hernia with dilated fecalized small bowel contents in the hernia sac. The small bowel in the central abdomen and lower pelvis are also dilated and fluid-filled. There is associated mesenteric edema in the pelvis. No pneumatosis or perforation. Patient is post total colectomy. Vascular/Lymphatic: Mild aortic atherosclerosis. Small retroperitoneal nodes, not enlarged by size criteria. 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 No adnexal mass. Other: Small amount of free fluid. Mesenteric edema related to of small bowel dilatation. No free air. Small fat containing midline ventral abdominal wall hernia. Musculoskeletal: There are no acute or suspicious osseous abnormalities. IMPRESSION: 1. Right lower quadrant ileostomy with small bowel obstruction, transition point at the parastomal hernia. 2. Pancreatic ductal prominence of 6 mm, likely sequela of prior pancreatic trauma. 3. Bilateral renal cortical scarring. Absent renal excretion on delayed phase imaging suggest underlying renal dysfunction. Electronically Signed   By: Jeb Levering M.D.   On: 06/08/2017 04:14   Dg Abd Portable 1v  Result Date: 06/12/2017 CLINICAL DATA:  Assess nasogastric tube placement. EXAM: PORTABLE ABDOMEN - 1 VIEW COMPARISON:  Abdominal radiographs June 11, 2017 FINDINGS: Nasogastric tube tip projects in proximal stomach. Surgical staples in LEFT upper quadrant. Paucity of large bowel gas. Mildly dilated small bowel LEFT upper quadrant, decreased from prior imaging. No intra-abdominal mass effect. Soft tissue planes and included osseous structures are non suspicious. IMPRESSION: Nasogastric tube tip projects in proximal  stomach. Improving small bowel obstruction. Electronically Signed   By: Elon Alas M.D.   On: 06/12/2017 22:21   Dg Abd Portable 1v-small Bowel Obstruction Protocol-initial, 8 Hr Delay  Result Date: 06/09/2017 CLINICAL DATA:  Small bowel obstruction protocol. 8 hour delayed film. EXAM: PORTABLE ABDOMEN - 1 VIEW COMPARISON:  Radiographs and CT yesterday. FINDINGS: Enteric tube in place with tip and side-port below the diaphragm. Administered enteric contrast within prominent central small bowel. No evidence of free air. IMPRESSION: Administered enteric contrast within small bowel in the central abdomen. Recommend 24 hour delayed film. Please note this patient is post total colectomy with right lower  quadrant ileostomy. 24 hour film should focus on the right abdomen to evaluate for ileostomy transit. Electronically Signed   By: Jeb Levering M.D.   On: 06/09/2017 02:34    Discharge Exam: Vitals:   06/25/17 2100 06/26/17 0527  BP: 121/85 114/78  Pulse: 96 88  Resp: 18 18  Temp: 98.3 F (36.8 C) 98.3 F (36.8 C)  SpO2: 100% 100%   Vitals:   06/25/17 0631 06/25/17 1349 06/25/17 2100 06/26/17 0527  BP: 132/76 120/70 121/85 114/78  Pulse: 92 90 96 88  Resp: 18 20 18 18   Temp: 98 F (36.7 C) 98.3 F (36.8 C) 98.3 F (36.8 C) 98.3 F (36.8 C)  TempSrc: Oral Oral Oral Oral  SpO2: 100% 100% 100% 100%    General: NAD Cardiovascular: RRR, S1/S2 +, no rubs, no gallops Respiratory: CTA bilaterally, no wheezing, no rhonchi Abdominal: Open wound midline. Pink with no purulent material.  Extremities: no edema   The results of significant diagnostics from this hospitalization (including imaging, microbiology, ancillary and laboratory) are listed below for reference.     Microbiology: Recent Results (from the past 240 hour(s))  Blood culture (routine x 2)     Status: None (Preliminary result)   Collection Time: 06/22/17  4:35 PM  Result Value Ref Range Status   Specimen Description BLOOD LEFT ANTECUBITAL  Final   Special Requests   Final    BOTTLES DRAWN AEROBIC AND ANAEROBIC Blood Culture adequate volume   Culture   Final    NO GROWTH 4 DAYS Performed at [facility], [address], [place], [place] [address]    Report Status PENDING  Incomplete  Aerobic/Anaerobic Culture (surgical/deep wound)     Status: None (Preliminary result)   Collection Time: 06/22/17  5:08 PM  Result Value Ref Range Status   Specimen Description   Final    ABDOMEN Performed at [facility] [address] [place] [address], [place], [place] [address]    Special Requests   Final    NONE Performed at [facility], [place] [address], [place], [place] [address]    Gram  Stain   Final    ABUNDANT WBC PRESENT, PREDOMINANTLY PMN ABUNDANT GRAM NEGATIVE RODS MODERATE GRAM POSITIVE COCCI Performed at [facility], [place] [address], [place], [place] [address]    Culture   Final    MODERATE ENTEROBACTER CLOACAE MODERATE PASTEURELLA MULTOCIDA Usually susceptible to penicillin and other beta lactam agents,quinolones,macrolides and tetracyclines. NO ANAEROBES ISOLATED; CULTURE IN PROGRESS FOR 5 DAYS    Report Status PENDING  Incomplete   Organism ID, Bacteria ENTEROBACTER CLOACAE  Final      Susceptibility   Enterobacter cloacae - MIC*    CEFAZOLIN >=64 RESISTANT Resistant     CEFEPIME <=1 SENSITIVE Sensitive     CEFTAZIDIME <=1 SENSITIVE Sensitive  CEFTRIAXONE <=1 SENSITIVE Sensitive     CIPROFLOXACIN <=0.25 SENSITIVE Sensitive     GENTAMICIN <=1 SENSITIVE Sensitive     IMIPENEM 0.5 SENSITIVE Sensitive     TRIMETH/SULFA <=20 SENSITIVE Sensitive     PIP/TAZO <=4 SENSITIVE Sensitive     * MODERATE ENTEROBACTER CLOACAE  Culture, blood (Routine X 2) w Reflex to ID Panel     Status: None (Preliminary result)   Collection Time: 06/22/17  5:43 PM  Result Value Ref Range Status   Specimen Description   Final    RIGHT ANTECUBITAL Performed at [facility] [address], [place], [place] [address]    Special Requests   Final    IN PEDIATRIC BOTTLE Blood Culture adequate volume Performed at [facility] [address], [place], [place] [address]    Culture   Final    NO GROWTH 4 DAYS Performed at [facility], [place] [address], [place], [place] [address]    Report Status PENDING  Incomplete     Labs: BNP (last 3 results) No results for input(s): BNP in the last 8760 hours. Basic Metabolic Panel: Recent Labs  Lab 06/21/17 1530 06/22/17 1545 06/22/17 1613 06/23/17 0528 06/24/17 0613 06/25/17 0630 06/26/17 0549  NA 135 134* 134* 138  --  139 139  K 4.7 3.8 3.7 3.9  --  3.8 3.4*  CL 97 97* 97* 103   --  108 106  CO2 26 20*  --  19*  --  23 25  GLUCOSE 72 73 70 79  --  89 91  BUN 13 11 8 11   --  10 19  CREATININE 0.97 1.15* 1.00 1.27* 1.17* 1.09* 1.19*  CALCIUM 10.1 9.3  --  9.3  --  8.9 8.7*   Liver Function Tests: Recent Labs  Lab 06/21/17 1530 06/22/17 1545  AST 12 18  ALT 14 17  ALKPHOS 108 115  BILITOT 0.4 1.3*  PROT 8.2 8.0  ALBUMIN 3.3* 3.0*   Recent Labs  Lab 06/22/17 1545  LIPASE 91*   No results for input(s): AMMONIA in the last 168 hours. CBC: Recent Labs  Lab 06/21/17 1530 06/22/17 1545 06/22/17 1613 06/23/17 0528 06/25/17 0630 06/26/17 0549  WBC 18.3 Repeated and verified X2.* 21.1*  --  15.0* 9.8 7.2  NEUTROABS 14.6* 17.9*  --   --  5.9 4.0  HGB 11.6* 11.3* 11.9* 10.6* 9.8* 9.0*  HCT 34.8* 33.8* 35.0* 32.9* 31.1* 28.4*  MCV 91.4 92.3  --  94.3 95.1 94.0  PLT 431.0* 473*  --  471* 550* 493*   Cardiac Enzymes: No results for input(s): CKTOTAL, CKMB, CKMBINDEX, TROPONINI in the last 168 hours. BNP: Invalid input(s): POCBNP CBG: No results for input(s): GLUCAP in the last 168 hours. D-Dimer No results for input(s): DDIMER in the last 72 hours. Hgb A1c No results for input(s): HGBA1C in the last 72 hours. Lipid Profile No results for input(s): CHOL, HDL, LDLCALC, TRIG, CHOLHDL, LDLDIRECT in the last 72 hours. Thyroid function studies No results for input(s): TSH, T4TOTAL, T3FREE, THYROIDAB in the last 72 hours.  Invalid input(s): FREET3 Anemia work up No results for input(s): VITAMINB12, FOLATE, FERRITIN, TIBC, IRON, RETICCTPCT in the last 72 hours. Urinalysis    Component Value Date/Time   COLORURINE YELLOW 06/22/2017 1545   APPEARANCEUR CLEAR 06/22/2017 1545   LABSPEC >1.046 (H) 06/22/2017 1545   PHURINE 5.0 06/22/2017 1545   GLUCOSEU NEGATIVE 06/22/2017 [place] 02/10/2013 0914  HGBUR SMALL (A) 06/22/2017 1545   BILIRUBINUR NEGATIVE 06/22/2017 1545   KETONESUR 80 (A) 06/22/2017 1545   PROTEINUR 100 (A) 06/22/2017  1545   UROBILINOGEN 0.2 02/10/2013 0914   NITRITE NEGATIVE 06/22/2017 1545   LEUKOCYTESUR NEGATIVE 06/22/2017 1545   Sepsis Labs Invalid input(s): PROCALCITONIN,  WBC,  LACTICIDVEN Microbiology Recent Results (from the past 240 hour(s))  Blood culture (routine x 2)     Status: None (Preliminary result)   Collection Time: 06/22/17  4:35 PM  Result Value Ref Range Status   Specimen Description BLOOD LEFT ANTECUBITAL  Final   Special Requests   Final    BOTTLES DRAWN AEROBIC AND ANAEROBIC Blood Culture adequate volume   Culture   Final    NO GROWTH 4 DAYS Performed at [facility], [place] [address], [place], [place] [address]    Report Status PENDING  Incomplete  Aerobic/Anaerobic Culture (surgical/deep wound)     Status: None (Preliminary result)   Collection Time: 06/22/17  5:08 PM  Result Value Ref Range Status   Specimen Description   Final    ABDOMEN Performed at [facility] [address] [place], [place] [address]    Special Requests   Final    NONE Performed at [facility], [place] [address] [place], [place] [address]    Gram Stain   Final    ABUNDANT WBC PRESENT, PREDOMINANTLY PMN ABUNDANT GRAM NEGATIVE RODS MODERATE GRAM POSITIVE COCCI Performed at [facility], [place] [address], [place],  [address]    Culture   Final    MODERATE ENTEROBACTER CLOACAE MODERATE PASTEURELLA MULTOCIDA Usually susceptible to penicillin and other beta lactam agents,quinolones,macrolides and tetracyclines. NO ANAEROBES ISOLATED; CULTURE IN PROGRESS FOR 5 DAYS    Report Status PENDING  Incomplete   Organism ID, Bacteria ENTEROBACTER CLOACAE  Final      Susceptibility   Enterobacter cloacae - MIC*    CEFAZOLIN >=64 RESISTANT Resistant     CEFEPIME <=1 SENSITIVE Sensitive     CEFTAZIDIME <=1 SENSITIVE Sensitive     CEFTRIAXONE <=1 SENSITIVE Sensitive     CIPROFLOXACIN <=0.25 SENSITIVE Sensitive     GENTAMICIN <=1 SENSITIVE  Sensitive     IMIPENEM 0.5 SENSITIVE Sensitive     TRIMETH/SULFA <=20 SENSITIVE Sensitive     PIP/TAZO <=4 SENSITIVE Sensitive     * MODERATE ENTEROBACTER CLOACAE  Culture, blood (Routine X 2) w Reflex to ID Panel     Status: None (Preliminary result)   Collection Time: 06/22/17  5:43 PM  Result Value Ref Range Status   Specimen Description   Final    RIGHT ANTECUBITAL Performed at [facility] [address], [place],  [address]    Special Requests   Final    IN PEDIATRIC BOTTLE Blood Culture adequate volume Performed at [facility] [address] [place],  [address]    Culture   Final    NO GROWTH 4 DAYS Performed at [facility], [place] [address], [place],  [address]    Report Status PENDING  Incomplete     Time coordinating discharge: 35 minutes  SIGNED:  Chipper Oman, MD  [facility] 06/26/2017, 1:40 PM  Pager please text page via  [email or web address]  Note - This record has been created using [employer]. Chart creation errors have been sought, but may not always have been located. Such creation errors do not reflect on the standard of medical care.

## 2017-06-26 NOTE — Care Management Note (Signed)
Case Management Note  Patient Details  Name: Emily Sanchez MRN: [id number] Date of Birth: Apr 08, 1956  Subjective/Objective:    abdominal wall abscess, cellulitis, exp lap abdominal              Action/Plan: NCM spoke to pt and offered choice for HH/list provided. Pt agreeable to [facility]. Unable to accept referral due to staffing. Contacted [facility] HH rep. They are able to start care on Monday. Unit RN demonstrated to pt how to do dressing change and provide supplies. Husband at home to assist with care.   Expected Discharge Date:  06/26/17               Expected Discharge Plan:  [place]  In-House Referral:  NA  Discharge planning Services  CM Consult  Post Acute Care Choice:  Home Health Choice offered to:  Patient  DME Arranged:  N/A DME Agency:  NA  HH Arranged:  RN [place] Agency:  [facility]  Status of Service:  Completed, signed off  If discussed at [place] of Stay Meetings, dates discussed:    Additional Comments:  Erenest Rasher, RN 06/26/2017, 2:06 PM

## 2017-06-26 NOTE — Progress Notes (Signed)
[facility] Progress Note     Subjective: CC-  Patient states that she continues to have pain with dressing changes.  Currently using a premed regimen with PO and IV meds. Otherwise doing well. Ambulating without issues. Tolerating diet.   Objective: Vital signs in last 24 hours: Temp:  [98.3 F (36.8 C)] 98.3 F (36.8 C) (03/09 0527) Pulse Rate:  [88-96] 88 (03/09 0527) Resp:  [18-20] 18 (03/09 0527) BP: (114-121)/(70-85) 114/78 (03/09 0527) SpO2:  [100 %] 100 % (03/09 0527) Last BM Date: 06/25/17  Intake/Output from previous day: 03/08 0701 - 03/09 0700 In: 440 [P.O.:240; IV Piggyback:200] Out: -  Intake/Output this shift: No intake/output data recorded.  PE: Gen: Alert, NAD HEENT: EOM's intact, pupils equal and round Pulm: effort normal Abd: Soft, ND, open midline incision with packing Psych: A&Ox3  Skin: no rashes noted, warm and dry  Lab Results:  Recent Labs    06/25/17 0630 06/26/17 0549  WBC 9.8 7.2  HGB 9.8* 9.0*  HCT 31.1* 28.4*  PLT 550* 493*   BMET Recent Labs    06/25/17 0630 06/26/17 0549  NA 139 139  K 3.8 3.4*  CL 108 106  CO2 23 25  GLUCOSE 89 91  BUN 10 19  CREATININE 1.09* 1.19*  CALCIUM 8.9 8.7*   PT/INR No results for input(s): LABPROT, INR in the last 72 hours. CMP     Component Value Date/Time   NA 139 06/26/2017 0549   K 3.4 (L) 06/26/2017 0549   CL 106 06/26/2017 0549   CO2 25 06/26/2017 0549   GLUCOSE 91 06/26/2017 0549   BUN 19 06/26/2017 0549   CREATININE 1.19 (H) 06/26/2017 0549   CREATININE 1.18 (H) 12/05/2013 0854   CALCIUM 8.7 (L) 06/26/2017 0549   PROT 8.0 06/22/2017 1545   ALBUMIN 3.0 (L) 06/22/2017 1545   AST 18 06/22/2017 1545   ALT 17 06/22/2017 1545   ALKPHOS 115 06/22/2017 1545   BILITOT 1.3 (H) 06/22/2017 1545   GFRNONAA 48 (L) 06/26/2017 0549   GFRAA 56 (L) 06/26/2017 0549   Lipase     Component Value Date/Time   LIPASE 91 (H) 06/22/2017 1545       Studies/Results: No  results found.  Anti-infectives: Anti-infectives (From admission, onward)   Start     Dose/Rate Route Frequency Ordered Stop   06/23/17 1800  vancomycin (VANCOCIN) IVPB 1000 mg/200 mL premix  Status:  Discontinued     1,000 mg 200 mL/hr over 60 Minutes Intravenous Every 24 hours 06/22/17 1905 06/25/17 0921   06/23/17 0000  piperacillin-tazobactam (ZOSYN) IVPB 3.375 g  Status:  Discontinued     3.375 g 12.5 mL/hr over 240 Minutes Intravenous Every 6 hours 06/22/17 2210 06/23/17 0335   06/23/17 0000  piperacillin-tazobactam (ZOSYN) IVPB 3.375 g     3.375 g 12.5 mL/hr over 240 Minutes Intravenous Every 8 hours 06/22/17 1905     06/22/17 2215  vancomycin (VANCOCIN) IVPB 1000 mg/200 mL premix  Status:  Discontinued     1,000 mg 200 mL/hr over 60 Minutes Intravenous Every 24 hours 06/22/17 2210 06/23/17 1519   06/22/17 1700  vancomycin (VANCOCIN) 2,000 mg in sodium chloride 0.9 % 500 mL IVPB     2,000 mg 250 mL/hr over 120 Minutes Intravenous  Once 06/22/17 1623 06/22/17 2028   06/22/17 1630  piperacillin-tazobactam (ZOSYN) IVPB 3.375 g     3.375 g 100 mL/hr over 30 Minutes Intravenous  Once 06/22/17 1616 06/22/17 1832   06/22/17  1630  vancomycin (VANCOCIN) IVPB 1000 mg/200 mL premix  Status:  Discontinued     1,000 mg 200 mL/hr over 60 Minutes Intravenous  Once 06/22/17 1616 06/22/17 1623       Assessment/Plan HTN S/p gastric sleeve Crohn's/ulcerative colitis - not on medication GERD  Wound infection -S/pEXPLORATORY LAPAROTOMY, LYSIS OF ADHESIONS, REPAIR OF PARASTOMAL HERNIA2/22 Dr. Marcello Moores - wound opened 3/5, culture growing PASTEURELLA MULTOCIDA, susceptibilities, sensitive to bactrim and cipro - BID wet to dry dressing changes - shower with wound open  ID -zosyn/vancomycin 3/5>>Bactrim 3/9 FEN -regular diet VTE -SCDs, ok for chemical DVT prophylaxis from our standpoint Foley -none  Plan- Wound still with trace purulent drainage and not a very good shape for wound  vac. Recommend continuing BID wet to dry dressing changes at discharge and send home with oral antibiotics. I will order [facility] for wound care. Ready for discharge once pain is controlled on oral medication. Patient will follow up with Dr. Marcello Moores in clinic.   LOS: 4 days   Rosario Adie, MD  [facility]

## 2017-06-27 LAB — CULTURE, BLOOD (ROUTINE X 2)
CULTURE: NO GROWTH
Culture: NO GROWTH
SPECIAL REQUESTS: ADEQUATE
SPECIAL REQUESTS: ADEQUATE

## 2017-06-28 ENCOUNTER — Telehealth: Payer: Self-pay | Admitting: *Deleted

## 2017-06-28 DIAGNOSIS — I129 Hypertensive chronic kidney disease with stage 1 through stage 4 chronic kidney disease, or unspecified chronic kidney disease: Secondary | ICD-10-CM | POA: Diagnosis not present

## 2017-06-28 DIAGNOSIS — Z87891 Personal history of nicotine dependence: Secondary | ICD-10-CM | POA: Diagnosis not present

## 2017-06-28 DIAGNOSIS — Z9181 History of falling: Secondary | ICD-10-CM | POA: Diagnosis not present

## 2017-06-28 DIAGNOSIS — M545 Low back pain: Secondary | ICD-10-CM | POA: Diagnosis not present

## 2017-06-28 DIAGNOSIS — T8142XA Infection following a procedure, deep incisional surgical site, initial encounter: Secondary | ICD-10-CM | POA: Diagnosis not present

## 2017-06-28 DIAGNOSIS — J309 Allergic rhinitis, unspecified: Secondary | ICD-10-CM | POA: Diagnosis not present

## 2017-06-28 DIAGNOSIS — L02211 Cutaneous abscess of abdominal wall: Secondary | ICD-10-CM | POA: Diagnosis not present

## 2017-06-28 DIAGNOSIS — F329 Major depressive disorder, single episode, unspecified: Secondary | ICD-10-CM | POA: Diagnosis not present

## 2017-06-28 DIAGNOSIS — M1990 Unspecified osteoarthritis, unspecified site: Secondary | ICD-10-CM | POA: Diagnosis not present

## 2017-06-28 DIAGNOSIS — F419 Anxiety disorder, unspecified: Secondary | ICD-10-CM | POA: Diagnosis not present

## 2017-06-28 DIAGNOSIS — L03311 Cellulitis of abdominal wall: Secondary | ICD-10-CM | POA: Diagnosis not present

## 2017-06-28 DIAGNOSIS — Z432 Encounter for attention to ileostomy: Secondary | ICD-10-CM | POA: Diagnosis not present

## 2017-06-28 DIAGNOSIS — K529 Noninfective gastroenteritis and colitis, unspecified: Secondary | ICD-10-CM | POA: Diagnosis not present

## 2017-06-28 DIAGNOSIS — K5090 Crohn's disease, unspecified, without complications: Secondary | ICD-10-CM | POA: Diagnosis not present

## 2017-06-28 DIAGNOSIS — B9689 Other specified bacterial agents as the cause of diseases classified elsewhere: Secondary | ICD-10-CM | POA: Diagnosis not present

## 2017-06-28 DIAGNOSIS — Z8701 Personal history of pneumonia (recurrent): Secondary | ICD-10-CM | POA: Diagnosis not present

## 2017-06-28 DIAGNOSIS — E538 Deficiency of other specified B group vitamins: Secondary | ICD-10-CM | POA: Diagnosis not present

## 2017-06-28 DIAGNOSIS — Z9049 Acquired absence of other specified parts of digestive tract: Secondary | ICD-10-CM | POA: Diagnosis not present

## 2017-06-28 DIAGNOSIS — K219 Gastro-esophageal reflux disease without esophagitis: Secondary | ICD-10-CM | POA: Diagnosis not present

## 2017-06-28 DIAGNOSIS — D631 Anemia in chronic kidney disease: Secondary | ICD-10-CM | POA: Diagnosis not present

## 2017-06-28 DIAGNOSIS — M799 Soft tissue disorder, unspecified: Secondary | ICD-10-CM | POA: Diagnosis not present

## 2017-06-28 DIAGNOSIS — N183 Chronic kidney disease, stage 3 (moderate): Secondary | ICD-10-CM | POA: Diagnosis not present

## 2017-06-28 NOTE — Telephone Encounter (Signed)
Transition Care Management Follow-up Telephone Call   Date discharged? 06/26/17   How have you been since you were released from the hospital? Pt states she is doing fine   Do you understand why you were in the hospital? YES   Do you understand the discharge instructions? YES   Where were you discharged to? Home   Items Reviewed:  Medications reviewed: YES  Allergies reviewed: YES  Dietary changes reviewed: YES, heart healthy  Referrals reviewed: YES, she states the nurse from home health [facility] came out today   Functional Questionnaire:   Activities of Daily Living (ADLs):   She states she are independent in the following: ambulation, bathing and hygiene, feeding, continence, grooming, toileting and dressing States they require assistance with the following: ambulation sometimes   Any transportation issues/concerns?: NO   Any patient concerns? NO   Confirmed importance and date/time of follow-up visits scheduled YES  Provider Appointment booked with 07/09/17  Confirmed with patient if condition begins to worsen call PCP or go to the ER.  Patient was given the office number and encouraged to call back with question or concerns.  : YES

## 2017-06-29 DIAGNOSIS — B9689 Other specified bacterial agents as the cause of diseases classified elsewhere: Secondary | ICD-10-CM | POA: Diagnosis not present

## 2017-06-29 DIAGNOSIS — L03311 Cellulitis of abdominal wall: Secondary | ICD-10-CM | POA: Diagnosis not present

## 2017-06-29 DIAGNOSIS — T8142XA Infection following a procedure, deep incisional surgical site, initial encounter: Secondary | ICD-10-CM | POA: Diagnosis not present

## 2017-06-29 DIAGNOSIS — L02211 Cutaneous abscess of abdominal wall: Secondary | ICD-10-CM | POA: Diagnosis not present

## 2017-06-29 DIAGNOSIS — K5090 Crohn's disease, unspecified, without complications: Secondary | ICD-10-CM | POA: Diagnosis not present

## 2017-06-29 DIAGNOSIS — K529 Noninfective gastroenteritis and colitis, unspecified: Secondary | ICD-10-CM | POA: Diagnosis not present

## 2017-06-29 LAB — AEROBIC/ANAEROBIC CULTURE W GRAM STAIN (SURGICAL/DEEP WOUND)

## 2017-06-29 LAB — AEROBIC/ANAEROBIC CULTURE (SURGICAL/DEEP WOUND)

## 2017-06-30 DIAGNOSIS — T8142XA Infection following a procedure, deep incisional surgical site, initial encounter: Secondary | ICD-10-CM | POA: Diagnosis not present

## 2017-06-30 DIAGNOSIS — K529 Noninfective gastroenteritis and colitis, unspecified: Secondary | ICD-10-CM | POA: Diagnosis not present

## 2017-06-30 DIAGNOSIS — K5090 Crohn's disease, unspecified, without complications: Secondary | ICD-10-CM | POA: Diagnosis not present

## 2017-06-30 DIAGNOSIS — B9689 Other specified bacterial agents as the cause of diseases classified elsewhere: Secondary | ICD-10-CM | POA: Diagnosis not present

## 2017-06-30 DIAGNOSIS — L03311 Cellulitis of abdominal wall: Secondary | ICD-10-CM | POA: Diagnosis not present

## 2017-06-30 DIAGNOSIS — L02211 Cutaneous abscess of abdominal wall: Secondary | ICD-10-CM | POA: Diagnosis not present

## 2017-07-01 ENCOUNTER — Telehealth: Payer: Self-pay | Admitting: Internal Medicine

## 2017-07-01 DIAGNOSIS — K529 Noninfective gastroenteritis and colitis, unspecified: Secondary | ICD-10-CM | POA: Diagnosis not present

## 2017-07-01 DIAGNOSIS — L02211 Cutaneous abscess of abdominal wall: Secondary | ICD-10-CM | POA: Diagnosis not present

## 2017-07-01 DIAGNOSIS — L03311 Cellulitis of abdominal wall: Secondary | ICD-10-CM | POA: Diagnosis not present

## 2017-07-01 DIAGNOSIS — K5090 Crohn's disease, unspecified, without complications: Secondary | ICD-10-CM | POA: Diagnosis not present

## 2017-07-01 DIAGNOSIS — B9689 Other specified bacterial agents as the cause of diseases classified elsewhere: Secondary | ICD-10-CM | POA: Diagnosis not present

## 2017-07-01 DIAGNOSIS — T8142XA Infection following a procedure, deep incisional surgical site, initial encounter: Secondary | ICD-10-CM | POA: Diagnosis not present

## 2017-07-01 MED ORDER — HYDROCHLOROTHIAZIDE 12.5 MG PO CAPS: 12.5000 mg | ORAL_CAPSULE | Freq: Every day | ORAL | 0 refills | Status: DC

## 2017-07-01 NOTE — Telephone Encounter (Signed)
Sent in hctz 12.5 mg daily which is previous medication. Needs follow up visit in 1 month.

## 2017-07-01 NOTE — Telephone Encounter (Signed)
Copied from [place] [phone]. Topic: Quick Communication - See Telephone Encounter >> Jul 01, 2017  1:27 PM Lolita Rieger, [place] wrote: CRM for notification. See Telephone encounter for:   07/01/17.[facility] called and stated that pt B/P was 168/98 and 127/95 on recheck would like to know it pt should restart her B/P medication that she was told to stop during her last ER visit   Please call nurse back [phone]  Mickell

## 2017-07-01 NOTE — Telephone Encounter (Signed)
I do not see anything about stopping BP med in discharge instructions, please advise.

## 2017-07-01 NOTE — Telephone Encounter (Signed)
Copied from [place] [phone]. Topic: Quick Communication - See Telephone Encounter >> Jul 01, 2017  1:27 PM Lolita Rieger, [place] wrote: CRM for notification. See Telephone encounter for:   07/01/17.[facility] called and stated that pt B/P was 168/98 and 127/95 on recheck would like to know it pt should restart her B/P medication that she was told to stop during her last ER visit   Please call nurse back [phone]  Mickell

## 2017-07-01 NOTE — Telephone Encounter (Signed)
Mickell from [facility] notified

## 2017-07-03 DIAGNOSIS — K5090 Crohn's disease, unspecified, without complications: Secondary | ICD-10-CM | POA: Diagnosis not present

## 2017-07-03 DIAGNOSIS — L03311 Cellulitis of abdominal wall: Secondary | ICD-10-CM | POA: Diagnosis not present

## 2017-07-03 DIAGNOSIS — L02211 Cutaneous abscess of abdominal wall: Secondary | ICD-10-CM | POA: Diagnosis not present

## 2017-07-03 DIAGNOSIS — K529 Noninfective gastroenteritis and colitis, unspecified: Secondary | ICD-10-CM | POA: Diagnosis not present

## 2017-07-03 DIAGNOSIS — B9689 Other specified bacterial agents as the cause of diseases classified elsewhere: Secondary | ICD-10-CM | POA: Diagnosis not present

## 2017-07-03 DIAGNOSIS — T8142XA Infection following a procedure, deep incisional surgical site, initial encounter: Secondary | ICD-10-CM | POA: Diagnosis not present

## 2017-07-06 DIAGNOSIS — K529 Noninfective gastroenteritis and colitis, unspecified: Secondary | ICD-10-CM | POA: Diagnosis not present

## 2017-07-06 DIAGNOSIS — T8142XA Infection following a procedure, deep incisional surgical site, initial encounter: Secondary | ICD-10-CM | POA: Diagnosis not present

## 2017-07-06 DIAGNOSIS — L03311 Cellulitis of abdominal wall: Secondary | ICD-10-CM | POA: Diagnosis not present

## 2017-07-06 DIAGNOSIS — L02211 Cutaneous abscess of abdominal wall: Secondary | ICD-10-CM | POA: Diagnosis not present

## 2017-07-06 DIAGNOSIS — K5090 Crohn's disease, unspecified, without complications: Secondary | ICD-10-CM | POA: Diagnosis not present

## 2017-07-06 DIAGNOSIS — B9689 Other specified bacterial agents as the cause of diseases classified elsewhere: Secondary | ICD-10-CM | POA: Diagnosis not present

## 2017-07-09 ENCOUNTER — Encounter: Payer: Self-pay | Admitting: Internal Medicine

## 2017-07-09 ENCOUNTER — Ambulatory Visit (INDEPENDENT_AMBULATORY_CARE_PROVIDER_SITE_OTHER): Payer: Medicare Other | Admitting: Internal Medicine

## 2017-07-09 VITALS — BP 132/72 | HR 95 | Temp 98.0°F | Ht 65.0 in | Wt 204.0 lb

## 2017-07-09 DIAGNOSIS — T8149XA Infection following a procedure, other surgical site, initial encounter: Secondary | ICD-10-CM | POA: Diagnosis not present

## 2017-07-09 DIAGNOSIS — K56609 Unspecified intestinal obstruction, unspecified as to partial versus complete obstruction: Secondary | ICD-10-CM

## 2017-07-09 DIAGNOSIS — I10 Essential (primary) hypertension: Secondary | ICD-10-CM | POA: Diagnosis not present

## 2017-07-09 DIAGNOSIS — K50118 Crohn's disease of large intestine with other complication: Secondary | ICD-10-CM | POA: Diagnosis not present

## 2017-07-09 DIAGNOSIS — N189 Chronic kidney disease, unspecified: Secondary | ICD-10-CM | POA: Diagnosis not present

## 2017-07-09 DIAGNOSIS — E538 Deficiency of other specified B group vitamins: Secondary | ICD-10-CM | POA: Diagnosis not present

## 2017-07-09 DIAGNOSIS — N183 Chronic kidney disease, stage 3 unspecified: Secondary | ICD-10-CM

## 2017-07-09 MED ORDER — CYANOCOBALAMIN 1000 MCG/ML IJ SOLN
1000.0000 ug | Freq: Once | INTRAMUSCULAR | Status: AC
  Administered 2017-07-09: 1000 ug via INTRAMUSCULAR

## 2017-07-09 MED ORDER — CYANOCOBALAMIN 1000 MCG/ML IJ SOLN: 1000.0000 ug | INTRAMUSCULAR | 3 refills | Status: DC

## 2017-07-09 NOTE — Assessment & Plan Note (Signed)
BP Readings from Last 3 Encounters:  07/09/17 132/72  06/26/17 114/78  06/21/17 130/84

## 2017-07-09 NOTE — Assessment & Plan Note (Signed)
Colostomy care

## 2017-07-09 NOTE — Assessment & Plan Note (Signed)
Dressing change q d Increase protein intake Abscess was drained at bedside and was found to have a copious foul-smelling purulent drainage.Patient was admitted with working diagnosis of abdominal wall abscess around the surgical site with surrounding cellulitis.  Wound culture was positive for Pasteurella and Enterobacter.  Surgery was consulted and recommending dressing changes wet-to-dry twice a day.  No good fit for wound VAC.

## 2017-07-09 NOTE — Progress Notes (Signed)
Subjective:  Patient ID: Emily Sanchez, female    DOB: Apr 11, 1956  Age: 62 y.o. MRN: [id number]  CC: No chief complaint on file.   HPI Koby Hartfield presents for posst-hospital OV for abd wall abscess. D/c reviewed 06/27/15 - s/p I&D  F/u SBO, LBP C/o nausea - chronic  Per hx:  "Abscess was drained at bedside and was found to have a copious foul-smelling purulent drainage.Patient was admitted with working diagnosis of abdominal wall abscess around the surgical site with surrounding cellulitis.  Wound culture was positive for Pasteurella and Enterobacter.  Surgery was consulted and recommending dressing changes wet-to-dry twice a day.  No good fit for wound VAC."    Outpatient Medications Prior to Visit  Medication Sig Dispense Refill  . BD INTEGRA SYRINGE 25G X 1" 3 ML MISC TO BE USED FOR B12 INJECTIONS 50 each 3  . cholecalciferol (VITAMIN D) 1000 units tablet Take 1,000 Units by mouth daily.    . cyanocobalamin (,VITAMIN B-12,) 1000 MCG/ML injection Inject 1 mL (1,000 mcg total) into the muscle every 14 (fourteen) days. 20 mL 3  . fluconazole (DIFLUCAN) 100 MG tablet     . hydrochlorothiazide (MICROZIDE) 12.5 MG capsule Take 1 capsule (12.5 mg total) by mouth daily. 90 capsule 0  . Multiple Vitamin (MULTIVITAMIN WITH MINERALS) TABS tablet Take 1 tablet by mouth daily.    . ondansetron (ZOFRAN) 4 MG tablet Take 1 tablet (4 mg total) by mouth every 8 (eight) hours as needed for nausea or vomiting. 20 tablet 0  . pantoprazole (PROTONIX) 40 MG tablet Take 1 tablet (40 mg total) by mouth daily. 90 tablet 3  . SYRINGE-NEEDLE, DISP, 3 ML (B-D INTEGRA SYRINGE) 23G X 1" 3 ML MISC 1 Device by Does not apply route every 30 (thirty) days. For B12 shots 50 each 3  . HYDROcodone-acetaminophen (NORCO/VICODIN) 5-325 MG tablet Take 2 tablets by mouth every 6 (six) hours as needed for moderate pain. 30 tablet 0   No facility-administered medications prior to visit.     ROS Review of Systems    Constitutional: Positive for unexpected weight change. Negative for activity change, appetite change, chills and fatigue.  HENT: Negative for congestion, mouth sores and sinus pressure.   Eyes: Negative for visual disturbance.  Respiratory: Negative for cough and chest tightness.   Gastrointestinal: Negative for abdominal pain and nausea.  Genitourinary: Negative for difficulty urinating, frequency and vaginal pain.  Musculoskeletal: Positive for arthralgias, back pain and gait problem.  Skin: Negative for pallor and rash.  Neurological: Negative for dizziness, tremors, weakness, numbness and headaches.  Psychiatric/Behavioral: Negative for confusion and sleep disturbance. The patient is nervous/anxious.     Objective:  BP 132/72 (BP Location: Left Arm, Patient Position: Sitting, Cuff Size: Large)   Pulse 95   Temp 98 F (36.7 C) (Oral)   Ht 5' 5"  (1.651 m)   Wt 204 lb (92.5 kg)   SpO2 98%   BMI 33.95 kg/m   BP Readings from Last 3 Encounters:  07/09/17 132/72  06/26/17 114/78  06/21/17 130/84    Wt Readings from Last 3 Encounters:  07/09/17 204 lb (92.5 kg)  06/21/17 218 lb (98.9 kg)  06/15/17 227 lb 1.6 oz (103 kg)    Physical Exam  Constitutional: She appears well-developed. No distress.  HENT:  Head: Normocephalic.  Right Ear: External ear normal.  Left Ear: External ear normal.  Nose: Nose normal.  Mouth/Throat: Oropharynx is clear and moist.  Eyes: Pupils are equal, round,  and reactive to light. Conjunctivae are normal. Right eye exhibits no discharge. Left eye exhibits no discharge.  Neck: Normal range of motion. Neck supple. No JVD present. No tracheal deviation present. No thyromegaly present.  Cardiovascular: Normal rate, regular rhythm and normal heart sounds.  Pulmonary/Chest: No stridor. No respiratory distress. She has no wheezes.  Abdominal: Soft. Bowel sounds are normal. She exhibits no distension and no mass. There is no tenderness. There is no  rebound and no guarding.  Musculoskeletal: She exhibits tenderness. She exhibits no edema.  Lymphadenopathy:    She has no cervical adenopathy.  Neurological: She displays normal reflexes. No cranial nerve deficit. She exhibits normal muscle tone. Coordination normal.  Skin: No rash noted. No erythema.  Psychiatric: She has a normal mood and affect. Her behavior is normal. Judgment and thought content normal.  wound is dressed LS is tender  Lab Results  Component Value Date   WBC 7.2 06/26/2017   HGB 9.0 (L) 06/26/2017   HCT 28.4 (L) 06/26/2017   PLT 493 (H) 06/26/2017   GLUCOSE 91 06/26/2017   CHOL 172 05/27/2010   TRIG 100.0 05/27/2010   HDL 46.30 05/27/2010   LDLCALC 106 (H) 05/27/2010   ALT 17 06/22/2017   AST 18 06/22/2017   NA 139 06/26/2017   K 3.4 (L) 06/26/2017   CL 106 06/26/2017   CREATININE 1.19 (H) 06/26/2017   BUN 19 06/26/2017   CO2 25 06/26/2017   TSH 0.54 02/21/2016   INR 0.96 06/09/2017   HGBA1C 5.9 12/17/2009   MICROALBUR 0.6 02/10/2013    Ct Abdomen Pelvis W Contrast  Result Date: 06/22/2017 CLINICAL DATA:  61 y/o F; recent bowel surgery 06/11/2017 with removal of scar tissue and mass around ileostomy. History of Crohn's colitis. Nausea, low-grade fever, and leukocytosis. EXAM: CT ABDOMEN AND PELVIS WITH CONTRAST TECHNIQUE: Multidetector CT imaging of the abdomen and pelvis was performed using the standard protocol following bolus administration of intravenous contrast. CONTRAST:  143m ISOVUE-300 IOPAMIDOL (ISOVUE-300) INJECTION 61% COMPARISON:  06/08/2017 CT abdomen and pelvis. FINDINGS: Lower chest: 3 mm left lower lobe perifissural nodule, likely intrapulmonary lymph node, is stable. Hepatobiliary: Stable subcentimeter lucencies in the liver, likely cysts. Decompressed gallbladder. No biliary dilatation. Pancreas: Unremarkable. No pancreatic ductal dilatation or surrounding inflammatory changes. Spleen: Normal in size without focal abnormality.  Adrenals/Urinary Tract: Adrenal glands are unremarkable. Multiple cortical scars in the kidneys bilaterally. Kidneys are otherwise normal, without renal calculi, focal lesion, or hydronephrosis. Bladder is unremarkable. Stomach/Bowel: Partial gastrectomy. Mild wall thickening of small bowel in the mid abdomen with interval decompression. Colectomy. Vascular/Lymphatic: Aortic atherosclerosis. No enlarged abdominal or pelvic lymph nodes. 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 Other: Midline anterior abdominal wall fluid collection centered in subcutaneous fat with small intraperitoneal component measuring 8.8 x 6.5 x 15.9 cm (AP x ML x CC series 2, image 58 and series 7, image 72). Collection in the right lower quadrant ileostomy measuring 1.8 x 10.5 x 7.8 cm (series 6, image 26 and series 7, image 37) centered in subcutaneous fat. Fluid collections demonstrate mild peripheral enhancement. Mild edema in the mesenteric. Musculoskeletal: No fracture is seen. Grade 1 L4-5 anterolisthesis and lower lumbar facet hypertrophy. IMPRESSION: 1. Large fluid collections centered in subcutaneous fat of the midline anterior abdominal wall and surrounding the ileostomy with mild rim enhancement which may represent infection. 2. Mild wall thickening of small bowel in the mid abdomen. Mild mesenteric edema. Inflammation may be due to recent surgery,  history of Crohn's, or infection. Electronically Signed   By: Kristine Garbe M.D.   On: 06/22/2017 14:09    Assessment & Plan:   There are no diagnoses linked to this encounter. I have discontinued Catelynn Khalid's HYDROcodone-acetaminophen. I am also having her maintain her multivitamin with minerals, SYRINGE-NEEDLE (DISP) 3 ML, BD INTEGRA SYRINGE, pantoprazole, cholecalciferol, ondansetron, cyanocobalamin, hydrochlorothiazide, and fluconazole.  No orders of the defined types were placed in this encounter.    Follow-up: No  follow-ups on file.  Walker Kehr, MD

## 2017-07-09 NOTE — Assessment & Plan Note (Signed)
Resolved

## 2017-07-09 NOTE — Assessment & Plan Note (Signed)
Monitoring labs

## 2017-07-09 NOTE — Patient Instructions (Addendum)
CBD oil Increase protein intake

## 2017-07-09 NOTE — Assessment & Plan Note (Signed)
B12 today

## 2017-07-15 DIAGNOSIS — K5090 Crohn's disease, unspecified, without complications: Secondary | ICD-10-CM | POA: Diagnosis not present

## 2017-07-15 DIAGNOSIS — L03311 Cellulitis of abdominal wall: Secondary | ICD-10-CM | POA: Diagnosis not present

## 2017-07-15 DIAGNOSIS — B9689 Other specified bacterial agents as the cause of diseases classified elsewhere: Secondary | ICD-10-CM | POA: Diagnosis not present

## 2017-07-15 DIAGNOSIS — T8142XA Infection following a procedure, deep incisional surgical site, initial encounter: Secondary | ICD-10-CM | POA: Diagnosis not present

## 2017-07-15 DIAGNOSIS — L02211 Cutaneous abscess of abdominal wall: Secondary | ICD-10-CM | POA: Diagnosis not present

## 2017-07-15 DIAGNOSIS — K529 Noninfective gastroenteritis and colitis, unspecified: Secondary | ICD-10-CM | POA: Diagnosis not present

## 2017-07-20 DIAGNOSIS — B9689 Other specified bacterial agents as the cause of diseases classified elsewhere: Secondary | ICD-10-CM | POA: Diagnosis not present

## 2017-07-20 DIAGNOSIS — K5090 Crohn's disease, unspecified, without complications: Secondary | ICD-10-CM | POA: Diagnosis not present

## 2017-07-20 DIAGNOSIS — L03311 Cellulitis of abdominal wall: Secondary | ICD-10-CM | POA: Diagnosis not present

## 2017-07-20 DIAGNOSIS — T8142XA Infection following a procedure, deep incisional surgical site, initial encounter: Secondary | ICD-10-CM | POA: Diagnosis not present

## 2017-07-20 DIAGNOSIS — L02211 Cutaneous abscess of abdominal wall: Secondary | ICD-10-CM | POA: Diagnosis not present

## 2017-07-20 DIAGNOSIS — K529 Noninfective gastroenteritis and colitis, unspecified: Secondary | ICD-10-CM | POA: Diagnosis not present

## 2017-07-30 DIAGNOSIS — L02211 Cutaneous abscess of abdominal wall: Secondary | ICD-10-CM | POA: Diagnosis not present

## 2017-07-30 DIAGNOSIS — B9689 Other specified bacterial agents as the cause of diseases classified elsewhere: Secondary | ICD-10-CM | POA: Diagnosis not present

## 2017-07-30 DIAGNOSIS — L03311 Cellulitis of abdominal wall: Secondary | ICD-10-CM | POA: Diagnosis not present

## 2017-07-30 DIAGNOSIS — K5090 Crohn's disease, unspecified, without complications: Secondary | ICD-10-CM | POA: Diagnosis not present

## 2017-07-30 DIAGNOSIS — T8142XA Infection following a procedure, deep incisional surgical site, initial encounter: Secondary | ICD-10-CM | POA: Diagnosis not present

## 2017-07-30 DIAGNOSIS — K529 Noninfective gastroenteritis and colitis, unspecified: Secondary | ICD-10-CM | POA: Diagnosis not present

## 2017-08-02 ENCOUNTER — Ambulatory Visit: Payer: Self-pay | Admitting: Internal Medicine

## 2017-08-04 DIAGNOSIS — Z9181 History of falling: Secondary | ICD-10-CM

## 2017-08-04 DIAGNOSIS — K5090 Crohn's disease, unspecified, without complications: Secondary | ICD-10-CM | POA: Diagnosis not present

## 2017-08-04 DIAGNOSIS — L03311 Cellulitis of abdominal wall: Secondary | ICD-10-CM | POA: Diagnosis not present

## 2017-08-04 DIAGNOSIS — L02211 Cutaneous abscess of abdominal wall: Secondary | ICD-10-CM | POA: Diagnosis not present

## 2017-08-04 DIAGNOSIS — N183 Chronic kidney disease, stage 3 (moderate): Secondary | ICD-10-CM | POA: Diagnosis not present

## 2017-08-04 DIAGNOSIS — F329 Major depressive disorder, single episode, unspecified: Secondary | ICD-10-CM

## 2017-08-04 DIAGNOSIS — M1990 Unspecified osteoarthritis, unspecified site: Secondary | ICD-10-CM

## 2017-08-04 DIAGNOSIS — B9689 Other specified bacterial agents as the cause of diseases classified elsewhere: Secondary | ICD-10-CM | POA: Diagnosis not present

## 2017-08-04 DIAGNOSIS — I129 Hypertensive chronic kidney disease with stage 1 through stage 4 chronic kidney disease, or unspecified chronic kidney disease: Secondary | ICD-10-CM | POA: Diagnosis not present

## 2017-08-04 DIAGNOSIS — T8142XA Infection following a procedure, deep incisional surgical site, initial encounter: Secondary | ICD-10-CM | POA: Diagnosis not present

## 2017-08-04 DIAGNOSIS — J309 Allergic rhinitis, unspecified: Secondary | ICD-10-CM | POA: Diagnosis not present

## 2017-08-04 DIAGNOSIS — K529 Noninfective gastroenteritis and colitis, unspecified: Secondary | ICD-10-CM | POA: Diagnosis not present

## 2017-08-04 DIAGNOSIS — K219 Gastro-esophageal reflux disease without esophagitis: Secondary | ICD-10-CM | POA: Diagnosis not present

## 2017-08-04 DIAGNOSIS — E538 Deficiency of other specified B group vitamins: Secondary | ICD-10-CM | POA: Diagnosis not present

## 2017-08-04 DIAGNOSIS — Z87891 Personal history of nicotine dependence: Secondary | ICD-10-CM

## 2017-08-04 DIAGNOSIS — F419 Anxiety disorder, unspecified: Secondary | ICD-10-CM

## 2017-08-04 DIAGNOSIS — Z432 Encounter for attention to ileostomy: Secondary | ICD-10-CM

## 2017-08-04 DIAGNOSIS — Z9049 Acquired absence of other specified parts of digestive tract: Secondary | ICD-10-CM

## 2017-08-04 DIAGNOSIS — M545 Low back pain: Secondary | ICD-10-CM

## 2017-08-04 DIAGNOSIS — M799 Soft tissue disorder, unspecified: Secondary | ICD-10-CM

## 2017-08-04 DIAGNOSIS — Z8701 Personal history of pneumonia (recurrent): Secondary | ICD-10-CM

## 2017-08-04 DIAGNOSIS — D631 Anemia in chronic kidney disease: Secondary | ICD-10-CM | POA: Diagnosis not present

## 2017-08-18 ENCOUNTER — Other Ambulatory Visit (INDEPENDENT_AMBULATORY_CARE_PROVIDER_SITE_OTHER): Payer: Medicare Other

## 2017-08-18 ENCOUNTER — Ambulatory Visit: Payer: Self-pay | Admitting: *Deleted

## 2017-08-18 ENCOUNTER — Encounter: Payer: Self-pay | Admitting: Internal Medicine

## 2017-08-18 ENCOUNTER — Ambulatory Visit (INDEPENDENT_AMBULATORY_CARE_PROVIDER_SITE_OTHER): Payer: Medicare Other | Admitting: Internal Medicine

## 2017-08-18 VITALS — BP 126/82 | HR 82 | Temp 98.0°F | Ht 65.0 in | Wt 203.0 lb

## 2017-08-18 DIAGNOSIS — M150 Primary generalized (osteo)arthritis: Secondary | ICD-10-CM | POA: Diagnosis not present

## 2017-08-18 DIAGNOSIS — K50118 Crohn's disease of large intestine with other complication: Secondary | ICD-10-CM | POA: Diagnosis not present

## 2017-08-18 DIAGNOSIS — T8149XA Infection following a procedure, other surgical site, initial encounter: Secondary | ICD-10-CM

## 2017-08-18 DIAGNOSIS — M109 Gout, unspecified: Secondary | ICD-10-CM | POA: Diagnosis not present

## 2017-08-18 DIAGNOSIS — N183 Chronic kidney disease, stage 3 unspecified: Secondary | ICD-10-CM

## 2017-08-18 DIAGNOSIS — M159 Polyosteoarthritis, unspecified: Secondary | ICD-10-CM

## 2017-08-18 LAB — BASIC METABOLIC PANEL
BUN: 35 mg/dL — AB (ref 6–23)
CHLORIDE: 108 meq/L (ref 96–112)
CO2: 23 meq/L (ref 19–32)
CREATININE: 1.14 mg/dL (ref 0.40–1.20)
Calcium: 10 mg/dL (ref 8.4–10.5)
GFR: 62.13 mL/min (ref >60.00)
Glucose, Bld: 93 mg/dL (ref 70–99)
Potassium: 4.3 mEq/L (ref 3.5–5.1)
Sodium: 140 mEq/L (ref 135–145)

## 2017-08-18 LAB — URIC ACID: Uric Acid, Serum: 7.6 mg/dL — ABNORMAL HIGH (ref 2.4–7.0)

## 2017-08-18 MED ORDER — COLCHICINE 0.6 MG PO TABS: 0.6000 mg | ORAL_TABLET | Freq: Three times a day (TID) | ORAL | 3 refills | Status: DC | PRN

## 2017-08-18 MED ORDER — FEBUXOSTAT 40 MG PO TABS: 40.0000 mg | ORAL_TABLET | Freq: Every day | ORAL | 11 refills | Status: DC

## 2017-08-18 MED ORDER — METHYLPREDNISOLONE ACETATE 80 MG/ML IJ SUSP
80.0000 mg | Freq: Once | INTRAMUSCULAR | Status: AC
  Administered 2017-08-18: 80 mg via INTRAMUSCULAR

## 2017-08-18 NOTE — Progress Notes (Signed)
Subjective:  Patient ID: Emily Sanchez, female    DOB: 04/05/1956  Age: 62 y.o. MRN: [id number]  CC: No chief complaint on file.   HPI Emily Sanchez presents for severe L foot pain x 4 days  Outpatient Medications Prior to Visit  Medication Sig Dispense Refill  . BD INTEGRA SYRINGE 25G X 1" 3 ML MISC TO BE USED FOR B12 INJECTIONS 50 each 3  . cholecalciferol (VITAMIN D) 1000 units tablet Take 1,000 Units by mouth daily.    . cyanocobalamin (,VITAMIN B-12,) 1000 MCG/ML injection Inject 1 mL (1,000 mcg total) into the muscle every 14 (fourteen) days. 20 mL 3  . fluconazole (DIFLUCAN) 100 MG tablet     . hydrochlorothiazide (MICROZIDE) 12.5 MG capsule Take 1 capsule (12.5 mg total) by mouth daily. 90 capsule 0  . Multiple Vitamin (MULTIVITAMIN WITH MINERALS) TABS tablet Take 1 tablet by mouth daily.    . ondansetron (ZOFRAN) 4 MG tablet Take 1 tablet (4 mg total) by mouth every 8 (eight) hours as needed for nausea or vomiting. 20 tablet 0  . pantoprazole (PROTONIX) 40 MG tablet Take 1 tablet (40 mg total) by mouth daily. 90 tablet 3  . SYRINGE-NEEDLE, DISP, 3 ML (B-D INTEGRA SYRINGE) 23G X 1" 3 ML MISC 1 Device by Does not apply route every 30 (thirty) days. For B12 shots 50 each 3   No facility-administered medications prior to visit.     ROS Review of Systems  Constitutional: Positive for fatigue. Negative for activity change, appetite change, chills, diaphoresis, fever and unexpected weight change.  HENT: Negative for congestion, ear pain, facial swelling, hearing loss, mouth sores, nosebleeds, postnasal drip, rhinorrhea, sinus pressure, sneezing, sore throat, tinnitus and trouble swallowing.   Eyes: Negative for pain, discharge, redness, itching and visual disturbance.  Respiratory: Negative for cough, chest tightness, shortness of breath, wheezing and stridor.   Cardiovascular: Negative for chest pain, palpitations and leg swelling.  Gastrointestinal: Negative for abdominal  distention, anal bleeding, blood in stool, constipation, diarrhea, nausea, rectal pain and vomiting.  Genitourinary: Negative for difficulty urinating, dysuria, flank pain, frequency, genital sores, hematuria, pelvic pain, urgency, vaginal bleeding and vaginal discharge.  Musculoskeletal: Positive for arthralgias, gait problem, joint swelling and myalgias. Negative for back pain, neck pain and neck stiffness.  Skin: Positive for color change and wound. Negative for rash.  Neurological: Positive for weakness. Negative for dizziness, tremors, seizures, syncope, speech difficulty, numbness and headaches.  Hematological: Negative for adenopathy. Does not bruise/bleed easily.  Psychiatric/Behavioral: Positive for dysphoric mood and sleep disturbance. Negative for behavioral problems, decreased concentration and suicidal ideas. The patient is nervous/anxious.     Objective:  BP 126/82 (BP Location: Left Arm, Patient Position: Sitting, Cuff Size: Large)   Pulse 82   Temp 98 F (36.7 C) (Oral)   Ht 5' 5"  (1.651 m)   Wt 203 lb (92.1 kg)   SpO2 99%   BMI 33.78 kg/m   BP Readings from Last 3 Encounters:  08/18/17 126/82  07/09/17 132/72  06/26/17 114/78    Wt Readings from Last 3 Encounters:  08/18/17 203 lb (92.1 kg)  07/09/17 204 lb (92.5 kg)  06/21/17 218 lb (98.9 kg)    Physical Exam  Constitutional: She appears well-developed. No distress.  HENT:  Head: Normocephalic.  Right Ear: External ear normal.  Left Ear: External ear normal.  Nose: Nose normal.  Mouth/Throat: Oropharynx is clear and moist.  Eyes: Pupils are equal, round, and reactive to light. Conjunctivae are normal.  Right eye exhibits no discharge. Left eye exhibits no discharge.  Neck: Normal range of motion. Neck supple. No JVD present. No tracheal deviation present. No thyromegaly present.  Cardiovascular: Normal rate, regular rhythm and normal heart sounds.  Pulmonary/Chest: No stridor. No respiratory distress. She  has no wheezes.  Abdominal: Soft. Bowel sounds are normal. She exhibits no distension and no mass. There is no tenderness. There is no rebound and no guarding.  Musculoskeletal: She exhibits edema and tenderness.  Lymphadenopathy:    She has no cervical adenopathy.  Neurological: She displays normal reflexes. No cranial nerve deficit. She exhibits normal muscle tone. Coordination abnormal.  Skin: No rash noted. There is erythema.  Psychiatric: Her behavior is normal. Judgment and thought content normal.   Last 1 st MTP is swollen, tender, red  Lab Results  Component Value Date   WBC 7.2 06/26/2017   HGB 9.0 (L) 06/26/2017   HCT 28.4 (L) 06/26/2017   PLT 493 (H) 06/26/2017   GLUCOSE 91 06/26/2017   CHOL 172 05/27/2010   TRIG 100.0 05/27/2010   HDL 46.30 05/27/2010   LDLCALC 106 (H) 05/27/2010   ALT 17 06/22/2017   AST 18 06/22/2017   NA 139 06/26/2017   K 3.4 (L) 06/26/2017   CL 106 06/26/2017   CREATININE 1.19 (H) 06/26/2017   BUN 19 06/26/2017   CO2 25 06/26/2017   TSH 0.54 02/21/2016   INR 0.96 06/09/2017   HGBA1C 5.9 12/17/2009   MICROALBUR 0.6 02/10/2013    Ct Abdomen Pelvis W Contrast  Result Date: 06/22/2017 CLINICAL DATA:  62 y/o F; recent bowel surgery 06/11/2017 with removal of scar tissue and mass around ileostomy. History of Crohn's colitis. Nausea, low-grade fever, and leukocytosis. EXAM: CT ABDOMEN AND PELVIS WITH CONTRAST TECHNIQUE: Multidetector CT imaging of the abdomen and pelvis was performed using the standard protocol following bolus administration of intravenous contrast. CONTRAST:  165m ISOVUE-300 IOPAMIDOL (ISOVUE-300) INJECTION 61% COMPARISON:  06/08/2017 CT abdomen and pelvis. FINDINGS: Lower chest: 3 mm left lower lobe perifissural nodule, likely intrapulmonary lymph node, is stable. Hepatobiliary: Stable subcentimeter lucencies in the liver, likely cysts. Decompressed gallbladder. No biliary dilatation. Pancreas: Unremarkable. No pancreatic ductal  dilatation or surrounding inflammatory changes. Spleen: Normal in size without focal abnormality. Adrenals/Urinary Tract: Adrenal glands are unremarkable. Multiple cortical scars in the kidneys bilaterally. Kidneys are otherwise normal, without renal calculi, focal lesion, or hydronephrosis. Bladder is unremarkable. Stomach/Bowel: Partial gastrectomy. Mild wall thickening of small bowel in the mid abdomen with interval decompression. Colectomy. Vascular/Lymphatic: Aortic atherosclerosis. No enlarged abdominal or pelvic lymph nodes. 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 Other: Midline anterior abdominal wall fluid collection centered in subcutaneous fat with small intraperitoneal component measuring 8.8 x 6.5 x 15.9 cm (AP x ML x CC series 2, image 58 and series 7, image 72). Collection in the right lower quadrant ileostomy measuring 1.8 x 10.5 x 7.8 cm (series 6, image 26 and series 7, image 37) centered in subcutaneous fat. Fluid collections demonstrate mild peripheral enhancement. Mild edema in the mesenteric. Musculoskeletal: No fracture is seen. Grade 1 L4-5 anterolisthesis and lower lumbar facet hypertrophy. IMPRESSION: 1. Large fluid collections centered in subcutaneous fat of the midline anterior abdominal wall and surrounding the ileostomy with mild rim enhancement which may represent infection. 2. Mild wall thickening of small bowel in the mid abdomen. Mild mesenteric edema. Inflammation may be due to recent surgery, history of Crohn's, or infection. Electronically Signed   By: LMia Creek  Furusawa-Stratton M.D.   On: 06/22/2017 14:09    Assessment & Plan:   There are no diagnoses linked to this encounter. I am having Emily Sanchez maintain her multivitamin with minerals, SYRINGE-NEEDLE (DISP) 3 ML, BD INTEGRA SYRINGE, pantoprazole, cholecalciferol, ondansetron, hydrochlorothiazide, fluconazole, and cyanocobalamin.  No orders of the defined types were placed in this  encounter.    Follow-up: No follow-ups on file.  Walker Kehr, MD

## 2017-08-18 NOTE — Assessment & Plan Note (Signed)
BMET Will have to use Uloric

## 2017-08-18 NOTE — Assessment & Plan Note (Signed)
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

## 2017-08-18 NOTE — Assessment & Plan Note (Signed)
Wt Readings from Last 3 Encounters:  08/18/17 203 lb (92.1 kg)  07/09/17 204 lb (92.5 kg)  06/21/17 218 lb (98.9 kg)

## 2017-08-18 NOTE — Assessment & Plan Note (Signed)
4/19 L 1st MTP Depo-medrol Uloric  Colchicine prn Labs

## 2017-08-18 NOTE — Assessment & Plan Note (Signed)
abd wound is closing

## 2017-08-18 NOTE — Patient Instructions (Signed)

## 2017-08-18 NOTE — Assessment & Plan Note (Signed)
Better - healing

## 2017-08-18 NOTE — Telephone Encounter (Signed)
Patient is calling and states she has gout in her toes, states this is new to her. She requested a prescription, I informed patient she would have to be seen for this matter first. Patient does not want a appt she would like a nurse to call her with home remedies.  Patient states she is hurting so bad she can't walk- she post- op surgery. Patient does have an upcoming appointment- encouraged patient to come to office for evaluation- there are wheelchairs available for use. Appointment scheduled.  Reason for Disposition . [1] SEVERE pain (e.g., excruciating, unable to do any normal activities) AND [2] not improved after 2 hours of pain medicine  Answer Assessment - Initial Assessment Questions 1. ONSET: "When did the pain start?"      Second round- 5 weeks ago- and then saturday 2. LOCATION: "Where is the pain located?"   (e.g., around nail, entire toe, at foot joint)      L- great toe and second toe 3. PAIN: "How bad is the pain?"    (Scale 1-10; or mild, moderate, severe)   -  MILD (1-3): doesn't interfere with normal activities    -  MODERATE (4-7): interferes with normal activities (e.g., work or school) or awakens from sleep, limping    -  SEVERE (8-10): excruciating pain, unable to do any normal activities, unable to walk     Severe pain 4. APPEARANCE: "What does the toe look like?" (e.g., redness, swelling, bruising, pallor)     Red,warm and swollen 5. CAUSE: "What do you think is causing the toe pain?"     Possible gout 6. OTHER SYMPTOMS: "Do you have any other symptoms?" (e.g., leg pain, rash, fever, numbness)     No other symptome 7. PREGNANCY: "Is there any chance you are pregnant?" "When was your last menstrual period?"     n/a  Protocols used: TOE PAIN-A-AH

## 2017-09-08 ENCOUNTER — Encounter: Payer: Self-pay | Admitting: Internal Medicine

## 2017-09-08 ENCOUNTER — Ambulatory Visit (INDEPENDENT_AMBULATORY_CARE_PROVIDER_SITE_OTHER): Payer: Medicare Other | Admitting: Internal Medicine

## 2017-09-08 DIAGNOSIS — T8149XA Infection following a procedure, other surgical site, initial encounter: Secondary | ICD-10-CM | POA: Diagnosis not present

## 2017-09-08 DIAGNOSIS — M109 Gout, unspecified: Secondary | ICD-10-CM | POA: Diagnosis not present

## 2017-09-08 DIAGNOSIS — E538 Deficiency of other specified B group vitamins: Secondary | ICD-10-CM

## 2017-09-08 MED ORDER — "SYRINGE/NEEDLE (DISP) 25G X 1"" 3 ML MISC": 3 refills | Status: DC

## 2017-09-08 MED ORDER — PREDNISONE 10 MG PO TABS: ORAL_TABLET | ORAL | 1 refills | Status: DC

## 2017-09-08 MED ORDER — CYANOCOBALAMIN 1000 MCG/ML IJ SOLN: 1000.0000 ug | INTRAMUSCULAR | 3 refills | Status: DC

## 2017-09-08 NOTE — Assessment & Plan Note (Signed)
On B12 

## 2017-09-08 NOTE — Assessment & Plan Note (Addendum)
Cont Uloric Hold colchicine Prednisone 10 mg: take 4 tabs a day x 3 days; then 3 tabs a day x 4 days; then 2 tabs a day x 4 days, then 1 tab a day x 4 days, then stop. Take pc.   Potential benefits of a steroid  use as well as potential risks  and complications were explained to the patient and were aknowledged.

## 2017-09-08 NOTE — Progress Notes (Signed)
Subjective:  Patient ID: Emily Sanchez, female    DOB: December 27, 1955  Age: 62 y.o. MRN: [id number]  CC: No chief complaint on file.   HPI Emily Sanchez presents for L foot gout Not better w/Uloric and Colchicine F/u B12 def, abd wound   Outpatient Medications Prior to Visit  Medication Sig Dispense Refill  . cholecalciferol (VITAMIN D) 1000 units tablet Take 1,000 Units by mouth daily.    . colchicine 0.6 MG tablet Take 1 tablet (0.6 mg total) by mouth 3 (three) times daily as needed. 60 tablet 3  . febuxostat (ULORIC) 40 MG tablet Take 1 tablet (40 mg total) by mouth daily. 30 tablet 11  . hydrochlorothiazide (MICROZIDE) 12.5 MG capsule Take 1 capsule (12.5 mg total) by mouth daily. 90 capsule 0  . Multiple Vitamin (MULTIVITAMIN WITH MINERALS) TABS tablet Take 1 tablet by mouth daily.    . pantoprazole (PROTONIX) 40 MG tablet Take 1 tablet (40 mg total) by mouth daily. 90 tablet 3  . BD INTEGRA SYRINGE 25G X 1" 3 ML MISC TO BE USED FOR B12 INJECTIONS 50 each 3  . cyanocobalamin (,VITAMIN B-12,) 1000 MCG/ML injection Inject 1 mL (1,000 mcg total) into the muscle every 14 (fourteen) days. 20 mL 3  . fluconazole (DIFLUCAN) 100 MG tablet     . ondansetron (ZOFRAN) 4 MG tablet Take 1 tablet (4 mg total) by mouth every 8 (eight) hours as needed for nausea or vomiting. (Patient not taking: Reported on 09/08/2017) 20 tablet 0  . SYRINGE-NEEDLE, DISP, 3 ML (B-D INTEGRA SYRINGE) 23G X 1" 3 ML MISC 1 Device by Does not apply route every 30 (thirty) days. For B12 shots 50 each 3   No facility-administered medications prior to visit.     ROS Review of Systems  Constitutional: Negative for activity change, appetite change, chills, fatigue and unexpected weight change.  HENT: Negative for congestion, mouth sores and sinus pressure.   Eyes: Negative for visual disturbance.  Respiratory: Negative for cough and chest tightness.   Gastrointestinal: Negative for abdominal pain and nausea.    Genitourinary: Negative for difficulty urinating, frequency and vaginal pain.  Musculoskeletal: Positive for arthralgias, back pain and gait problem.  Skin: Negative for pallor and rash.  Neurological: Negative for dizziness, tremors, weakness, numbness and headaches.  Psychiatric/Behavioral: Negative for confusion and sleep disturbance.    Objective:  BP 126/82 (BP Location: Left Arm, Patient Position: Sitting, Cuff Size: Large)   Pulse 85   Temp 98.1 F (36.7 C) (Oral)   Ht 5' 5"  (1.651 m)   Wt 206 lb (93.4 kg)   SpO2 97%   BMI 34.28 kg/m   BP Readings from Last 3 Encounters:  09/08/17 126/82  08/18/17 126/82  07/09/17 132/72    Wt Readings from Last 3 Encounters:  09/08/17 206 lb (93.4 kg)  08/18/17 203 lb (92.1 kg)  07/09/17 204 lb (92.5 kg)    Physical Exam  Constitutional: She appears well-developed. No distress.  HENT:  Head: Normocephalic.  Right Ear: External ear normal.  Left Ear: External ear normal.  Nose: Nose normal.  Mouth/Throat: Oropharynx is clear and moist.  Eyes: Pupils are equal, round, and reactive to light. Conjunctivae are normal. Right eye exhibits no discharge. Left eye exhibits no discharge.  Neck: Normal range of motion. Neck supple. No JVD present. No tracheal deviation present. No thyromegaly present.  Cardiovascular: Normal rate, regular rhythm and normal heart sounds.  Pulmonary/Chest: No stridor. No respiratory distress. She has no wheezes.  Abdominal: Soft. Bowel sounds are normal. She exhibits no distension and no mass. There is no rebound and no guarding.  Musculoskeletal: She exhibits tenderness. She exhibits no edema.  Lymphadenopathy:    She has no cervical adenopathy.  Neurological: She displays normal reflexes. No cranial nerve deficit. She exhibits normal muscle tone. Coordination normal.  Skin: No rash noted. No erythema.  Psychiatric: She has a normal mood and affect. Her behavior is normal. Judgment and thought content  normal.   L st MTP swollen, warm, painful, purple Colostomy abd scar  Lab Results  Component Value Date   WBC 7.2 06/26/2017   HGB 9.0 (L) 06/26/2017   HCT 28.4 (L) 06/26/2017   PLT 493 (H) 06/26/2017   GLUCOSE 93 08/18/2017   CHOL 172 05/27/2010   TRIG 100.0 05/27/2010   HDL 46.30 05/27/2010   LDLCALC 106 (H) 05/27/2010   ALT 17 06/22/2017   AST 18 06/22/2017   NA 140 08/18/2017   K 4.3 08/18/2017   CL 108 08/18/2017   CREATININE 1.14 08/18/2017   BUN 35 (H) 08/18/2017   CO2 23 08/18/2017   TSH 0.54 02/21/2016   INR 0.96 06/09/2017   HGBA1C 5.9 12/17/2009   MICROALBUR 0.6 02/10/2013    Ct Abdomen Pelvis W Contrast  Result Date: 06/22/2017 CLINICAL DATA:  62 y/o F; recent bowel surgery 06/11/2017 with removal of scar tissue and mass around ileostomy. History of Crohn's colitis. Nausea, low-grade fever, and leukocytosis. EXAM: CT ABDOMEN AND PELVIS WITH CONTRAST TECHNIQUE: Multidetector CT imaging of the abdomen and pelvis was performed using the standard protocol following bolus administration of intravenous contrast. CONTRAST:  159m ISOVUE-300 IOPAMIDOL (ISOVUE-300) INJECTION 61% COMPARISON:  06/08/2017 CT abdomen and pelvis. FINDINGS: Lower chest: 3 mm left lower lobe perifissural nodule, likely intrapulmonary lymph node, is stable. Hepatobiliary: Stable subcentimeter lucencies in the liver, likely cysts. Decompressed gallbladder. No biliary dilatation. Pancreas: Unremarkable. No pancreatic ductal dilatation or surrounding inflammatory changes. Spleen: Normal in size without focal abnormality. Adrenals/Urinary Tract: Adrenal glands are unremarkable. Multiple cortical scars in the kidneys bilaterally. Kidneys are otherwise normal, without renal calculi, focal lesion, or hydronephrosis. Bladder is unremarkable. Stomach/Bowel: Partial gastrectomy. Mild wall thickening of small bowel in the mid abdomen with interval decompression. Colectomy. Vascular/Lymphatic: Aortic atherosclerosis.  No enlarged abdominal or pelvic lymph nodes. 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 Other: Midline anterior abdominal wall fluid collection centered in subcutaneous fat with small intraperitoneal component measuring 8.8 x 6.5 x 15.9 cm (AP x ML x CC series 2, image 58 and series 7, image 72). Collection in the right lower quadrant ileostomy measuring 1.8 x 10.5 x 7.8 cm (series 6, image 26 and series 7, image 37) centered in subcutaneous fat. Fluid collections demonstrate mild peripheral enhancement. Mild edema in the mesenteric. Musculoskeletal: No fracture is seen. Grade 1 L4-5 anterolisthesis and lower lumbar facet hypertrophy. IMPRESSION: 1. Large fluid collections centered in subcutaneous fat of the midline anterior abdominal wall and surrounding the ileostomy with mild rim enhancement which may represent infection. 2. Mild wall thickening of small bowel in the mid abdomen. Mild mesenteric edema. Inflammation may be due to recent surgery, history of Crohn's, or infection. Electronically Signed   By: LKristine GarbeM.D.   On: 06/22/2017 14:09    Assessment & Plan:   There are no diagnoses linked to this encounter. I have discontinued Dekayla Pressly's SYRINGE-NEEDLE (DISP) 3 ML, ondansetron, and fluconazole. I have also changed her BD INTEGRA SYRINGE to SYRINGE-NEEDLE (  DISP) 3 ML. Additionally, I am having her maintain her multivitamin with minerals, pantoprazole, cholecalciferol, hydrochlorothiazide, febuxostat, colchicine, and cyanocobalamin.  Meds ordered this encounter  Medications  . cyanocobalamin (,VITAMIN B-12,) 1000 MCG/ML injection    Sig: Inject 1 mL (1,000 mcg total) into the muscle every 14 (fourteen) days.    Dispense:  20 mL    Refill:  3  . SYRINGE-NEEDLE, DISP, 3 ML (BD INTEGRA SYRINGE) 25G X 1" 3 ML MISC    Sig: TO BE USED FOR B12 INJECTIONS    Dispense:  50 each    Refill:  3     Follow-up: No follow-ups on file.  Walker Kehr, MD

## 2017-09-08 NOTE — Assessment & Plan Note (Signed)
Doing well 

## 2017-09-08 NOTE — Assessment & Plan Note (Signed)
Wt Readings from Last 3 Encounters:  09/08/17 206 lb (93.4 kg)  08/18/17 203 lb (92.1 kg)  07/09/17 204 lb (92.5 kg)

## 2017-09-08 NOTE — Assessment & Plan Note (Signed)
Vit D 

## 2017-10-14 ENCOUNTER — Other Ambulatory Visit: Payer: Self-pay | Admitting: Internal Medicine

## 2017-11-10 DIAGNOSIS — M1712 Unilateral primary osteoarthritis, left knee: Secondary | ICD-10-CM | POA: Diagnosis not present

## 2017-11-10 DIAGNOSIS — M1711 Unilateral primary osteoarthritis, right knee: Secondary | ICD-10-CM | POA: Diagnosis not present

## 2017-12-08 ENCOUNTER — Ambulatory Visit: Payer: Self-pay | Admitting: Internal Medicine

## 2017-12-28 ENCOUNTER — Ambulatory Visit (INDEPENDENT_AMBULATORY_CARE_PROVIDER_SITE_OTHER): Payer: Medicare Other | Admitting: Internal Medicine

## 2017-12-28 ENCOUNTER — Encounter: Payer: Self-pay | Admitting: Internal Medicine

## 2017-12-28 VITALS — BP 128/84 | HR 79 | Temp 98.4°F | Ht 65.0 in | Wt 214.0 lb

## 2017-12-28 DIAGNOSIS — M5440 Lumbago with sciatica, unspecified side: Secondary | ICD-10-CM | POA: Diagnosis not present

## 2017-12-28 DIAGNOSIS — G8929 Other chronic pain: Secondary | ICD-10-CM | POA: Diagnosis not present

## 2017-12-28 DIAGNOSIS — N183 Chronic kidney disease, stage 3 unspecified: Secondary | ICD-10-CM

## 2017-12-28 DIAGNOSIS — T148XXA Other injury of unspecified body region, initial encounter: Secondary | ICD-10-CM

## 2017-12-28 DIAGNOSIS — K50118 Crohn's disease of large intestine with other complication: Secondary | ICD-10-CM

## 2017-12-28 DIAGNOSIS — M109 Gout, unspecified: Secondary | ICD-10-CM | POA: Diagnosis not present

## 2017-12-28 DIAGNOSIS — I10 Essential (primary) hypertension: Secondary | ICD-10-CM | POA: Diagnosis not present

## 2017-12-28 MED ORDER — METHYLPREDNISOLONE ACETATE 80 MG/ML IJ SUSP
80.0000 mg | Freq: Once | INTRAMUSCULAR | Status: AC
  Administered 2017-12-28: 80 mg via INTRAMUSCULAR

## 2017-12-28 MED ORDER — TRAMADOL HCL 50 MG PO TABS: 50.0000 mg | ORAL_TABLET | Freq: Four times a day (QID) | ORAL | 0 refills | Status: DC | PRN

## 2017-12-28 MED ORDER — DICLOFENAC SODIUM 1 % TD GEL: 1.0000 "application " | Freq: Four times a day (QID) | TRANSDERMAL | 3 refills | Status: DC

## 2017-12-28 MED ORDER — COLCHICINE 0.6 MG PO TABS: 0.6000 mg | ORAL_TABLET | Freq: Every day | ORAL | 3 refills | Status: DC

## 2017-12-28 MED ORDER — ALLOPURINOL 100 MG PO TABS: 100.0000 mg | ORAL_TABLET | Freq: Every day | ORAL | 3 refills | Status: DC

## 2017-12-28 NOTE — Assessment & Plan Note (Addendum)
Refractory  Depo-medrol 80 mg Uloric - d/c Low dose Allopurinol Daily colchicine Tramadol prn

## 2017-12-28 NOTE — Assessment & Plan Note (Signed)
Wt Readings from Last 3 Encounters:  12/28/17 214 lb (97.1 kg)  09/08/17 206 lb (93.4 kg)  08/18/17 203 lb (92.1 kg)

## 2017-12-28 NOTE — Assessment & Plan Note (Signed)
BMET 

## 2017-12-28 NOTE — Assessment & Plan Note (Signed)
ESR Colostomy

## 2017-12-28 NOTE — Progress Notes (Signed)
Subjective:  Patient ID: Emily Sanchez, female    DOB: 1955-08-05  Age: 62 y.o. MRN: [id number]  CC: No chief complaint on file.   HPI Emily Sanchez presents for gout pain in the L 1st MTP - not better C/o bruising F/u anemia F/u B12 def, GERD  Outpatient Medications Prior to Visit  Medication Sig Dispense Refill  . cholecalciferol (VITAMIN D) 1000 units tablet Take 1,000 Units by mouth daily.    . colchicine 0.6 MG tablet Take 1 tablet (0.6 mg total) by mouth 3 (three) times daily as needed. 60 tablet 3  . cyanocobalamin (,VITAMIN B-12,) 1000 MCG/ML injection Inject 1 mL (1,000 mcg total) into the muscle every 14 (fourteen) days. 20 mL 3  . febuxostat (ULORIC) 40 MG tablet Take 1 tablet (40 mg total) by mouth daily. 30 tablet 11  . hydrochlorothiazide (MICROZIDE) 12.5 MG capsule Take 1 capsule (12.5 mg total) by mouth daily. 90 capsule 0  . Multiple Vitamin (MULTIVITAMIN WITH MINERALS) TABS tablet Take 1 tablet by mouth daily.    . pantoprazole (PROTONIX) 40 MG tablet TAKE 1 TABLET BY MOUTH EVERY DAY 90 tablet 3  . predniSONE (DELTASONE) 10 MG tablet Prednisone 10 mg: take 4 tabs a day x 3 days; then 3 tabs a day x 4 days; then 2 tabs a day x 4 days, then 1 tab a day x 4 days, then stop. Take pc. For gout 36 tablet 1  . SYRINGE-NEEDLE, DISP, 3 ML (BD INTEGRA SYRINGE) 25G X 1" 3 ML MISC TO BE USED FOR B12 INJECTIONS 50 each 3   No facility-administered medications prior to visit.     ROS: Review of Systems  Constitutional: Positive for fatigue and unexpected weight change. Negative for activity change, appetite change and chills.  HENT: Negative for congestion, mouth sores and sinus pressure.   Eyes: Negative for visual disturbance.  Respiratory: Negative for cough and chest tightness.   Gastrointestinal: Positive for abdominal pain. Negative for nausea.  Genitourinary: Negative for difficulty urinating, frequency and vaginal pain.  Musculoskeletal: Positive for arthralgias and  gait problem. Negative for back pain.  Skin: Negative for pallor and rash.  Neurological: Negative for dizziness, tremors, weakness, numbness and headaches.  Hematological: Bruises/bleeds easily.  Psychiatric/Behavioral: Positive for sleep disturbance. Negative for confusion and suicidal ideas. The patient is nervous/anxious.     Objective:  BP 128/84 (BP Location: Left Arm, Patient Position: Sitting, Cuff Size: Large)   Pulse 79   Temp 98.4 F (36.9 C) (Oral)   Ht 5' 5"  (1.651 m)   Wt 214 lb (97.1 kg)   SpO2 99%   BMI 35.61 kg/m   BP Readings from Last 3 Encounters:  12/28/17 128/84  09/08/17 126/82  08/18/17 126/82    Wt Readings from Last 3 Encounters:  12/28/17 214 lb (97.1 kg)  09/08/17 206 lb (93.4 kg)  08/18/17 203 lb (92.1 kg)    Physical Exam  Constitutional: She appears well-developed. No distress.  HENT:  Head: Normocephalic.  Right Ear: External ear normal.  Left Ear: External ear normal.  Nose: Nose normal.  Mouth/Throat: Oropharynx is clear and moist.  Eyes: Pupils are equal, round, and reactive to light. Conjunctivae are normal. Right eye exhibits no discharge. Left eye exhibits no discharge.  Neck: Normal range of motion. Neck supple. No JVD present. No tracheal deviation present. No thyromegaly present.  Cardiovascular: Normal rate, regular rhythm and normal heart sounds.  Pulmonary/Chest: No stridor. No respiratory distress. She has no wheezes.  Abdominal: Soft. Bowel sounds are normal. She exhibits no distension and no mass. There is no tenderness. There is no rebound and no guarding.  Musculoskeletal: She exhibits tenderness. She exhibits no edema.  Lymphadenopathy:    She has no cervical adenopathy.  Neurological: She displays normal reflexes. No cranial nerve deficit. She exhibits normal muscle tone. Coordination normal.  Skin: No rash noted. No erythema.  Psychiatric: She has a normal mood and affect. Her behavior is normal. Judgment and thought  content normal.  bruises on legs 1st L MTP tender   Lab Results  Component Value Date   WBC 7.2 06/26/2017   HGB 9.0 (L) 06/26/2017   HCT 28.4 (L) 06/26/2017   PLT 493 (H) 06/26/2017   GLUCOSE 93 08/18/2017   CHOL 172 05/27/2010   TRIG 100.0 05/27/2010   HDL 46.30 05/27/2010   LDLCALC 106 (H) 05/27/2010   ALT 17 06/22/2017   AST 18 06/22/2017   NA 140 08/18/2017   K 4.3 08/18/2017   CL 108 08/18/2017   CREATININE 1.14 08/18/2017   BUN 35 (H) 08/18/2017   CO2 23 08/18/2017   TSH 0.54 02/21/2016   INR 0.96 06/09/2017   HGBA1C 5.9 12/17/2009   MICROALBUR 0.6 02/10/2013    Ct Abdomen Pelvis W Contrast  Result Date: 06/22/2017 CLINICAL DATA:  62 y/o F; recent bowel surgery 06/11/2017 with removal of scar tissue and mass around ileostomy. History of Crohn's colitis. Nausea, low-grade fever, and leukocytosis. EXAM: CT ABDOMEN AND PELVIS WITH CONTRAST TECHNIQUE: Multidetector CT imaging of the abdomen and pelvis was performed using the standard protocol following bolus administration of intravenous contrast. CONTRAST:  14m ISOVUE-300 IOPAMIDOL (ISOVUE-300) INJECTION 61% COMPARISON:  06/08/2017 CT abdomen and pelvis. FINDINGS: Lower chest: 3 mm left lower lobe perifissural nodule, likely intrapulmonary lymph node, is stable. Hepatobiliary: Stable subcentimeter lucencies in the liver, likely cysts. Decompressed gallbladder. No biliary dilatation. Pancreas: Unremarkable. No pancreatic ductal dilatation or surrounding inflammatory changes. Spleen: Normal in size without focal abnormality. Adrenals/Urinary Tract: Adrenal glands are unremarkable. Multiple cortical scars in the kidneys bilaterally. Kidneys are otherwise normal, without renal calculi, focal lesion, or hydronephrosis. Bladder is unremarkable. Stomach/Bowel: Partial gastrectomy. Mild wall thickening of small bowel in the mid abdomen with interval decompression. Colectomy. Vascular/Lymphatic: Aortic atherosclerosis. No enlarged  abdominal or pelvic lymph nodes. 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 Other: Midline anterior abdominal wall fluid collection centered in subcutaneous fat with small intraperitoneal component measuring 8.8 x 6.5 x 15.9 cm (AP x ML x CC series 2, image 58 and series 7, image 72). Collection in the right lower quadrant ileostomy measuring 1.8 x 10.5 x 7.8 cm (series 6, image 26 and series 7, image 37) centered in subcutaneous fat. Fluid collections demonstrate mild peripheral enhancement. Mild edema in the mesenteric. Musculoskeletal: No fracture is seen. Grade 1 L4-5 anterolisthesis and lower lumbar facet hypertrophy. IMPRESSION: 1. Large fluid collections centered in subcutaneous fat of the midline anterior abdominal wall and surrounding the ileostomy with mild rim enhancement which may represent infection. 2. Mild wall thickening of small bowel in the mid abdomen. Mild mesenteric edema. Inflammation may be due to recent surgery, history of Crohn's, or infection. Electronically Signed   By: LKristine GarbeM.D.   On: 06/22/2017 14:09    Assessment & Plan:   There are no diagnoses linked to this encounter.   No orders of the defined types were placed in this encounter.    Follow-up: No follow-ups on file.  Walker Kehr, MD

## 2017-12-28 NOTE — Assessment & Plan Note (Signed)
BP Readings from Last 3 Encounters:  12/28/17 128/84  09/08/17 126/82  08/18/17 126/82

## 2017-12-28 NOTE — Assessment & Plan Note (Signed)
Tramadol prn

## 2018-01-05 ENCOUNTER — Other Ambulatory Visit (INDEPENDENT_AMBULATORY_CARE_PROVIDER_SITE_OTHER): Payer: Medicare Other

## 2018-01-05 DIAGNOSIS — K50118 Crohn's disease of large intestine with other complication: Secondary | ICD-10-CM | POA: Diagnosis not present

## 2018-01-05 DIAGNOSIS — M109 Gout, unspecified: Secondary | ICD-10-CM | POA: Diagnosis not present

## 2018-01-05 DIAGNOSIS — T148XXA Other injury of unspecified body region, initial encounter: Secondary | ICD-10-CM | POA: Diagnosis not present

## 2018-01-05 DIAGNOSIS — I10 Essential (primary) hypertension: Secondary | ICD-10-CM | POA: Diagnosis not present

## 2018-01-05 LAB — CBC WITH DIFFERENTIAL/PLATELET
BASOS PCT: 1.2 % (ref 0.0–3.0)
Basophils Absolute: 0.1 10*3/uL (ref 0.0–0.1)
EOS ABS: 0.5 10*3/uL (ref 0.0–0.7)
EOS PCT: 8.3 % — AB (ref 0.0–5.0)
HEMATOCRIT: 36.1 % (ref 36.0–46.0)
HEMOGLOBIN: 11.9 g/dL — AB (ref 12.0–15.0)
LYMPHS PCT: 33.7 % (ref 12.0–46.0)
Lymphs Abs: 2.1 10*3/uL (ref 0.7–4.0)
MCHC: 32.8 g/dL (ref 30.0–36.0)
MCV: 90.7 fl (ref 78.0–100.0)
MONOS PCT: 8.7 % (ref 3.0–12.0)
Monocytes Absolute: 0.6 10*3/uL (ref 0.1–1.0)
Neutro Abs: 3 10*3/uL (ref 1.4–7.7)
Neutrophils Relative %: 48.1 % (ref 43.0–77.0)
Platelets: 272 10*3/uL (ref 150.0–400.0)
RBC: 3.98 Mil/uL (ref 3.87–5.11)
RDW: 15.6 % — AB (ref 11.5–15.5)
WBC: 6.3 10*3/uL (ref 4.0–10.5)

## 2018-01-05 LAB — BASIC METABOLIC PANEL
BUN: 30 mg/dL — ABNORMAL HIGH (ref 6–23)
CO2: 26 mEq/L (ref 19–32)
CREATININE: 1.18 mg/dL (ref 0.40–1.20)
Calcium: 9.8 mg/dL (ref 8.4–10.5)
Chloride: 105 mEq/L (ref 96–112)
GFR: 59.64 mL/min — AB (ref >60.00)
GLUCOSE: 96 mg/dL (ref 70–99)
Potassium: 4.4 mEq/L (ref 3.5–5.1)
Sodium: 139 mEq/L (ref 135–145)

## 2018-01-05 LAB — IRON,TIBC AND FERRITIN PANEL
%SAT: 17 % (calc) (ref 16–45)
Ferritin: 86 ng/mL (ref 16–288)
IRON: 51 ug/dL (ref 45–160)
TIBC: 303 mcg/dL (calc) (ref 250–450)

## 2018-01-05 LAB — URIC ACID: Uric Acid, Serum: 7.8 mg/dL — ABNORMAL HIGH (ref 2.4–7.0)

## 2018-01-05 LAB — APTT: aPTT: 45.2 s — ABNORMAL HIGH (ref 23.4–32.7)

## 2018-01-05 LAB — PROTIME-INR
INR: 1 ratio (ref 0.8–1.0)
Prothrombin Time: 11.5 s (ref 9.6–13.1)

## 2018-01-05 LAB — SEDIMENTATION RATE: SED RATE: 48 mm/h — AB (ref 0–30)

## 2018-01-11 DIAGNOSIS — H52223 Regular astigmatism, bilateral: Secondary | ICD-10-CM | POA: Diagnosis not present

## 2018-01-11 DIAGNOSIS — H5213 Myopia, bilateral: Secondary | ICD-10-CM | POA: Diagnosis not present

## 2018-01-11 DIAGNOSIS — H2513 Age-related nuclear cataract, bilateral: Secondary | ICD-10-CM | POA: Diagnosis not present

## 2018-02-10 ENCOUNTER — Encounter: Payer: Self-pay | Admitting: Internal Medicine

## 2018-02-10 ENCOUNTER — Ambulatory Visit (INDEPENDENT_AMBULATORY_CARE_PROVIDER_SITE_OTHER): Payer: Medicare Other | Admitting: Internal Medicine

## 2018-02-10 VITALS — BP 146/88 | HR 83 | Temp 98.1°F | Ht 65.0 in | Wt 219.0 lb

## 2018-02-10 DIAGNOSIS — Z933 Colostomy status: Secondary | ICD-10-CM | POA: Diagnosis not present

## 2018-02-10 DIAGNOSIS — Z23 Encounter for immunization: Secondary | ICD-10-CM

## 2018-02-10 DIAGNOSIS — K9413 Enterostomy malfunction: Secondary | ICD-10-CM | POA: Diagnosis not present

## 2018-02-10 DIAGNOSIS — M109 Gout, unspecified: Secondary | ICD-10-CM | POA: Diagnosis not present

## 2018-02-10 DIAGNOSIS — K9403 Colostomy malfunction: Secondary | ICD-10-CM | POA: Diagnosis not present

## 2018-02-10 MED ORDER — PHENTERMINE HCL 37.5 MG PO TABS: 37.5000 mg | ORAL_TABLET | Freq: Every day | ORAL | 2 refills | Status: DC

## 2018-02-10 NOTE — Progress Notes (Signed)
Subjective:  Patient ID: Emily Sanchez, female    DOB: [birth year]  Age: 62 y.o. MRN: [id number]  CC: No chief complaint on file.   HPI Emily Sanchez presents for gout - better, chronic pain C/o stoma problems C/o wt gain  Outpatient Medications Prior to Visit  Medication Sig Dispense Refill  . allopurinol (ZYLOPRIM) 100 MG tablet Take 1 tablet (100 mg total) by mouth daily. 90 tablet 3  . cholecalciferol (VITAMIN D) 1000 units tablet Take 1,000 Units by mouth daily.    . colchicine 0.6 MG tablet Take 1 tablet (0.6 mg total) by mouth daily. 90 tablet 3  . cyanocobalamin (,VITAMIN B-12,) 1000 MCG/ML injection Inject 1 mL (1,000 mcg total) into the muscle every 14 (fourteen) days. 20 mL 3  . diclofenac sodium (VOLTAREN) 1 % GEL Apply 1 application topically 4 (four) times daily. 100 g 3  . hydrochlorothiazide (MICROZIDE) 12.5 MG capsule Take 1 capsule (12.5 mg total) by mouth daily. 90 capsule 0  . Multiple Vitamin (MULTIVITAMIN WITH MINERALS) TABS tablet Take 1 tablet by mouth daily.    . pantoprazole (PROTONIX) 40 MG tablet TAKE 1 TABLET BY MOUTH EVERY DAY 90 tablet 3  . SYRINGE-NEEDLE, DISP, 3 ML (BD INTEGRA SYRINGE) 25G X 1" 3 ML MISC TO BE USED FOR B12 INJECTIONS 50 each 3  . traMADol (ULTRAM) 50 MG tablet Take 1 tablet (50 mg total) by mouth every 6 (six) hours as needed for severe pain. 120 tablet 0   No facility-administered medications prior to visit.     ROS: Review of Systems  Constitutional: Positive for fatigue. Negative for activity change, appetite change, chills and unexpected weight change.  HENT: Negative for congestion, mouth sores and sinus pressure.   Eyes: Negative for visual disturbance.  Respiratory: Negative for cough and chest tightness.   Gastrointestinal: Negative for abdominal pain and nausea.  Genitourinary: Negative for difficulty urinating, frequency and vaginal pain.  Musculoskeletal: Positive for arthralgias, back pain and gait problem.  Skin:  Negative for pallor and rash.  Neurological: Negative for dizziness, tremors, weakness, numbness and headaches.  Psychiatric/Behavioral: Positive for dysphoric mood. Negative for confusion, sleep disturbance and suicidal ideas. The patient is nervous/anxious.     Objective:  BP (!) 146/88 (BP Location: Left Arm, Patient Position: Sitting, Cuff Size: Large)   Pulse 83   Temp 98.1 F (36.7 C) (Oral)   Ht 5' 5"  (1.651 m)   Wt 219 lb (99.3 kg)   SpO2 99%   BMI 36.44 kg/m   BP Readings from Last 3 Encounters:  02/10/18 (!) 146/88  12/28/17 128/84  09/08/17 126/82    Wt Readings from Last 3 Encounters:  02/10/18 219 lb (99.3 kg)  12/28/17 214 lb (97.1 kg)  09/08/17 206 lb (93.4 kg)    Physical Exam  Constitutional: She appears well-developed. No distress.  HENT:  Head: Normocephalic.  Right Ear: External ear normal.  Left Ear: External ear normal.  Nose: Nose normal.  Mouth/Throat: Oropharynx is clear and moist.  Eyes: Pupils are equal, round, and reactive to light. Conjunctivae are normal. Right eye exhibits no discharge. Left eye exhibits no discharge.  Neck: Normal range of motion. Neck supple. No JVD present. No tracheal deviation present. No thyromegaly present.  Cardiovascular: Normal rate, regular rhythm and normal heart sounds.  Pulmonary/Chest: No stridor. No respiratory distress. She has no wheezes.  Abdominal: Soft. Bowel sounds are normal. She exhibits no distension and no mass. There is no tenderness. There is no rebound  and no guarding.  Musculoskeletal: She exhibits tenderness. She exhibits no edema.  Lymphadenopathy:    She has no cervical adenopathy.  Neurological: She displays normal reflexes. No cranial nerve deficit. She exhibits normal muscle tone. Coordination normal.  Skin: No rash noted. No erythema.  Psychiatric: She has a normal mood and affect. Her behavior is normal. Judgment and thought content normal.  colostomy obese  Lab Results  Component  Value Date   WBC 6.3 01/05/2018   HGB 11.9 (L) 01/05/2018   HCT 36.1 01/05/2018   PLT 272.0 01/05/2018   GLUCOSE 96 01/05/2018   CHOL 172 05/27/2010   TRIG 100.0 05/27/2010   HDL 46.30 05/27/2010   LDLCALC 106 (H) 05/27/2010   ALT 17 06/22/2017   AST 18 06/22/2017   NA 139 01/05/2018   K 4.4 01/05/2018   CL 105 01/05/2018   CREATININE 1.18 01/05/2018   BUN 30 (H) 01/05/2018   CO2 26 01/05/2018   TSH 0.54 02/21/2016   INR 1.0 01/05/2018   HGBA1C 5.9 12/17/2009   MICROALBUR 0.6 02/10/2013    Ct Abdomen Pelvis W Contrast  Result Date: 06/22/2017 CLINICAL DATA:  62 y/o F; recent bowel surgery 06/11/2017 with removal of scar tissue and mass around ileostomy. History of Crohn's colitis. Nausea, low-grade fever, and leukocytosis. EXAM: CT ABDOMEN AND PELVIS WITH CONTRAST TECHNIQUE: Multidetector CT imaging of the abdomen and pelvis was performed using the standard protocol following bolus administration of intravenous contrast. CONTRAST:  1102m ISOVUE-300 IOPAMIDOL (ISOVUE-300) INJECTION 61% COMPARISON:  06/08/2017 CT abdomen and pelvis. FINDINGS: Lower chest: 3 mm left lower lobe perifissural nodule, likely intrapulmonary lymph node, is stable. Hepatobiliary: Stable subcentimeter lucencies in the liver, likely cysts. Decompressed gallbladder. No biliary dilatation. Pancreas: Unremarkable. No pancreatic ductal dilatation or surrounding inflammatory changes. Spleen: Normal in size without focal abnormality. Adrenals/Urinary Tract: Adrenal glands are unremarkable. Multiple cortical scars in the kidneys bilaterally. Kidneys are otherwise normal, without renal calculi, focal lesion, or hydronephrosis. Bladder is unremarkable. Stomach/Bowel: Partial gastrectomy. Mild wall thickening of small bowel in the mid abdomen with interval decompression. Colectomy. Vascular/Lymphatic: Aortic atherosclerosis. No enlarged abdominal or pelvic lymph nodes. Reproductive: Stable uterine masses, likely myoma measuring  up to 5.3 cm. Other: Midline anterior abdominal wall fluid collection centered in subcutaneous fat with small intraperitoneal component measuring 8.8 x 6.5 x 15.9 cm (AP x ML x CC series 2, image 58 and series 7, image 72). Collection in the right lower quadrant ileostomy measuring 1.8 x 10.5 x 7.8 cm (series 6, image 26 and series 7, image 37) centered in subcutaneous fat. Fluid collections demonstrate mild peripheral enhancement. Mild edema in the mesenteric. Musculoskeletal: No fracture is seen. Grade 1 L4-5 anterolisthesis and lower lumbar facet hypertrophy. IMPRESSION: 1. Large fluid collections centered in subcutaneous fat of the midline anterior abdominal wall and surrounding the ileostomy with mild rim enhancement which may represent infection. 2. Mild wall thickening of small bowel in the mid abdomen. Mild mesenteric edema. Inflammation may be due to recent surgery, history of Crohn's, or infection. Electronically Signed   By: LKristine GarbeM.D.   On: 06/22/2017 14:09    Assessment & Plan:   There are no diagnoses linked to this encounter.   No orders of the defined types were placed in this encounter.    Follow-up: No follow-ups on file.  AWalker Kehr MD

## 2018-02-10 NOTE — Assessment & Plan Note (Addendum)
Better Cont: low dose Allopurinol Daily colchicine Colchicine prn

## 2018-02-10 NOTE — Assessment & Plan Note (Signed)
Surg ref - Dr Marcello Moores

## 2018-02-10 NOTE — Assessment & Plan Note (Signed)
Worse Phentermine   Potential benefits of a long term phentermine  use as well as potential risks  and complications were explained to the patient and were aknowledged.

## 2018-04-04 ENCOUNTER — Encounter: Payer: Self-pay | Admitting: Internal Medicine

## 2018-04-04 ENCOUNTER — Ambulatory Visit (INDEPENDENT_AMBULATORY_CARE_PROVIDER_SITE_OTHER): Payer: Medicare Other | Admitting: Internal Medicine

## 2018-04-04 DIAGNOSIS — I10 Essential (primary) hypertension: Secondary | ICD-10-CM

## 2018-04-04 MED ORDER — CYCLOBENZAPRINE HCL 5 MG PO TABS: 5.0000 mg | ORAL_TABLET | Freq: Every day | ORAL | 3 refills | Status: DC

## 2018-04-04 MED ORDER — TRAMADOL HCL 50 MG PO TABS: 50.0000 mg | ORAL_TABLET | Freq: Four times a day (QID) | ORAL | 1 refills | Status: DC | PRN

## 2018-04-04 NOTE — Progress Notes (Signed)
Subjective:  Patient ID: Emily Sanchez, female    DOB: 1956-02-25  Age: 62 y.o. MRN: [id number]  CC: No chief complaint on file.   HPI Emily Sanchez presents for LBP, gout, Emily Sanchez def f/u  Outpatient Medications Prior to Visit  Medication Sig Dispense Refill  . allopurinol (ZYLOPRIM) 100 MG tablet Take 1 tablet (100 mg total) by mouth daily. 90 tablet 3  . cholecalciferol (VITAMIN D) 1000 units tablet Take 1,000 Units by mouth daily.    . colchicine 0.6 MG tablet Take 1 tablet (0.6 mg total) by mouth daily. 90 tablet 3  . cyanocobalamin (,VITAMIN B-12,) 1000 MCG/ML injection Inject 1 mL (1,000 mcg total) into the muscle every 14 (fourteen) days. 20 mL 3  . diclofenac sodium (VOLTAREN) 1 % GEL Apply 1 application topically 4 (four) times daily. 100 g 3  . hydrochlorothiazide (MICROZIDE) 12.5 MG capsule Take 1 capsule (12.5 mg total) by mouth daily. 90 capsule 0  . Multiple Vitamin (MULTIVITAMIN WITH MINERALS) TABS tablet Take 1 tablet by mouth daily.    . pantoprazole (PROTONIX) 40 MG tablet TAKE 1 TABLET BY MOUTH EVERY DAY 90 tablet 3  . SYRINGE-NEEDLE, DISP, 3 ML (BD INTEGRA SYRINGE) 25G X 1" 3 ML MISC TO BE USED FOR Emily Sanchez INJECTIONS 50 each 3  . traMADol (ULTRAM) 50 MG tablet Take 1 tablet (50 mg total) by mouth every 6 (six) hours as needed for severe pain. 120 tablet 0  . phentermine (ADIPEX-P) 37.5 MG tablet Take 1 tablet (37.5 mg total) by mouth daily before breakfast. 30 tablet 2   No facility-administered medications prior to visit.     ROS: Review of Systems  Constitutional: Negative for activity change, appetite change, chills, fatigue and unexpected weight change.  HENT: Negative for congestion, mouth sores and sinus pressure.   Eyes: Negative for visual disturbance.  Respiratory: Negative for cough and chest tightness.   Gastrointestinal: Negative for abdominal pain and nausea.  Genitourinary: Negative for difficulty urinating, frequency and vaginal pain.    Musculoskeletal: Positive for arthralgias, back pain and gait problem.  Skin: Negative for pallor and rash.  Neurological: Negative for dizziness, tremors, weakness, numbness and headaches.  Psychiatric/Behavioral: Negative for confusion, sleep disturbance and suicidal ideas. The patient is nervous/anxious.     Objective:  BP 132/88 (BP Location: Left Arm, Patient Position: Sitting, Cuff Size: Normal)   Pulse 91   Temp 97.9 F (36.6 C) (Oral)   Ht 5' 5"  (1.651 m)   Wt 208 lb (94.3 kg)   SpO2 99%   BMI 34.61 kg/m   BP Readings from Last 3 Encounters:  04/04/18 132/88  02/10/18 (!) 146/88  12/28/17 128/84    Wt Readings from Last 3 Encounters:  04/04/18 208 lb (94.3 kg)  02/10/18 219 lb (99.3 kg)  12/28/17 214 lb (97.1 kg)    Physical Exam Constitutional:      General: She is not in acute distress.    Appearance: She is well-developed.  HENT:     Head: Normocephalic.     Right Ear: External ear normal.     Left Ear: External ear normal.     Nose: Nose normal.  Eyes:     General:        Right eye: No discharge.        Left eye: No discharge.     Conjunctiva/sclera: Conjunctivae normal.     Pupils: Pupils are equal, round, and reactive to light.  Neck:     Musculoskeletal: Normal  range of motion and neck supple.     Thyroid: No thyromegaly.     Vascular: No JVD.     Trachea: No tracheal deviation.  Cardiovascular:     Rate and Rhythm: Normal rate and regular rhythm.     Heart sounds: Normal heart sounds.  Pulmonary:     Effort: No respiratory distress.     Breath sounds: No stridor. No wheezing.  Abdominal:     General: Bowel sounds are normal. There is no distension.     Palpations: Abdomen is soft. There is no mass.     Tenderness: There is abdominal tenderness. There is no guarding or rebound.  Musculoskeletal:        General: No tenderness.  Lymphadenopathy:     Cervical: No cervical adenopathy.  Skin:    Findings: No erythema or rash.  Neurological:      Cranial Nerves: No cranial nerve deficit.     Motor: No abnormal muscle tone.     Coordination: Coordination normal.     Deep Tendon Reflexes: Reflexes normal.  Psychiatric:        Behavior: Behavior normal.        Thought Content: Thought content normal.        Judgment: Judgment normal.   LS tender Emily Sanchez   Lab Results  Component Value Date   WBC 6.3 01/05/2018   HGB 11.9 (L) 01/05/2018   HCT 36.1 01/05/2018   PLT 272.0 01/05/2018   GLUCOSE 96 01/05/2018   CHOL 172 05/27/2010   TRIG 100.0 05/27/2010   HDL 46.30 05/27/2010   LDLCALC 106 (H) 05/27/2010   ALT 17 06/22/2017   AST 18 06/22/2017   NA 139 01/05/2018   K 4.4 01/05/2018   CL 105 01/05/2018   CREATININE 1.18 01/05/2018   BUN 30 (H) 01/05/2018   CO2 26 01/05/2018   TSH 0.54 02/21/2016   INR 1.0 01/05/2018   HGBA1C 5.9 12/17/2009   MICROALBUR 0.6 02/10/2013    Ct Abdomen Pelvis W Contrast  Result Date: 06/22/2017 CLINICAL DATA:  62 y/o F; recent bowel surgery 06/11/2017 with removal of scar tissue and mass around ileostomy. History of Crohn's colitis. Nausea, low-grade fever, and leukocytosis. EXAM: CT ABDOMEN AND PELVIS WITH CONTRAST TECHNIQUE: Multidetector CT imaging of the abdomen and pelvis was performed using the standard protocol following bolus administration of intravenous contrast. CONTRAST:  137m ISOVUE-300 IOPAMIDOL (ISOVUE-300) INJECTION 61% COMPARISON:  06/08/2017 CT abdomen and pelvis. FINDINGS: Lower chest: 3 mm left lower lobe perifissural nodule, likely intrapulmonary lymph node, is stable. Hepatobiliary: Stable subcentimeter lucencies in the liver, likely cysts. Decompressed gallbladder. No biliary dilatation. Pancreas: Unremarkable. No pancreatic ductal dilatation or surrounding inflammatory changes. Spleen: Normal in size without focal abnormality. Adrenals/Urinary Tract: Adrenal glands are unremarkable. Multiple cortical scars in the kidneys bilaterally. Kidneys are otherwise normal,  without renal calculi, focal lesion, or hydronephrosis. Bladder is unremarkable. Stomach/Bowel: Partial gastrectomy. Mild wall thickening of small bowel in the mid abdomen with interval decompression. Colectomy. Vascular/Lymphatic: Aortic atherosclerosis. No enlarged abdominal or pelvic lymph nodes. 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 Other: Midline anterior abdominal wall fluid collection centered in subcutaneous fat with small intraperitoneal component measuring 8.8 x 6.5 x 15.9 cm (AP x ML x CC series 2, image 58 and series 7, image 72). Collection in the right lower quadrant ileostomy measuring 1.8 x 10.5 x 7.8 cm (series 6, image 26 and series 7, image 37) centered in subcutaneous fat. Fluid collections demonstrate  mild peripheral enhancement. Mild edema in the mesenteric. Musculoskeletal: No fracture is seen. Grade 1 L4-5 anterolisthesis and lower lumbar facet hypertrophy. IMPRESSION: 1. Large fluid collections centered in subcutaneous fat of the midline anterior abdominal wall and surrounding the ileostomy with mild rim enhancement which may represent infection. 2. Mild wall thickening of small bowel in the mid abdomen. Mild mesenteric edema. Inflammation may be due to recent surgery, history of Crohn's, or infection. Electronically Signed   By: Kristine Garbe M.D.   On: 06/22/2017 14:09    Assessment & Plan:   There are no diagnoses linked to this encounter.   No orders of the defined types were placed in this encounter.    Follow-up: No follow-ups on file.  Walker Kehr, MD

## 2018-04-04 NOTE — Assessment & Plan Note (Addendum)
HCTZ cardiac CT scan for calcium scoring info given

## 2018-04-04 NOTE — Patient Instructions (Addendum)
Cardiac CT calcium scoring test $150   Computed tomography, more commonly known as a CT or CAT scan, is a diagnostic medical imaging test. Like traditional x-rays, it produces multiple images or pictures of the inside of the body. The cross-sectional images generated during a CT scan can be reformatted in multiple planes. They can even generate three-dimensional images. These images can be viewed on a computer monitor, printed on film or by a 3D printer, or transferred to a CD or DVD. CT images of internal organs, bones, soft tissue and blood vessels provide greater detail than traditional x-rays, particularly of soft tissues and blood vessels. A cardiac CT scan for coronary calcium is a non-invasive way of obtaining information about the presence, location and extent of calcified plaque in the coronary arteries-the vessels that supply oxygen-containing blood to the heart muscle. Calcified plaque results when there is a build-up of fat and other substances under the inner layer of the artery. This material can calcify which signals the presence of atherosclerosis, a disease of the vessel wall, also called coronary artery disease (CAD). People with this disease have an increased risk for heart attacks. In addition, over time, progression of plaque build up (CAD) can narrow the arteries or even close off blood flow to the heart. The result may be chest pain, sometimes called "angina," or a heart attack. Because calcium is a marker of CAD, the amount of calcium detected on a cardiac CT scan is a helpful prognostic tool. The findings on cardiac CT are expressed as a calcium score. Another name for this test is coronary artery calcium scoring.  What are some common uses of the procedure? The goal of cardiac CT scan for calcium scoring is to determine if CAD is present and to what extent, even if there are no symptoms. It is a screening study that may be recommended by a physician for patients with risk factors  for CAD but no clinical symptoms. The major risk factors for CAD are: . high blood cholesterol levels  . family history of heart attacks  . diabetes  . high blood pressure  . cigarette smoking  . overweight or obese  . physical inactivity   A negative cardiac CT scan for calcium scoring shows no calcification within the coronary arteries. This suggests that CAD is absent or so minimal it cannot be seen by this technique. The chance of having a heart attack over the next two to five years is very low under these circumstances. A positive test means that CAD is present, regardless of whether or not the patient is experiencing any symptoms. The amount of calcification-expressed as the calcium score-may help to predict the likelihood of a myocardial infarction (heart attack) in the coming years and helps your medical doctor or cardiologist decide whether the patient may need to take preventive medicine or undertake other measures such as diet and exercise to lower the risk for heart attack. The extent of CAD is graded according to your calcium score:  Calcium Score  Presence of CAD  0 No evidence of CAD   1-10 Minimal evidence of CAD  11-100 Mild evidence of CAD  101-400 Moderate evidence of CAD  Over 400 Extensive evidence of CAD     Wt Readings from Last 3 Encounters:  04/04/18 208 lb (94.3 kg)  02/10/18 219 lb (99.3 kg)  12/28/17 214 lb (97.1 kg)

## 2018-05-12 ENCOUNTER — Other Ambulatory Visit: Payer: Self-pay | Admitting: Internal Medicine

## 2018-05-12 DIAGNOSIS — Z1231 Encounter for screening mammogram for malignant neoplasm of breast: Secondary | ICD-10-CM

## 2018-05-26 ENCOUNTER — Other Ambulatory Visit: Payer: Self-pay | Admitting: General Surgery

## 2018-05-26 DIAGNOSIS — K9413 Enterostomy malfunction: Secondary | ICD-10-CM

## 2018-06-01 ENCOUNTER — Ambulatory Visit
Admission: RE | Admit: 2018-06-01 | Discharge: 2018-06-01 | Disposition: A | Discharge status: Home / Self Care | Payer: Medicare Other | Source: Ambulatory Visit | Attending: General Surgery | Admitting: General Surgery

## 2018-06-01 DIAGNOSIS — K9413 Enterostomy malfunction: Secondary | ICD-10-CM

## 2018-06-01 MED ORDER — IOPAMIDOL (ISOVUE-300) INJECTION 61%
100.0000 mL | Freq: Once | INTRAVENOUS | Status: AC | PRN
  Administered 2018-06-01: 100 mL via INTRAVENOUS

## 2018-06-08 ENCOUNTER — Ambulatory Visit
Admission: RE | Admit: 2018-06-08 | Discharge: 2018-06-08 | Disposition: A | Discharge status: Home / Self Care | Payer: Medicare Other | Source: Ambulatory Visit | Attending: Internal Medicine | Admitting: Internal Medicine

## 2018-06-08 DIAGNOSIS — Z1231 Encounter for screening mammogram for malignant neoplasm of breast: Secondary | ICD-10-CM

## 2018-07-06 ENCOUNTER — Telehealth: Payer: Self-pay | Admitting: Internal Medicine

## 2018-07-06 NOTE — Telephone Encounter (Signed)
Patient dropped off long term disability forms to be completed. Patient is due for a 3 month FU. She was informed that we will need to send office notes with her forms, and needs to keep up with her follow ups. She is going to call the office back to make an appointment.   I will hold the forms until then.

## 2018-07-08 NOTE — Telephone Encounter (Signed)
Appointment made for 3/23 Monday.

## 2018-07-11 ENCOUNTER — Other Ambulatory Visit: Payer: Self-pay

## 2018-07-11 ENCOUNTER — Encounter: Payer: Self-pay | Admitting: Internal Medicine

## 2018-07-11 ENCOUNTER — Ambulatory Visit: Payer: Medicare Other | Admitting: Internal Medicine

## 2018-07-11 DIAGNOSIS — R110 Nausea: Secondary | ICD-10-CM

## 2018-07-11 DIAGNOSIS — G8929 Other chronic pain: Secondary | ICD-10-CM

## 2018-07-11 DIAGNOSIS — K50118 Crohn's disease of large intestine with other complication: Secondary | ICD-10-CM | POA: Diagnosis not present

## 2018-07-11 DIAGNOSIS — M5440 Lumbago with sciatica, unspecified side: Secondary | ICD-10-CM

## 2018-07-11 DIAGNOSIS — E538 Deficiency of other specified B group vitamins: Secondary | ICD-10-CM | POA: Diagnosis not present

## 2018-07-11 DIAGNOSIS — I10 Essential (primary) hypertension: Secondary | ICD-10-CM

## 2018-07-11 MED ORDER — CYANOCOBALAMIN 1000 MCG/ML IJ SOLN: 1000.0000 ug | INTRAMUSCULAR | 3 refills | Status: DC

## 2018-07-11 MED ORDER — TRAMADOL HCL 50 MG PO TABS: 50.0000 mg | ORAL_TABLET | Freq: Four times a day (QID) | ORAL | 2 refills | Status: DC | PRN

## 2018-07-11 MED ORDER — PROMETHAZINE HCL 12.5 MG PO TABS: 12.5000 mg | ORAL_TABLET | Freq: Four times a day (QID) | ORAL | 0 refills | Status: DC | PRN

## 2018-07-11 NOTE — Progress Notes (Signed)
Subjective:  Patient ID: Emily Sanchez, female    DOB: 1955/10/28  Age: 63 y.o. MRN: [id number]  CC: No chief complaint on file.   HPI Emily Sanchez presents for chronic pain, colitis, obesity f/u C/o nausea  Outpatient Medications Prior to Visit  Medication Sig Dispense Refill  . allopurinol (ZYLOPRIM) 100 MG tablet Take 1 tablet (100 mg total) by mouth daily. 90 tablet 3  . cholecalciferol (VITAMIN D) 1000 units tablet Take 1,000 Units by mouth daily.    . colchicine 0.6 MG tablet Take 1 tablet (0.6 mg total) by mouth daily. 90 tablet 3  . cyanocobalamin (,VITAMIN B-12,) 1000 MCG/ML injection Inject 1 mL (1,000 mcg total) into the muscle every 14 (fourteen) days. 20 mL 3  . cyclobenzaprine (FLEXERIL) 5 MG tablet Take 1 tablet (5 mg total) by mouth at bedtime. 30 tablet 3  . diclofenac sodium (VOLTAREN) 1 % GEL Apply 1 application topically 4 (four) times daily. 100 g 3  . hydrochlorothiazide (MICROZIDE) 12.5 MG capsule Take 1 capsule (12.5 mg total) by mouth daily. 90 capsule 0  . Multiple Vitamin (MULTIVITAMIN WITH MINERALS) TABS tablet Take 1 tablet by mouth daily.    . pantoprazole (PROTONIX) 40 MG tablet TAKE 1 TABLET BY MOUTH EVERY DAY 90 tablet 3  . SYRINGE-NEEDLE, DISP, 3 ML (BD INTEGRA SYRINGE) 25G X 1" 3 ML MISC TO BE USED FOR B12 INJECTIONS 50 each 3  . traMADol (ULTRAM) 50 MG tablet Take 1 tablet (50 mg total) by mouth every 6 (six) hours as needed for severe pain. 120 tablet 1  . phentermine (ADIPEX-P) 37.5 MG tablet Take 1 tablet (37.5 mg total) by mouth daily before breakfast. 30 tablet 2   No facility-administered medications prior to visit.     ROS: Review of Systems  Objective:  BP 134/84 (BP Location: Left Arm, Patient Position: Sitting, Cuff Size: Large)   Pulse 89   Temp 97.6 F (36.4 C) (Oral)   Ht 5' 5"  (1.651 m)   Wt 210 lb (95.3 kg)   SpO2 99%   BMI 34.95 kg/m   BP Readings from Last 3 Encounters:  07/11/18 134/84  04/04/18 132/88  02/10/18  (!) 146/88    Wt Readings from Last 3 Encounters:  07/11/18 210 lb (95.3 kg)  04/04/18 208 lb (94.3 kg)  02/10/18 219 lb (99.3 kg)    Physical Exam  Lab Results  Component Value Date   WBC 6.3 01/05/2018   HGB 11.9 (L) 01/05/2018   HCT 36.1 01/05/2018   PLT 272.0 01/05/2018   GLUCOSE 96 01/05/2018   CHOL 172 05/27/2010   TRIG 100.0 05/27/2010   HDL 46.30 05/27/2010   LDLCALC 106 (H) 05/27/2010   ALT 17 06/22/2017   AST 18 06/22/2017   NA 139 01/05/2018   K 4.4 01/05/2018   CL 105 01/05/2018   CREATININE 1.18 01/05/2018   BUN 30 (H) 01/05/2018   CO2 26 01/05/2018   TSH 0.54 02/21/2016   INR 1.0 01/05/2018   HGBA1C 5.9 12/17/2009   MICROALBUR 0.6 02/10/2013    Mm 3d Screen Breast Bilateral  Result Date: 06/09/2018 CLINICAL DATA:  Screening. EXAM: DIGITAL SCREENING BILATERAL MAMMOGRAM WITH TOMO AND CAD COMPARISON:  Previous exam(s). ACR Breast Density Category b: There are scattered areas of fibroglandular density. FINDINGS: There are no findings suspicious for malignancy. Images were processed with CAD. IMPRESSION: No mammographic evidence of malignancy. A result letter of this screening mammogram will be mailed directly to the patient. RECOMMENDATION: Screening  mammogram in one year. (Code:SM-B-01Y) BI-RADS CATEGORY  1: Negative. Electronically Signed   By: Abelardo Diesel M.D.   On: 06/09/2018 15:00    Assessment & Plan:   There are no diagnoses linked to this encounter.   No orders of the defined types were placed in this encounter.    Follow-up: No follow-ups on file.  Walker Kehr, MD

## 2018-07-11 NOTE — Assessment & Plan Note (Signed)
On B12 

## 2018-07-11 NOTE — Assessment & Plan Note (Signed)
On HCTZ

## 2018-07-11 NOTE — Telephone Encounter (Signed)
Forms have been completed & given to provider to sign.

## 2018-07-11 NOTE — Assessment & Plan Note (Signed)
S/p colectomy/colostomy

## 2018-07-11 NOTE — Assessment & Plan Note (Signed)
Phenergan prn - low dose

## 2018-07-11 NOTE — Assessment & Plan Note (Signed)
On Tramadol 50-100 mg prn  Potential benefits of a long term opioids use as well as potential risks (i.e. addiction risk, apnea etc) and complications (i.e. Somnolence, constipation and others) were explained to the patient and were aknowledged.

## 2018-07-12 DIAGNOSIS — Z0279 Encounter for issue of other medical certificate: Secondary | ICD-10-CM

## 2018-07-12 NOTE — Telephone Encounter (Signed)
Forms have been signed, Copy sent to scan &charged for.   Unable to fax them, due to the patient has not signed the forms. Patient is aware. I am mailing the forms to her along with a copy.

## 2018-09-30 ENCOUNTER — Other Ambulatory Visit: Payer: Self-pay | Admitting: Internal Medicine

## 2018-10-18 ENCOUNTER — Encounter: Payer: Self-pay | Admitting: Internal Medicine

## 2018-10-18 ENCOUNTER — Ambulatory Visit (INDEPENDENT_AMBULATORY_CARE_PROVIDER_SITE_OTHER): Payer: Medicare Other | Admitting: Internal Medicine

## 2018-10-18 DIAGNOSIS — G8929 Other chronic pain: Secondary | ICD-10-CM

## 2018-10-18 DIAGNOSIS — F411 Generalized anxiety disorder: Secondary | ICD-10-CM

## 2018-10-18 DIAGNOSIS — E538 Deficiency of other specified B group vitamins: Secondary | ICD-10-CM

## 2018-10-18 DIAGNOSIS — M5440 Lumbago with sciatica, unspecified side: Secondary | ICD-10-CM | POA: Diagnosis not present

## 2018-10-18 MED ORDER — TRAMADOL HCL 50 MG PO TABS: 50.0000 mg | ORAL_TABLET | Freq: Four times a day (QID) | ORAL | 2 refills | Status: DC | PRN

## 2018-10-18 MED ORDER — PHENTERMINE HCL 37.5 MG PO TABS: 37.5000 mg | ORAL_TABLET | Freq: Every day | ORAL | 2 refills | Status: DC

## 2018-10-18 NOTE — Assessment & Plan Note (Signed)
On B12 

## 2018-10-18 NOTE — Assessment & Plan Note (Signed)
Tramadol prn

## 2018-10-18 NOTE — Assessment & Plan Note (Signed)
Off meds

## 2018-10-18 NOTE — Assessment & Plan Note (Signed)
Phentermine   Potential benefits of a long term phentermine  use as well as potential risks  and complications were explained to the patient and were aknowledged.

## 2018-10-18 NOTE — Progress Notes (Signed)
Virtual Visit via Video Note  I connected with Emily Sanchez on 10/18/18 at 10:20 AM EDT by a video enabled telemedicine application and verified that I am speaking with the correct person using two identifiers.   I discussed the limitations of evaluation and management by telemedicine and the availability of in person appointments. The patient expressed understanding and agreed to proceed.  History of Present Illness: We need to follow-up on obesity, LBP, anxiety f/u  There has been no runny nose, cough, chest pain, shortness of breath, abdominal pain, constipation,  skin rashes.   Observations/Objective: The patient appears to be in no acute distress, looks well.  Assessment and Plan:  See my Assessment and Plan. Follow Up Instructions:    I discussed the assessment and treatment plan with the patient. The patient was provided an opportunity to ask questions and all were answered. The patient agreed with the plan and demonstrated an understanding of the instructions.   The patient was advised to call back or seek an in-person evaluation if the symptoms worsen or if the condition fails to improve as anticipated.  I provided face-to-face time during this encounter. We were at different locations.   Walker Kehr, MD

## 2018-11-04 ENCOUNTER — Other Ambulatory Visit: Payer: Self-pay | Admitting: Internal Medicine

## 2018-12-25 ENCOUNTER — Other Ambulatory Visit: Payer: Self-pay | Admitting: Internal Medicine

## 2018-12-26 ENCOUNTER — Other Ambulatory Visit: Payer: Self-pay | Admitting: Internal Medicine

## 2019-01-12 ENCOUNTER — Ambulatory Visit (INDEPENDENT_AMBULATORY_CARE_PROVIDER_SITE_OTHER): Payer: Medicare Other

## 2019-01-12 DIAGNOSIS — Z23 Encounter for immunization: Secondary | ICD-10-CM | POA: Diagnosis not present

## 2019-02-14 ENCOUNTER — Other Ambulatory Visit: Payer: Self-pay | Admitting: Internal Medicine

## 2019-02-20 ENCOUNTER — Other Ambulatory Visit: Payer: Self-pay | Admitting: Internal Medicine

## 2019-02-20 NOTE — Telephone Encounter (Signed)
Medication Refill - Medication: Tramadol, promethazine   Has the patient contacted their pharmacy? No. Pt states she is completely out of these medications. Please advise.  (Agent: If no, request that the patient contact the pharmacy for the refill.) (Agent: If yes, when and what did the pharmacy advise?)  Preferred Pharmacy (with phone number or street name):  [id number] Phone: [phone]: [phone] Not a 24 hour pharmacy; exact hours not known.     Agent: Please be advised that RX refills may take up to 3 business days. We ask that you follow-up with your pharmacy.

## 2019-02-20 NOTE — Telephone Encounter (Signed)
Requested medication (s) are due for refill today: yes  Requested medication (s) are on the active medication list: yes  Last refill:  10/18/2018  Future visit scheduled: no  Notes to clinic: refill cannot be delegated   Requested Prescriptions  Pending Prescriptions Disp Refills   promethazine (PHENERGAN) 12.5 MG tablet 20 tablet 0    Sig: Take 1 tablet (12.5 mg total) by mouth every 6 (six) hours as needed for up to 7 days for nausea or vomiting.     Not Delegated - Gastroenterology: Antiemetics Failed - 02/20/2019 12:21 PM      Failed - This refill cannot be delegated      Passed - Valid encounter within last 6 months    Recent Outpatient Visits          4 months ago B12 deficiency   [place], Evie Lacks, MD   7 months ago Crohn's disease of large intestine with other complication [facility])   [place], Evie Lacks, MD   10 months ago Essential hypertension   [facility] [facility], Evie Lacks, MD   1 year ago Colostomy present [facility])   [place], Evie Lacks, MD   1 year ago [employer] [facility], Evie Lacks, MD              traMADol (ULTRAM) 50 MG tablet 120 tablet 2    Sig: Take 1 tablet (50 mg total) by mouth every 6 (six) hours as needed for severe pain.     Not Delegated - Analgesics:  Opioid Agonists Failed - 02/20/2019 12:21 PM      Failed - This refill cannot be delegated      Failed - Urine Drug Screen completed in last 360 days.      Passed - Valid encounter within last 6 months    Recent Outpatient Visits          4 months ago B12 deficiency   [place], Evie Lacks, MD   7 months ago Crohn's disease of large intestine with other complication [facility]   [place], Evie Lacks, MD   10 months ago Essential hypertension   [facility], Evie Lacks, MD   1 year ago Colostomy present [facility])   [facility], Evie Lacks, MD   1 year ago [employer] [facility], Evie Lacks, MD

## 2019-02-22 MED ORDER — TRAMADOL HCL 50 MG PO TABS: 50.0000 mg | ORAL_TABLET | Freq: Four times a day (QID) | ORAL | 2 refills | Status: DC | PRN

## 2019-02-22 MED ORDER — PROMETHAZINE HCL 12.5 MG PO TABS: 12.5000 mg | ORAL_TABLET | Freq: Four times a day (QID) | ORAL | 0 refills | Status: DC | PRN

## 2019-03-06 ENCOUNTER — Ambulatory Visit: Payer: Medicare Other | Admitting: Internal Medicine

## 2019-03-09 DIAGNOSIS — H52223 Regular astigmatism, bilateral: Secondary | ICD-10-CM | POA: Diagnosis not present

## 2019-03-09 DIAGNOSIS — H524 Presbyopia: Secondary | ICD-10-CM | POA: Diagnosis not present

## 2019-03-09 DIAGNOSIS — H5213 Myopia, bilateral: Secondary | ICD-10-CM | POA: Diagnosis not present

## 2019-03-09 DIAGNOSIS — H2513 Age-related nuclear cataract, bilateral: Secondary | ICD-10-CM | POA: Diagnosis not present

## 2019-03-27 ENCOUNTER — Other Ambulatory Visit: Payer: Self-pay | Admitting: Internal Medicine

## 2019-05-13 ENCOUNTER — Other Ambulatory Visit: Payer: Self-pay | Admitting: Internal Medicine

## 2019-05-18 ENCOUNTER — Telehealth: Payer: Self-pay | Admitting: Internal Medicine

## 2019-05-18 NOTE — Telephone Encounter (Signed)
Pt fell - LBP Tramadol Rx

## 2019-06-16 ENCOUNTER — Other Ambulatory Visit: Payer: Self-pay

## 2019-06-16 MED ORDER — DICLOFENAC SODIUM 1 % EX GEL: 2.0000 g | Freq: Four times a day (QID) | CUTANEOUS | 0 refills | Status: DC

## 2019-06-22 ENCOUNTER — Ambulatory Visit: Payer: Medicare Other | Attending: Internal Medicine

## 2019-06-30 IMAGING — CT CT ABD-PELV W/ CM
2 of 5 series · 16 of 46 positions shown, 18 images · IV contrast (agent unspecified)
Comparison: CT 10/29/2015

CLINICAL DATA: Abdominal pain and distension. Nausea and vomiting.
No output from ostomy.

EXAM:
CT ABDOMEN AND PELVIS WITH CONTRAST
TECHNIQUE: Multidetector CT imaging of the abdomen and pelvis was performed
using the standard protocol following bolus administration of
intravenous contrast.
CONTRAST:  80 cc [id number] IV

[Series 2: axial st · axial · 0.68mm/px · z∈[+1165,+1515]mm · 13 of 82 slices shown, 15 images]
[im 6/82  soft-tissue]
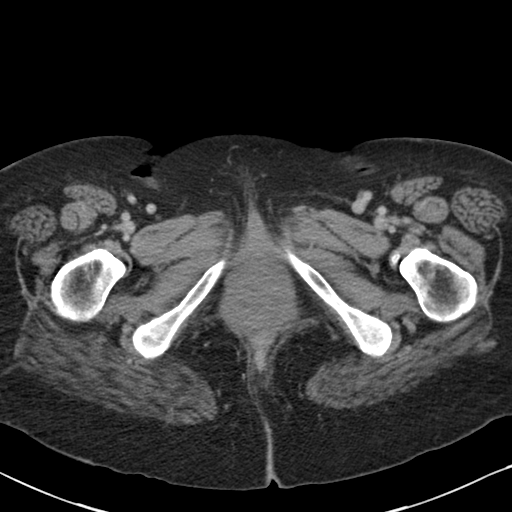
[im 6/82  bone]
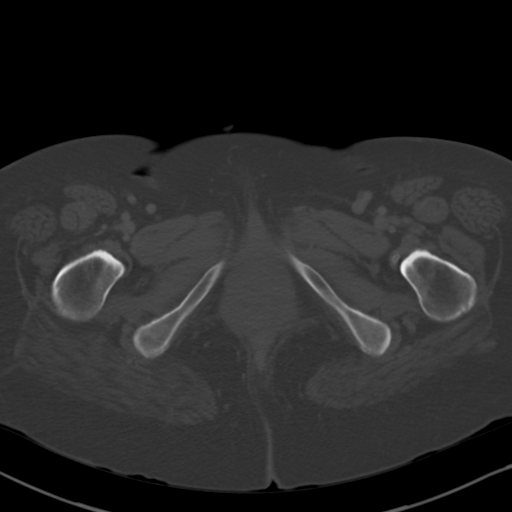
[im 11/82  soft-tissue]
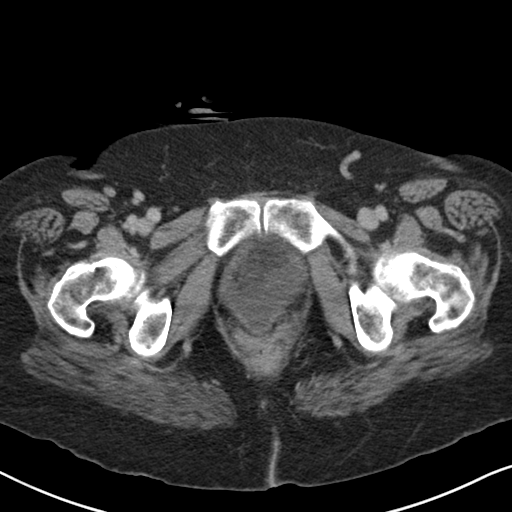
[im 16/82  soft-tissue]
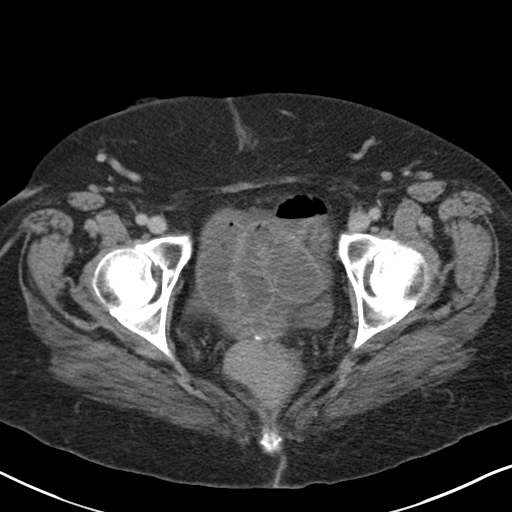
[im 26/82  soft-tissue]
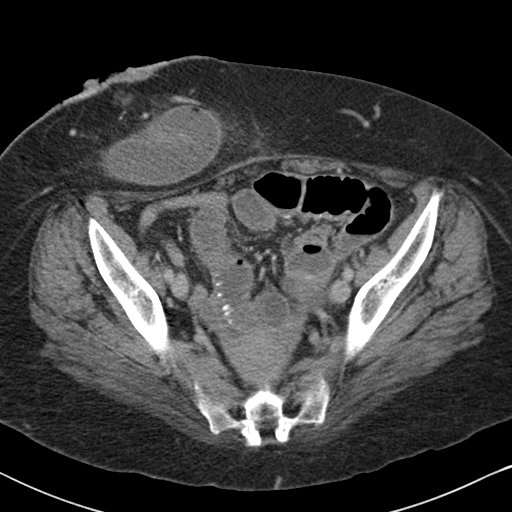
[im 31/82  soft-tissue]
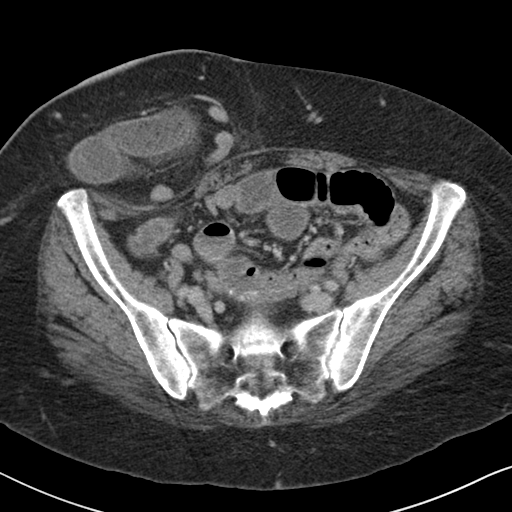
[im 36/82  soft-tissue]
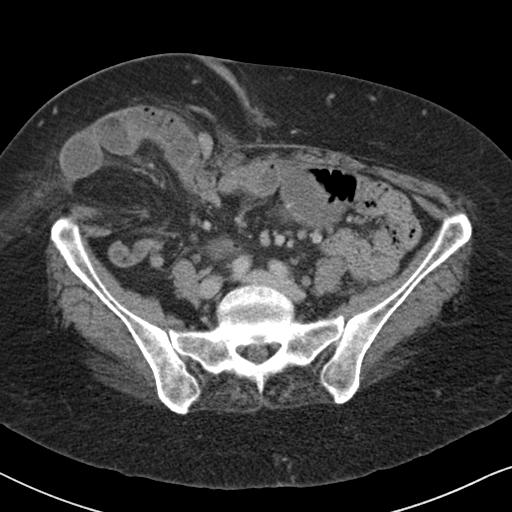
[im 41/82  soft-tissue]
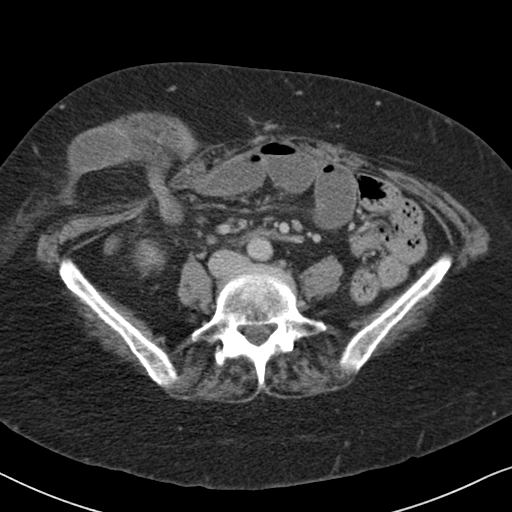
[im 46/82  soft-tissue]
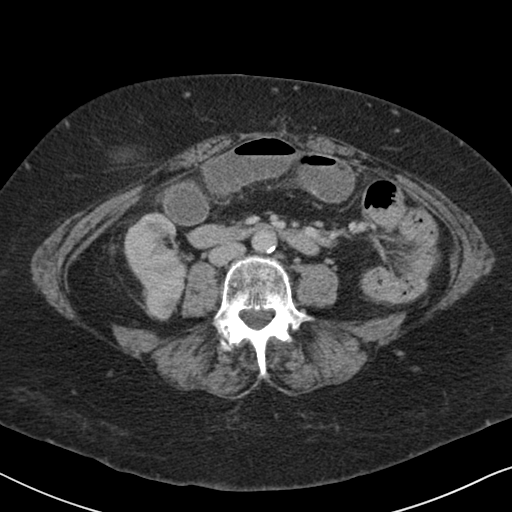
[im 51/82  soft-tissue]
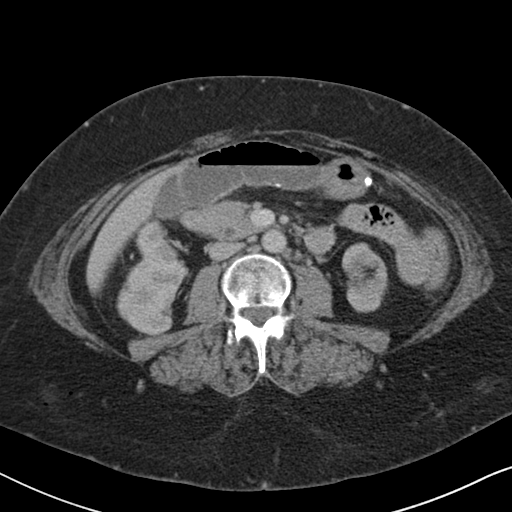
[im 51/82  bone]
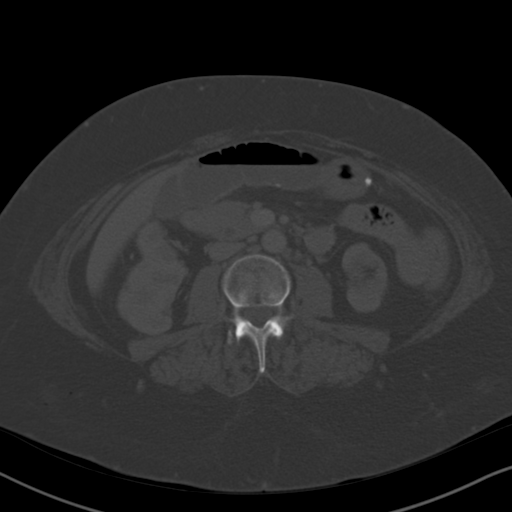
[im 56/82  soft-tissue]
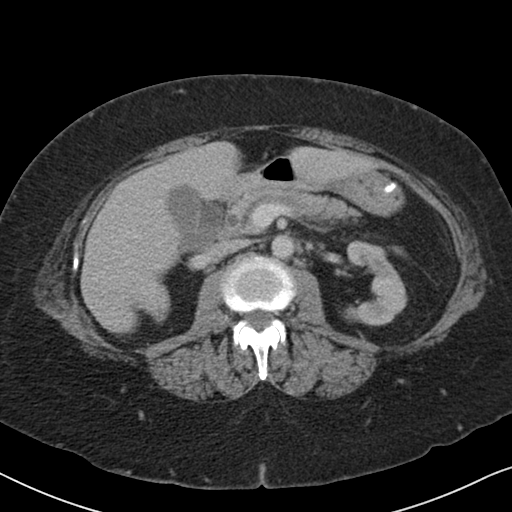
[im 66/82  soft-tissue]
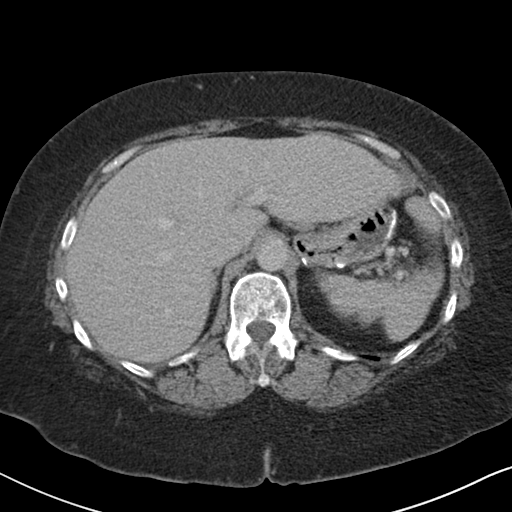
[im 71/82  soft-tissue]
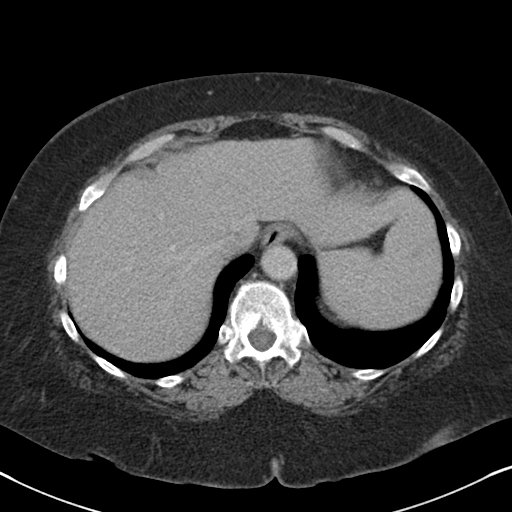
[im 76/82  soft-tissue]
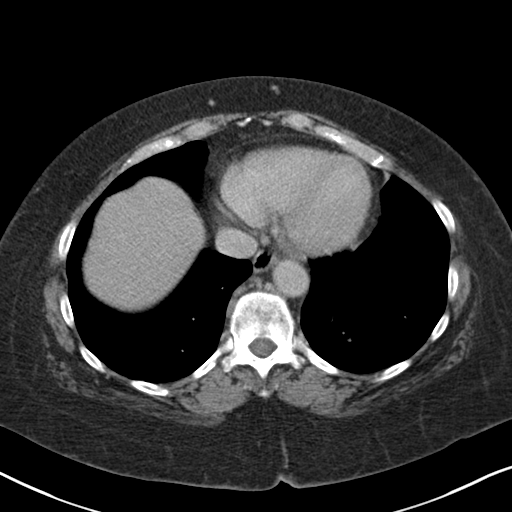

[Series 4: coronal st · coronal · 0.77mm/px · 3 of 92 slices shown]
[im 31/92  soft-tissue]
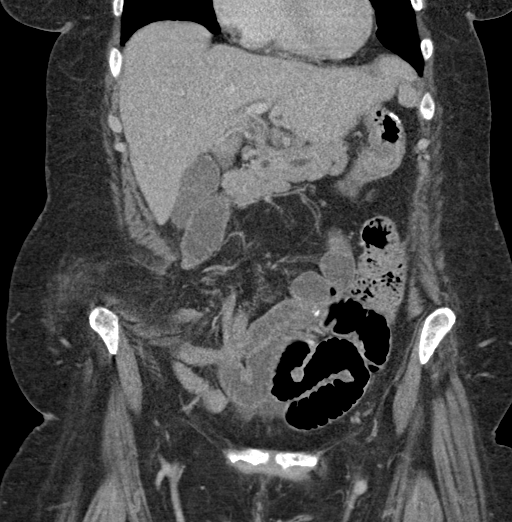
[im 41/92  soft-tissue]
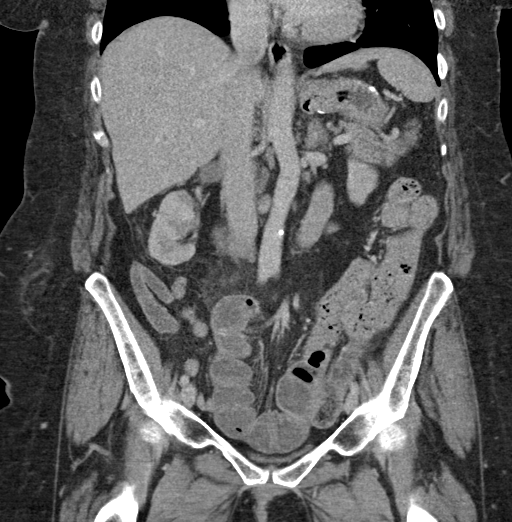
[im 51/92  soft-tissue]
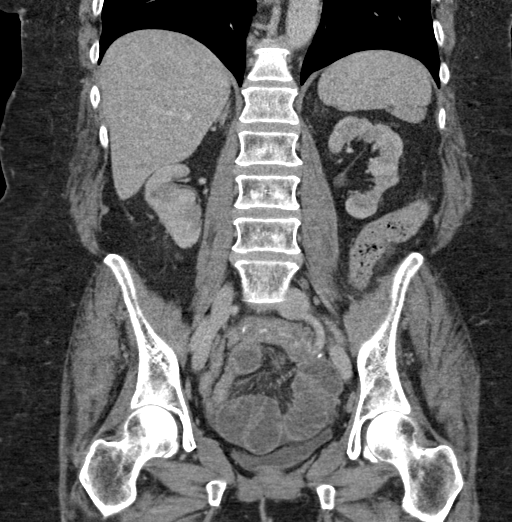

[16 of 46 positions shown; findings below may reference images not displayed]

FINDINGS: Lower chest: Tiny subpleural nodule in the left lower lobe is
unchanged from prior exam and considered benign. No consolidation.
No pleural fluid.

Hepatobiliary: Scattered subcentimeter hepatic hypodensities are too
small to be accurately characterize. Unchanged appearance of
gallbladder without calcified gallstone or biliary dilatation.

Pancreas: Pancreatic ductal prominence measuring 6 mm in the body.
No peripancreatic inflammation. No evidence of focal pancreatic
mass.

Spleen: Normal in size without focal abnormality.

Adrenals/Urinary Tract: No adrenal nodule. Right renal scarring
related to prior trauma. Prior subcapsular hematoma has resolved.
Mild left renal cortical scarring that is chronic. No
hydronephrosis. No perinephric edema. Absent delayed excretion on
delayed imaging. Urinary bladder is near completely decompressed.

Stomach/Bowel: Post gastric sleeve resection. Stomach is
nondistended. Right lower quadrant ileostomy. Increased size of
parastomal hernia. Small bowel obstruction within the parastomal
hernia with dilated fecalized small bowel contents in the hernia
sac. The small bowel in the central abdomen and lower pelvis are
also dilated and fluid-filled. There is associated mesenteric edema
in the pelvis. No pneumatosis or perforation. Patient is post total
colectomy.

Vascular/Lymphatic: Mild aortic atherosclerosis. Small
retroperitoneal nodes, not enlarged by size criteria.

Reproductive: Probable uterine fibroid is unchanged. No adnexal
mass.

Other: Small amount of free fluid. Mesenteric edema related to of
small bowel dilatation. No free air. Small fat containing midline
ventral abdominal wall hernia.

Musculoskeletal: There are no acute or suspicious osseous
abnormalities.
IMPRESSION: 1. Right lower quadrant ileostomy with small bowel obstruction,
transition point at the parastomal hernia.
2. Pancreatic ductal prominence of 6 mm, likely sequela of prior
pancreatic trauma.
3. Bilateral renal cortical scarring. Absent renal excretion on
delayed phase imaging suggest underlying renal dysfunction.

## 2019-07-01 IMAGING — DX DG ABD PORTABLE 1V
2 series · 2 of 2 positions shown · non-contrast
Comparison: Radiographs and CT yesterday.

CLINICAL DATA: Small bowel obstruction protocol. 8 hour delayed
film.

EXAM:
PORTABLE ABDOMEN - 1 VIEW

[abdomen kub (1 of 2)]
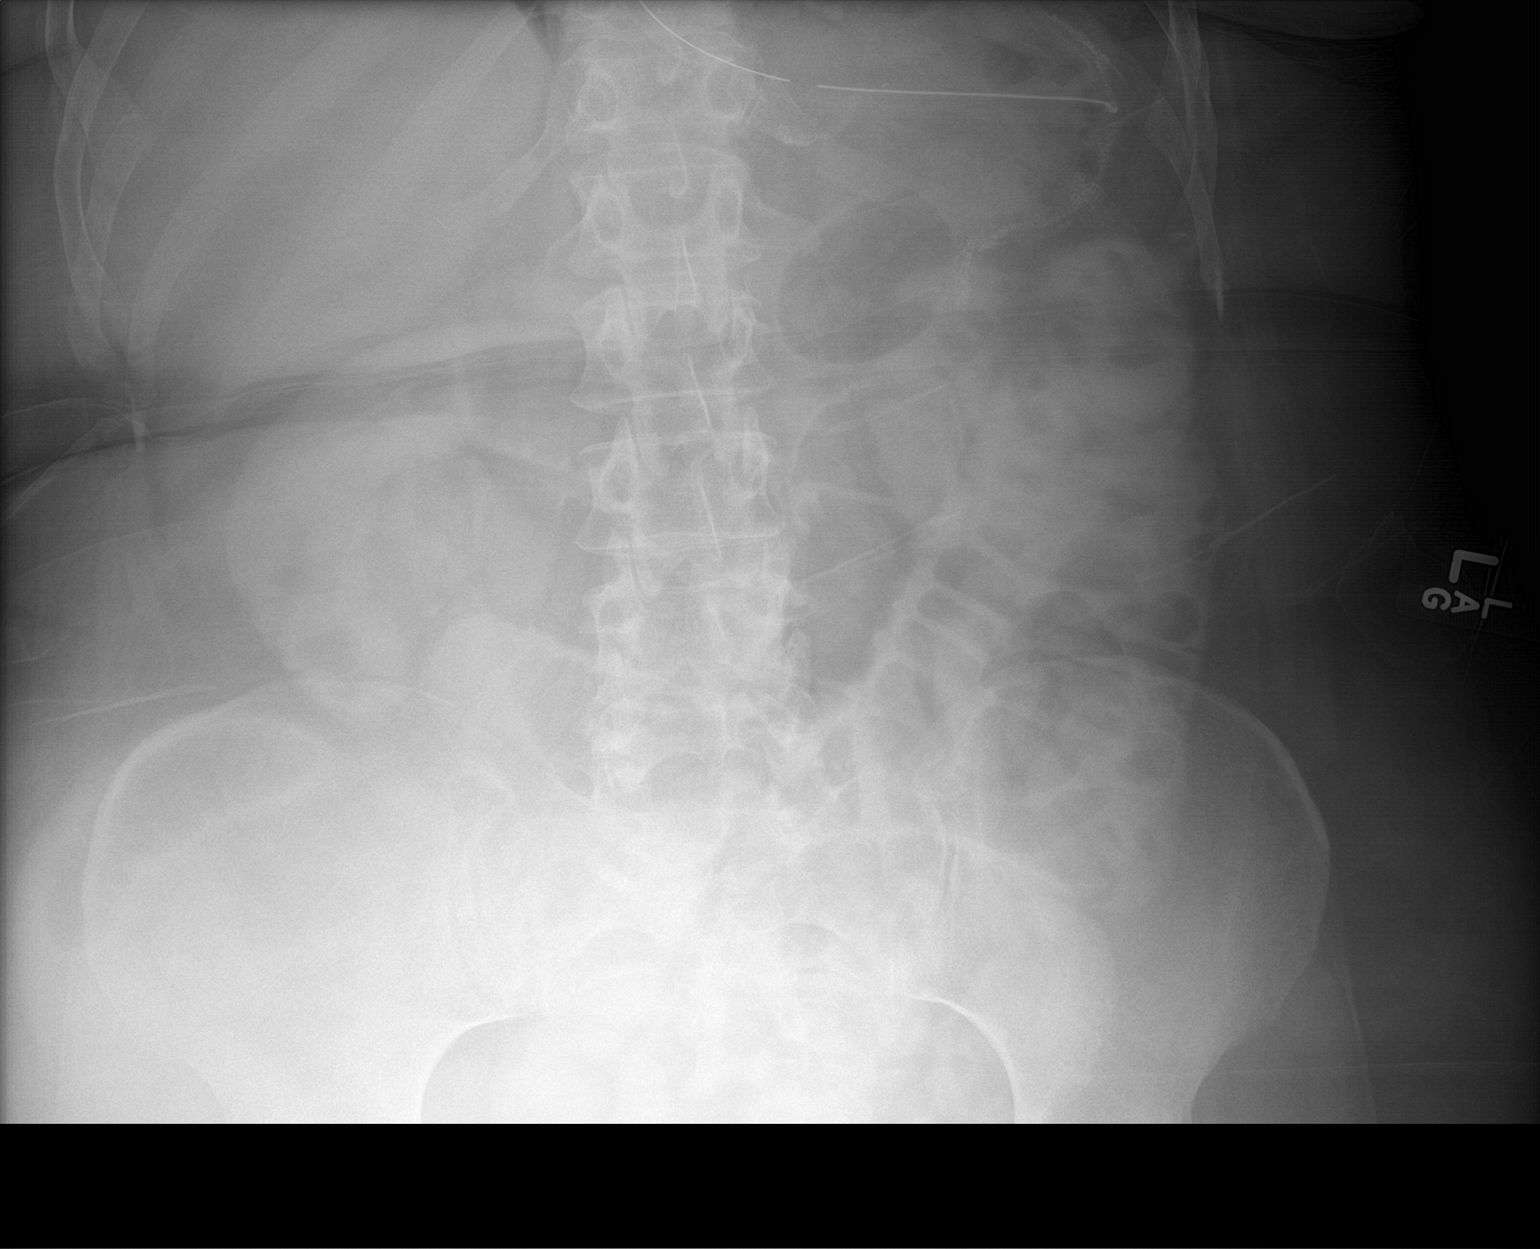

[abdomen kub (2 of 2)]
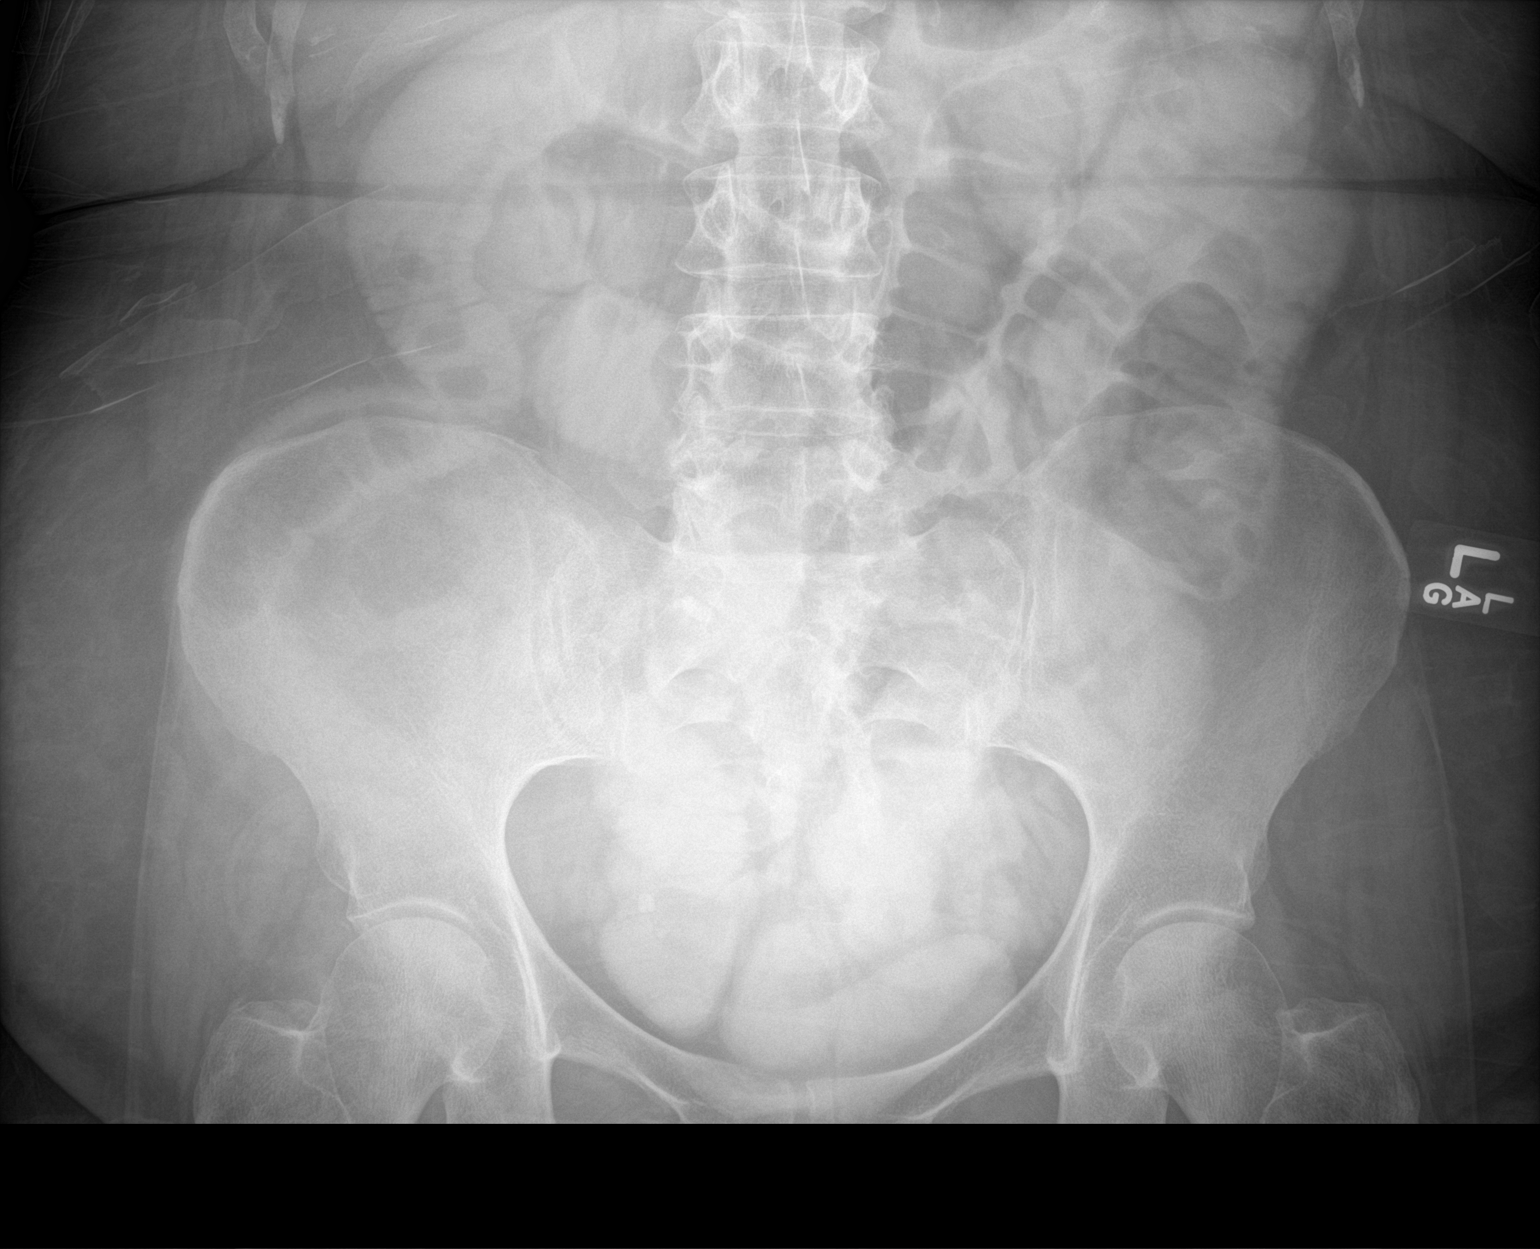

[2 of 2 positions shown; findings below may reference images not displayed]

FINDINGS: Enteric tube in place with tip and side-port below the diaphragm.
Administered enteric contrast within prominent central small bowel.
No evidence of free air.
IMPRESSION: Administered enteric contrast within small bowel in the central
abdomen. Recommend 24 hour delayed film. Please note this patient is
post total colectomy with right lower quadrant ileostomy. 24 hour
film should focus on the right abdomen to evaluate for ileostomy
transit.

## 2019-08-09 ENCOUNTER — Other Ambulatory Visit: Payer: Self-pay

## 2019-08-09 ENCOUNTER — Ambulatory Visit: Payer: Medicare Other | Admitting: Internal Medicine

## 2019-08-09 ENCOUNTER — Encounter: Payer: Self-pay | Admitting: Internal Medicine

## 2019-08-09 DIAGNOSIS — G8929 Other chronic pain: Secondary | ICD-10-CM | POA: Diagnosis not present

## 2019-08-09 DIAGNOSIS — E559 Vitamin D deficiency, unspecified: Secondary | ICD-10-CM | POA: Diagnosis not present

## 2019-08-09 DIAGNOSIS — E538 Deficiency of other specified B group vitamins: Secondary | ICD-10-CM | POA: Diagnosis not present

## 2019-08-09 DIAGNOSIS — N1832 Chronic kidney disease, stage 3b: Secondary | ICD-10-CM | POA: Diagnosis not present

## 2019-08-09 DIAGNOSIS — F411 Generalized anxiety disorder: Secondary | ICD-10-CM

## 2019-08-09 DIAGNOSIS — I10 Essential (primary) hypertension: Secondary | ICD-10-CM | POA: Diagnosis not present

## 2019-08-09 DIAGNOSIS — M5440 Lumbago with sciatica, unspecified side: Secondary | ICD-10-CM | POA: Diagnosis not present

## 2019-08-09 LAB — HEPATIC FUNCTION PANEL
ALT: 7 U/L (ref 0–35)
AST: 14 U/L (ref 0–37)
Albumin: 3.9 g/dL (ref 3.5–5.2)
Alkaline Phosphatase: 66 U/L (ref 39–117)
Bilirubin, Direct: 0.1 mg/dL (ref 0.0–0.3)
Total Bilirubin: 0.5 mg/dL (ref 0.2–1.2)
Total Protein: 7.3 g/dL (ref 6.0–8.3)

## 2019-08-09 LAB — BASIC METABOLIC PANEL
BUN: 31 mg/dL — ABNORMAL HIGH (ref 6–23)
CO2: 28 mEq/L (ref 19–32)
Calcium: 9.4 mg/dL (ref 8.4–10.5)
Chloride: 100 mEq/L (ref 96–112)
Creatinine, Ser: 1.19 mg/dL (ref 0.40–1.20)
GFR: 55.28 mL/min — ABNORMAL LOW (ref >60.00)
Glucose, Bld: 86 mg/dL (ref 70–99)
Potassium: 3.8 mEq/L (ref 3.5–5.1)
Sodium: 136 mEq/L (ref 135–145)

## 2019-08-09 LAB — IRON,TIBC AND FERRITIN PANEL
%SAT: 26 % (calc) (ref 16–45)
Ferritin: 90 ng/mL (ref 16–288)
Iron: 69 ug/dL (ref 45–160)
TIBC: 267 mcg/dL (calc) (ref 250–450)

## 2019-08-09 LAB — TSH: TSH: 0.78 u[IU]/mL (ref 0.35–4.50)

## 2019-08-09 LAB — VITAMIN D 25 HYDROXY (VIT D DEFICIENCY, FRACTURES): VITD: 17.18 ng/mL — ABNORMAL LOW (ref 30.00–100.00)

## 2019-08-09 LAB — VITAMIN B12: Vitamin B-12: 361 pg/mL (ref 211–911)

## 2019-08-09 MED ORDER — TRAMADOL HCL 50 MG PO TABS: 50.0000 mg | ORAL_TABLET | Freq: Four times a day (QID) | ORAL | 3 refills | Status: DC | PRN

## 2019-08-09 MED ORDER — CYCLOBENZAPRINE HCL 5 MG PO TABS: ORAL_TABLET | ORAL | 3 refills | Status: DC

## 2019-08-09 MED ORDER — PROMETHAZINE HCL 12.5 MG PO TABS: 12.5000 mg | ORAL_TABLET | Freq: Four times a day (QID) | ORAL | 1 refills | Status: DC | PRN

## 2019-08-09 NOTE — Assessment & Plan Note (Signed)
On B12 

## 2019-08-09 NOTE — Assessment & Plan Note (Signed)
Vit D 

## 2019-08-09 NOTE — Assessment & Plan Note (Signed)
Chronic  Potential benefits of a long term benzodiazepines  use as well as potential risks  and complications were explained to the patient and were aknowledged.

## 2019-08-09 NOTE — Assessment & Plan Note (Signed)
Labs

## 2019-08-09 NOTE — Addendum Note (Signed)
Addended by: Cresenciano Lick on: 08/09/2019 11:28 AM   Modules accepted: Orders

## 2019-08-09 NOTE — Assessment & Plan Note (Signed)
BP Readings from Last 3 Encounters:  08/09/19 130/78  07/11/18 134/84  04/04/18 132/88

## 2019-08-09 NOTE — Assessment & Plan Note (Signed)
On Tramadol 50-100 mg prn   Potential benefits of a long term opioids use as well as potential risks (i.e. addiction risk, apnea etc) and complications (i.e. Somnolence, constipation and others) were explained to the patient and were aknowledged.

## 2019-08-09 NOTE — Progress Notes (Signed)
Subjective:  Patient ID: Emily Sanchez, female    DOB: 05/10/1955  Age: 64 y.o. MRN: [id number]  CC: No chief complaint on file.   HPI Nishika Parkhurst presents for SBO episode - it has resolved Loosing wt on diet Stress at home w/Woody - 24/7 can't sleep, has to pull him  Outpatient Medications Prior to Visit  Medication Sig Dispense Refill  . allopurinol (ZYLOPRIM) 100 MG tablet TAKE 1 TABLET BY MOUTH EVERY DAY 90 tablet 1  . cholecalciferol (VITAMIN D) 1000 units tablet Take 1,000 Units by mouth daily.    . colchicine 0.6 MG tablet TAKE 1 TABLET BY MOUTH EVERY DAY 90 tablet 1  . cyanocobalamin (,VITAMIN B-12,) 1000 MCG/ML injection Inject 1 mL (1,000 mcg total) into the muscle every 14 (fourteen) days. 20 mL 3  . cyclobenzaprine (FLEXERIL) 5 MG tablet TAKE 1 TABLET BY MOUTH EVERYDAY AT BEDTIME 30 tablet 3  . diclofenac Sodium (VOLTAREN) 1 % GEL Apply 2 g topically 4 (four) times daily. Office visit needed before refills will be given 100 g 0  . hydrochlorothiazide (MICROZIDE) 12.5 MG capsule TAKE 1 CAPSULE BY MOUTH EVERY DAY 90 capsule 0  . Multiple Vitamin (MULTIVITAMIN WITH MINERALS) TABS tablet Take 1 tablet by mouth daily.    . pantoprazole (PROTONIX) 40 MG tablet TAKE 1 TABLET BY MOUTH EVERY DAY 90 tablet 3  . phentermine (ADIPEX-P) 37.5 MG tablet TAKE 1 TABLET BY MOUTH DAILY BEFORE BREAKFAST 30 tablet 2  . SYRINGE-NEEDLE, DISP, 3 ML (BD INTEGRA SYRINGE) 25G X 1" 3 ML MISC TO BE USED FOR B12 INJECTIONS 50 each 3  . traMADol (ULTRAM) 50 MG tablet TAKE 1 TABLET (50 MG TOTAL) BY MOUTH EVERY 6 (SIX) HOURS AS NEEDED FOR SEVERE PAIN. 120 tablet 1  . promethazine (PHENERGAN) 12.5 MG tablet Take 1 tablet (12.5 mg total) by mouth every 6 (six) hours as needed for up to 7 days for nausea or vomiting. 20 tablet 0   No facility-administered medications prior to visit.    ROS: Review of Systems  Constitutional: Negative for activity change, appetite change, chills, fatigue and unexpected  weight change.  HENT: Negative for congestion, mouth sores and sinus pressure.   Eyes: Negative for visual disturbance.  Respiratory: Negative for cough and chest tightness.   Gastrointestinal: Negative for abdominal pain and nausea.  Genitourinary: Negative for difficulty urinating, frequency and vaginal pain.  Musculoskeletal: Negative for back pain and gait problem.  Skin: Negative for pallor and rash.  Neurological: Negative for dizziness, tremors, weakness, numbness and headaches.  Psychiatric/Behavioral: Negative for confusion and sleep disturbance.    Objective:  BP 130/78 (BP Location: Left Arm, Patient Position: Sitting, Cuff Size: Large)   Pulse 90   Temp 98.2 F (36.8 C) (Oral)   Ht 5' 5"  (1.651 m)   Wt 193 lb (87.5 kg)   SpO2 98%   BMI 32.12 kg/m   BP Readings from Last 3 Encounters:  08/09/19 130/78  07/11/18 134/84  04/04/18 132/88    Wt Readings from Last 3 Encounters:  08/09/19 193 lb (87.5 kg)  07/11/18 210 lb (95.3 kg)  04/04/18 208 lb (94.3 kg)    Physical Exam Constitutional:      General: She is not in acute distress.    Appearance: She is well-developed.  HENT:     Head: Normocephalic.     Right Ear: External ear normal.     Left Ear: External ear normal.     Nose: Nose normal.  Eyes:     General:        Right eye: No discharge.        Left eye: No discharge.     Conjunctiva/sclera: Conjunctivae normal.     Pupils: Pupils are equal, round, and reactive to light.  Neck:     Thyroid: No thyromegaly.     Vascular: No JVD.     Trachea: No tracheal deviation.  Cardiovascular:     Rate and Rhythm: Normal rate and regular rhythm.     Heart sounds: Normal heart sounds.  Pulmonary:     Effort: No respiratory distress.     Breath sounds: No stridor. No wheezing.  Abdominal:     General: Bowel sounds are normal. There is distension.     Palpations: Abdomen is soft. There is no mass.     Tenderness: There is no abdominal tenderness. There is no  guarding or rebound.  Musculoskeletal:        General: Tenderness present.     Cervical back: Normal range of motion and neck supple.  Lymphadenopathy:     Cervical: No cervical adenopathy.  Skin:    Findings: No erythema or rash.  Neurological:     Cranial Nerves: No cranial nerve deficit.     Motor: No abnormal muscle tone.     Coordination: Coordination normal.     Deep Tendon Reflexes: Reflexes normal.  Psychiatric:        Behavior: Behavior normal.        Thought Content: Thought content normal.        Judgment: Judgment normal.   ostomy back  Lab Results  Component Value Date   WBC 6.3 01/05/2018   HGB 11.9 (L) 01/05/2018   HCT 36.1 01/05/2018   PLT 272.0 01/05/2018   GLUCOSE 96 01/05/2018   CHOL 172 05/27/2010   TRIG 100.0 05/27/2010   HDL 46.30 05/27/2010   LDLCALC 106 (H) 05/27/2010   ALT 17 06/22/2017   AST 18 06/22/2017   NA 139 01/05/2018   K 4.4 01/05/2018   CL 105 01/05/2018   CREATININE 1.18 01/05/2018   BUN 30 (H) 01/05/2018   CO2 26 01/05/2018   TSH 0.54 02/21/2016   INR 1.0 01/05/2018   HGBA1C 5.9 12/17/2009   MICROALBUR 0.6 02/10/2013    MM 3D SCREEN BREAST BILATERAL  Result Date: 06/09/2018 CLINICAL DATA:  Screening. EXAM: DIGITAL SCREENING BILATERAL MAMMOGRAM WITH TOMO AND CAD COMPARISON:  Previous exam(s). ACR Breast Density Category b: There are scattered areas of fibroglandular density. FINDINGS: There are no findings suspicious for malignancy. Images were processed with CAD. IMPRESSION: No mammographic evidence of malignancy. A result letter of this screening mammogram will be mailed directly to the patient. RECOMMENDATION: Screening mammogram in one year. (Code:SM-B-01Y) BI-RADS CATEGORY  1: Negative. Electronically Signed   By: Abelardo Diesel M.D.   On: 06/09/2018 15:00    Assessment & Plan:   There are no diagnoses linked to this encounter.   No orders of the defined types were placed in this encounter.    Follow-up: No follow-ups  on file.  Walker Kehr, MD

## 2019-08-10 LAB — CBC WITH DIFFERENTIAL/PLATELET
Basophils Absolute: 0.1 10*3/uL (ref 0.0–0.1)
Basophils Relative: 1.2 % (ref 0.0–3.0)
Eosinophils Absolute: 0.3 10*3/uL (ref 0.0–0.7)
Eosinophils Relative: 6.4 % — ABNORMAL HIGH (ref 0.0–5.0)
HCT: 37.1 % (ref 36.0–46.0)
Hemoglobin: 12.2 g/dL (ref 12.0–15.0)
Lymphocytes Relative: 36.5 % (ref 12.0–46.0)
Lymphs Abs: 1.8 10*3/uL (ref 0.7–4.0)
MCHC: 33 g/dL (ref 30.0–36.0)
MCV: 98.7 fl (ref 78.0–100.0)
Monocytes Absolute: 0.6 10*3/uL (ref 0.1–1.0)
Monocytes Relative: 12.7 % — ABNORMAL HIGH (ref 3.0–12.0)
Neutro Abs: 2.1 10*3/uL (ref 1.4–7.7)
Neutrophils Relative %: 43.2 % (ref 43.0–77.0)
Platelets: 237 10*3/uL (ref 150.0–400.0)
RBC: 3.76 Mil/uL — ABNORMAL LOW (ref 3.87–5.11)
RDW: 15.6 % — ABNORMAL HIGH (ref 11.5–15.5)
WBC: 4.8 10*3/uL (ref 4.0–10.5)

## 2019-08-15 ENCOUNTER — Other Ambulatory Visit: Payer: Self-pay | Admitting: Internal Medicine

## 2019-08-15 MED ORDER — B COMPLEX PO TABS: 1.0000 | ORAL_TABLET | Freq: Every day | ORAL | 3 refills | Status: DC

## 2019-08-15 MED ORDER — VITAMIN D3 50 MCG (2000 UT) PO CAPS: 2000.0000 [IU] | ORAL_CAPSULE | Freq: Every day | ORAL | 3 refills | Status: DC

## 2019-08-15 MED ORDER — VITAMIN D3 1.25 MG (50000 UT) PO CAPS: 1.0000 | ORAL_CAPSULE | ORAL | 0 refills | Status: DC

## 2019-08-17 ENCOUNTER — Telehealth: Payer: Self-pay | Admitting: Internal Medicine

## 2019-08-17 NOTE — Telephone Encounter (Signed)
I received renewal [employer] attending physician's statement forms.  Forms have been completed & Placed in providers box to review and sign.

## 2019-08-22 DIAGNOSIS — Z0279 Encounter for issue of other medical certificate: Secondary | ICD-10-CM

## 2019-08-22 NOTE — Telephone Encounter (Signed)
Forms have been completed & Faxed, Copy sent to scan and charged.  Patient informed and mailed to patient as requested.

## 2019-10-19 ENCOUNTER — Other Ambulatory Visit: Payer: Self-pay | Admitting: Internal Medicine

## 2019-10-19 DIAGNOSIS — Z1231 Encounter for screening mammogram for malignant neoplasm of breast: Secondary | ICD-10-CM

## 2019-10-25 ENCOUNTER — Ambulatory Visit
Admission: RE | Admit: 2019-10-25 | Discharge: 2019-10-25 | Disposition: A | Discharge status: Home / Self Care | Payer: Medicare Other | Source: Ambulatory Visit | Attending: Internal Medicine | Admitting: Internal Medicine

## 2019-10-25 ENCOUNTER — Other Ambulatory Visit: Payer: Self-pay

## 2019-10-25 DIAGNOSIS — Z1231 Encounter for screening mammogram for malignant neoplasm of breast: Secondary | ICD-10-CM

## 2019-11-15 ENCOUNTER — Other Ambulatory Visit: Payer: Self-pay | Admitting: Internal Medicine

## 2020-01-01 ENCOUNTER — Telehealth: Payer: Self-pay | Admitting: Internal Medicine

## 2020-01-01 NOTE — Telephone Encounter (Signed)
[facility] Dr. Paralee Cancel Surgical clearance paperwork needed, does she need an appointment?  Surgery date: 9.28.21

## 2020-01-03 NOTE — Patient Instructions (Addendum)
DUE TO COVID-19 ONLY ONE VISITOR IS ALLOWED TO COME WITH YOU AND STAY IN THE WAITING ROOM ONLY DURING PRE OP AND PROCEDURE DAY OF SURGERY. THE 1 VISITOR  MAY VISIT WITH YOU AFTER SURGERY IN YOUR PRIVATE ROOM DURING VISITING HOURS ONLY!  YOU NEED TO HAVE A COVID 19 TEST ON Friday 01-12-2020 @_______ , THIS TEST MUST BE DONE BEFORE SURGERY,  COVID TESTING SITE [place] [id number], IT IS ON THE RIGHT GOING OUT [address] APPROXIMATELY  2 MINUTES PAST [employer] ON THE RIGHT. ONCE YOUR COVID TEST IS COMPLETED,  PLEASE BEGIN THE QUARANTINE INSTRUCTIONS AS OUTLINED IN YOUR HANDOUT.                Emily Sanchez    Your procedure is scheduled on: 01-16-2020   Report to [facility] Main  Entrance   Report to  [place]  AM     Call this number if you have problems the morning of surgery [phone]    REMEMBER: NO  SOLID FOOD CANDY OR GUM AFTER MIDNIGHT. CLEAR LIQUIDS UNTIL   415 AM        . NOTHING BY MOUTH EXCEPT CLEAR LIQUIDS UNTIL 415 AM   . PLEASE FINISH ENSURE DRINK PER SURGEON ORDER  WHICH NEEDS TO BE COMPLETED AT   415 AM   .      CLEAR LIQUID DIET   Foods Allowed                                                                    Coffee and tea, regular and decaf                            Fruit ices (not with fruit pulp)                                      Iced Popsicles                                    Carbonated beverages, regular and diet                                    Cranberry, grape and apple juices Sports drinks like Gatorade Lightly seasoned clear broth or consume(fat free) Sugar, honey syrup ___________________________________________________________________      BRUSH YOUR TEETH MORNING OF SURGERY AND RINSE YOUR MOUTH OUT, NO CHEWING GUM CANDY OR MINTS.     Take these medicines the morning of surgery with A SIP OF WATER: PANTAPRAZOLE                                 You may not have any metal on  your body including hair pins and              piercings  Do not wear jewelry, make-up, lotions, powders or perfumes, deodorant  Do not wear nail polish on your fingernails.  Do not shave  48 hours prior to surgery.              Men may shave face and neck.   Do not bring valuables to the hospital. [place].  Contacts, dentures or bridgework may not be worn into surgery.  Leave suitcase in the car. After surgery it may be brought to your room.     Patients discharged the day of surgery will not be allowed to drive home. IF YOU ARE HAVING SURGERY AND GOING HOME THE SAME DAY, YOU MUST HAVE AN ADULT TO DRIVE YOU HOME AND BE WITH YOU FOR 24 HOURS. YOU MAY GO HOME BY TAXI OR UBER OR ORTHERWISE, BUT AN ADULT MUST ACCOMPANY YOU HOME AND STAY WITH YOU FOR 24 HOURS.  Name and phone number of your driver:  Special Instructions: N/A              Please read over the following fact sheets you were given: _____________________________________________________________________  [facility] - Preparing for Surgery Before surgery, you can play an important role.  Because skin is not sterile, your skin needs to be as free of germs as possible.  You can reduce the number of germs on your skin by washing with CHG (chlorahexidine gluconate) soap before surgery.  CHG is an antiseptic cleaner which kills germs and bonds with the skin to continue killing germs even after washing. Please DO NOT use if you have an allergy to CHG or antibacterial soaps.  If your skin becomes reddened/irritated stop using the CHG and inform your nurse when you arrive at Short Stay. Do not shave (including legs and underarms) for at least 48 hours prior to the first CHG shower.  You may shave your face/neck. Please follow these instructions carefully:  1.  Shower with CHG Soap the night before surgery and the  morning of Surgery.  2.  If you choose to wash your hair, wash your  hair first as usual with your  normal  shampoo.  3.  After you shampoo, rinse your hair and body thoroughly to remove the  shampoo.                           4.  Use CHG as you would any other liquid soap.  You can apply chg directly  to the skin and wash                       Gently with a scrungie or clean washcloth.  5.  Apply the CHG Soap to your body ONLY FROM THE NECK DOWN.   Do not use on face/ open                           Wound or open sores. Avoid contact with eyes, ears mouth and genitals (private parts).                       Wash face,  Genitals (private parts) with your normal soap.             6.  Wash thoroughly, paying special attention to the area where your surgery  will be performed.  7.  Thoroughly rinse your body with  warm water from the neck down.  8.  DO NOT shower/wash with your normal soap after using and rinsing off  the CHG Soap.                9.  Pat yourself dry with a clean towel.            10.  Wear clean pajamas.            11.  Place clean sheets on your bed the night of your first shower and do not  sleep with pets. Day of Surgery : Do not apply any lotions/deodorants the morning of surgery.  Please wear clean clothes to the hospital/surgery center.  FAILURE TO FOLLOW THESE INSTRUCTIONS MAY RESULT IN THE CANCELLATION OF YOUR SURGERY PATIENT SIGNATURE_________________________________  NURSE SIGNATURE__________________________________  ________________________________________________________________________

## 2020-01-03 NOTE — Telephone Encounter (Signed)
Pt last OV 08/09/19, does she need and OV for surgical clarence?

## 2020-01-04 ENCOUNTER — Other Ambulatory Visit: Payer: Self-pay

## 2020-01-04 ENCOUNTER — Encounter (HOSPITAL_COMMUNITY)
Admission: RE | Admit: 2020-01-04 | Discharge: 2020-01-04 | Disposition: A | Discharge status: Home / Self Care | Payer: Medicare Other | Source: Ambulatory Visit | Attending: Orthopedic Surgery | Admitting: Orthopedic Surgery

## 2020-01-04 ENCOUNTER — Encounter (HOSPITAL_COMMUNITY): Payer: Self-pay

## 2020-01-04 DIAGNOSIS — I10 Essential (primary) hypertension: Secondary | ICD-10-CM | POA: Insufficient documentation

## 2020-01-04 DIAGNOSIS — Z01818 Encounter for other preprocedural examination: Secondary | ICD-10-CM | POA: Insufficient documentation

## 2020-01-04 HISTORY — DX: Anemia, unspecified: D64.9

## 2020-01-04 HISTORY — DX: Nausea with vomiting, unspecified: R11.2

## 2020-01-04 HISTORY — DX: Other specified postprocedural states: Z98.890

## 2020-01-04 HISTORY — DX: Pneumonia, unspecified organism: J18.9

## 2020-01-04 LAB — TYPE AND SCREEN
ABO/RH(D): O POS
Antibody Screen: NEGATIVE

## 2020-01-04 LAB — CBC
HCT: 37.4 % (ref 36.0–46.0)
Hemoglobin: 12 g/dL (ref 12.0–15.0)
MCH: 32.6 pg (ref 26.0–34.0)
MCHC: 32.1 g/dL (ref 30.0–36.0)
MCV: 101.6 fL — ABNORMAL HIGH (ref 80.0–100.0)
Platelets: 280 10*3/uL (ref 150–400)
RBC: 3.68 MIL/uL — ABNORMAL LOW (ref 3.87–5.11)
RDW: 13.4 % (ref 11.5–15.5)
WBC: 7.3 10*3/uL (ref 4.0–10.5)
nRBC: 0 % (ref 0.0–0.2)

## 2020-01-04 LAB — BASIC METABOLIC PANEL
Anion gap: 10 (ref 5–15)
BUN: 35 mg/dL — ABNORMAL HIGH (ref 8–23)
CO2: 24 mmol/L (ref 22–32)
Calcium: 9.7 mg/dL (ref 8.9–10.3)
Chloride: 105 mmol/L (ref 98–111)
Creatinine, Ser: 1.22 mg/dL — ABNORMAL HIGH (ref 0.44–1.00)
GFR calc Af Amer: 54 mL/min — ABNORMAL LOW (ref >60)
GFR calc non Af Amer: 47 mL/min — ABNORMAL LOW (ref >60)
Glucose, Bld: 96 mg/dL (ref 70–99)
Potassium: 4 mmol/L (ref 3.5–5.1)
Sodium: 139 mmol/L (ref 135–145)

## 2020-01-04 LAB — SURGICAL PCR SCREEN
MRSA, PCR: NEGATIVE
Staphylococcus aureus: NEGATIVE

## 2020-01-04 NOTE — Telephone Encounter (Signed)
OV Mon -Tue? Thx

## 2020-01-04 NOTE — Progress Notes (Signed)
DUE TO COVID-19 ONLY ONE VISITOR IS ALLOWED TO COME WITH YOU AND STAY IN THE WAITING ROOM ONLY DURING PRE OP AND PROCEDURE DAY OF SURGERY. THE 1 VISITOR  MAY VISIT WITH YOU AFTER SURGERY IN YOUR PRIVATE ROOM DURING VISITING HOURS ONLY!  YOU NEED TO HAVE A COVID 19 TEST ON_______ @_______ , THIS TEST MUST BE DONE BEFORE SURGERY,  COVID TESTING SITE [address] [place] [address], IT IS ON THE RIGHT GOING OUT [address] APPROXIMATELY  2 MINUTES PAST [employer] ON THE RIGHT. ONCE YOUR COVID TEST IS COMPLETED,  PLEASE BEGIN THE QUARANTINE INSTRUCTIONS AS OUTLINED IN YOUR HANDOUT.                Emily Sanchez  01/04/2020   Your procedure is scheduled on: 01/16/20   Report to [facility] Main  Entrance   Report to admitting at     0530 AM     Call this number if you have problems the morning of surgery [phone]    REMEMBER: NO  SOLID FOOD CANDY OR GUM AFTER MIDNIGHT. CLEAR LIQUIDS UNTIL    0415am       . NOTHING BY MOUTH EXCEPT CLEAR LIQUIDS UNTIL    . PLEASE FINISH ENSURE DRINK PER SURGEON ORDER  WHICH NEEDS TO BE COMPLETED AT 0415am     .      CLEAR LIQUID DIET   Foods Allowed                                                                    Coffee and tea, regular and decaf                            Fruit ices (not with fruit pulp)                                      Iced Popsicles                                    Carbonated beverages, regular and diet                                    Cranberry, grape and apple juices Sports drinks like Gatorade Lightly seasoned clear broth or consume(fat free) Sugar, honey syrup ___________________________________________________________________      BRUSH YOUR TEETH MORNING OF SURGERY AND RINSE YOUR MOUTH OUT, NO CHEWING GUM CANDY OR MINTS.     Take these medicines the morning of surgery with A SIP OF WATER:   DO NOT TAKE ANY DIABETIC MEDICATIONS DAY OF YOUR SURGERY                                You may not have any metal on your body including hair pins and              piercings  Do not wear jewelry,  make-up, lotions, powders or perfumes, deodorant             Do not wear nail polish on your fingernails.  Do not shave  48 hours prior to surgery.              Men may shave face and neck.   Do not bring valuables to the hospital. [place].  Contacts, dentures or bridgework may not be worn into surgery.  Leave suitcase in the car. After surgery it may be brought to your room.     Patients discharged the day of surgery will not be allowed to drive home. IF YOU ARE HAVING SURGERY AND GOING HOME THE SAME DAY, YOU MUST HAVE AN ADULT TO DRIVE YOU HOME AND BE WITH YOU FOR 24 HOURS. YOU MAY GO HOME BY TAXI OR UBER OR ORTHERWISE, BUT AN ADULT MUST ACCOMPANY YOU HOME AND STAY WITH YOU FOR 24 HOURS.  Name and phone number of your driver:  Special Instructions: N/A              Please read over the following fact sheets you were given: _____________________________________________________________________  [facility] - Preparing for Surgery Before surgery, you can play an important role.  Because skin is not sterile, your skin needs to be as free of germs as possible.  You can reduce the number of germs on your skin by washing with CHG (chlorahexidine gluconate) soap before surgery.  CHG is an antiseptic cleaner which kills germs and bonds with the skin to continue killing germs even after washing. Please DO NOT use if you have an allergy to CHG or antibacterial soaps.  If your skin becomes reddened/irritated stop using the CHG and inform your nurse when you arrive at Short Stay. Do not shave (including legs and underarms) for at least 48 hours prior to the first CHG shower.  You may shave your face/neck. Please follow these instructions carefully:  1.  Shower with CHG Soap the night before surgery and the  morning of Surgery.  2.  If you  choose to wash your hair, wash your hair first as usual with your  normal  shampoo.  3.  After you shampoo, rinse your hair and body thoroughly to remove the  shampoo.                           4.  Use CHG as you would any other liquid soap.  You can apply chg directly  to the skin and wash                       Gently with a scrungie or clean washcloth.  5.  Apply the CHG Soap to your body ONLY FROM THE NECK DOWN.   Do not use on face/ open                           Wound or open sores. Avoid contact with eyes, ears mouth and genitals (private parts).                       Wash face,  Genitals (private parts) with your normal soap.             6.  Wash thoroughly, paying special attention  to the area where your surgery  will be performed.  7.  Thoroughly rinse your body with warm water from the neck down.  8.  DO NOT shower/wash with your normal soap after using and rinsing off  the CHG Soap.                9.  Pat yourself dry with a clean towel.            10.  Wear clean pajamas.            11.  Place clean sheets on your bed the night of your first shower and do not  sleep with pets. Day of Surgery : Do not apply any lotions/deodorants the morning of surgery.  Please wear clean clothes to the hospital/surgery center.  FAILURE TO FOLLOW THESE INSTRUCTIONS MAY RESULT IN THE CANCELLATION OF YOUR SURGERY PATIENT SIGNATURE_________________________________  NURSE SIGNATURE__________________________________  ________________________________________________________________________

## 2020-01-04 NOTE — Progress Notes (Addendum)
Anesthesia Review:  PCP: Plotnikov LOV 08/09/19-epic  Cardiologist :none  Chest x-ray : EKG :01/04/2020  Echo : Stress test: Cardiac Cath :  Activity level: if it we not for knee pt could do a flight  of stairs without difficulty  Sleep Study/ CPAP :no Fasting Blood Sugar :      / Checks Blood Sugar -- times a day:   Blood Thinner/ Instructions /Last Dose: ASA / Instructions/ Last Dose :  Patient states was taken off blood pressure meds approx 3 months ago due to blood pressure was running low.  Blood pressure at preop appt was 149/95.  Patient denies any headaches or dizzines.Marland Kitchen

## 2020-01-05 NOTE — H&P (Signed)
TOTAL KNEE ADMISSION H&P  Patient is being admitted for right total knee arthroplasty and left knee cortisone injection.  Subjective:  Chief Complaint:  Bilateral knee pain.  HPI: Emily Sanchez, 64 y.o. female, has a history of pain and functional disability in the bilateral knees due to arthritis and has failed non-surgical conservative treatments for greater than 12 weeks to include corticosteriod injections, use of assistive devices and activity modification.  Onset of symptoms was gradual, starting  years ago with gradually worsening course since that time. The patient noted prior procedures on the knee to include  arthroscopy on the right knee(s).  Patient currently rates pain in the right knee(s) at 10 out of 10 with activity. Patient has night pain, worsening of pain with activity and weight bearing, pain that interferes with activities of daily living, pain with passive range of motion, crepitus and joint swelling.  Patient has evidence of periarticular osteophytes and joint space narrowing by imaging studies.   There is no active infection.  Risks, benefits and expectations were discussed with the patient.  Risks including but not limited to the risk of anesthesia, blood clots, nerve damage, blood vessel damage, failure of the prosthesis, infection and up to and including death.  Patient understand the risks, benefits and expectations and wishes to proceed with surgery.   PCP: Plotnikov, Evie Lacks, MD  D/C Plans:       Home   Post-op Meds:       No Rx given   Tranexamic Acid:      To be given - IV   Decadron:      Is to be given  FYI:      ASA  Norco  Zofran  DME:   Rx sent for - RW & 3-n-1  PT:   OPPT   Pharmacy: [facility].   Patient Active Problem List   Diagnosis Date Noted  . Colostomy and enterostomy malfunction (Home) 02/10/2018  . Gout attack 08/18/2017  . Abdominal wall abscess at site of surgical wound   . Surgical site infection 06/22/2017   . SBO (small bowel obstruction) ([place]) 06/08/2017  . Fracture of multiple ribs of left side 09/03/2015  . Liver laceration 09/03/2015  . Adrenal hemorrhage ([place]) 09/03/2015  . AKI (acute kidney injury) ([place]) 09/03/2015  . Kidney laceration, right 08/31/2015  . 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 Status post total colectomy and ileostomy for Crohn's 03/21/2015  . Ventral hernia 03/21/2015  . Well adult exam 02/27/2015  . Allergic rhinitis 08/16/2014  . Enteritis due to Clostridium difficile 08/16/2012  . Nausea 08/16/2012  . Angular stomatitis 03/30/2012  . Anemia, chronic disease 04/08/2011  . CRI (chronic renal insufficiency) 04/08/2011  . Osteoarthritis 06/18/2009  . PNEUMONIA 11/06/2008  . Fibromyalgia syndrome 08/13/2008  . OBESITY, MORBID 01/20/2008  . INSOMNIA, PERSISTENT 10/18/2007  . Chronic fatigue syndrome 10/18/2007  . Vitamin D deficiency 07/15/2007  . Anxiety state 02/17/2007  . GERD 02/17/2007  . LOW BACK PAIN 02/17/2007  . B12 deficiency 11/08/2006  . PANIC ATTACK 11/08/2006  . DEPRESSION 11/08/2006  . Essential hypertension 11/08/2006  . Crohn's disease ([place]) 11/08/2006   Past Medical History:  Diagnosis Date  . Anemia   . Anxiety   . Chronic kidney disease    stage 3 - [facility]   . Crohn's disease ([place])   . Depression   . Enteritis due to Clostridium difficile 08/16/2012  . 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  GERD (gastroesophageal reflux disease)   . HTN (hypertension)    lst blood pressure medication was 3 months ago   . Ileostomy in place [facility])   . Low back pain    FMS  . Osteoarthritis    Dr Alvan Dame; both knees  . Perianal pain    Chronic, Post-Op  . Pneumonia    as a child   . PONV (postoperative nausea and vomiting)   . Transfusion history    none recent  . Ulcerative colitis ([place])    Dr Sharlett Iles    Past Surgical History:  Procedure Laterality Date  . BREATH TEK H PYLORI N/A 12/05/2013   Procedure:  BREATH TEK H PYLORI;  Surgeon: Pedro Earls, MD;  Location: [facility];  Service: General;  Laterality: N/A;  . COLECTOMY  2001   ulcerative colitis  . ILEOSTOMY    . LAPAROSCOPIC GASTRIC SLEEVE RESECTION WITH HIATAL HERNIA REPAIR N/A 04/02/2015   Procedure: LAPAROSCOPIC LYSIS OF ADHESIONS GASTRIC SLEEVE RESECTION WITH OPEN VENTRAL HERNIA REPAIR;  Surgeon: Johnathan Hausen, MD;  Location: [facility];  Service: General;  Laterality: N/A;  . LAPAROSCOPIC LYSIS OF ADHESIONS  04/02/2015   Procedure: LAPAROSCOPIC LYSIS OF ADHESIONS;  Surgeon: Johnathan Hausen, MD;  Location: [facility];  Service: General;;  . LAPAROTOMY N/A 06/11/2017   Procedure: EXPLORATORY LAPAROTOMY, LYSIS OF ADHESIONS, REPAIR OF PEISTOMAL HERNIA;  Surgeon: Leighton Ruff, MD;  Location: [facility];  Service: General;  Laterality: N/A;  . TONSILLECTOMY    . TUBAL LIGATION    . UPPER GI ENDOSCOPY  04/02/2015   Procedure: UPPER GI ENDOSCOPY;  Surgeon: Johnathan Hausen, MD;  Location: [facility];  Service: General;;  . VENTRAL HERNIA REPAIR  04/02/2015   Procedure: HERNIA REPAIR VENTRAL ADULT;  Surgeon: Johnathan Hausen, MD;  Location: [facility];  Service: General;;    No current facility-administered medications for this encounter.   Current Outpatient Medications  Medication Sig Dispense Refill Last Dose  . acetaminophen (TYLENOL) 500 MG tablet Take 500-1,000 mg by mouth every 6 (six) hours as needed (for pain.).     Marland Kitchen cyanocobalamin (,VITAMIN B-12,) 1000 MCG/ML injection Inject 1 mL (1,000 mcg total) into the muscle every 14 (fourteen) days. 20 mL 3   . Lidocaine (ASPERCREME MAX STRENGTH EX) Apply 1 application topically 3 (three) times daily as needed (pain.).     Marland Kitchen pantoprazole (PROTONIX) 40 MG tablet TAKE 1 TABLET BY MOUTH EVERY DAY (Patient taking differently: Take 40 mg by mouth daily as needed (upset stomach/indigestion.). ) 90 tablet 3   . allopurinol (ZYLOPRIM) 100 MG tablet TAKE 1 TABLET BY MOUTH EVERY DAY (Patient not taking: Reported on  12/29/2019) 90 tablet 1 Not Taking at Unknown time  . b complex vitamins tablet Take 1 tablet by mouth daily. (Patient not taking: Reported on 12/29/2019) 100 tablet 3 Not Taking at Unknown time  . Cholecalciferol (VITAMIN D3) 1.25 MG (50000 UT) CAPS Take 1 capsule by mouth once a week. (Patient not taking: Reported on 12/29/2019) 8 capsule 0 Not Taking at Unknown time  . Cholecalciferol (VITAMIN D3) 50 MCG (2000 UT) capsule Take 1 capsule (2,000 Units total) by mouth daily. (Patient not taking: Reported on 12/29/2019) 100 capsule 3 Not Taking at Unknown time  . colchicine 0.6 MG tablet TAKE 1 TABLET BY MOUTH EVERY DAY (Patient not taking: Reported on 12/29/2019) 90 tablet 1 Not Taking at Unknown time  . cyclobenzaprine (FLEXERIL) 5 MG tablet TAKE 1 TABLET BY MOUTH EVERYDAY AT BEDTIME (Patient not taking:  Reported on 12/29/2019) 30 tablet 3 Not Taking at Unknown time  . phentermine (ADIPEX-P) 37.5 MG tablet TAKE 1 TABLET BY MOUTH DAILY BEFORE BREAKFAST (Patient not taking: Reported on 12/29/2019) 30 tablet 2 Not Taking at Unknown time  . promethazine (PHENERGAN) 12.5 MG tablet Take 1 tablet (12.5 mg total) by mouth every 6 (six) hours as needed for up to 7 days for nausea or vomiting. (Patient not taking: Reported on 12/29/2019) 20 tablet 1 Not Taking at Unknown time  . SYRINGE-NEEDLE, DISP, 3 ML (BD INTEGRA SYRINGE) 25G X 1" 3 ML MISC TO BE USED FOR B12 INJECTIONS 50 each 3   . traMADol (ULTRAM) 50 MG tablet Take 1 tablet (50 mg total) by mouth every 6 (six) hours as needed for severe pain. (Patient not taking: Reported on 12/29/2019) 120 tablet 3 Not Taking at Unknown time   Allergies  Allergen Reactions  . Amphetamine-Dextroamphet Er Nausea And Vomiting  . Cymbalta [Duloxetine Hcl] Other (See Comments)    "Made me sleepy."  . Erythromycin Ethylsuccinate Nausea And Vomiting  . Morphine Nausea And Vomiting    Social History   Tobacco Use  . Smoking status: Former Research scientist (life sciences)  . Smokeless tobacco: Never  Used  . Tobacco comment: very light social only-quit many years ago  Substance Use Topics  . Alcohol use: No    Family History  Problem Relation Age of Onset  . Heart disease Father 104       CHF  . Depression Sister   . Hypertension Other      Review of Systems  Constitutional: Negative.   HENT: Negative.   Eyes: Negative.   Respiratory: Negative.   Cardiovascular: Negative.   Gastrointestinal: Positive for heartburn.  Genitourinary: Negative.   Musculoskeletal: Positive for joint pain.  Skin: Negative.   Neurological: Negative.   Endo/Heme/Allergies: Negative.   Psychiatric/Behavioral: Positive for depression. The patient is nervous/anxious.      Objective:  Physical Exam Constitutional:      Appearance: She is well-developed.  HENT:     Head: Normocephalic.  Eyes:     Pupils: Pupils are equal, round, and reactive to light.  Neck:     Thyroid: No thyromegaly.     Vascular: No JVD.     Trachea: No tracheal deviation.  Cardiovascular:     Rate and Rhythm: Normal rate and regular rhythm.  Pulmonary:     Effort: Pulmonary effort is normal. No respiratory distress.     Breath sounds: Normal breath sounds. No wheezing.  Abdominal:     Palpations: Abdomen is soft.     Tenderness: There is no abdominal tenderness. There is no guarding.  Musculoskeletal:     Cervical back: Neck supple.     Right knee: Swelling and bony tenderness present. No effusion or erythema. Decreased range of motion. Tenderness present.  Lymphadenopathy:     Cervical: No cervical adenopathy.  Skin:    General: Skin is warm and dry.  Neurological:     Mental Status: She is alert and oriented to person, place, and time.     Vital signs in last 24 hours: Temp:  [98.4 F (36.9 C)] 98.4 F (36.9 C) (09/16 1209) Pulse Rate:  [92] 92 (09/16 1209) BP: (149)/(95) 149/95 (09/16 1209) SpO2:  [100 %] 100 % (09/16 1209) Weight:  [88.9 kg] 88.9 kg (09/16 1209)  Labs:   Estimated body mass  index is 32.12 kg/m as calculated from the following:   Height as of 01/04/20: 5' 5.5" (  1.664 m).   Weight as of 01/04/20: 88.9 kg.   Imaging Review Plain radiographs demonstrate severe degenerative joint disease of the right knee.  The bone quality appears to be good for age and reported activity level.      Assessment/Plan:  End stage arthritis, right knee   The patient history, physical examination, clinical judgment of the provider and imaging studies are consistent with end stage degenerative joint disease of the right knee(s) and total knee arthroplasty is deemed medically necessary. The treatment options including medical management, injection therapy arthroscopy and arthroplasty were discussed at length. The risks and benefits of total knee arthroplasty were presented and reviewed. The risks due to aseptic loosening, infection, stiffness, patella tracking problems, thromboembolic complications and other imponderables were discussed. The patient acknowledged the explanation, agreed to proceed with the plan and consent was signed. Patient is being admitted for treatment for surgery, pain control, PT, OT, prophylactic antibiotics, VTE prophylaxis, progressive ambulation and ADL's and discharge planning. The patient is planning to be discharged home.     [name] anticipated LOS is less than 2 midnights, meeting these requirements: - Younger than 23 - Lives within 1 hour of care - Has a competent adult at home to recover with post-op recover - NO history of  - Chronic pain requiring opiods  - Diabetes  - Coronary Artery Disease  - Heart failure  - Heart attack  - Stroke  - DVT/VTE  - Cardiac arrhythmia  - Respiratory Failure/COPD  - Anemia  - Advanced Liver disease

## 2020-01-08 ENCOUNTER — Other Ambulatory Visit: Payer: Self-pay

## 2020-01-08 NOTE — Telephone Encounter (Signed)
Is It ok to double book for pt's clarence this week? All providers are booked and pt's surgery is 9/28

## 2020-01-08 NOTE — Telephone Encounter (Signed)
Pt added to 9/21 @ 11:20

## 2020-01-09 ENCOUNTER — Ambulatory Visit (INDEPENDENT_AMBULATORY_CARE_PROVIDER_SITE_OTHER): Payer: Medicare Other | Admitting: Internal Medicine

## 2020-01-09 ENCOUNTER — Other Ambulatory Visit: Payer: Self-pay

## 2020-01-09 ENCOUNTER — Encounter: Payer: Self-pay | Admitting: Internal Medicine

## 2020-01-09 VITALS — BP 126/88 | HR 65 | Temp 98.2°F | Ht 65.5 in | Wt 195.0 lb

## 2020-01-09 DIAGNOSIS — Z01818 Encounter for other preprocedural examination: Secondary | ICD-10-CM

## 2020-01-09 DIAGNOSIS — Z23 Encounter for immunization: Secondary | ICD-10-CM

## 2020-01-09 DIAGNOSIS — E538 Deficiency of other specified B group vitamins: Secondary | ICD-10-CM | POA: Diagnosis not present

## 2020-01-09 DIAGNOSIS — M5440 Lumbago with sciatica, unspecified side: Secondary | ICD-10-CM

## 2020-01-09 DIAGNOSIS — G8929 Other chronic pain: Secondary | ICD-10-CM

## 2020-01-09 MED ORDER — CYCLOBENZAPRINE HCL 5 MG PO TABS
ORAL_TABLET | ORAL | 3 refills | Status: DC
Start: 2020-01-09 — End: 2020-01-17

## 2020-01-09 MED ORDER — PHENTERMINE HCL 37.5 MG PO TABS
37.5000 mg | ORAL_TABLET | Freq: Every day | ORAL | 2 refills | Status: DC
Start: 2020-01-09 — End: 2020-07-10

## 2020-01-09 MED ORDER — CYCLOBENZAPRINE HCL 5 MG PO TABS
ORAL_TABLET | ORAL | 3 refills | Status: DC
Start: 2020-01-09 — End: 2020-01-09

## 2020-01-09 NOTE — Assessment & Plan Note (Signed)
Mrs. Emily Sanchez is medically clear for her upcoming surgery.  She had lab work and an EKG at [facility].  Mrs. Emily Sanchez daughter is going to stay with her for a couple days after surgery.  Her husband is suffering with dementia and will be of no help. Thanks,

## 2020-01-09 NOTE — Progress Notes (Signed)
Subjective:  Patient ID: Emily Sanchez, female    DOB: 1955/12/15  Age: 64 y.o. MRN: [id number]  CC: Surgery clearance (Surgical clearance for total knee replacement on 01/16/20)   HPI [name] presents for a pre-op exam  IM consultation  Req by Dr Alvan Dame  Reason TKR R surgery medical clearance  Hx: Follow-up on chronic renal insufficiency, hypertension (off meds after weight loss), gout without recent relapse-all stable.  Her dog Alfonse Spruce died 12-17-19 - the patient has been grieving.  [name] daughter is going to stay with her for a couple days after surgery.  Her husband is suffering with dementia and will be of no help.  Past Medical History:  Diagnosis Date  . Anemia   . Anxiety   . Chronic kidney disease    stage 3 -  Kidney   . Crohn's disease ([place])   . Depression   . Enteritis due to Clostridium difficile 08/16/2012  . 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 GERD (gastroesophageal reflux disease)   . HTN (hypertension)    lst blood pressure medication was 3 months ago   . Ileostomy in place [facility])   . Low back pain    FMS  . Osteoarthritis    Dr Alvan Dame; both knees  . Perianal pain    Chronic, Post-Op  . Pneumonia    as a child   . PONV (postoperative nausea and vomiting)   . Transfusion history    none recent  . Ulcerative colitis ([place])    Dr Sharlett Iles   Past Surgical History:  Procedure Laterality Date  . BREATH TEK H PYLORI N/A 12/05/2013   Procedure: BREATH TEK H PYLORI;  Surgeon: Pedro Earls, MD;  Location: [facility];  Service: General;  Laterality: N/A;  . COLECTOMY  2001   ulcerative colitis  . ILEOSTOMY    . LAPAROSCOPIC GASTRIC SLEEVE RESECTION WITH HIATAL HERNIA REPAIR N/A 04/02/2015   Procedure: LAPAROSCOPIC LYSIS OF ADHESIONS GASTRIC SLEEVE RESECTION WITH OPEN VENTRAL HERNIA REPAIR;  Surgeon: Johnathan Hausen, MD;  Location: [facility];  Service: General;  Laterality: N/A;  . LAPAROSCOPIC LYSIS OF ADHESIONS   04/02/2015   Procedure: LAPAROSCOPIC LYSIS OF ADHESIONS;  Surgeon: Johnathan Hausen, MD;  Location: [facility];  Service: General;;  . LAPAROTOMY N/A 06/11/2017   Procedure: EXPLORATORY LAPAROTOMY, LYSIS OF ADHESIONS, REPAIR OF PEISTOMAL HERNIA;  Surgeon: Leighton Ruff, MD;  Location: [facility];  Service: General;  Laterality: N/A;  . TONSILLECTOMY    . TUBAL LIGATION    . UPPER GI ENDOSCOPY  04/02/2015   Procedure: UPPER GI ENDOSCOPY;  Surgeon: Johnathan Hausen, MD;  Location: [facility];  Service: General;;  . VENTRAL HERNIA REPAIR  04/02/2015   Procedure: HERNIA REPAIR VENTRAL ADULT;  Surgeon: Johnathan Hausen, MD;  Location: [facility];  Service: General;;    reports that she has quit smoking. She has never used smokeless tobacco. She reports that she does not drink alcohol and does not use drugs. family history includes Depression in her sister; Heart disease (age of onset: 25) in her father; Hypertension in an other family member. Allergies  Allergen Reactions  . Amphetamine-Dextroamphet Er Nausea And Vomiting  . Cymbalta [Duloxetine Hcl] Other (See Comments)    "Made me sleepy."  . Erythromycin Ethylsuccinate Nausea And Vomiting  . Morphine Nausea And Vomiting     Outpatient Medications Prior to Visit  Medication Sig Dispense Refill  . acetaminophen (TYLENOL) 500 MG tablet Take 500-1,000 mg by mouth every 6 (  six) hours as needed (for pain.).    Marland Kitchen allopurinol (ZYLOPRIM) 100 MG tablet TAKE 1 TABLET BY MOUTH EVERY DAY 90 tablet 1  . b complex vitamins tablet Take 1 tablet by mouth daily. 100 tablet 3  . Cholecalciferol (VITAMIN D3) 1.25 MG (50000 UT) CAPS Take 1 capsule by mouth once a week. 8 capsule 0  . Cholecalciferol (VITAMIN D3) 50 MCG (2000 UT) capsule Take 1 capsule (2,000 Units total) by mouth daily. 100 capsule 3  . colchicine 0.6 MG tablet TAKE 1 TABLET BY MOUTH EVERY DAY 90 tablet 1  . cyanocobalamin (,VITAMIN B-12,) 1000 MCG/ML injection Inject 1 mL (1,000 mcg total) into the muscle every  14 (fourteen) days. 20 mL 3  . cyclobenzaprine (FLEXERIL) 5 MG tablet TAKE 1 TABLET BY MOUTH EVERYDAY AT BEDTIME 30 tablet 3  . Lidocaine (ASPERCREME MAX STRENGTH EX) Apply 1 application topically 3 (three) times daily as needed (pain.).    Marland Kitchen pantoprazole (PROTONIX) 40 MG tablet TAKE 1 TABLET BY MOUTH EVERY DAY (Patient taking differently: Take 40 mg by mouth daily as needed (upset stomach/indigestion.). ) 90 tablet 3  . phentermine (ADIPEX-P) 37.5 MG tablet TAKE 1 TABLET BY MOUTH DAILY BEFORE BREAKFAST 30 tablet 2  . SYRINGE-NEEDLE, DISP, 3 ML (BD INTEGRA SYRINGE) 25G X 1" 3 ML MISC TO BE USED FOR B12 INJECTIONS 50 each 3  . traMADol (ULTRAM) 50 MG tablet Take 1 tablet (50 mg total) by mouth every 6 (six) hours as needed for severe pain. 120 tablet 3  . promethazine (PHENERGAN) 12.5 MG tablet Take 1 tablet (12.5 mg total) by mouth every 6 (six) hours as needed for up to 7 days for nausea or vomiting. (Patient not taking: Reported on 12/29/2019) 20 tablet 1   No facility-administered medications prior to visit.    ROS: Review of Systems  Constitutional: Negative for activity change, appetite change, chills, fatigue and unexpected weight change.  HENT: Negative for congestion, mouth sores and sinus pressure.   Eyes: Negative for visual disturbance.  Respiratory: Negative for cough and chest tightness.   Gastrointestinal: Negative for abdominal pain and nausea.  Genitourinary: Negative for difficulty urinating, frequency and vaginal pain.  Musculoskeletal: Positive for arthralgias and gait problem. Negative for back pain.  Skin: Negative for pallor and rash.  Neurological: Negative for dizziness, tremors, weakness, numbness and headaches.  Psychiatric/Behavioral: Positive for dysphoric mood. Negative for confusion, sleep disturbance and suicidal ideas.  Prince died - sad  Objective:  BP 126/88 (BP Location: Left Arm, Patient Position: Sitting, Cuff Size: Large)   Pulse 65   Temp 98.2 F  (36.8 C) (Oral)   Ht 5' 5.5" (1.664 m)   Wt 195 lb (88.5 kg)   SpO2 99%   BMI 31.96 kg/m   BP Readings from Last 3 Encounters:  01/09/20 126/88  01/04/20 (!) 149/95  08/09/19 130/78    Wt Readings from Last 3 Encounters:  01/09/20 195 lb (88.5 kg)  01/04/20 196 lb (88.9 kg)  08/09/19 193 lb (87.5 kg)    Physical Exam Constitutional:      General: She is not in acute distress.    Appearance: She is well-developed. She is obese.  HENT:     Head: Normocephalic.     Right Ear: External ear normal.     Left Ear: External ear normal.     Nose: Nose normal.  Eyes:     General:        Right eye: No discharge.  Left eye: No discharge.     Conjunctiva/sclera: Conjunctivae normal.     Pupils: Pupils are equal, round, and reactive to light.  Neck:     Thyroid: No thyromegaly.     Vascular: No JVD.     Trachea: No tracheal deviation.  Cardiovascular:     Rate and Rhythm: Normal rate and regular rhythm.     Heart sounds: Normal heart sounds.  Pulmonary:     Effort: No respiratory distress.     Breath sounds: No stridor. No wheezing.  Abdominal:     General: Bowel sounds are normal. There is no distension.     Palpations: Abdomen is soft. There is no mass.     Tenderness: There is no abdominal tenderness. There is no guarding or rebound.  Musculoskeletal:        General: Tenderness present.     Cervical back: Normal range of motion and neck supple.  Lymphadenopathy:     Cervical: No cervical adenopathy.  Skin:    Findings: No erythema or rash.  Neurological:     Cranial Nerves: No cranial nerve deficit.     Motor: No abnormal muscle tone.     Coordination: Coordination normal.     Gait: Gait abnormal.     Deep Tendon Reflexes: Reflexes normal.  Psychiatric:        Behavior: Behavior normal.        Thought Content: Thought content normal.        Judgment: Judgment normal.    R knee w/pain   Lab Results  Component Value Date   WBC 7.3 01/04/2020   HGB  12.0 01/04/2020   HCT 37.4 01/04/2020   PLT 280 01/04/2020   GLUCOSE 96 01/04/2020   CHOL 172 05/27/2010   TRIG 100.0 05/27/2010   HDL 46.30 05/27/2010   LDLCALC 106 (H) 05/27/2010   ALT 7 08/09/2019   AST 14 08/09/2019   NA 139 01/04/2020   K 4.0 01/04/2020   CL 105 01/04/2020   CREATININE 1.22 (H) 01/04/2020   BUN 35 (H) 01/04/2020   CO2 24 01/04/2020   TSH 0.78 08/09/2019   INR 1.0 01/05/2018   HGBA1C 5.9 12/17/2009   MICROALBUR 0.6 02/10/2013    No results found.  Assessment & Plan:   Walker Kehr, MD

## 2020-01-09 NOTE — Assessment & Plan Note (Signed)
Chronic pain.  Tramadol as needed.  Potential benefits of a long term opioids use as well as potential risks (i.e. addiction risk, apnea etc) and complications (i.e. Somnolence, constipation and others) were explained to the patient and were aknowledged.

## 2020-01-09 NOTE — Assessment & Plan Note (Signed)
On vitamin B12.

## 2020-01-09 NOTE — Assessment & Plan Note (Signed)
Phentermine for appetite control.  She will not take it 1 week prior to surgery and 1 week following the surgery.

## 2020-01-12 ENCOUNTER — Other Ambulatory Visit (HOSPITAL_COMMUNITY)
Admission: RE | Admit: 2020-01-12 | Discharge: 2020-01-12 | Disposition: A | Discharge status: Home / Self Care | Payer: Medicare Other | Source: Ambulatory Visit | Attending: Orthopedic Surgery | Admitting: Orthopedic Surgery

## 2020-01-12 DIAGNOSIS — Z20822 Contact with and (suspected) exposure to covid-19: Secondary | ICD-10-CM | POA: Insufficient documentation

## 2020-01-12 DIAGNOSIS — Z01812 Encounter for preprocedural laboratory examination: Secondary | ICD-10-CM | POA: Insufficient documentation

## 2020-01-12 LAB — SARS CORONAVIRUS 2 (TAT 6-24 HRS): SARS Coronavirus 2: NEGATIVE

## 2020-01-15 ENCOUNTER — Encounter (HOSPITAL_COMMUNITY): Payer: Self-pay | Admitting: Orthopedic Surgery

## 2020-01-15 NOTE — Anesthesia Preprocedure Evaluation (Addendum)
Anesthesia Evaluation  Patient identified by MRN, date of birth, ID band Patient awake    Reviewed: Allergy & Precautions, NPO status , [name] Chart, lab work & pertinent test results  History of Anesthesia Complications (+) PONV  Airway Mallampati: II  TM Distance: >3 FB Neck ROM: Full    Dental  (+) Teeth Intact, Dental Advisory Given   Pulmonary former smoker,    breath sounds clear to auscultation       Cardiovascular hypertension,  Rhythm:Regular Rate:Normal     Neuro/Psych PSYCHIATRIC DISORDERS Anxiety Depression    GI/Hepatic Neg liver ROS, PUD, GERD  ,  Endo/Other  negative endocrine ROS  Renal/GU Renal InsufficiencyRenal disease     Musculoskeletal  (+) Arthritis ,   Abdominal Normal abdominal exam  (+)   Peds  Hematology   Anesthesia Other Findings   Reproductive/Obstetrics                            Anesthesia Physical Anesthesia Plan  ASA: II  Anesthesia Plan: Spinal   Post-op Pain Management:  Regional for Post-op pain   Induction: Intravenous  PONV Risk Score and Plan: 3 and Ondansetron, Propofol infusion, Midazolam and Dexamethasone  Airway Management Planned: Natural Airway and Simple Face Mask  Additional Equipment: None  Intra-op Plan:   Post-operative Plan:   Informed Consent: I have reviewed the patients History and Physical, chart, labs and discussed the procedure including the risks, benefits and alternatives for the proposed anesthesia with the patient or authorized representative who has indicated his/her understanding and acceptance.       Plan Discussed with: CRNA  Anesthesia Plan Comments: (Lab Results      Component                Value               Date                      WBC                      7.3                 01/04/2020                HGB                      12.0                01/04/2020                HCT                       37.4                01/04/2020                MCV                      101.6 (H)           01/04/2020                PLT                      280  01/04/2020            Lab Results      Component                Value               Date                      CREATININE               1.22 (H)            01/04/2020                BUN                      35 (H)              01/04/2020                NA                       139                 01/04/2020                K                        4.0                 01/04/2020                CL                       105                 01/04/2020                CO2                      24                  01/04/2020            Lab Results      Component                Value               Date                      INR                      1.0                 01/05/2018                INR                      0.96                06/09/2017                INR                      1.20                04/04/2015           )  Anesthesia Quick Evaluation

## 2020-01-16 ENCOUNTER — Other Ambulatory Visit: Payer: Self-pay

## 2020-01-16 ENCOUNTER — Observation Stay (HOSPITAL_COMMUNITY)
Admission: RE | Admit: 2020-01-16 | Discharge: 2020-01-17 | Disposition: A | Discharge status: Home / Self Care | Payer: Medicare Other | Attending: Orthopedic Surgery | Admitting: Orthopedic Surgery

## 2020-01-16 ENCOUNTER — Encounter (HOSPITAL_COMMUNITY): Payer: Self-pay | Admitting: Orthopedic Surgery

## 2020-01-16 ENCOUNTER — Encounter (HOSPITAL_COMMUNITY)
Admission: RE | Disposition: A | Discharge status: Home / Self Care | Payer: Self-pay | Source: Home / Self Care | Attending: Orthopedic Surgery

## 2020-01-16 ENCOUNTER — Ambulatory Visit (HOSPITAL_COMMUNITY): Payer: Medicare Other | Admitting: Anesthesiology

## 2020-01-16 ENCOUNTER — Ambulatory Visit (HOSPITAL_COMMUNITY): Payer: Medicare Other | Admitting: Physician Assistant

## 2020-01-16 DIAGNOSIS — M797 Fibromyalgia: Secondary | ICD-10-CM | POA: Insufficient documentation

## 2020-01-16 DIAGNOSIS — Z96651 Presence of right artificial knee joint: Secondary | ICD-10-CM

## 2020-01-16 DIAGNOSIS — N183 Chronic kidney disease, stage 3 unspecified: Secondary | ICD-10-CM | POA: Diagnosis not present

## 2020-01-16 DIAGNOSIS — M1712 Unilateral primary osteoarthritis, left knee: Secondary | ICD-10-CM | POA: Diagnosis not present

## 2020-01-16 DIAGNOSIS — E669 Obesity, unspecified: Secondary | ICD-10-CM | POA: Diagnosis present

## 2020-01-16 DIAGNOSIS — M109 Gout, unspecified: Secondary | ICD-10-CM | POA: Diagnosis not present

## 2020-01-16 DIAGNOSIS — Z96659 Presence of unspecified artificial knee joint: Secondary | ICD-10-CM

## 2020-01-16 DIAGNOSIS — Z87891 Personal history of nicotine dependence: Secondary | ICD-10-CM | POA: Diagnosis not present

## 2020-01-16 DIAGNOSIS — M1711 Unilateral primary osteoarthritis, right knee: Secondary | ICD-10-CM | POA: Diagnosis not present

## 2020-01-16 DIAGNOSIS — I129 Hypertensive chronic kidney disease with stage 1 through stage 4 chronic kidney disease, or unspecified chronic kidney disease: Secondary | ICD-10-CM | POA: Diagnosis not present

## 2020-01-16 DIAGNOSIS — Z79899 Other long term (current) drug therapy: Secondary | ICD-10-CM | POA: Diagnosis not present

## 2020-01-16 HISTORY — PX: INJECTION KNEE: SHX2446

## 2020-01-16 HISTORY — PX: TOTAL KNEE ARTHROPLASTY: SHX125

## 2020-01-16 SURGERY — ARTHROPLASTY, KNEE, TOTAL
Anesthesia: Spinal | Site: Knee | Laterality: Right

## 2020-01-16 MED ORDER — HYDROCODONE-ACETAMINOPHEN 7.5-325 MG PO TABS
ORAL_TABLET | ORAL | Status: AC
  Filled 2020-01-16: qty 1

## 2020-01-16 MED ORDER — ACETAMINOPHEN 325 MG PO TABS: 325.0000 mg | ORAL_TABLET | Freq: Once | ORAL | Status: DC | PRN

## 2020-01-16 MED ORDER — ONDANSETRON HCL 4 MG PO TABS: 4.0000 mg | ORAL_TABLET | Freq: Three times a day (TID) | ORAL | 1 refills | Status: DC | PRN

## 2020-01-16 MED ORDER — METHOCARBAMOL 500 MG PO TABS
500.0000 mg | ORAL_TABLET | Freq: Four times a day (QID) | ORAL | Status: DC | PRN
  Administered 2020-01-17: 500 mg via ORAL
  Filled 2020-01-16: qty 1

## 2020-01-16 MED ORDER — HYDROCODONE-ACETAMINOPHEN 7.5-325 MG PO TABS
1.0000 | ORAL_TABLET | ORAL | Status: DC | PRN
  Administered 2020-01-16 (×3): 1 via ORAL

## 2020-01-16 MED ORDER — KETOROLAC TROMETHAMINE 15 MG/ML IJ SOLN
INTRAMUSCULAR | Status: AC
  Administered 2020-01-16: 15 mg
  Filled 2020-01-16: qty 1

## 2020-01-16 MED ORDER — METHYLPREDNISOLONE ACETATE 40 MG/ML IJ SUSP
INTRAMUSCULAR | Status: AC
  Filled 2020-01-16: qty 1

## 2020-01-16 MED ORDER — PROPOFOL 500 MG/50ML IV EMUL
INTRAVENOUS | Status: AC
  Filled 2020-01-16: qty 50

## 2020-01-16 MED ORDER — ORAL CARE MOUTH RINSE: 15.0000 mL | Freq: Once | OROMUCOSAL | Status: AC

## 2020-01-16 MED ORDER — PHENYLEPHRINE HCL (PRESSORS) 10 MG/ML IV SOLN
INTRAVENOUS | Status: AC
  Filled 2020-01-16: qty 1

## 2020-01-16 MED ORDER — HYDROCODONE-ACETAMINOPHEN 7.5-325 MG PO TABS
ORAL_TABLET | ORAL | Status: AC
Start: 2020-01-16 — End: 2020-01-17
  Filled 2020-01-16: qty 1

## 2020-01-16 MED ORDER — BUPIVACAINE HCL 0.25 % IJ SOLN
INTRAMUSCULAR | Status: DC | PRN
Start: 2020-01-16 — End: 2020-01-16
  Administered 2020-01-16: 6 mL

## 2020-01-16 MED ORDER — MIDAZOLAM HCL 2 MG/2ML IJ SOLN
INTRAMUSCULAR | Status: AC
  Filled 2020-01-16: qty 2

## 2020-01-16 MED ORDER — FERROUS SULFATE 325 (65 FE) MG PO TABS: 325.0000 mg | ORAL_TABLET | Freq: Three times a day (TID) | ORAL | 0 refills | Status: DC

## 2020-01-16 MED ORDER — PROPOFOL 1000 MG/100ML IV EMUL
INTRAVENOUS | Status: AC
  Filled 2020-01-16: qty 100

## 2020-01-16 MED ORDER — PANTOPRAZOLE SODIUM 40 MG PO TBEC
40.0000 mg | DELAYED_RELEASE_TABLET | Freq: Every day | ORAL | Status: DC
  Administered 2020-01-17: 40 mg via ORAL
  Filled 2020-01-16: qty 1

## 2020-01-16 MED ORDER — POLYETHYLENE GLYCOL 3350 17 G PO PACK
17.0000 g | PACK | Freq: Two times a day (BID) | ORAL | Status: DC
  Administered 2020-01-16 – 2020-01-17 (×2): 17 g via ORAL
  Filled 2020-01-16 (×2): qty 1

## 2020-01-16 MED ORDER — TRANEXAMIC ACID-NACL 1000-0.7 MG/100ML-% IV SOLN
INTRAVENOUS | Status: AC
  Filled 2020-01-16: qty 100

## 2020-01-16 MED ORDER — DOCUSATE SODIUM 100 MG PO CAPS
100.0000 mg | ORAL_CAPSULE | Freq: Two times a day (BID) | ORAL | Status: DC
  Administered 2020-01-16 – 2020-01-17 (×2): 100 mg via ORAL
  Filled 2020-01-16 (×2): qty 1

## 2020-01-16 MED ORDER — BUPIVACAINE-EPINEPHRINE (PF) 0.25% -1:200000 IJ SOLN
INTRAMUSCULAR | Status: DC | PRN
  Administered 2020-01-16: 30 mL via PERINEURAL

## 2020-01-16 MED ORDER — KETOROLAC TROMETHAMINE 15 MG/ML IJ SOLN
15.0000 mg | Freq: Four times a day (QID) | INTRAMUSCULAR | Status: DC | PRN
  Administered 2020-01-17: 15 mg via INTRAVENOUS
  Filled 2020-01-16 (×2): qty 1

## 2020-01-16 MED ORDER — ASPIRIN 81 MG PO CHEW
81.0000 mg | CHEWABLE_TABLET | Freq: Two times a day (BID) | ORAL | Status: DC
  Administered 2020-01-16 – 2020-01-17 (×2): 81 mg via ORAL
  Filled 2020-01-16 (×2): qty 1

## 2020-01-16 MED ORDER — METHYLPREDNISOLONE ACETATE 80 MG/ML IJ SUSP
INTRAMUSCULAR | Status: DC | PRN
  Administered 2020-01-16: 80 mg

## 2020-01-16 MED ORDER — SODIUM CHLORIDE (PF) 0.9 % IJ SOLN
INTRAMUSCULAR | Status: DC | PRN
  Administered 2020-01-16: 30 mL

## 2020-01-16 MED ORDER — CEFAZOLIN SODIUM-DEXTROSE 2-4 GM/100ML-% IV SOLN
2.0000 g | Freq: Four times a day (QID) | INTRAVENOUS | Status: AC
  Administered 2020-01-16 (×2): 2 g via INTRAVENOUS
  Filled 2020-01-16: qty 100

## 2020-01-16 MED ORDER — ALUM & MAG HYDROXIDE-SIMETH 200-200-20 MG/5ML PO SUSP: 15.0000 mL | ORAL | Status: DC | PRN

## 2020-01-16 MED ORDER — MIDAZOLAM HCL 5 MG/5ML IJ SOLN
INTRAMUSCULAR | Status: DC | PRN
  Administered 2020-01-16: 2 mg via INTRAVENOUS

## 2020-01-16 MED ORDER — ONDANSETRON HCL 4 MG/2ML IJ SOLN
INTRAMUSCULAR | Status: AC
  Filled 2020-01-16: qty 2

## 2020-01-16 MED ORDER — ONDANSETRON 4 MG PO TBDP
ORAL_TABLET | ORAL | Status: AC
  Administered 2020-01-16: 4 mg
  Filled 2020-01-16: qty 1

## 2020-01-16 MED ORDER — CEFAZOLIN SODIUM-DEXTROSE 2-4 GM/100ML-% IV SOLN
INTRAVENOUS | Status: AC
  Filled 2020-01-16: qty 100

## 2020-01-16 MED ORDER — AMISULPRIDE (ANTIEMETIC) 5 MG/2ML IV SOLN
INTRAVENOUS | Status: AC
  Filled 2020-01-16: qty 4

## 2020-01-16 MED ORDER — PHENYLEPHRINE HCL-NACL 10-0.9 MG/250ML-% IV SOLN
INTRAVENOUS | Status: DC | PRN
  Administered 2020-01-16: 40 ug/min via INTRAVENOUS

## 2020-01-16 MED ORDER — LACTATED RINGERS IV SOLN: INTRAVENOUS | Status: DC | PRN

## 2020-01-16 MED ORDER — DOCUSATE SODIUM 100 MG PO CAPS: 100.0000 mg | ORAL_CAPSULE | Freq: Two times a day (BID) | ORAL | 0 refills | Status: DC

## 2020-01-16 MED ORDER — ONDANSETRON HCL 4 MG PO TABS
4.0000 mg | ORAL_TABLET | Freq: Four times a day (QID) | ORAL | Status: DC | PRN
  Filled 2020-01-16: qty 1

## 2020-01-16 MED ORDER — HYDROMORPHONE HCL 1 MG/ML IJ SOLN
INTRAMUSCULAR | Status: AC
Start: 2020-01-16 — End: 2020-01-16
  Filled 2020-01-16: qty 1

## 2020-01-16 MED ORDER — KETOROLAC TROMETHAMINE 30 MG/ML IJ SOLN
INTRAMUSCULAR | Status: AC
  Filled 2020-01-16: qty 1

## 2020-01-16 MED ORDER — SODIUM CHLORIDE (PF) 0.9 % IJ SOLN
INTRAMUSCULAR | Status: AC
  Filled 2020-01-16: qty 50

## 2020-01-16 MED ORDER — TRANEXAMIC ACID-NACL 1000-0.7 MG/100ML-% IV SOLN
1000.0000 mg | INTRAVENOUS | Status: AC
  Administered 2020-01-16: 1000 mg via INTRAVENOUS
  Filled 2020-01-16: qty 100

## 2020-01-16 MED ORDER — DEXAMETHASONE SODIUM PHOSPHATE 10 MG/ML IJ SOLN
10.0000 mg | Freq: Once | INTRAMUSCULAR | Status: AC
  Administered 2020-01-17: 10 mg via INTRAVENOUS
  Filled 2020-01-16: qty 1

## 2020-01-16 MED ORDER — ONDANSETRON HCL 4 MG/2ML IJ SOLN
INTRAMUSCULAR | Status: DC | PRN
  Administered 2020-01-16: 4 mg via INTRAVENOUS

## 2020-01-16 MED ORDER — DIPHENHYDRAMINE HCL 12.5 MG/5ML PO ELIX: 12.5000 mg | ORAL_SOLUTION | ORAL | Status: DC | PRN

## 2020-01-16 MED ORDER — OXYCODONE HCL 5 MG PO TABS
10.0000 mg | ORAL_TABLET | ORAL | Status: DC | PRN
  Administered 2020-01-17 (×4): 15 mg via ORAL
  Filled 2020-01-16: qty 2
  Filled 2020-01-16 (×4): qty 3

## 2020-01-16 MED ORDER — LIDOCAINE 2% (20 MG/ML) 5 ML SYRINGE
INTRAMUSCULAR | Status: AC
  Filled 2020-01-16: qty 5

## 2020-01-16 MED ORDER — DEXAMETHASONE SODIUM PHOSPHATE 10 MG/ML IJ SOLN: 10.0000 mg | Freq: Once | INTRAMUSCULAR | Status: DC

## 2020-01-16 MED ORDER — PROPOFOL 10 MG/ML IV BOLUS
INTRAVENOUS | Status: AC
  Filled 2020-01-16: qty 20

## 2020-01-16 MED ORDER — HYDROMORPHONE HCL 1 MG/ML IJ SOLN
0.5000 mg | INTRAMUSCULAR | Status: DC | PRN
  Administered 2020-01-16: 1 mg via INTRAVENOUS
  Filled 2020-01-16: qty 1

## 2020-01-16 MED ORDER — METHOCARBAMOL 500 MG IVPB - SIMPLE MED
INTRAVENOUS | Status: AC
  Filled 2020-01-16: qty 50

## 2020-01-16 MED ORDER — FENTANYL CITRATE (PF) 100 MCG/2ML IJ SOLN
INTRAMUSCULAR | Status: DC | PRN
Start: 2020-01-16 — End: 2020-01-16
  Administered 2020-01-16: 100 ug via INTRAVENOUS

## 2020-01-16 MED ORDER — PROPOFOL 10 MG/ML IV BOLUS
INTRAVENOUS | Status: DC | PRN
  Administered 2020-01-16: 20 mg via INTRAVENOUS
  Administered 2020-01-16: 50 mg via INTRAVENOUS
  Administered 2020-01-16: 30 mg via INTRAVENOUS
  Administered 2020-01-16: 50 mg via INTRAVENOUS
  Administered 2020-01-16: 30 mg via INTRAVENOUS
  Administered 2020-01-16: 50 mg via INTRAVENOUS
  Administered 2020-01-16: 20 mg via INTRAVENOUS
  Administered 2020-01-16: 50 mg via INTRAVENOUS
  Administered 2020-01-16: 20 mg via INTRAVENOUS
  Administered 2020-01-16 (×2): 30 mg via INTRAVENOUS
  Administered 2020-01-16: 20 mg via INTRAVENOUS

## 2020-01-16 MED ORDER — ASPIRIN 81 MG PO CHEW: 81.0000 mg | CHEWABLE_TABLET | Freq: Two times a day (BID) | ORAL | 0 refills | Status: AC

## 2020-01-16 MED ORDER — BISACODYL 10 MG RE SUPP: 10.0000 mg | Freq: Every day | RECTAL | Status: DC | PRN

## 2020-01-16 MED ORDER — HYDROCODONE-ACETAMINOPHEN 7.5-325 MG PO TABS
1.0000 | ORAL_TABLET | ORAL | 0 refills | Status: DC | PRN
Start: 2020-01-16 — End: 2020-01-17

## 2020-01-16 MED ORDER — METHOCARBAMOL 500 MG PO TABS: 500.0000 mg | ORAL_TABLET | Freq: Four times a day (QID) | ORAL | 0 refills | Status: DC | PRN

## 2020-01-16 MED ORDER — DEXAMETHASONE SODIUM PHOSPHATE 10 MG/ML IJ SOLN
INTRAMUSCULAR | Status: DC | PRN
  Administered 2020-01-16: 10 mg via INTRAVENOUS

## 2020-01-16 MED ORDER — ACETAMINOPHEN 325 MG PO TABS: 325.0000 mg | ORAL_TABLET | Freq: Four times a day (QID) | ORAL | Status: DC | PRN

## 2020-01-16 MED ORDER — METOCLOPRAMIDE HCL 5 MG/ML IJ SOLN: 5.0000 mg | Freq: Three times a day (TID) | INTRAMUSCULAR | Status: DC | PRN

## 2020-01-16 MED ORDER — BUPIVACAINE IN DEXTROSE 0.75-8.25 % IT SOLN
INTRATHECAL | Status: DC | PRN
  Administered 2020-01-16: 1.6 mL via INTRATHECAL

## 2020-01-16 MED ORDER — AMISULPRIDE (ANTIEMETIC) 5 MG/2ML IV SOLN
10.0000 mg | Freq: Once | INTRAVENOUS | Status: AC | PRN
  Administered 2020-01-16: 10 mg via INTRAVENOUS

## 2020-01-16 MED ORDER — CEFAZOLIN SODIUM-DEXTROSE 2-4 GM/100ML-% IV SOLN
2.0000 g | INTRAVENOUS | Status: AC
  Administered 2020-01-16: 2 g via INTRAVENOUS
  Filled 2020-01-16: qty 100

## 2020-01-16 MED ORDER — ROPIVACAINE HCL 7.5 MG/ML IJ SOLN
INTRAMUSCULAR | Status: DC | PRN
  Administered 2020-01-16: 20 mL via PERINEURAL

## 2020-01-16 MED ORDER — FERROUS SULFATE 325 (65 FE) MG PO TABS
325.0000 mg | ORAL_TABLET | Freq: Two times a day (BID) | ORAL | Status: DC
  Administered 2020-01-17: 325 mg via ORAL
  Filled 2020-01-16: qty 1

## 2020-01-16 MED ORDER — LACTATED RINGERS IV BOLUS: 250.0000 mL | Freq: Once | INTRAVENOUS | Status: DC

## 2020-01-16 MED ORDER — POLYETHYLENE GLYCOL 3350 17 G PO PACK: 17.0000 g | PACK | Freq: Two times a day (BID) | ORAL | 0 refills | Status: DC

## 2020-01-16 MED ORDER — SODIUM CHLORIDE 0.9 % IV SOLN: INTRAVENOUS | Status: DC

## 2020-01-16 MED ORDER — METHOCARBAMOL 500 MG IVPB - SIMPLE MED
500.0000 mg | Freq: Four times a day (QID) | INTRAVENOUS | Status: DC | PRN
  Administered 2020-01-16: 500 mg via INTRAVENOUS
  Filled 2020-01-16: qty 50

## 2020-01-16 MED ORDER — METOCLOPRAMIDE HCL 5 MG PO TABS: 5.0000 mg | ORAL_TABLET | Freq: Three times a day (TID) | ORAL | Status: DC | PRN

## 2020-01-16 MED ORDER — ONDANSETRON HCL 4 MG/2ML IJ SOLN: 4.0000 mg | Freq: Four times a day (QID) | INTRAMUSCULAR | Status: DC | PRN

## 2020-01-16 MED ORDER — ACETAMINOPHEN 160 MG/5ML PO SOLN: 325.0000 mg | Freq: Once | ORAL | Status: DC | PRN

## 2020-01-16 MED ORDER — MEPERIDINE HCL 50 MG/ML IJ SOLN: 6.2500 mg | INTRAMUSCULAR | Status: DC | PRN

## 2020-01-16 MED ORDER — BUPIVACAINE HCL 0.25 % IJ SOLN
INTRAMUSCULAR | Status: AC
  Filled 2020-01-16: qty 1

## 2020-01-16 MED ORDER — MAGNESIUM CITRATE PO SOLN: 1.0000 | Freq: Once | ORAL | Status: DC | PRN

## 2020-01-16 MED ORDER — ACETAMINOPHEN 10 MG/ML IV SOLN
1000.0000 mg | Freq: Once | INTRAVENOUS | Status: DC | PRN
  Administered 2020-01-16: 1000 mg via INTRAVENOUS

## 2020-01-16 MED ORDER — LACTATED RINGERS IV SOLN: INTRAVENOUS | Status: DC

## 2020-01-16 MED ORDER — HYDROCODONE-ACETAMINOPHEN 5-325 MG PO TABS: 1.0000 | ORAL_TABLET | ORAL | Status: DC | PRN

## 2020-01-16 MED ORDER — LIDOCAINE HCL (CARDIAC) PF 100 MG/5ML IV SOSY
PREFILLED_SYRINGE | INTRAVENOUS | Status: DC | PRN
  Administered 2020-01-16: 80 mg via INTRAVENOUS

## 2020-01-16 MED ORDER — DEXAMETHASONE SODIUM PHOSPHATE 10 MG/ML IJ SOLN
INTRAMUSCULAR | Status: AC
  Filled 2020-01-16: qty 1

## 2020-01-16 MED ORDER — OXYCODONE HCL 5 MG PO TABS
5.0000 mg | ORAL_TABLET | ORAL | Status: DC | PRN
  Administered 2020-01-16: 5 mg via ORAL
  Filled 2020-01-16: qty 1

## 2020-01-16 MED ORDER — ACETAMINOPHEN 10 MG/ML IV SOLN
INTRAVENOUS | Status: AC
  Filled 2020-01-16: qty 100

## 2020-01-16 MED ORDER — PROPOFOL 500 MG/50ML IV EMUL
INTRAVENOUS | Status: DC | PRN
  Administered 2020-01-16: 100 ug/kg/min via INTRAVENOUS

## 2020-01-16 MED ORDER — MENTHOL 3 MG MT LOZG: 1.0000 | LOZENGE | OROMUCOSAL | Status: DC | PRN

## 2020-01-16 MED ORDER — OXYCODONE HCL 5 MG PO TABS
ORAL_TABLET | ORAL | Status: AC
Start: 2020-01-16 — End: 2020-01-17
  Filled 2020-01-16: qty 1

## 2020-01-16 MED ORDER — CYCLOBENZAPRINE HCL 5 MG PO TABS
5.0000 mg | ORAL_TABLET | Freq: Every day | ORAL | Status: DC
  Administered 2020-01-16: 5 mg via ORAL
  Filled 2020-01-16: qty 1

## 2020-01-16 MED ORDER — HYDROMORPHONE HCL 1 MG/ML IJ SOLN
0.2500 mg | INTRAMUSCULAR | Status: DC | PRN
  Administered 2020-01-16 (×4): 0.5 mg via INTRAVENOUS

## 2020-01-16 MED ORDER — PHENOL 1.4 % MT LIQD: 1.0000 | OROMUCOSAL | Status: DC | PRN

## 2020-01-16 MED ORDER — KETOROLAC TROMETHAMINE 30 MG/ML IJ SOLN
INTRAMUSCULAR | Status: DC | PRN
  Administered 2020-01-16: 30 mg

## 2020-01-16 MED ORDER — TRANEXAMIC ACID-NACL 1000-0.7 MG/100ML-% IV SOLN
1000.0000 mg | Freq: Once | INTRAVENOUS | Status: AC
  Administered 2020-01-16: 1000 mg via INTRAVENOUS

## 2020-01-16 MED ORDER — FENTANYL CITRATE (PF) 100 MCG/2ML IJ SOLN
INTRAMUSCULAR | Status: AC
  Filled 2020-01-16: qty 2

## 2020-01-16 MED ORDER — BUPIVACAINE-EPINEPHRINE (PF) 0.25% -1:200000 IJ SOLN
INTRAMUSCULAR | Status: AC
  Filled 2020-01-16: qty 30

## 2020-01-16 MED ORDER — POVIDONE-IODINE 10 % EX SWAB
2.0000 "application " | Freq: Once | CUTANEOUS | Status: AC
  Administered 2020-01-16: 2 via TOPICAL

## 2020-01-16 MED ORDER — CHLORHEXIDINE GLUCONATE 0.12 % MT SOLN
15.0000 mL | Freq: Once | OROMUCOSAL | Status: AC
  Administered 2020-01-16: 15 mL via OROMUCOSAL

## 2020-01-16 MED ORDER — PHENYLEPHRINE 40 MCG/ML (10ML) SYRINGE FOR IV PUSH (FOR BLOOD PRESSURE SUPPORT)
PREFILLED_SYRINGE | INTRAVENOUS | Status: DC | PRN
  Administered 2020-01-16 (×5): 80 ug via INTRAVENOUS

## 2020-01-16 MED ORDER — LACTATED RINGERS IV BOLUS
500.0000 mL | Freq: Once | INTRAVENOUS | Status: AC
  Administered 2020-01-16: 500 mL via INTRAVENOUS

## 2020-01-16 SURGICAL SUPPLY — 63 items
ATTUNE MED ANAT PAT 35 KNEE (Knees) ×3 IMPLANT
ATTUNE MED ANAT PAT 35MM KNEE (Knees) ×1 IMPLANT
ATTUNE PS FEM RT SZ 5 CEM KNEE (Femur) ×4 IMPLANT
BAG ZIPLOCK 12X15 (MISCELLANEOUS) IMPLANT
BASE TIBIA ATTUNE KNEE SYS SZ6 (Knees) ×2 IMPLANT
BLADE SAW SGTL 11.0X1.19X90.0M (BLADE) IMPLANT
BLADE SAW SGTL 13.0X1.19X90.0M (BLADE) ×4 IMPLANT
BLADE SURG SZ10 CARB STEEL (BLADE) ×8 IMPLANT
BNDG ELASTIC 6X10 VLCR STRL LF (GAUZE/BANDAGES/DRESSINGS) ×4 IMPLANT
BNDG ELASTIC 6X5.8 VLCR STR LF (GAUZE/BANDAGES/DRESSINGS) ×4 IMPLANT
BOWL SMART MIX CTS (DISPOSABLE) ×4 IMPLANT
CEMENT HV SMART SET (Cement) ×8 IMPLANT
COVER SURGICAL LIGHT HANDLE (MISCELLANEOUS) ×4 IMPLANT
COVER WAND RF STERILE (DRAPES) IMPLANT
CUFF TOURN SGL QUICK 34 (TOURNIQUET CUFF) ×4
CUFF TRNQT CYL 34X4.125X (TOURNIQUET CUFF) ×2 IMPLANT
DECANTER SPIKE VIAL GLASS SM (MISCELLANEOUS) ×8 IMPLANT
DERMABOND ADVANCED (GAUZE/BANDAGES/DRESSINGS) ×2
DERMABOND ADVANCED .7 DNX12 (GAUZE/BANDAGES/DRESSINGS) ×2 IMPLANT
DRAPE U-SHAPE 47X51 STRL (DRAPES) ×4 IMPLANT
DRESSING AQUACEL AG SP 3.5X10 (GAUZE/BANDAGES/DRESSINGS) ×2 IMPLANT
DRSG AQUACEL AG ADV 3.5X10 (GAUZE/BANDAGES/DRESSINGS) ×4 IMPLANT
DRSG AQUACEL AG SP 3.5X10 (GAUZE/BANDAGES/DRESSINGS) ×4
DURAPREP 26ML APPLICATOR (WOUND CARE) ×8 IMPLANT
ELECT REM PT RETURN 15FT ADLT (MISCELLANEOUS) ×4 IMPLANT
GLOVE BIO SURGEON STRL SZ 6 (GLOVE) ×4 IMPLANT
GLOVE BIOGEL PI IND STRL 6.5 (GLOVE) ×2 IMPLANT
GLOVE BIOGEL PI IND STRL 7.5 (GLOVE) ×2 IMPLANT
GLOVE BIOGEL PI IND STRL 8.5 (GLOVE) ×2 IMPLANT
GLOVE BIOGEL PI INDICATOR 6.5 (GLOVE) ×2
GLOVE BIOGEL PI INDICATOR 7.5 (GLOVE) ×2
GLOVE BIOGEL PI INDICATOR 8.5 (GLOVE) ×2
GLOVE ECLIPSE 8.0 STRL XLNG CF (GLOVE) ×4 IMPLANT
GLOVE ORTHO TXT STRL SZ7.5 (GLOVE) ×4 IMPLANT
GOWN STRL REUS W/ TWL LRG LVL3 (GOWN DISPOSABLE) ×2 IMPLANT
GOWN STRL REUS W/TWL 2XL LVL3 (GOWN DISPOSABLE) ×4 IMPLANT
GOWN STRL REUS W/TWL LRG LVL3 (GOWN DISPOSABLE) ×8 IMPLANT
HANDPIECE INTERPULSE COAX TIP (DISPOSABLE) ×4
HOLDER FOLEY CATH W/STRAP (MISCELLANEOUS) IMPLANT
INSERT TIBIAL KNEE SZ 5 12MM (Insert) ×2 IMPLANT
KIT TURNOVER KIT A (KITS) IMPLANT
MANIFOLD NEPTUNE II (INSTRUMENTS) ×4 IMPLANT
NDL SAFETY ECLIPSE 18X1.5 (NEEDLE) IMPLANT
NEEDLE HYPO 18GX1.5 SHARP (NEEDLE)
NS IRRIG 1000ML POUR BTL (IV SOLUTION) ×4 IMPLANT
PACK TOTAL KNEE CUSTOM (KITS) ×4 IMPLANT
PENCIL SMOKE EVACUATOR (MISCELLANEOUS) IMPLANT
PIN DRILL FIX HALF THREAD (BIT) ×4 IMPLANT
PIN FIX SIGMA LCS THRD HI (PIN) ×4 IMPLANT
PROTECTOR NERVE ULNAR (MISCELLANEOUS) ×4 IMPLANT
SET HNDPC FAN SPRY TIP SCT (DISPOSABLE) ×2 IMPLANT
SET PAD KNEE POSITIONER (MISCELLANEOUS) ×4 IMPLANT
SUT MNCRL AB 4-0 PS2 18 (SUTURE) ×4 IMPLANT
SUT STRATAFIX PDS+ 0 24IN (SUTURE) ×4 IMPLANT
SUT VIC AB 1 CT1 36 (SUTURE) ×4 IMPLANT
SUT VIC AB 2-0 CT1 27 (SUTURE) ×12
SUT VIC AB 2-0 CT1 TAPERPNT 27 (SUTURE) ×6 IMPLANT
SYR 3ML LL SCALE MARK (SYRINGE) ×4 IMPLANT
TIBIA ATTUNE KNEE SYS BASE SZ6 (Knees) ×4 IMPLANT
TIBIAL INSERT KNEE SZ 5 12MM (Insert) ×4 IMPLANT
TRAY FOLEY MTR SLVR 16FR STAT (SET/KITS/TRAYS/PACK) ×4 IMPLANT
WATER STERILE IRR 1000ML POUR (IV SOLUTION) ×8 IMPLANT
WRAP KNEE MAXI GEL POST OP (GAUZE/BANDAGES/DRESSINGS) ×4 IMPLANT

## 2020-01-16 NOTE — Anesthesia Postprocedure Evaluation (Signed)
Anesthesia Post Note  Patient: Emily Sanchez  Procedure(s) Performed: RIGHT TOTAL KNEE ARTHROPLASTY (Right Knee) KNEE INJECTION (Left Knee)     Patient location during evaluation: PACU Anesthesia Type: Spinal Level of consciousness: oriented and awake and alert Pain management: pain level controlled Vital Signs Assessment: post-procedure vital signs reviewed and stable Respiratory status: spontaneous breathing, respiratory function stable and patient connected to nasal cannula oxygen Cardiovascular status: blood pressure returned to baseline and stable Postop Assessment: no headache, no backache, no apparent nausea or vomiting and spinal receding Anesthetic complications: no   No complications documented.  Last Vitals:  Vitals:   01/16/20 1115 01/16/20 1200  BP: (!) 154/100 (!) 155/93  Pulse: 92 87  Resp: (!) 9 14  Temp:    SpO2: 98% 98%    Last Pain:  Vitals:   01/16/20 1200  TempSrc:   PainSc: [name]

## 2020-01-16 NOTE — Interval H&P Note (Signed)
History and Physical Interval Note:  01/16/2020 6:59 AM  Emily Sanchez  has presented today for surgery, with the diagnosis of Right knee osteoarthritis, left knee osteoarthritis.  The various methods of treatment have been discussed with the patient and family. After consideration of risks, benefits and other options for treatment, the patient has consented to  Procedure(s) with comments: RIGHT TOTAL KNEE ARTHROPLASTY (Right) - 70 mins KNEE INJECTION (Left) as a surgical intervention.  The patient's history has been reviewed, patient examined, no change in status, stable for surgery.  I have reviewed the patient's chart and labs.  Questions were answered to the patient's satisfaction.     Mauri Pole

## 2020-01-16 NOTE — Progress Notes (Signed)
Physical Therapy Treatment Patient Details Name: Emily Sanchez MRN: [id number] DOB: [birth year] Today's Date: 01/16/2020    History of Present Illness Patient is 64 y.o. female s/p Rt TKA on 01/16/20 with PMH significant for OA, HTN, GERD, depression, anxiety, anemia.    PT Comments    Patient seen for additional therapy session to progress mobility for discharge home. Patient remains greatly limited by pain and was unable to advance gait or progress to stair training. She continues to require assist for transfers and bed mobility. Patient instructed on exercises for HEP to facilitate ROM and circulation. Patient will benefit from continued skilled PT interventions to address impairments and progress towards PLOF. Acute PT will follow to progress mobility and stair training in preparation for safe discharge home.     Follow Up Recommendations  Follow surgeon's recommendation for DC plan and follow-up therapies;Home health PT     Equipment Recommendations  Rolling walker with 5" wheels    Recommendations for Other Services       Precautions / Restrictions Precautions Precautions: Fall Restrictions Weight Bearing Restrictions: No Other Position/Activity Restrictions: WBAT    Mobility  Bed Mobility Overal bed mobility: Needs Assistance Bed Mobility: Supine to Sit;Sit to Supine     Supine to sit: Min guard Sit to supine: Min assist   General bed mobility comments: pt requried extra time to sit up to EOB. Patient required min assist to raise Rt LE and return to supine.  Transfers Overall transfer level: Needs assistance Equipment used: Rolling walker (2 wheeled) Transfers: Sit to/from Stand Sit to Stand: Min assist         General transfer comment: pt with good carryover for hand placement/technique. min assist required for power up from EOB and cues for safe reach back and to extend Rt LE when sitting.  Ambulation/Gait Ambulation/Gait assistance: Min assist;Min  guard Gait Distance (Feet): 4 Feet Assistive device: Rolling walker (2 wheeled) Gait Pattern/deviations: Step-to pattern;Decreased stride length;Decreased step length - left;Decreased step length - right;Decreased stance time - right;Decreased weight shift to right;Antalgic Gait velocity: decr   General Gait Details: pt remains limited by pain. antalgic gait and pt shaking due to pain. Distance limited and pt returned to sitting EOB.   Stairs             Wheelchair Mobility    Modified Rankin (Stroke Patients Only)       Balance Overall balance assessment: Needs assistance Sitting-balance support: Feet supported Sitting balance-Leahy Scale: Fair     Standing balance support: During functional activity;Bilateral upper extremity supported Standing balance-Leahy Scale: Poor               Cognition Arousal/Alertness: Awake/alert Behavior During Therapy: WFL for tasks assessed/performed Overall Cognitive Status: Within Functional Limits for tasks assessed                     Exercises Total Joint Exercises Ankle Circles/Pumps: AROM;Both;20 reps;Supine Quad Sets: AROM;Right;Other reps (comment);Supine (7) Heel Slides: AROM;Right;5 reps;Supine    General Comments        Pertinent Vitals/Pain Pain Assessment: 0-10 Pain Score: 8  Pain Location: Rt knee Pain Descriptors / Indicators: Aching;Discomfort;Grimacing;Moaning;Sore Pain Intervention(s): Monitored during session;Limited activity within patient's tolerance;Repositioned;Ice applied;Premedicated before session       Prior Function            PT Goals (current goals can now be found in the care plan section) Acute Rehab PT Goals Patient Stated Goal: have less pain and  be able to get home PT Goal Formulation: With patient Time For Goal Achievement: 01/23/20 Potential to Achieve Goals: Fair Progress towards PT goals: Progressing toward goals    Frequency    7X/week      PT Plan  Current plan remains appropriate    Co-evaluation              AM-PAC PT "6 Clicks" Mobility   Outcome Measure  Help needed turning from your back to your side while in a flat bed without using bedrails?: A Little Help needed moving from lying on your back to sitting on the side of a flat bed without using bedrails?: A Little Help needed moving to and from a bed to a chair (including a wheelchair)?: A Lot Help needed standing up from a chair using your arms (e.g., wheelchair or bedside chair)?: A Lot Help needed to walk in hospital room?: A Little Help needed climbing 3-5 steps with a railing? : A Lot 6 Click Score: 15    End of Session Equipment Utilized During Treatment: Gait belt Activity Tolerance: Patient limited by pain Patient left: in bed;with call bell/phone within reach Nurse Communication: Mobility status PT Visit Diagnosis: Muscle weakness (generalized) (M62.81);Difficulty in walking, not elsewhere classified (R26.2)     Time: 5834-6219 PT Time Calculation (min) (ACUTE ONLY): 18 min  Charges:  $Therapeutic Exercise: 8-22 mins                    Verner Mould, DPT Acute Rehabilitation Services  Office [phone] Pager [phone]  01/16/2020 6:22 PM

## 2020-01-16 NOTE — Discharge Instructions (Signed)
INSTRUCTIONS AFTER JOINT REPLACEMENT   o Remove items at home which could result in a fall. This includes throw rugs or furniture in walking pathways o ICE to the affected joint every three hours while awake for 30 minutes at a time, for at least the first 3-5 days, and then as needed for pain and swelling.  Continue to use ice for pain and swelling. You may notice swelling that will progress down to the foot and ankle.  This is normal after surgery.  Elevate your leg when you are not up walking on it.   o Continue to use the breathing machine you got in the hospital (incentive spirometer) which will help keep your temperature down.  It is common for your temperature to cycle up and down following surgery, especially at night when you are not up moving around and exerting yourself.  The breathing machine keeps your lungs expanded and your temperature down.   DIET:  As you were doing prior to hospitalization, we recommend a well-balanced diet.  DRESSING / WOUND CARE / SHOWERING  Keep the surgical dressing until follow up.  The dressing is water proof, so you can shower without any extra covering.  IF THE DRESSING FALLS OFF or the wound gets wet inside, change the dressing with sterile gauze.  Please use good hand washing techniques before changing the dressing.  Do not use any lotions or creams on the incision until instructed by your surgeon.    ACTIVITY  o Increase activity slowly as tolerated, but follow the weight bearing instructions below.   o No driving for 6 weeks or until further direction given by your physician.  You cannot drive while taking narcotics.  o No lifting or carrying greater than 10 lbs. until further directed by your surgeon. o Avoid periods of inactivity such as sitting longer than an hour when not asleep. This helps prevent blood clots.  o You may return to work once you are authorized by your doctor.     WEIGHT BEARING   Weight bearing as tolerated with assist  device (walker, cane, etc) as directed, use it as long as suggested by your surgeon or therapist, typically at least 4-6 weeks.   EXERCISES  Results after joint replacement surgery are often greatly improved when you follow the exercise, range of motion and muscle strengthening exercises prescribed by your doctor. Safety measures are also important to protect the joint from further injury. Any time any of these exercises cause you to have increased pain or swelling, decrease what you are doing until you are comfortable again and then slowly increase them. If you have problems or questions, call your caregiver or physical therapist for advice.   Rehabilitation is important following a joint replacement. After just a few days of immobilization, the muscles of the leg can become weakened and shrink (atrophy).  These exercises are designed to build up the tone and strength of the thigh and leg muscles and to improve motion. Often times heat used for twenty to thirty minutes before working out will loosen up your tissues and help with improving the range of motion but do not use heat for the first two weeks following surgery (sometimes heat can increase post-operative swelling).   These exercises can be done on a training (exercise) mat, on the floor, on a table or on a bed. Use whatever works the best and is most comfortable for you.    Use music or television while you are exercising so that  the exercises are a pleasant break in your day. This will make your life better with the exercises acting as a break in your routine that you can look forward to.   Perform all exercises about fifteen times, three times per day or as directed.  You should exercise both the operative leg and the other leg as well.  Exercises include:   . Quad Sets - Tighten up the muscle on the front of the thigh (Quad) and hold for 5-10 seconds.   . Straight Leg Raises - With your knee straight (if you were given a brace, keep it on),  lift the leg to 60 degrees, hold for 3 seconds, and slowly lower the leg.  Perform this exercise against resistance later as your leg gets stronger.  . Leg Slides: Lying on your back, slowly slide your foot toward your buttocks, bending your knee up off the floor (only go as far as is comfortable). Then slowly slide your foot back down until your leg is flat on the floor again.  [place]ard Haring Wings: Lying on your back spread your legs to the side as far apart as you can without causing discomfort.  . Hamstring Strength:  Lying on your back, push your heel against the floor with your leg straight by tightening up the muscles of your buttocks.  Repeat, but this time bend your knee to a comfortable angle, and push your heel against the floor.  You may put a pillow under the heel to make it more comfortable if necessary.   A rehabilitation program following joint replacement surgery can speed recovery and prevent re-injury in the future due to weakened muscles. Contact your doctor or a physical therapist for more information on knee rehabilitation.    CONSTIPATION  Constipation is defined medically as fewer than three stools per week and severe constipation as less than one stool per week.  Even if you have a regular bowel pattern at home, your normal regimen is likely to be disrupted due to multiple reasons following surgery.  Combination of anesthesia, postoperative narcotics, change in appetite and fluid intake all can affect your bowels.   YOU MUST use at least one of the following options; they are listed in order of increasing strength to get the job done.  They are all available over the counter, and you may need to use some, POSSIBLY even all of these options:    Drink plenty of fluids (prune juice may be helpful) and high fiber foods Colace 100 mg by mouth twice a day  Senokot for constipation as directed and as needed Dulcolax (bisacodyl), take with full glass of water  Miralax (polyethylene glycol)  once or twice a day as needed.  If you have tried all these things and are unable to have a bowel movement in the first 3-4 days after surgery call either your surgeon or your primary doctor.    If you experience loose stools or diarrhea, hold the medications until you stool forms back up.  If your symptoms do not get better within 1 week or if they get worse, check with your doctor.  If you experience "the worst abdominal pain ever" or develop nausea or vomiting, please contact the office immediately for further recommendations for treatment.   ITCHING:  If you experience itching with your medications, try taking only a single pain pill, or even half a pain pill at a time.  You can also use Benadryl over the counter for itching or also to  help with sleep.   TED HOSE STOCKINGS:  Use stockings on both legs until for at least 2 weeks or as directed by physician office. They may be removed at night for sleeping.  MEDICATIONS:  See your medication summary on the "After Visit Summary" that nursing will review with you.  You may have some home medications which will be placed on hold until you complete the course of blood thinner medication.  It is important for you to complete the blood thinner medication as prescribed.  PRECAUTIONS:  If you experience chest pain or shortness of breath - call 911 immediately for transfer to the hospital emergency department.   If you develop a fever greater that 101 F, purulent drainage from wound, increased redness or drainage from wound, foul odor from the wound/dressing, or calf pain - CONTACT YOUR SURGEON.                                                   FOLLOW-UP APPOINTMENTS:  If you do not already have a post-op appointment, please call the office for an appointment to be seen by your surgeon.  Guidelines for how soon to be seen are listed in your "After Visit Summary", but are typically between 1-4 weeks after surgery.  OTHER INSTRUCTIONS:   Knee  Replacement:  Do not place pillow under knee, focus on keeping the knee straight while resting.   DENTAL ANTIBIOTICS:  In most cases prophylactic antibiotics for Dental procdeures after total joint surgery are not necessary.  Exceptions are as follows:  1. History of prior total joint infection  2. Severely immunocompromised (Organ Transplant, cancer chemotherapy, Rheumatoid biologic meds such as Culver)  3. Poorly controlled diabetes (A1C &gt; 8.0, blood glucose over 200)  If you have one of these conditions, contact your surgeon for an antibiotic prescription, prior to your dental procedure.   MAKE SURE YOU:  . Understand these instructions.  . Get help right away if you are not doing well or get worse.    Thank you for letting us be a part of your medical care team.  It is a privilege we respect greatly.  We hope these instructions will help you stay on track for a fast and full recovery!

## 2020-01-16 NOTE — Evaluation (Signed)
Physical Therapy Evaluation Patient Details Name: Emily Sanchez MRN: [id number] DOB: 02-15-1956 Today's Date: 01/16/2020   History of Present Illness  Patient is 64 y.o. female s/p Rt TKA on 01/16/20 with PMH significant for OA, HTN, GERD, depression, anxiety, anemia.  Clinical Impression  Emily Sanchez is a 64 y.o. female POD 0 s/p Rt TKA. Patient reports independence with occasional use of SPC for longer distances with mobility at baseline. Patient is now limited by functional impairments (see PT problem list below) and requires min-mod assist for transfers and gait with RW. Patient was able to ambulate ~70 feet with RW and min assist. Patient limited this session by 10/10 pain and unable to complete stair training. Patient will benefit from continued skilled PT interventions to address impairments and progress towards PLOF. Acute PT will follow to progress mobility and stair training in preparation for safe discharge home.     Follow Up Recommendations Follow surgeon's recommendation for DC plan and follow-up therapies;Home health PT    Equipment Recommendations  Rolling walker with 5" wheels    Recommendations for Other Services       Precautions / Restrictions Precautions Precautions: Fall Restrictions Weight Bearing Restrictions: No Other Position/Activity Restrictions: WBAT      Mobility  Bed Mobility Overal bed mobility: Needs Assistance Bed Mobility: Supine to Sit;Sit to Supine     Supine to sit: Min assist Sit to supine: Min assist;Mod assist   General bed mobility comments: Pt required min assist to bring LE off EOB and raise fully upright. Min-mod assist for bringing LE's back on to bed secondary to pain.   Transfers Overall transfer level: Needs assistance Equipment used: Rolling walker (2 wheeled) Transfers: Sit to/from Stand Sit to Stand: Min assist;Mod assist         General transfer comment: cues for safe hand placement and technique with  RW. pt required min-mod assist for power up and to steady while transitioning hands to RW. Assist required from multiple suerfaces (EOB, toilet, wheelchair).  Ambulation/Gait Ambulation/Gait assistance: Min assist;Min guard Gait Distance (Feet): 70 Feet Assistive device: Rolling walker (2 wheeled) Gait Pattern/deviations: Step-to pattern;Decreased stride length;Decreased step length - left;Decreased step length - right;Decreased stance time - right;Decreased weight shift to right;Antalgic Gait velocity: decr   General Gait Details: pt required cues for safe step pattern and proximity to RW, assist required throughout to manage walker position safely.   Stairs            Wheelchair Mobility    Modified Rankin (Stroke Patients Only)       Balance Overall balance assessment: Needs assistance Sitting-balance support: Feet supported Sitting balance-Leahy Scale: Fair     Standing balance support: During functional activity;Bilateral upper extremity supported Standing balance-Leahy Scale: Poor              Pertinent Vitals/Pain Pain Assessment: 0-10 Pain Score: 10-Worst pain ever Pain Location: Rt knee Pain Descriptors / Indicators: Aching;Discomfort;Grimacing;Moaning;Sore Pain Intervention(s): Limited activity within patient's tolerance;Monitored during session;Repositioned;Ice applied    Home Living Family/patient expects to be discharged to:: Private residence Living Arrangements: Spouse/significant other Available Help at Discharge: Family Type of Home: House Home Access: Stairs to enter Entrance Stairs-Rails: Chemical engineer of Steps: 5 in front Home Layout: One level Home Equipment: [place] - single point Additional Comments: pt reports her sister can assist her and her husband. pt's husband has parkinsons and requires assist intermittently for mobilitiy and ADL's.    Prior Function Level of Independence: Independent  Hand  Dominance   Dominant Hand: Right    Extremity/Trunk Assessment   Upper Extremity Assessment Upper Extremity Assessment: Overall WFL for tasks assessed    Lower Extremity Assessment Lower Extremity Assessment: Overall WFL for tasks assessed    Cervical / Trunk Assessment Cervical / Trunk Assessment: Normal  Communication   Communication: No difficulties  Cognition Arousal/Alertness: Awake/alert Behavior During Therapy: WFL for tasks assessed/performed Overall Cognitive Status: Within Functional Limits for tasks assessed                 General Comments      Exercises     Assessment/Plan    PT Assessment Patient needs continued PT services  PT Problem List Decreased strength;Decreased balance;Decreased activity tolerance;Decreased range of motion;Decreased mobility;Decreased knowledge of use of DME;Pain;Decreased knowledge of precautions       PT Treatment Interventions DME instruction;Gait training;Functional mobility training;Stair training;Therapeutic activities;Therapeutic exercise;Balance training;Patient/family education    PT Goals (Current goals can be found in the Care Plan section)  Acute Rehab PT Goals Patient Stated Goal: have less pain and be able to get home PT Goal Formulation: With patient Time For Goal Achievement: 01/23/20 Potential to Achieve Goals: Fair    Frequency 7X/week   Barriers to discharge           AM-PAC PT "6 Clicks" Mobility  Outcome Measure Help needed turning from your back to your side while in a flat bed without using bedrails?: A Little Help needed moving from lying on your back to sitting on the side of a flat bed without using bedrails?: A Little Help needed moving to and from a bed to a chair (including a wheelchair)?: A Lot Help needed standing up from a chair using your arms (e.g., wheelchair or bedside chair)?: A Lot Help needed to walk in hospital room?: A Little Help needed climbing 3-5 steps with a railing? : A  Lot 6 Click Score: 15    End of Session Equipment Utilized During Treatment: Gait belt Activity Tolerance: Patient limited by pain Patient left: in bed;with call bell/phone within reach Nurse Communication: Mobility status PT Visit Diagnosis: Muscle weakness (generalized) (M62.81);Difficulty in walking, not elsewhere classified (R26.2)    Time: 7737-3668 PT Time Calculation (min) (ACUTE ONLY): 40 min   Charges:   PT Evaluation $PT Eval Low Complexity: 1 Low PT Treatments $Gait Training: 8-22 mins $Therapeutic Activity: 8-22 mins       Verner Mould, DPT Acute Rehabilitation Services  Office [phone] Pager [phone]  01/16/2020 3:36 PM

## 2020-01-16 NOTE — Op Note (Signed)
NAME:  Emily Sanchez                      MEDICAL RECORD NO.:  [id number]                             FACILITY:  [facility]      PHYSICIAN:  Pietro Cassis. Alvan Dame, M.D.  DATE OF BIRTH:  Feb 24, 1956      DATE OF PROCEDURE:  01/16/2020                                     OPERATIVE REPORT         PREOPERATIVE DIAGNOSIS: 1. Right knee osteoarthritis. 2. Left knee osteoarthritis     POSTOPERATIVE DIAGNOSIS:  1. Right knee osteoarthritis. 2. Left knee osteoarthritis     FINDINGS:  The patient was noted to have complete loss of cartilage and   bone-on-bone arthritis with associated osteophytes in the lateral and patellofemoral compartments of   the knee with a significant osteophytes, synovitis and associated effusion.  The patient had failed months of conservative treatment including medications, injection therapy, activity modification.     PROCEDURE:  Right total knee replacement. 2. Left knee intra-articular injection (2m Depo)     COMPONENTS USED:  DePuy Attune rotating platform posterior stabilized knee   system, a size 5 femur, 6 tibia, size 12 mm PS AOX insert, and 35 anatomic patellar   button.      SURGEON:  MPietro Cassis OAlvan Dame M.D.      ASSISTANT:  AGriffith Citron PA-C.      ANESTHESIA:  Regional and Spinal.      SPECIMENS:  None.      COMPLICATION:  None.      DRAINS:  None.  EBL: <100cc      TOURNIQUET TIME:   Total Tourniquet Time Documented: Thigh (Right) - 43 minutes Total: Thigh (Right) - 43 minutes  .      The patient was stable to the recovery room.      INDICATION FOR PROCEDURE:  Emily Sanchez a 64y.o. female patient of   mine.  The patient had been seen, evaluated, and treated for months conservatively in the   office with medication, activity modification, and injections.  The patient had   radiographic changes of bone-on-bone arthritis with endplate sclerosis and osteophytes noted.  Based on the radiographic changes and failed conservative  measures, the patient   decided to proceed with definitive treatment, total knee replacement.  Risks of infection, DVT, component failure, need for revision surgery, neurovascular injury were reviewed in the office setting.  The postop course was reviewed stressing the efforts to maximize post-operative satisfaction and function.  Consent was obtained for benefit of pain   relief.      PROCEDURE IN DETAIL:  The patient was brought to the operative theater.   Once adequate anesthesia, preoperative antibiotics, 2 gm of Ancef,1 gm of Tranexamic Acid, and 10 mg of Decadron administered, the patient was positioned supine with a right thigh tourniquet placed.  The  right lower extremity was prepped and draped in sterile fashion.  A time-   out was performed identifying the patient, planned procedure, and the appropriate extremity.      The right [facility]  The leg was  exsanguinated, tourniquet elevated to 250 mmHg.  A midline incision was   made followed by median parapatellar arthrotomy.  Following initial   exposure, attention was first directed to the patella.  Precut   measurement was noted to be 22 mm.  I resected down to 13 mm and used a   35 anatomic patellar button to restore patellar height as well as cover the cut surface.      The lug holes were drilled and a metal shim was placed to protect the   patella from retractors and saw blade during the procedure.      At this point, attention was now directed to the femur.  The femoral   canal was opened with a drill, irrigated to try to prevent fat emboli.  An   intramedullary rod was passed at 5 degrees valgus, 9 mm of bone was   resected off the distal femur.  Following this resection, the tibia was   subluxated anteriorly.  Using the extramedullary guide, 2 mm of bone was resected off   the proximal medial tibia.  We confirmed the gap would be   stable medially and laterally with a size 6 spacer  block as well as confirmed that the tibial cut was perpendicular in the coronal plane, checking with an alignment rod.      Once this was done, I sized the femur to be a size 5 in the anterior-   posterior dimension, chose a standard component based on medial and   lateral dimension.  The size 5 rotation block was then pinned in   position anterior referenced using the C-clamp to set rotation.  The   anterior, posterior, and  chamfer cuts were made without difficulty nor   notching making certain that I was along the anterior cortex to help   with flexion gap stability.      The final box cut was made off the lateral aspect of distal femur.  She had significant knee osteoarthritis with significant medial, lateral and significant posterior osteophytes.  At this point I used laminar spreader to open up the posterior aspect the knee.  I used a curved osteotome and remove significant posterior osteophyte off the medial lateral aspect of the femur.  Medial lateral osteophytes removed as well.     At this point, the tibia was sized to be a size 6.  The size 6 tray was   then pinned in position through the medial third of the tubercle,   drilled, and keel punched.  Trial reduction was now carried with a 5 femur,  6 tibia, a size 12 mm PS insert, and the 35 anatomic patella botton.  The knee was brought to full extension with good flexion stability with the patella   tracking through the trochlea without application of pressure.  Given   all these findings the trial components removed.  Final components were   opened and cement was mixed.  The knee was irrigated with normal saline solution and pulse lavage.  The synovial lining was   then injected with 30 cc of 0.25% Marcaine with epinephrine, 1 cc of Toradol and 30 cc of NS for a total of 61 cc.     Final implants were then cemented onto cleaned and dried cut surfaces of bone with the knee brought to extension with a size 12 mm PS trial insert.       Once the cement had fully cured, excess cement was removed   throughout the  knee.  I confirmed that I was satisfied with the range of   motion and stability, and the final size 12 mm PS AOX insert was chosen.  It was   placed into the knee.      The tourniquet had been let down at 43 minutes.  No significant   hemostasis was required.  The extensor mechanism was then reapproximated using #1 Vicryl and #1 Stratafix sutures with the knee   in flexion.  The   remaining wound was closed with 2-0 Vicryl and running 4-0 Monocryl.   The knee was cleaned, dried, dressed sterilely using Dermabond and   Aquacel dressing.    After the right knee was dressed sterilely from the procedure attention was directed to the left knee. Under a Betadine prep the left knee was injected with 80 mg of Depo-Medrol and 6 cc of Marcaine.  The injection site was covered with a Band-Aid.  The patient was then   brought to recovery room in stable condition, tolerating the procedure   well.   Please note that Physician Assistant, Griffith Citron, PA-C was present for the entirety of the case, and was utilized for pre-operative positioning, peri-operative retractor management, general facilitation of the procedure and for primary wound closure at the end of the case.              Pietro Cassis Alvan Dame, M.D.    01/16/2020 8:58 AM

## 2020-01-16 NOTE — Transfer of Care (Signed)
Immediate Anesthesia Transfer of Care Note  Patient: Emily Sanchez  Procedure(s) Performed: RIGHT TOTAL KNEE ARTHROPLASTY (Right Knee) KNEE INJECTION (Left Knee)  Patient Location: PACU  Anesthesia Type:Spinal  Level of Consciousness: awake, alert , oriented and patient cooperative  Airway & Oxygen Therapy: Patient Spontanous Breathing and Patient connected to face mask oxygen  Post-op Assessment: Report given to RN, Post -op Vital signs reviewed and stable and Patient moving all extremities  Post vital signs: Reviewed and stable  Last Vitals:  Vitals Value Taken Time  BP 118/38 01/16/20 0926  Temp    Pulse 106 01/16/20 0929  Resp 18 01/16/20 0929  SpO2 100 % 01/16/20 0929  Vitals shown include unvalidated device data.  Last Pain:  Vitals:   01/16/20 0600  TempSrc:   PainSc: 5       Patients Stated Pain Goal: 4 (40/98/28 6751)  Complications: No complications documented.

## 2020-01-16 NOTE — Anesthesia Procedure Notes (Signed)
Anesthesia Regional Block: Adductor canal block   Pre-Anesthetic Checklist: ,, timeout performed, Correct Patient, Correct Site, Correct Laterality, Correct Procedure, Correct Position, site marked, Risks and benefits discussed,  Surgical consent,  Pre-op evaluation,  At surgeon's request and post-op pain management  Laterality: Right  Prep: chloraprep       Needles:  Injection technique: Single-shot  Needle Type: Echogenic Stimulator Needle     Needle Length: 9cm  Needle Gauge: 21     Additional Needles:   Procedures:,,,, ultrasound used (permanent image in chart),,,,  Narrative:  Start time: 01/16/2020 6:55 AM End time: 01/16/2020 7:00 AM Injection made incrementally with aspirations every 5 mL.  Performed by: Personally  Anesthesiologist: Effie Berkshire, MD  Additional Notes: Patient tolerated the procedure well. Local anesthetic introduced in an incremental fashion under minimal resistance after negative aspirations. No paresthesias were elicited. After completion of the procedure, no acute issues were identified and patient continued to be monitored by RN.

## 2020-01-16 NOTE — Anesthesia Procedure Notes (Signed)
Spinal  Patient location during procedure: OR Start time: 01/16/2020 7:20 AM End time: 01/16/2020 7:22 AM Staffing Performed: anesthesiologist  Anesthesiologist: Effie Berkshire, MD Preanesthetic Checklist Completed: patient identified, IV checked, site marked, risks and benefits discussed, surgical consent, monitors and equipment checked, pre-op evaluation and timeout performed Spinal Block Patient position: sitting Prep: DuraPrep and site prepped and draped Location: L3-4 Injection technique: single-shot Needle Needle type: Pencan  Needle gauge: 24 G Needle length: 10 cm Needle insertion depth: 10 cm Additional Notes Patient tolerated well. No immediate complications.  1st attempt by CRNA

## 2020-01-17 ENCOUNTER — Encounter (HOSPITAL_COMMUNITY): Payer: Self-pay | Admitting: Orthopedic Surgery

## 2020-01-17 DIAGNOSIS — M1711 Unilateral primary osteoarthritis, right knee: Secondary | ICD-10-CM | POA: Diagnosis not present

## 2020-01-17 DIAGNOSIS — E669 Obesity, unspecified: Secondary | ICD-10-CM | POA: Diagnosis present

## 2020-01-17 LAB — CBC
HCT: 30.2 % — ABNORMAL LOW (ref 36.0–46.0)
Hemoglobin: 9.9 g/dL — ABNORMAL LOW (ref 12.0–15.0)
MCH: 32.5 pg (ref 26.0–34.0)
MCHC: 32.8 g/dL (ref 30.0–36.0)
MCV: 99 fL (ref 80.0–100.0)
Platelets: 237 10*3/uL (ref 150–400)
RBC: 3.05 MIL/uL — ABNORMAL LOW (ref 3.87–5.11)
RDW: 13.4 % (ref 11.5–15.5)
WBC: 8.4 10*3/uL (ref 4.0–10.5)
nRBC: 0 % (ref 0.0–0.2)

## 2020-01-17 LAB — BASIC METABOLIC PANEL
Anion gap: 8 (ref 5–15)
BUN: 16 mg/dL (ref 8–23)
CO2: 26 mmol/L (ref 22–32)
Calcium: 8.9 mg/dL (ref 8.9–10.3)
Chloride: 104 mmol/L (ref 98–111)
Creatinine, Ser: 1.07 mg/dL — ABNORMAL HIGH (ref 0.44–1.00)
GFR calc Af Amer: >60 mL/min (ref >60)
GFR calc non Af Amer: 55 mL/min — ABNORMAL LOW (ref >60)
Glucose, Bld: 160 mg/dL — ABNORMAL HIGH (ref 70–99)
Potassium: 5 mmol/L (ref 3.5–5.1)
Sodium: 138 mmol/L (ref 135–145)

## 2020-01-17 MED ORDER — OXYCODONE HCL 10 MG PO TABS: 10.0000 mg | ORAL_TABLET | ORAL | 0 refills | Status: DC | PRN

## 2020-01-17 MED ORDER — ACETAMINOPHEN 500 MG PO TABS: 1000.0000 mg | ORAL_TABLET | Freq: Three times a day (TID) | ORAL | 0 refills | Status: AC

## 2020-01-17 NOTE — TOC Transition Note (Signed)
Transition of Care [facility]) - CM/SW Discharge Note   Patient Details  Name: Emily Sanchez MRN: [id number] Date of Birth: [birth year]  Transition of Care [facility]) CM/SW Contact:  Lennart Pall, LCSW Phone Number: 01/17/2020, 11:23 AM   Clinical Narrative:     Met briefly with pt to confirm she has received her rolling walker (in room) and plans for OPPT at [facility].  No further TOC needs.  Final next level of care: OP Rehab Barriers to Discharge: No Barriers Identified   Patient Goals and CMS Choice        Discharge Placement                       Discharge Plan and Services                DME Arranged: Walker rolling DME Agency: [employer] Date DME Agency Contacted:  (ordered for pt prior to surgery)                Social Determinants of Health (SDOH) Interventions     Readmission Risk Interventions No flowsheet data found.

## 2020-01-17 NOTE — Progress Notes (Signed)
Physical Therapy Treatment Patient Details Name: Emily Sanchez MRN: [id number] DOB: October 24, 1955 Today's Date: 01/17/2020    History of Present Illness Patient is 64 y.o. female s/p Rt TKA on 01/16/20 with PMH significant for OA, HTN, GERD, depression, anxiety, anemia.    PT Comments    Patient seen for additional therapy session to progress mobility with gait and stairs. Pt had good recall for safety with hand placement and step pattern using walker and for stair negotiation. Pt verbalized safe guarding from family to enter home. Educated on additional HEP exercises and discussed need for assistance at home as pt's husband needs physical help intermittently. Educated pt that she should not provide this help at this time due to the risk of falling. Pt verbalized understanding. She is safe to discharge home at this time. Acute will follow as able.   Follow Up Recommendations  Follow surgeon's recommendation for DC plan and follow-up therapies;Home health PT     Equipment Recommendations  Rolling walker with 5" wheels    Recommendations for Other Services       Precautions / Restrictions Precautions Precautions: Fall Restrictions Weight Bearing Restrictions: No Other Position/Activity Restrictions: WBAT    Mobility  Bed Mobility Overal bed mobility: Needs Assistance Bed Mobility: Supine to Sit     Supine to sit: Supervision     General bed mobility comments: pt required some increased time, bed flat.  Transfers Overall transfer level: Needs assistance Equipment used: Rolling walker (2 wheeled) Transfers: Sit to/from Stand Sit to Stand: Min assist         General transfer comment: pt with safe hand placement on RW, no assist required for power up from EOB  Ambulation/Gait Ambulation/Gait assistance: Supervision Gait Distance (Feet): 60 Feet Assistive device: Rolling walker (2 wheeled) Gait Pattern/deviations: Step-to pattern;Decreased stride length;Decreased  step length - left;Decreased step length - right;Decreased stance time - right;Decreased weight shift to right;Antalgic Gait velocity: decr   General Gait Details: supervision for safety, cues initially for step pattern and pt maintained safe proximity to RW throuhgout. no overt LOB or buckling at Rt knee noted.   Stairs Stairs: Yes Stairs assistance: Min guard Stair Management: One rail Right;Sideways;Step to pattern Number of Stairs: 4 (2x2) General stair comments: pt with good recall for safe sequencing on steps, pt using one rail. Pt also verbalized safe gaurding position for family to provide.    Wheelchair Mobility    Modified Rankin (Stroke Patients Only)       Balance Overall balance assessment: Needs assistance Sitting-balance support: Feet supported Sitting balance-Leahy Scale: Fair     Standing balance support: During functional activity;Bilateral upper extremity supported Standing balance-Leahy Scale: Fair                              Cognition Arousal/Alertness: Awake/alert Behavior During Therapy: WFL for tasks assessed/performed Overall Cognitive Status: Within Functional Limits for tasks assessed                     Exercises Total Joint Exercises Ankle Circles/Pumps: AROM;Both;20 reps;Supine Quad Sets: AROM;Right;10 reps;Supine Short Arc Quad: AROM;Right;10 reps;Supine Heel Slides: AROM;Right;10 reps;Supine Hip ABduction/ADduction: AROM;Right;10 reps;Supine    General Comments        Pertinent Vitals/Pain Pain Assessment: 0-10 Pain Score: 4  Pain Location: Rt knee Pain Descriptors / Indicators: Aching;Discomfort;Grimacing;Moaning;Sore Pain Intervention(s): Limited activity within patient's tolerance;Monitored during session;Repositioned    Home Living  Prior Function            PT Goals (current goals can now be found in the care plan section) Acute Rehab PT Goals Patient Stated Goal: have  less pain and be able to get home PT Goal Formulation: With patient Time For Goal Achievement: 01/23/20 Potential to Achieve Goals: Fair Progress towards PT goals: Progressing toward goals    Frequency    7X/week      PT Plan Current plan remains appropriate    Co-evaluation              AM-PAC PT "6 Clicks" Mobility   Outcome Measure  Help needed turning from your back to your side while in a flat bed without using bedrails?: A Little Help needed moving from lying on your back to sitting on the side of a flat bed without using bedrails?: A Little Help needed moving to and from a bed to a chair (including a wheelchair)?: A Little Help needed standing up from a chair using your arms (e.g., wheelchair or bedside chair)?: A Little Help needed to walk in hospital room?: A Little Help needed climbing 3-5 steps with a railing? : A Little 6 Click Score: 18    End of Session Equipment Utilized During Treatment: Gait belt Activity Tolerance: Patient limited by pain Patient left: in bed;with call bell/phone within reach Nurse Communication: Mobility status PT Visit Diagnosis: Muscle weakness (generalized) (M62.81);Difficulty in walking, not elsewhere classified (R26.2)     Time: 2080-2233 PT Time Calculation (min) (ACUTE ONLY): 32 min  Charges:  $Gait Training: 8-22 mins $Therapeutic Exercise: 8-22 mins                     Verner Mould, DPT Acute Rehabilitation Services  Office [phone] Pager [phone]  01/17/2020 2:54 PM

## 2020-01-17 NOTE — Progress Notes (Signed)
     Subjective: 1 Day Post-Op Procedure(s) (LRB): RIGHT TOTAL KNEE ARTHROPLASTY (Right) KNEE INJECTION (Left)   Patient reports pain as mild, pain controlled. No reported events throughout the night.  Dr. Alvan Dame discussed the procedure, findings and expectations moving forward.  Ready to be discharged home, if they do well with PT.  Follow up in the clinic in 2 weeks.  Knows to call with any questions or concerns.     [name] anticipated LOS is less than 2 midnights, meeting these requirements: - Younger than 69 - Lives within 1 hour of care - Has a competent adult at home to recover with post-op recover - NO history of  - Chronic pain requiring opiods  - Diabetes  - Coronary Artery Disease  - Heart failure  - Heart attack  - Stroke  - DVT/VTE  - Cardiac arrhythmia  - Respiratory Failure/COPD  - Anemia  - Advanced Liver disease     Objective:   VITALS:   Vitals:   01/17/20 0020 01/17/20 0423  BP: (!) 157/91 123/85  Pulse: 87 82  Resp: 16 16  Temp: (!) 97.5 F (36.4 C) 97.7 F (36.5 C)  SpO2: 97% 100%    Dorsiflexion/Plantar flexion intact Incision: dressing C/D/I No cellulitis present Compartment soft  LABS Recent Labs    01/17/20 0302  HGB 9.9*  HCT 30.2*  WBC 8.4  PLT 237    Recent Labs    01/17/20 0302  NA 138  K 5.0  BUN 16  CREATININE 1.07*  GLUCOSE 160*     Assessment/Plan: 1 Day Post-Op Procedure(s) (LRB): RIGHT TOTAL KNEE ARTHROPLASTY (Right) KNEE INJECTION (Left) Foley cath d/c'ed Advance diet Up with therapy D/C IV fluids Discharge home  Follow up in 2 weeks at [facility] Follow up with OLIN,Deajah Erkkila D in 2 weeks.  Contact information:  [facility] [address] [phone]    Obese (BMI 30-39.9) Estimated body mass index is 31.96 kg/m as calculated from the following:   Height as of this encounter: 5' 5.5" (1.664 m).   Weight as of this encounter: 88.5 kg. Patient  also counseled that weight may inhibit the healing process Patient counseled that losing weight will help with future health issues      Emily Orleans PA-C  [facility]  [place] [address], [address], [place], [place] [address] Phone: [phone] [email or web address] Facebook  [employer]

## 2020-01-17 NOTE — Progress Notes (Signed)
Physical Therapy Treatment Patient Details Name: Emily Sanchez MRN: [id number] DOB: August 03, 1955 Today's Date: 01/17/2020    History of Present Illness Patient is 64 y.o. female s/p Rt TKA on 01/16/20 with PMH significant for OA, HTN, GERD, depression, anxiety, anemia.    PT Comments    Patient making good progress and pain more controlled this date. Patient able to advance gait training today and increased distance to 100' with min guard, pt demonstrated safe use of RW. Stair training initiated and pt completed stairs with one rail and side step pattern. HEP reviewed for seated exercises. Patient will continue to benefit from skilled PT interventions to progress mobility. Acute PT will follow.    Follow Up Recommendations  Follow surgeon's recommendation for DC plan and follow-up therapies;Home health PT     Equipment Recommendations  Rolling walker with 5" wheels    Recommendations for Other Services       Precautions / Restrictions Precautions Precautions: Fall Restrictions Weight Bearing Restrictions: No Other Position/Activity Restrictions: WBAT    Mobility  Bed Mobility Overal bed mobility: Needs Assistance Bed Mobility: Supine to Sit     Supine to sit: Min guard     General bed mobility comments: pt required some increased time and HOB slightly elevated.  Transfers Overall transfer level: Needs assistance Equipment used: Rolling walker (2 wheeled) Transfers: Sit to/from Stand Sit to Stand: Min assist         General transfer comment: pt with safe hand   Ambulation/Gait Ambulation/Gait assistance: Min guard Gait Distance (Feet): 100 Feet Assistive device: Rolling walker (2 wheeled) Gait Pattern/deviations: Step-to pattern;Decreased stride length;Decreased step length - left;Decreased step length - right;Decreased stance time - right;Decreased weight shift to right;Antalgic Gait velocity: decr   General Gait Details: min guard for safety, cues  initially for step pattern and pt maintained safe proximity to RW throuhgout. no overt LOB or buckling at Rt knee noted.   Stairs Stairs: Yes Stairs assistance: Min guard Stair Management: One rail Right;Sideways;Step to pattern Number of Stairs: 2 General stair comments: cues for safe step pattern "up with good, down with bad" no overt LOB noted and pt educated on safe guarding for family to provide.   Wheelchair Mobility    Modified Rankin (Stroke Patients Only)       Balance Overall balance assessment: Needs assistance Sitting-balance support: Feet supported Sitting balance-Leahy Scale: Fair     Standing balance support: During functional activity;Bilateral upper extremity supported Standing balance-Leahy Scale: Fair                              Cognition Arousal/Alertness: Awake/alert Behavior During Therapy: WFL for tasks assessed/performed Overall Cognitive Status: Within Functional Limits for tasks assessed                                        Exercises Total Joint Exercises Ankle Circles/Pumps: AROM;Both;20 reps;Supine Long Arc Quad: AROM;Right;5 reps;Seated Knee Flexion: AROM;AAROM;Right;5 reps;Seated    General Comments        Pertinent Vitals/Pain Pain Assessment: 0-10 Pain Score: 5  Pain Location: Rt knee Pain Descriptors / Indicators: Aching;Discomfort;Grimacing;Moaning;Sore Pain Intervention(s): Limited activity within patient's tolerance;Monitored during session;RN gave pain meds during session;Repositioned;Ice applied    Home Living  Prior Function            PT Goals (current goals can now be found in the care plan section) Acute Rehab PT Goals Patient Stated Goal: have less pain and be able to get home PT Goal Formulation: With patient Time For Goal Achievement: 01/23/20 Potential to Achieve Goals: Fair Progress towards PT goals: Progressing toward goals    Frequency     7X/week      PT Plan Current plan remains appropriate    Co-evaluation              AM-PAC PT "6 Clicks" Mobility   Outcome Measure  Help needed turning from your back to your side while in a flat bed without using bedrails?: A Little Help needed moving from lying on your back to sitting on the side of a flat bed without using bedrails?: A Little Help needed moving to and from a bed to a chair (including a wheelchair)?: A Little Help needed standing up from a chair using your arms (e.g., wheelchair or bedside chair)?: A Little Help needed to walk in hospital room?: A Little Help needed climbing 3-5 steps with a railing? : A Little 6 Click Score: 18    End of Session Equipment Utilized During Treatment: Gait belt Activity Tolerance: Patient limited by pain Patient left: in bed;with call bell/phone within reach Nurse Communication: Mobility status PT Visit Diagnosis: Muscle weakness (generalized) (M62.81);Difficulty in walking, not elsewhere classified (R26.2)     Time: 3013-1438 PT Time Calculation (min) (ACUTE ONLY): 33 min  Charges:  $Gait Training: 8-22 mins $Therapeutic Exercise: 8-22 mins                     Verner Mould, DPT Acute Rehabilitation Services  Office [phone] Pager [phone]  01/17/2020 12:02 PM

## 2020-01-24 NOTE — Discharge Summary (Signed)
Physician Discharge Summary   Patient ID: Emily Sanchez MRN: [id number] DOB/AGE: 1955-10-21 64 y.o.  Admit date: 01/16/2020 Discharge date: 01/17/2020  Primary Diagnosis: 1. Right knee osteoarthritis. 2. Left knee osteoarthritis  Admission Diagnoses:  Past Medical History:  Diagnosis Date  . Anemia   . Anxiety   . Chronic kidney disease    stage 3 - [facility]   . Crohn's disease ([place])   . Depression   . Enteritis due to Clostridium difficile 08/16/2012  . 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 GERD (gastroesophageal reflux disease)   . HTN (hypertension)    lst blood pressure medication was 3 months ago   . Ileostomy in place [facility])   . Low back pain    FMS  . Osteoarthritis    Dr Alvan Dame; both knees  . Perianal pain    Chronic, Post-Op  . Pneumonia    as a child   . PONV (postoperative nausea and vomiting)   . Transfusion history    none recent  . Ulcerative colitis ([place])    Dr Sharlett Iles   Discharge Diagnoses:   Principal Problem:   Right knee OA Active Problems:   S/P right TKA   S/P knee replacement   Obese  Estimated body mass index is 31.96 kg/m as calculated from the following:   Height as of this encounter: 5' 5.5" (1.664 m).   Weight as of this encounter: 88.5 kg.  Procedure:  Procedure(s) (LRB): RIGHT TOTAL KNEE ARTHROPLASTY (Right) KNEE INJECTION (Left)   Consults: None  HPI: Emily Sanchez is a 64 y.o. female patient of   mine.  The patient had been seen, evaluated, and treated for months conservatively in the   office with medication, activity modification, and injections.  The patient had   radiographic changes of bone-on-bone arthritis with endplate sclerosis and osteophytes noted.  Based on the radiographic changes and failed conservative measures, the patient   decided to proceed with definitive treatment, total knee replacement.  Risks of infection, DVT, component failure, need for revision surgery, neurovascular injury  were reviewed in the office setting.  The postop course was reviewed stressing the efforts to maximize post-operative satisfaction and function.  Consent was obtained for benefit of pain   relief.   Laboratory Data: Admission on 01/16/2020, Discharged on 01/17/2020  Component Date Value Ref Range Status  . WBC 01/17/2020 8.4  4.0 - 10.5 K/uL Final  . RBC 01/17/2020 3.05* 3.87 - 5.11 MIL/uL Final  . Hemoglobin 01/17/2020 9.9* 12.0 - 15.0 g/dL Final  . HCT 01/17/2020 30.2* 36 - 46 % Final  . MCV 01/17/2020 99.0  80.0 - 100.0 fL Final  . MCH 01/17/2020 32.5  26.0 - 34.0 pg Final  . MCHC 01/17/2020 32.8  30.0 - 36.0 g/dL Final  . RDW 01/17/2020 13.4  11.5 - 15.5 % Final  . Platelets 01/17/2020 237  150 - 400 K/uL Final  . nRBC 01/17/2020 0.0  0.0 - 0.2 % Final   Performed at [facility], [place] [address] [place], [place] [address]  . Sodium 01/17/2020 138  135 - 145 mmol/L Final  . Potassium 01/17/2020 5.0  3.5 - 5.1 mmol/L Final  . Chloride 01/17/2020 104  98 - 111 mmol/L Final  . CO2 01/17/2020 26  22 - 32 mmol/L Final  . Glucose, Bld 01/17/2020 160* 70 - 99 mg/dL Final   Glucose reference range applies only to samples taken after fasting for at least 8 hours.  Marland Kitchen  BUN 01/17/2020 16  8 - 23 mg/dL Final  . Creatinine, Ser 01/17/2020 1.07* 0.44 - 1.00 mg/dL Final  . Calcium 01/17/2020 8.9  8.9 - 10.3 mg/dL Final  . GFR calc non Af Amer 01/17/2020 55* >60 mL/min Final  . GFR calc Af Amer 01/17/2020 >60  >60 mL/min Final  . Anion gap 01/17/2020 8  5 - 15 Final   Performed at [facility], [place] [address], [place], [place] [address]  Hospital Outpatient Visit on 01/12/2020  Component Date Value Ref Range Status  . SARS Coronavirus 2 01/12/2020 NEGATIVE  NEGATIVE Final   Comment: (NOTE) SARS-CoV-2 target nucleic acids are NOT DETECTED.  The SARS-CoV-2 RNA is generally detectable in upper and lower respiratory specimens during the acute phase of  infection. Negative results do not preclude SARS-CoV-2 infection, do not rule out co-infections with other pathogens, and should not be used as the sole basis for treatment or other patient management decisions. Negative results must be combined with clinical observations, patient history, and epidemiological information. The expected result is Negative.  Fact Sheet for Patients: SugarRoll.be  Fact Sheet for Healthcare Providers: https://www.woods-mathews.com/  This test is not yet approved or cleared by the [place] FDA and  has been authorized for detection and/or diagnosis of SARS-CoV-2 by FDA under an Emergency Use Authorization (EUA). This EUA will remain  in effect (meaning this test can be used) for the duration of the COVID-19 declaration under Se                          ction 564(b)(1) of the Act, 21 U.S.C. section 360bbb-3(b)(1), unless the authorization is terminated or revoked sooner.  Performed at [facility], [place] [address], [place], [place] [address]   Hospital Outpatient Visit on 01/04/2020  Component Date Value Ref Range Status  . Sodium 01/04/2020 139  135 - 145 mmol/L Final  . Potassium 01/04/2020 4.0  3.5 - 5.1 mmol/L Final  . Chloride 01/04/2020 105  98 - 111 mmol/L Final  . CO2 01/04/2020 24  22 - 32 mmol/L Final  . Glucose, Bld 01/04/2020 96  70 - 99 mg/dL Final   Glucose reference range applies only to samples taken after fasting for at least 8 hours.  . BUN 01/04/2020 35* 8 - 23 mg/dL Final  . Creatinine, Ser 01/04/2020 1.22* 0.44 - 1.00 mg/dL Final  . Calcium 01/04/2020 9.7  8.9 - 10.3 mg/dL Final  . GFR calc non Af Amer 01/04/2020 47* >60 mL/min Final  . GFR calc Af Amer 01/04/2020 54* >60 mL/min Final  . Anion gap 01/04/2020 10  5 - 15 Final   Performed at [facility], [place] [address] [place], [place] [address]  . WBC 01/04/2020 7.3  4.0 - 10.5 K/uL Final  . RBC 01/04/2020  3.68* 3.87 - 5.11 MIL/uL Final  . Hemoglobin 01/04/2020 12.0  12.0 - 15.0 g/dL Final  . HCT 01/04/2020 37.4  36 - 46 % Final  . MCV 01/04/2020 101.6* 80.0 - 100.0 fL Final  . MCH 01/04/2020 32.6  26.0 - 34.0 pg Final  . MCHC 01/04/2020 32.1  30.0 - 36.0 g/dL Final  . RDW 01/04/2020 13.4  11.5 - 15.5 % Final  . Platelets 01/04/2020 280  150 - 400 K/uL Final  . nRBC 01/04/2020 0.0  0.0 - 0.2 % Final   Performed at [facility], [place] [address], [place], [place] [address]  .  ABO/RH(D) 01/04/2020 O POS   Final  . Antibody Screen 01/04/2020 NEG   Final  . Sample Expiration 01/04/2020    Final                   Value:01/18/2020,2359 Performed at [facility], [place] [address], [place], [place] [address]   . MRSA, PCR 01/04/2020 NEGATIVE  NEGATIVE Final  . Staphylococcus aureus 01/04/2020 NEGATIVE  NEGATIVE Final   Comment: (NOTE) The Xpert SA Assay (FDA approved for NASAL specimens in patients 25 years of age and older), is one component of a comprehensive surveillance program. It is not intended to diagnose infection nor to guide or monitor treatment. Performed at [facility], [place] [address] [place], [place] [address]      X-Rays:No results found.  EKG: Orders placed or performed during the hospital encounter of 01/04/20  . EKG 12 lead per protocol  . EKG 12 lead per protocol     Hospital Course: Caleyah Jr is a 64 y.o. who was admitted to [facility]. They were brought to the operating room on 01/16/2020 and underwent Procedure(s): RIGHT TOTAL KNEE ARTHROPLASTY KNEE INJECTION.  Patient tolerated the procedure well and was later transferred to the recovery room and then to the orthopaedic floor for postoperative care. They were given PO and IV analgesics for pain control following their surgery. They were given 24 hours of postoperative antibiotics of  Anti-infectives (From admission, onward)   Start      Dose/Rate Route Frequency Ordered Stop   01/16/20 1424  ceFAZolin (ANCEF) 2-4 GM/100ML-% IVPB       Note to Pharmacy: Bernadene Person   : cabinet override      01/16/20 1424 01/17/20 0229   01/16/20 1400  ceFAZolin (ANCEF) IVPB 2g/100 mL premix        2 g 200 mL/hr over 30 Minutes Intravenous Every 6 hours 01/16/20 1224 01/16/20 2104   01/16/20 0600  ceFAZolin (ANCEF) IVPB 2g/100 mL premix        2 g 200 mL/hr over 30 Minutes Intravenous On call to O.R. 01/16/20 0533 01/16/20 0725     and started on DVT prophylaxis in the form of Aspirin.   PT and OT were ordered for total joint protocol. Discharge planning consulted to help with postop disposition and equipment needs.  Patient had a good night on the evening of surgery. They started to get up OOB with therapy on POD #0. Pt was seen during rounds and was ready to go home pending progress with therapy. She worked with therapy on POD #1 and was meeting her goals. Pt was discharged to home later that day in stable condition.  Diet: Regular diet Activity: WBAT Follow-up: in 2 weeks Disposition: Home Discharged Condition: good   Discharge Instructions    Call MD / Call 911   Complete by: As directed    If you experience chest pain or shortness of breath, CALL 911 and be transported to the hospital emergency room.  If you develope a fever above 101 F, pus (white drainage) or increased drainage or redness at the wound, or calf pain, call your surgeon's office.   Change dressing   Complete by: As directed    Maintain surgical dressing until follow up in the clinic. If the edges start to pull up, may reinforce with tape. If the dressing is no longer working, may remove and cover with gauze and tape, but must keep the area dry  and clean.  Call with any questions or concerns.   Constipation Prevention   Complete by: As directed    Drink plenty of fluids.  Prune juice may be helpful.  You may use a stool softener, such as Colace (over the counter)  100 mg twice a day.  Use MiraLax (over the counter) for constipation as needed.   Diet - low sodium heart healthy   Complete by: As directed    Discharge instructions   Complete by: As directed    Maintain surgical dressing until follow up in the clinic. If the edges start to pull up, may reinforce with tape. If the dressing is no longer working, may remove and cover with gauze and tape, but must keep the area dry and clean.  Follow up in 2 weeks at [facility]. Call with any questions or concerns.   Increase activity slowly as tolerated   Complete by: As directed    Weight bearing as tolerated with assist device (walker, cane, etc) as directed, use it as long as suggested by your surgeon or therapist, typically at least 4-6 weeks.   TED hose   Complete by: As directed    Use stockings (TED hose) for 2 weeks on both leg(s).  You may remove them at night for sleeping.     Allergies as of 01/17/2020      Reactions   Amphetamine-dextroamphet Er Nausea And Vomiting   Cymbalta [duloxetine Hcl] Other (See Comments)   "Made me sleepy."   Erythromycin Ethylsuccinate Nausea And Vomiting   Morphine Nausea And Vomiting      Medication List    STOP taking these medications   cyclobenzaprine 5 MG tablet Commonly known as: FLEXERIL   traMADol 50 MG tablet Commonly known as: ULTRAM     TAKE these medications   acetaminophen 500 MG tablet Commonly known as: TYLENOL Take 2 tablets (1,000 mg total) by mouth every 8 (eight) hours. What changed:   how much to take  when to take this  reasons to take this   allopurinol 100 MG tablet Commonly known as: ZYLOPRIM TAKE 1 TABLET BY MOUTH EVERY DAY   ASPERCREME MAX STRENGTH EX Apply 1 application topically 3 (three) times daily as needed (pain.).   aspirin 81 MG chewable tablet Commonly known as: Aspirin Childrens Chew 1 tablet (81 mg total) by mouth 2 (two) times daily. Take for 4 weeks, then resume regular dose.   b complex vitamins  tablet Take 1 tablet by mouth daily.   colchicine 0.6 MG tablet TAKE 1 TABLET BY MOUTH EVERY DAY   cyanocobalamin 1000 MCG/ML injection Commonly known as: (VITAMIN B-12) Inject 1 mL (1,000 mcg total) into the muscle every 14 (fourteen) days.   docusate sodium 100 MG capsule Commonly known as: Colace Take 1 capsule (100 mg total) by mouth 2 (two) times daily.   ferrous sulfate 325 (65 FE) MG tablet Commonly known as: FerrouSul Take 1 tablet (325 mg total) by mouth 3 (three) times daily with meals for 14 days.   methocarbamol 500 MG tablet Commonly known as: Robaxin Take 1 tablet (500 mg total) by mouth every 6 (six) hours as needed for muscle spasms.   ondansetron 4 MG tablet Commonly known as: Zofran Take 1 tablet (4 mg total) by mouth every 8 (eight) hours as needed for nausea or vomiting.   Oxycodone HCl 10 MG Tabs Take 1 tablet (10 mg total) by mouth every 4 (four) hours as needed for moderate pain or severe  pain (post-op pain).   pantoprazole 40 MG tablet Commonly known as: PROTONIX TAKE 1 TABLET BY MOUTH EVERY DAY What changed:   when to take this  reasons to take this   phentermine 37.5 MG tablet Commonly known as: ADIPEX-P Take 1 tablet (37.5 mg total) by mouth daily before breakfast.   polyethylene glycol 17 g packet Commonly known as: MIRALAX / GLYCOLAX Take 17 g by mouth 2 (two) times daily.   promethazine 12.5 MG tablet Commonly known as: PHENERGAN Take 1 tablet (12.5 mg total) by mouth every 6 (six) hours as needed for up to 7 days for nausea or vomiting.   SYRINGE-NEEDLE (DISP) 3 ML 25G X 1" 3 ML Misc Commonly known as: BD Integra Syringe TO BE USED FOR B12 INJECTIONS   Vitamin D3 1.25 MG (50000 UT) Caps Take 1 capsule by mouth once a week.   Vitamin D3 50 MCG (2000 UT) capsule Take 1 capsule (2,000 Units total) by mouth daily.            Discharge Care Instructions  (From admission, onward)         Start     Ordered   01/16/20 0000   Change dressing       Comments: Maintain surgical dressing until follow up in the clinic. If the edges start to pull up, may reinforce with tape. If the dressing is no longer working, may remove and cover with gauze and tape, but must keep the area dry and clean.  Call with any questions or concerns.   01/16/20 0831          Follow-up Information    Paralee Cancel, MD. Schedule an appointment as soon as possible for a visit in 2 weeks.   Specialty: Orthopedic Surgery Contact information: [address] [place] [address] [phone]               Signed: Griffith Citron, PA-C Orthopedic Surgery 01/24/2020, 7:59 AM

## 2020-02-08 ENCOUNTER — Encounter: Payer: Medicare Other | Admitting: Internal Medicine

## 2020-02-21 ENCOUNTER — Telehealth: Payer: Self-pay | Admitting: Internal Medicine

## 2020-02-21 NOTE — Telephone Encounter (Signed)
LVM for pt to rtn my call to schedule awv with [facility]. Please schedule this appt if pt calls the office.

## 2020-02-29 ENCOUNTER — Encounter: Payer: Medicare Other | Admitting: Internal Medicine

## 2020-03-01 ENCOUNTER — Other Ambulatory Visit: Payer: Medicare Other

## 2020-03-01 DIAGNOSIS — Z20822 Contact with and (suspected) exposure to covid-19: Secondary | ICD-10-CM

## 2020-03-02 LAB — NOVEL CORONAVIRUS, NAA: SARS-CoV-2, NAA: DETECTED — AB

## 2020-03-02 LAB — SARS-COV-2, NAA 2 DAY TAT

## 2020-03-03 ENCOUNTER — Telehealth: Payer: Self-pay | Admitting: Nurse Practitioner

## 2020-03-03 DIAGNOSIS — U071 COVID-19: Secondary | ICD-10-CM

## 2020-03-03 NOTE — Telephone Encounter (Signed)
Called to discuss with Emily Sanchez about Covid symptoms and the use of a monoclonal antibody infusion for those with mild to moderate Covid symptoms and at a high risk of hospitalization.     Pt does not qualify for infusion therapy given asymptomatic infection. Isolation precautions discussed. Advised to contact back for consideration should she develop symptoms. Patient verbalized understanding.     Patient Active Problem List   Diagnosis Date Noted  . Obese 01/17/2020  . S/P right TKA 01/16/2020  . Right knee OA 01/16/2020  . S/P knee replacement 01/16/2020  . Preop exam for internal medicine 01/09/2020  . 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 Gout attack 08/18/2017  . Abdominal wall abscess at site of surgical wound   . Surgical site infection 06/22/2017  . SBO (small bowel obstruction) ([place]) 06/08/2017  . Fracture of multiple ribs of left side 09/03/2015  . Liver laceration 09/03/2015  . Adrenal hemorrhage ([place]) 09/03/2015  . AKI (acute kidney injury) ([place]) 09/03/2015  . Kidney laceration, right 08/31/2015  . 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 Status post total colectomy and ileostomy for Crohn's 03/21/2015  . Ventral hernia 03/21/2015  . Well adult exam 02/27/2015  . Allergic rhinitis 08/16/2014  . Enteritis due to Clostridium difficile 08/16/2012  . Nausea 08/16/2012  . Angular stomatitis 03/30/2012  . Anemia, chronic disease 04/08/2011  . CRI (chronic renal insufficiency) 04/08/2011  . Osteoarthritis 06/18/2009  . PNEUMONIA 11/06/2008  . Fibromyalgia syndrome 08/13/2008  . INSOMNIA, PERSISTENT 10/18/2007  . Chronic fatigue syndrome 10/18/2007  . Vitamin D deficiency 07/15/2007  . Anxiety state 02/17/2007  . GERD 02/17/2007  . LOW BACK PAIN 02/17/2007  . B12 deficiency 11/08/2006  . PANIC ATTACK 11/08/2006  . DEPRESSION 11/08/2006  . Essential hypertension 11/08/2006  . Crohn's disease ([place]) 11/08/2006    Beckey Rutter, NP [phone] [email or web address]

## 2020-04-06 ENCOUNTER — Other Ambulatory Visit: Payer: Self-pay | Admitting: Internal Medicine

## 2020-07-05 ENCOUNTER — Other Ambulatory Visit: Payer: Self-pay | Admitting: Internal Medicine

## 2020-07-17 ENCOUNTER — Telehealth: Payer: Self-pay | Admitting: Internal Medicine

## 2020-07-17 NOTE — Telephone Encounter (Signed)
LVM for pt to rtn my call to r/s appt with nha on 07/25/20. I also have spoken to patient 3 times to r/s this appt, and was asked to call back to r/s.

## 2020-07-25 ENCOUNTER — Ambulatory Visit: Payer: Medicare Other

## 2020-07-25 ENCOUNTER — Encounter: Payer: Self-pay | Admitting: Internal Medicine

## 2020-07-25 ENCOUNTER — Ambulatory Visit: Payer: Medicare Other | Admitting: Internal Medicine

## 2020-07-25 ENCOUNTER — Other Ambulatory Visit: Payer: Self-pay

## 2020-07-25 DIAGNOSIS — F411 Generalized anxiety disorder: Secondary | ICD-10-CM | POA: Diagnosis not present

## 2020-07-25 DIAGNOSIS — E538 Deficiency of other specified B group vitamins: Secondary | ICD-10-CM

## 2020-07-25 DIAGNOSIS — M8949 Other hypertrophic osteoarthropathy, multiple sites: Secondary | ICD-10-CM

## 2020-07-25 DIAGNOSIS — I10 Essential (primary) hypertension: Secondary | ICD-10-CM

## 2020-07-25 DIAGNOSIS — N1832 Chronic kidney disease, stage 3b: Secondary | ICD-10-CM

## 2020-07-25 DIAGNOSIS — M159 Polyosteoarthritis, unspecified: Secondary | ICD-10-CM

## 2020-07-25 DIAGNOSIS — F419 Anxiety disorder, unspecified: Secondary | ICD-10-CM

## 2020-07-25 LAB — CBC WITH DIFFERENTIAL/PLATELET
Basophils Absolute: 0.1 10*3/uL (ref 0.0–0.1)
Basophils Relative: 1.1 % (ref 0.0–3.0)
Eosinophils Absolute: 0.1 10*3/uL (ref 0.0–0.7)
Eosinophils Relative: 2.3 % (ref 0.0–5.0)
HCT: 35.5 % — ABNORMAL LOW (ref 36.0–46.0)
Hemoglobin: 11.6 g/dL — ABNORMAL LOW (ref 12.0–15.0)
Lymphocytes Relative: 35.5 % (ref 12.0–46.0)
Lymphs Abs: 2 10*3/uL (ref 0.7–4.0)
MCHC: 32.8 g/dL (ref 30.0–36.0)
MCV: 95.2 fl (ref 78.0–100.0)
Monocytes Absolute: 0.6 10*3/uL (ref 0.1–1.0)
Monocytes Relative: 9.6 % (ref 3.0–12.0)
Neutro Abs: 3 10*3/uL (ref 1.4–7.7)
Neutrophils Relative %: 51.5 % (ref 43.0–77.0)
Platelets: 233 10*3/uL (ref 150.0–400.0)
RBC: 3.73 Mil/uL — ABNORMAL LOW (ref 3.87–5.11)
RDW: 16.3 % — ABNORMAL HIGH (ref 11.5–15.5)
WBC: 5.7 10*3/uL (ref 4.0–10.5)

## 2020-07-25 LAB — COMPREHENSIVE METABOLIC PANEL
ALT: 8 U/L (ref 0–35)
AST: 14 U/L (ref 0–37)
Albumin: 4 g/dL (ref 3.5–5.2)
Alkaline Phosphatase: 89 U/L (ref 39–117)
BUN: 20 mg/dL (ref 6–23)
CO2: 24 mEq/L (ref 19–32)
Calcium: 9.5 mg/dL (ref 8.4–10.5)
Chloride: 104 mEq/L (ref 96–112)
Creatinine, Ser: 1.01 mg/dL (ref 0.40–1.20)
GFR: 58.7 mL/min — ABNORMAL LOW (ref >60.00)
Glucose, Bld: 79 mg/dL (ref 70–99)
Potassium: 4 mEq/L (ref 3.5–5.1)
Sodium: 140 mEq/L (ref 135–145)
Total Bilirubin: 0.7 mg/dL (ref 0.2–1.2)
Total Protein: 7.6 g/dL (ref 6.0–8.3)

## 2020-07-25 LAB — TSH: TSH: 0.73 u[IU]/mL (ref 0.35–4.50)

## 2020-07-25 MED ORDER — TRAMADOL HCL 50 MG PO TABS: 50.0000 mg | ORAL_TABLET | Freq: Four times a day (QID) | ORAL | 3 refills | Status: DC | PRN

## 2020-07-25 NOTE — Assessment & Plan Note (Signed)
Off Rx 

## 2020-07-25 NOTE — Progress Notes (Signed)
Subjective:  Patient ID: Emily Sanchez, female    DOB: 07/03/1955  Age: 65 y.o. MRN: [id number]  CC: Back Pain (Pt states since her husband been sick she's been having the back pain from helping him especially when he falls. She states he fell on her the other day and her back hit the back of the sofer )   HPI Tatianna Francie Keeling presents for chronic pain, fatigue and stress; Gretta Cool has been falling  Outpatient Medications Prior to Visit  Medication Sig Dispense Refill  . acetaminophen (TYLENOL) 500 MG tablet Take 2 tablets (1,000 mg total) by mouth every 8 (eight) hours. 30 tablet 0  . allopurinol (ZYLOPRIM) 100 MG tablet TAKE 1 TABLET BY MOUTH EVERY DAY 90 tablet 1  . b complex vitamins tablet Take 1 tablet by mouth daily. 100 tablet 3  . Cholecalciferol (VITAMIN D3) 1.25 MG (50000 UT) CAPS Take 1 capsule by mouth once a week. 8 capsule 0  . Cholecalciferol (VITAMIN D3) 50 MCG (2000 UT) capsule Take 1 capsule (2,000 Units total) by mouth daily. 100 capsule 3  . colchicine 0.6 MG tablet TAKE 1 TABLET BY MOUTH EVERY DAY 90 tablet 1  . cyanocobalamin (,VITAMIN B-12,) 1000 MCG/ML injection Inject 1 mL (1,000 mcg total) into the muscle every 14 (fourteen) days. 20 mL 3  . docusate sodium (COLACE) 100 MG capsule Take 1 capsule (100 mg total) by mouth 2 (two) times daily. 28 capsule 0  . Lidocaine (ASPERCREME MAX STRENGTH EX) Apply 1 application topically 3 (three) times daily as needed (pain.).    Marland Kitchen methocarbamol (ROBAXIN) 500 MG tablet Take 1 tablet (500 mg total) by mouth every 6 (six) hours as needed for muscle spasms. 40 tablet 0  . ondansetron (ZOFRAN) 4 MG tablet Take 1 tablet (4 mg total) by mouth every 8 (eight) hours as needed for nausea or vomiting. 30 tablet 1  . oxyCODONE 10 MG TABS Take 1 tablet (10 mg total) by mouth every 4 (four) hours as needed for moderate pain or severe pain (post-op pain). 42 tablet 0  . pantoprazole (PROTONIX) 40 MG tablet TAKE 1 TABLET BY MOUTH  EVERY DAY (Patient taking differently: Take 40 mg by mouth daily as needed (upset stomach/indigestion.).) 90 tablet 3  . phentermine (ADIPEX-P) 37.5 MG tablet TAKE 1 TABLET BY MOUTH DAILY BEFORE BREAKFAST 30 tablet 2  . polyethylene glycol (MIRALAX / GLYCOLAX) 17 g packet Take 17 g by mouth 2 (two) times daily. 28 packet 0  . SYRINGE-NEEDLE, DISP, 3 ML (BD INTEGRA SYRINGE) 25G X 1" 3 ML MISC TO BE USED FOR B12 INJECTIONS 50 each 3  . traMADol (ULTRAM) 50 MG tablet TAKE 1 TABLET (50 MG TOTAL) BY MOUTH EVERY 6 (SIX) HOURS AS NEEDED FOR SEVERE PAIN. 120 tablet 1  . ferrous sulfate (FERROUSUL) 325 (65 FE) MG tablet Take 1 tablet (325 mg total) by mouth 3 (three) times daily with meals for 14 days. 42 tablet 0  . promethazine (PHENERGAN) 12.5 MG tablet Take 1 tablet (12.5 mg total) by mouth every 6 (six) hours as needed for up to 7 days for nausea or vomiting. (Patient not taking: Reported on 12/29/2019) 20 tablet 1   No facility-administered medications prior to visit.    ROS: Review of Systems  Constitutional: Positive for fatigue. Negative for activity change, appetite change, chills and unexpected weight change.  HENT: Negative for congestion, mouth sores and sinus pressure.   Eyes: Negative for visual disturbance.  Respiratory: Negative for  cough and chest tightness.   Gastrointestinal: Negative for abdominal pain and nausea.  Genitourinary: Negative for difficulty urinating, frequency and vaginal pain.  Musculoskeletal: Positive for arthralgias, back pain and gait problem.  Skin: Negative for pallor and rash.  Neurological: Negative for dizziness, tremors, weakness, numbness and headaches.  Psychiatric/Behavioral: Positive for sleep disturbance. Negative for confusion and suicidal ideas. The patient is nervous/anxious.     Objective:  BP (!) 158/90 (BP Location: Left Arm)   Pulse (!) 110   Temp 98.3 F (36.8 C) (Oral)   Ht 5' 5.5" (1.664 m)   Wt 194 lb 9.6 oz (88.3 kg)   SpO2 99%    BMI 31.89 kg/m   BP Readings from Last 3 Encounters:  07/25/20 (!) 158/90  01/17/20 140/86  01/09/20 126/88    Wt Readings from Last 3 Encounters:  07/25/20 194 lb 9.6 oz (88.3 kg)  01/16/20 195 lb (88.5 kg)  01/09/20 195 lb (88.5 kg)    Physical Exam Constitutional:      General: She is not in acute distress.    Appearance: She is well-developed. She is obese.  HENT:     Head: Normocephalic.     Right Ear: External ear normal.     Left Ear: External ear normal.     Nose: Nose normal.  Eyes:     General:        Right eye: No discharge.        Left eye: No discharge.     Conjunctiva/sclera: Conjunctivae normal.     Pupils: Pupils are equal, round, and reactive to light.  Neck:     Thyroid: No thyromegaly.     Vascular: No JVD.     Trachea: No tracheal deviation.  Cardiovascular:     Rate and Rhythm: Normal rate and regular rhythm.     Heart sounds: Normal heart sounds.  Pulmonary:     Effort: No respiratory distress.     Breath sounds: No stridor. No wheezing.  Abdominal:     General: Bowel sounds are normal. There is no distension.     Palpations: Abdomen is soft. There is no mass.     Tenderness: There is no abdominal tenderness. There is no guarding or rebound.  Musculoskeletal:        General: Tenderness present.     Cervical back: Normal range of motion and neck supple.  Lymphadenopathy:     Cervical: No cervical adenopathy.  Skin:    Findings: No erythema or rash.  Neurological:     Mental Status: She is oriented to person, place, and time.     Cranial Nerves: No cranial nerve deficit.     Motor: No abnormal muscle tone.     Coordination: Coordination normal.     Gait: Gait abnormal.     Deep Tendon Reflexes: Reflexes normal.  Psychiatric:        Behavior: Behavior normal.        Thought Content: Thought content normal.        Judgment: Judgment normal.   ostomy  Lab Results  Component Value Date   WBC 8.4 01/17/2020   HGB 9.9 (L) 01/17/2020    HCT 30.2 (L) 01/17/2020   PLT 237 01/17/2020   GLUCOSE 160 (H) 01/17/2020   CHOL 172 05/27/2010   TRIG 100.0 05/27/2010   HDL 46.30 05/27/2010   LDLCALC 106 (H) 05/27/2010   ALT 7 08/09/2019   AST 14 08/09/2019   NA 138 01/17/2020   K  5.0 01/17/2020   CL 104 01/17/2020   CREATININE 1.07 (H) 01/17/2020   BUN 16 01/17/2020   CO2 26 01/17/2020   TSH 0.78 08/09/2019   INR 1.0 01/05/2018   HGBA1C 5.9 12/17/2009   MICROALBUR 0.6 02/10/2013    No results found.  Assessment & Plan:    Walker Kehr, MD

## 2020-07-25 NOTE — Assessment & Plan Note (Signed)
On B12 

## 2020-07-25 NOTE — Assessment & Plan Note (Signed)
Hydrate well BMET

## 2020-07-25 NOTE — Addendum Note (Signed)
Addended by: Boris Lown B on: 07/25/2020 02:44 PM   Modules accepted: Orders

## 2020-07-25 NOTE — Assessment & Plan Note (Signed)
Tramadol prn

## 2020-08-07 ENCOUNTER — Other Ambulatory Visit: Payer: Self-pay | Admitting: Internal Medicine

## 2020-08-26 ENCOUNTER — Telehealth: Payer: Self-pay | Admitting: Internal Medicine

## 2020-08-26 MED ORDER — METHYLPREDNISOLONE 4 MG PO TBPK: ORAL_TABLET | ORAL | 0 refills | Status: DC

## 2020-08-26 NOTE — Telephone Encounter (Signed)
Notified pt w/MD response.../lmb 

## 2020-08-26 NOTE — Telephone Encounter (Signed)
[facility] Report/Call: 08/24/20 ---Caller has gout in foot. It is swollen, red, and painful Symptoms started on Wednesday. Only had it once or twice in the past. Does not take regular meds for it. Swollen to the ankle and stops before it reaches her Toes.  Advised: * IF OFFICE WILL BE OPEN: You need to be seen within the next 3 or 4 hours. Call your doctor (or NP/PA) now or as soon as the office opens. * For pain relief, you can take either acetaminophen, ibuprofen, or naproxen. * They are over-the-counter (OTC) pain drugs. You can buy them at the drugstore. CALL BACK IF: * You become worse CARE ADVICE given per Foot Pain (Adult) guideline. * IF OFFICE WILL BE CLOSED AND NO PCP (PRIMARY CARE PROVIDER) SECOND-LEVEL TRIAGE: You need to be seen within the next 3 or 4 hours. A nearby [facility] is often a good source of care. Another choice is to go to the ED. Go sooner if you become worse.  **[place] [facility].  ---Contacted backline number for virtual appt and error msg rec'd each time. Caller declined EMS, and stated she would be uncomfortable wtg in ED and husband has Parkinson's. Caller states was unable to go to ED d/t unable to walk, declined needing 911 and also advised husband has Parkinsons Disease. Caller wants a virtual visits. 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

## 2020-08-26 NOTE — Telephone Encounter (Signed)
Ok medrol pack - done Thx

## 2021-01-09 DIAGNOSIS — M25561 Pain in right knee: Secondary | ICD-10-CM | POA: Diagnosis not present

## 2021-01-09 DIAGNOSIS — M1712 Unilateral primary osteoarthritis, left knee: Secondary | ICD-10-CM | POA: Diagnosis not present

## 2021-01-31 ENCOUNTER — Other Ambulatory Visit: Payer: Self-pay | Admitting: Internal Medicine

## 2021-02-05 ENCOUNTER — Other Ambulatory Visit: Payer: Self-pay | Admitting: Internal Medicine

## 2021-02-11 ENCOUNTER — Other Ambulatory Visit: Payer: Self-pay | Admitting: Internal Medicine

## 2021-02-27 ENCOUNTER — Telehealth: Payer: Self-pay | Admitting: Internal Medicine

## 2021-02-27 NOTE — Telephone Encounter (Signed)
Pt. States she went to pick up medication traMADol (ULTRAM) 50 MG tablet. States that pharmacy told her they need PA before prescription can be refilled. States she would only be given a 7 day supply. Reports hardly being able to walk.    [id number]  [phone] Fax:  [phone]     Callback #- [phone]

## 2021-02-28 NOTE — Telephone Encounter (Signed)
PA submitted via cover-my-meds w/ (Key: [id number]). Waiting on insurance determination.Marland KitchenJohny Chess

## 2021-03-02 LAB — HM HEPATITIS C SCREENING LAB: HM Hepatitis Screen: NEGATIVE

## 2021-03-03 NOTE — Telephone Encounter (Signed)
Emily Sanchez has been on tramadol for many years for her chronic pain/low back pain/knee arthritis.  It has to be ongoing.  Thanks

## 2021-03-03 NOTE — Telephone Encounter (Signed)
Rec'd determination back it states " Tramadol 50 mg is denied because the information provided is not sufficient to support approval for medical necessity. The requested drug is subject to a 7 day supply limit exception. Your plan allows a 7 day supply if you have never taken opioid medication before.Marland KitchenJohny Sanchez

## 2021-03-12 ENCOUNTER — Encounter: Payer: Self-pay | Admitting: Internal Medicine

## 2021-04-09 DIAGNOSIS — M1712 Unilateral primary osteoarthritis, left knee: Secondary | ICD-10-CM | POA: Diagnosis not present

## 2021-05-12 ENCOUNTER — Ambulatory Visit (INDEPENDENT_AMBULATORY_CARE_PROVIDER_SITE_OTHER): Payer: Medicare Other | Admitting: Internal Medicine

## 2021-05-12 ENCOUNTER — Other Ambulatory Visit: Payer: Self-pay

## 2021-05-12 ENCOUNTER — Encounter: Payer: Self-pay | Admitting: Internal Medicine

## 2021-05-12 VITALS — BP 140/92 | HR 88 | Temp 98.0°F | Ht 65.5 in | Wt 199.0 lb

## 2021-05-12 DIAGNOSIS — M5440 Lumbago with sciatica, unspecified side: Secondary | ICD-10-CM | POA: Diagnosis not present

## 2021-05-12 DIAGNOSIS — K50118 Crohn's disease of large intestine with other complication: Secondary | ICD-10-CM | POA: Diagnosis not present

## 2021-05-12 DIAGNOSIS — E538 Deficiency of other specified B group vitamins: Secondary | ICD-10-CM

## 2021-05-12 DIAGNOSIS — E559 Vitamin D deficiency, unspecified: Secondary | ICD-10-CM

## 2021-05-12 DIAGNOSIS — N1832 Chronic kidney disease, stage 3b: Secondary | ICD-10-CM | POA: Diagnosis not present

## 2021-05-12 DIAGNOSIS — G8929 Other chronic pain: Secondary | ICD-10-CM

## 2021-05-12 LAB — CBC WITH DIFFERENTIAL/PLATELET
Basophils Absolute: 0.1 10*3/uL (ref 0.0–0.1)
Basophils Relative: 1 % (ref 0.0–3.0)
Eosinophils Absolute: 0.1 10*3/uL (ref 0.0–0.7)
Eosinophils Relative: 2 % (ref 0.0–5.0)
HCT: 37.9 % (ref 36.0–46.0)
Hemoglobin: 12.2 g/dL (ref 12.0–15.0)
Lymphocytes Relative: 40.8 % (ref 12.0–46.0)
Lymphs Abs: 2.7 10*3/uL (ref 0.7–4.0)
MCHC: 32.3 g/dL (ref 30.0–36.0)
MCV: 97 fl (ref 78.0–100.0)
Monocytes Absolute: 0.5 10*3/uL (ref 0.1–1.0)
Monocytes Relative: 7.4 % (ref 3.0–12.0)
Neutro Abs: 3.2 10*3/uL (ref 1.4–7.7)
Neutrophils Relative %: 48.8 % (ref 43.0–77.0)
Platelets: 230 10*3/uL (ref 150.0–400.0)
RBC: 3.9 Mil/uL (ref 3.87–5.11)
RDW: 15.7 % — ABNORMAL HIGH (ref 11.5–15.5)
WBC: 6.6 10*3/uL (ref 4.0–10.5)

## 2021-05-12 LAB — VITAMIN B12: Vitamin B-12: 213 pg/mL (ref 211–911)

## 2021-05-12 LAB — VITAMIN D 25 HYDROXY (VIT D DEFICIENCY, FRACTURES): VITD: 18.34 ng/mL — ABNORMAL LOW (ref 30.00–100.00)

## 2021-05-12 LAB — TSH: TSH: 1.03 u[IU]/mL (ref 0.35–5.50)

## 2021-05-12 MED ORDER — PANTOPRAZOLE SODIUM 40 MG PO TBEC: 40.0000 mg | DELAYED_RELEASE_TABLET | Freq: Every day | ORAL | 3 refills | Status: DC

## 2021-05-12 MED ORDER — TRAMADOL HCL 50 MG PO TABS: 50.0000 mg | ORAL_TABLET | Freq: Four times a day (QID) | ORAL | 3 refills | Status: DC | PRN

## 2021-05-12 MED ORDER — CYCLOBENZAPRINE HCL 5 MG PO TABS: ORAL_TABLET | ORAL | 3 refills | Status: DC

## 2021-05-12 NOTE — Assessment & Plan Note (Signed)
Check CMET. 

## 2021-05-12 NOTE — Progress Notes (Signed)
Subjective:  Patient ID: Emily Sanchez, female    DOB: [birth year]  Age: 66 y.o. MRN: [id number]  CC: Insomnia (Pt states since her husband passed she has not been able to sleep. Back & Knee pain.. she is schedule for knee surgery soon) y  HPI Emily Sanchez presents for grief, LBP, OA Woody died in 2021-01-18  Outpatient Medications Prior to Visit  Medication Sig Dispense Refill   acetaminophen (TYLENOL) 500 MG tablet Take 2 tablets (1,000 mg total) by mouth every 8 (eight) hours. 30 tablet 0   allopurinol (ZYLOPRIM) 100 MG tablet TAKE 1 TABLET BY MOUTH EVERY DAY 90 tablet 1   Cholecalciferol (VITAMIN D3) 50 MCG (2000 UT) capsule Take 1 capsule (2,000 Units total) by mouth daily. 100 capsule 3   colchicine 0.6 MG tablet TAKE 1 TABLET BY MOUTH EVERY DAY 90 tablet 1   cyanocobalamin (,VITAMIN B-12,) 1000 MCG/ML injection Inject 1 mL (1,000 mcg total) into the muscle every 14 (fourteen) days. 20 mL 3   ondansetron (ZOFRAN) 4 MG tablet Take 1 tablet (4 mg total) by mouth every 8 (eight) hours as needed for nausea or vomiting. 30 tablet 1   phentermine (ADIPEX-P) 37.5 MG tablet TAKE 1 TABLET BY MOUTH EVERY DAY BEFORE BREAKFAST 30 tablet 2   SYRINGE-NEEDLE, DISP, 3 ML (BD INTEGRA SYRINGE) 25G X 1" 3 ML MISC TO BE USED FOR B12 INJECTIONS 50 each 3   cyclobenzaprine (FLEXERIL) 5 MG tablet TAKE 1 TABLET BY MOUTH EVERYDAY AT BEDTIME Follow-up appt is due must see provider for future refills 30 tablet 0   pantoprazole (PROTONIX) 40 MG tablet TAKE 1 TABLET BY MOUTH EVERY DAY (Patient taking differently: Take 40 mg by mouth daily as needed (upset stomach/indigestion.).) 90 tablet 3   traMADol (ULTRAM) 50 MG tablet TAKE 1 TABLET BY MOUTH EVERY 6 HOURS AS NEEDED FOR SEVERE PAIN. 120 tablet 3   b complex vitamins tablet Take 1 tablet by mouth daily. (Patient not taking: Reported on 05/12/2021) 100 tablet 3   Cholecalciferol (VITAMIN D3) 1.25 MG (50000 UT) CAPS Take 1 capsule by mouth once a  week. (Patient not taking: Reported on 05/12/2021) 8 capsule 0   docusate sodium (COLACE) 100 MG capsule Take 1 capsule (100 mg total) by mouth 2 (two) times daily. (Patient not taking: Reported on 05/12/2021) 28 capsule 0   ferrous sulfate (FERROUSUL) 325 (65 FE) MG tablet Take 1 tablet (325 mg total) by mouth 3 (three) times daily with meals for 14 days. 42 tablet 0   Lidocaine (ASPERCREME MAX STRENGTH EX) Apply 1 application topically 3 (three) times daily as needed (pain.). (Patient not taking: Reported on 05/12/2021)     methocarbamol (ROBAXIN) 500 MG tablet Take 1 tablet (500 mg total) by mouth every 6 (six) hours as needed for muscle spasms. (Patient not taking: Reported on 05/12/2021) 40 tablet 0   methylPREDNISolone (MEDROL DOSEPAK) 4 MG TBPK tablet As directed (Patient not taking: Reported on 05/12/2021) 21 tablet 0   polyethylene glycol (MIRALAX / GLYCOLAX) 17 g packet Take 17 g by mouth 2 (two) times daily. (Patient not taking: Reported on 05/12/2021) 28 packet 0   No facility-administered medications prior to visit.    ROS: Review of Systems  Constitutional:  Negative for activity change, appetite change, chills, fatigue and unexpected weight change.  HENT:  Negative for congestion, mouth sores and sinus pressure.   Eyes:  Negative for visual disturbance.  Respiratory:  Negative for cough and chest tightness.  Gastrointestinal:  Negative for abdominal pain and nausea.  Genitourinary:  Negative for difficulty urinating, frequency and vaginal pain.  Musculoskeletal:  Positive for arthralgias and back pain. Negative for gait problem.  Skin:  Negative for pallor and rash.  Neurological:  Negative for dizziness, tremors, weakness, numbness and headaches.  Psychiatric/Behavioral:  Negative for confusion, sleep disturbance and suicidal ideas.    Objective:  BP (!) 140/92 (BP Location: Left Arm)    Pulse 88    Temp 98 F (36.7 C) (Oral)    Ht 5' 5.5" (1.664 m)    Wt 199 lb (90.3 kg)    SpO2  95%    BMI 32.61 kg/m   BP Readings from Last 3 Encounters:  05/12/21 (!) 140/92  07/25/20 (!) 158/90  01/17/20 140/86    Wt Readings from Last 3 Encounters:  05/12/21 199 lb (90.3 kg)  07/25/20 194 lb 9.6 oz (88.3 kg)  01/16/20 195 lb (88.5 kg)    Physical Exam Constitutional:      General: She is not in acute distress.    Appearance: She is well-developed.  HENT:     Head: Normocephalic.     Right Ear: External ear normal.     Left Ear: External ear normal.     Nose: Nose normal.  Eyes:     General:        Right eye: No discharge.        Left eye: No discharge.     Conjunctiva/sclera: Conjunctivae normal.     Pupils: Pupils are equal, round, and reactive to light.  Neck:     Thyroid: No thyromegaly.     Vascular: No JVD.     Trachea: No tracheal deviation.  Cardiovascular:     Rate and Rhythm: Normal rate and regular rhythm.     Heart sounds: Normal heart sounds.  Pulmonary:     Effort: No respiratory distress.     Breath sounds: No stridor. No wheezing.  Abdominal:     General: Bowel sounds are normal. There is no distension.     Palpations: Abdomen is soft. There is no mass.     Tenderness: There is no abdominal tenderness. There is no guarding or rebound.  Musculoskeletal:        General: Tenderness present.     Cervical back: Normal range of motion and neck supple. No rigidity.  Lymphadenopathy:     Cervical: No cervical adenopathy.  Skin:    Findings: No erythema or rash.  Neurological:     Mental Status: She is oriented to person, place, and time.     Cranial Nerves: No cranial nerve deficit.     Motor: No abnormal muscle tone.     Coordination: Coordination normal.     Deep Tendon Reflexes: Reflexes normal.  Psychiatric:        Behavior: Behavior normal.        Thought Content: Thought content normal.        Judgment: Judgment normal.  Knees w/pain  Lab Results  Component Value Date   WBC 5.7 07/25/2020   HGB 11.6 (L) 07/25/2020   HCT 35.5  (L) 07/25/2020   PLT 233.0 07/25/2020   GLUCOSE 79 07/25/2020   CHOL 172 05/27/2010   TRIG 100.0 05/27/2010   HDL 46.30 05/27/2010   LDLCALC 106 (H) 05/27/2010   ALT 8 07/25/2020   AST 14 07/25/2020   NA 140 07/25/2020   K 4.0 07/25/2020   CL 104 07/25/2020   CREATININE  1.01 07/25/2020   BUN 20 07/25/2020   CO2 24 07/25/2020   TSH 0.73 07/25/2020   INR 1.0 01/05/2018   HGBA1C 5.9 12/17/2009   MICROALBUR 0.6 02/10/2013    No results found.  Assessment & Plan:   Problem List Items Addressed This Visit     B12 deficiency - Primary   Relevant Orders   Vitamin B12   Crohn's disease ([place])   Relevant Orders   CBC with Differential/Platelet   Comprehensive metabolic panel   TSH   Vitamin B12   VITAMIN D 25 Hydroxy (Vit-D Deficiency, Fractures)   Vitamin D deficiency   Relevant Orders   VITAMIN D 25 Hydroxy (Vit-D Deficiency, Fractures)      Meds ordered this encounter  Medications   cyclobenzaprine (FLEXERIL) 5 MG tablet    Sig: TAKE 1 TABLET BY MOUTH EVERYDAY AT BEDTIME Follow-up appt is due must see provider for future refills    Dispense:  30 tablet    Refill:  3   pantoprazole (PROTONIX) 40 MG tablet    Sig: Take 1 tablet (40 mg total) by mouth daily.    Dispense:  90 tablet    Refill:  3   traMADol (ULTRAM) 50 MG tablet    Sig: Take 1 tablet (50 mg total) by mouth every 6 (six) hours as needed for severe pain.    Dispense:  120 tablet    Refill:  3    This request is for a new prescription for a controlled substance as required by Federal/State law.      Follow-up: Return in about 6 weeks (around 06/23/2021) for a follow-up visit.  Walker Kehr, MD

## 2021-05-12 NOTE — Assessment & Plan Note (Signed)
On Tramadol 50-100 mg prn   Potential benefits of a long term opioids use as well as potential risks (i.e. addiction risk, apnea etc) and complications (i.e. Somnolence, constipation and others) were explained to the patient and were aknowledged.

## 2021-05-13 LAB — COMPREHENSIVE METABOLIC PANEL
ALT: 8 U/L (ref 0–35)
AST: 15 U/L (ref 0–37)
Albumin: 3.8 g/dL (ref 3.5–5.2)
Alkaline Phosphatase: 77 U/L (ref 39–117)
BUN: 26 mg/dL — ABNORMAL HIGH (ref 6–23)
CO2: 22 mEq/L (ref 19–32)
Calcium: 9.6 mg/dL (ref 8.4–10.5)
Chloride: 104 mEq/L (ref 96–112)
Creatinine, Ser: 1.12 mg/dL (ref 0.40–1.20)
GFR: 51.56 mL/min — ABNORMAL LOW (ref >60.00)
Glucose, Bld: 80 mg/dL (ref 70–99)
Potassium: 4.2 mEq/L (ref 3.5–5.1)
Sodium: 138 mEq/L (ref 135–145)
Total Bilirubin: 0.3 mg/dL (ref 0.2–1.2)
Total Protein: 7.8 g/dL (ref 6.0–8.3)

## 2021-05-14 MED ORDER — CYANOCOBALAMIN 1000 MCG/ML IJ SOLN: 1000.0000 ug | INTRAMUSCULAR | 3 refills | Status: DC

## 2021-05-14 MED ORDER — "BD INTEGRA SYRINGE 25G X 1"" 3 ML MISC": 3 refills | Status: DC

## 2021-05-14 MED ORDER — VITAMIN D (ERGOCALCIFEROL) 1.25 MG (50000 UNIT) PO CAPS: 50000.0000 [IU] | ORAL_CAPSULE | ORAL | 0 refills | Status: DC

## 2021-05-14 MED ORDER — VITAMIN D3 50 MCG (2000 UT) PO CAPS: 2000.0000 [IU] | ORAL_CAPSULE | Freq: Every day | ORAL | 3 refills | Status: DC

## 2021-05-14 NOTE — Addendum Note (Signed)
Addended by: Cassandria Anger on: 05/14/2021 12:09 PM   Modules accepted: Orders

## 2021-05-14 NOTE — Assessment & Plan Note (Signed)
Worse -  start Vit D prescription 50000 iu weekly followed by over-the-counter Vit D 2000 iu daily.

## 2021-05-14 NOTE — Assessment & Plan Note (Signed)
Re- start Vit B12

## 2021-05-22 DIAGNOSIS — Z432 Encounter for attention to ileostomy: Secondary | ICD-10-CM | POA: Diagnosis not present

## 2021-06-14 ENCOUNTER — Other Ambulatory Visit: Payer: Self-pay | Admitting: Internal Medicine

## 2021-06-27 NOTE — Patient Instructions (Signed)
DUE TO COVID-19 ONLY ONE VISITOR  (aged 66 and older)  IS ALLOWED TO COME WITH YOU AND STAY IN THE WAITING ROOM ONLY DURING PRE OP AND PROCEDURE.   **NO VISITORS ARE ALLOWED IN THE SHORT STAY AREA OR RECOVERY ROOM!!**  IF YOU WILL BE ADMITTED INTO THE HOSPITAL YOU ARE ALLOWED ONLY TWO SUPPORT PEOPLE DURING VISITATION HOURS ONLY (7 AM -8PM)   The support person(s) must pass our screening, gel in and out, and wear a mask at all times, including in the patients room. Patients must also wear a mask when staff or their support person are in the room. Visitors GUEST BADGE MUST BE WORN VISIBLY  One adult visitor may remain with you overnight and MUST be in the room by 8 P.M.      COVID SWAB TESTING MUST BE COMPLETED ON:  07/11/21 at 12:15 PM    (*ARRIVE AT YOUR APPOINTMENT TIME STAFF IS NOT HERE BEFORE 8AM!!!*)    Site: [facility] [address] [place] Enter: Main Entrance have a seat in the waiting area to the right of main entrance (DO NOT Abanda!!!!!) Dial: [phone] to alert staff you have arrived  You are not required to quarantine, however you are required to wear a well-fitted mask when you are out and around people not in your household.  Hand Hygiene often Do NOT share personal items Notify your provider if you are in close contact with someone who has COVID or you develop fever 100.4 or greater, new onset of sneezing, cough, sore throat, shortness of breath or body aches.  [address], [address], must go inside of the hospital, NOT A DRIVE THRU!  (Must self quarantine after testing. Follow instructions on handout.)         Your procedure is scheduled on: 07/15/21   Report to [facility] Main Entrance    Report to short stay at 5:55 AM   Call this number if you have problems the morning of surgery [phone]   Do not eat food :After Midnight.   After Midnight  you may have the following liquids until _5:30_____ AM DAY OF SURGERY  Water Black Coffee (sugar ok, NO MILK/CREAM OR CREAMERS)  Tea (sugar ok, NO MILK/CREAM OR CREAMERS) regular and decaf                             Plain Jell-O (NO RED)                                           Fruit ices (not with fruit pulp, NO RED)                                     Popsicles (NO RED)                                                                  Juice: apple, WHITE grape, WHITE cranberry Sports drinks like Gatorade (  NO RED) Clear broth(vegetable,chicken,beef)                     The day of surgery:  Drink ONE (1) Pre-Surgery Clear Ensure  at 5:15 AM the morning of surgery. Drink in one sitting. Do not sip.  This drink was given to you during your hospital  pre-op appointment visit. Nothing else to drink after completing the  Pre-Surgery Clear Ensure at 5:30 AM          If you have questions, please contact your surgeons office.       Oral Hygiene is also important to reduce your risk of infection.                                    Remember - BRUSH YOUR TEETH THE MORNING OF SURGERY WITH YOUR REGULAR TOOTHPASTE   Do NOT smoke after Midnight   Take these medicines the morning of surgery with A SIP OF WATER: Allopurinol, Colchicine, Pantoprazole                                You may not have any metal on your body including hair pins, jewelry, and body piercing             Do not wear make-up, lotions, powders, perfumes/cologne, or deodorant  Do not wear nail polish including gel and S&S, artificial/acrylic nails, or any other type of covering on natural nails including finger and toenails. If you have artificial nails, gel coating, etc. that needs to be removed by a nail salon please have this removed prior to surgery or surgery may need to be canceled/ delayed if the surgeon/ anesthesia feels like they are unable to be safely monitored.   Do not shave  48 hours prior to surgery.      Do not bring valuables to the hospital. [place].   Contacts, dentures or bridgework may not be worn into surgery.   Bring small overnight bag day of surgery.    Patients discharged on the day of surgery will not be allowed to drive home.  Someone NEEDS to stay with you for the first 24 hours after anesthesia.   Special Instructions: Bring a copy of your healthcare power of attorney and living will documents the day of surgery if you haven't scanned them before.              Please read over the following fact sheets you were given: IF YOU HAVE QUESTIONS ABOUT YOUR PRE-OP INSTRUCTIONS PLEASE CALL [phone]      [facility] - Preparing for Surgery Before surgery, you can play an important role.  Because skin is not sterile, your skin needs to be as free of germs as possible.  You can reduce the number of germs on your skin by washing with CHG (chlorahexidine gluconate) soap before surgery.  CHG is an antiseptic cleaner which kills germs and bonds with the skin to continue killing germs even after washing. Please DO NOT use if you have an allergy to CHG or antibacterial soaps.  If your skin becomes reddened/irritated stop using the CHG and inform your nurse when you arrive at Short Stay. Do not shave (including legs and underarms) for at  least 48 hours prior to the first CHG shower.  You may shave your face/neck. Please follow these instructions carefully:  1.  Shower with CHG Soap the night before surgery and the  morning of Surgery.  2.  If you choose to wash your hair, wash your hair first as usual with your  normal  shampoo.  3.  After you shampoo, rinse your hair and body thoroughly to remove the  shampoo.                            4.  Use CHG as you would any other liquid soap.  You can apply chg directly  to the skin and wash                       Gently with a scrungie or clean washcloth.  5.  Apply the CHG Soap to your body  ONLY FROM THE NECK DOWN.   Do not use on face/ open                           Wound or open sores. Avoid contact with eyes, ears mouth and genitals (private parts).                       Wash face,  Genitals (private parts) with your normal soap.             6.  Wash thoroughly, paying special attention to the area where your surgery  will be performed.  7.  Thoroughly rinse your body with warm water from the neck down.  8.  DO NOT shower/wash with your normal soap after using and rinsing off  the CHG Soap.                9.  Pat yourself dry with a clean towel.            10.  Wear clean pajamas.            11.  Place clean sheets on your bed the night of your first shower and do not  sleep with pets. Day of Surgery : Do not apply any lotions/deodorants the morning of surgery.  Please wear clean clothes to the hospital/surgery center.  FAILURE TO FOLLOW THESE INSTRUCTIONS MAY RESULT IN THE CANCELLATION OF YOUR SURGERY PATIENT SIGNATURE_________________________________  NURSE SIGNATURE__________________________________  ________________________________________________________________________   Adam Phenix  An incentive spirometer is a tool that can help keep your lungs clear and active. This tool measures how well you are filling your lungs with each breath. Taking long deep breaths may help reverse or decrease the chance of developing breathing (pulmonary) problems (especially infection) following: A long period of time when you are unable to move or be active. BEFORE THE PROCEDURE  If the spirometer includes an indicator to show your best effort, your nurse or respiratory therapist will set it to a desired goal. If possible, sit up straight or lean slightly forward. Try not to slouch. Hold the incentive spirometer in an upright position. INSTRUCTIONS FOR USE  Sit on the edge of your bed if possible, or sit up as far as you can in bed or on a chair. Hold the incentive  spirometer in an upright position. Breathe out normally. Place the mouthpiece in your mouth and seal your lips tightly around it. Breathe in slowly and as deeply  as possible, raising the piston or the ball toward the top of the column. Hold your breath for 3-5 seconds or for as long as possible. Allow the piston or ball to fall to the bottom of the column. Remove the mouthpiece from your mouth and breathe out normally. Rest for a few seconds and repeat Steps 1 through 7 at least 10 times every 1-2 hours when you are awake. Take your time and take a few normal breaths between deep breaths. The spirometer may include an indicator to show your best effort. Use the indicator as a goal to work toward during each repetition. After each set of 10 deep breaths, practice coughing to be sure your lungs are clear. If you have an incision (the cut made at the time of surgery), support your incision when coughing by placing a pillow or rolled up towels firmly against it. Once you are able to get out of bed, walk around indoors and cough well. You may stop using the incentive spirometer when instructed by your caregiver.  RISKS AND COMPLICATIONS Take your time so you do not get dizzy or light-headed. If you are in pain, you may need to take or ask for pain medication before doing incentive spirometry. It is harder to take a deep breath if you are having pain. AFTER USE Rest and breathe slowly and easily. It can be helpful to keep track of a log of your progress. Your caregiver can provide you with a simple table to help with this. If you are using the spirometer at home, follow these instructions: [place] IF:  You are having difficultly using the spirometer. You have trouble using the spirometer as often as instructed. Your pain medication is not giving enough relief while using the spirometer. You develop fever of 100.5 F (38.1 C) or higher. SEEK IMMEDIATE MEDICAL CARE IF:  You cough up bloody  sputum that had not been present before. You develop fever of 102 F (38.9 C) or greater. You develop worsening pain at or near the incision site. MAKE SURE YOU:  Understand these instructions. Will watch your condition. Will get help right away if you are not doing well or get worse. Document Released: 08/17/2006 Document Revised: 06/29/2011 Document Reviewed: 10/18/2006 [facility] Patient Information 2014 [place], [place].   ________________________________________________________________________

## 2021-07-08 ENCOUNTER — Encounter (HOSPITAL_COMMUNITY)
Admission: RE | Admit: 2021-07-08 | Discharge: 2021-07-08 | Disposition: A | Discharge status: Home / Self Care | Payer: Medicare Other | Source: Ambulatory Visit | Attending: Orthopedic Surgery | Admitting: Orthopedic Surgery

## 2021-07-08 ENCOUNTER — Other Ambulatory Visit: Payer: Self-pay

## 2021-07-08 ENCOUNTER — Encounter (HOSPITAL_COMMUNITY): Payer: Self-pay

## 2021-07-08 VITALS — BP 143/88 | HR 84 | Temp 98.5°F | Resp 16 | Ht 65.0 in | Wt 198.0 lb

## 2021-07-08 DIAGNOSIS — Z01818 Encounter for other preprocedural examination: Secondary | ICD-10-CM | POA: Insufficient documentation

## 2021-07-08 DIAGNOSIS — M1712 Unilateral primary osteoarthritis, left knee: Secondary | ICD-10-CM | POA: Insufficient documentation

## 2021-07-08 LAB — CBC
HCT: 37 % (ref 36.0–46.0)
Hemoglobin: 11.9 g/dL — ABNORMAL LOW (ref 12.0–15.0)
MCH: 31.2 pg (ref 26.0–34.0)
MCHC: 32.2 g/dL (ref 30.0–36.0)
MCV: 97.1 fL (ref 80.0–100.0)
Platelets: 214 10*3/uL (ref 150–400)
RBC: 3.81 MIL/uL — ABNORMAL LOW (ref 3.87–5.11)
RDW: 15.1 % (ref 11.5–15.5)
WBC: 5.2 10*3/uL (ref 4.0–10.5)
nRBC: 0 % (ref 0.0–0.2)

## 2021-07-08 LAB — COMPREHENSIVE METABOLIC PANEL
ALT: 11 U/L (ref 0–44)
AST: 17 U/L (ref 15–41)
Albumin: 3.3 g/dL — ABNORMAL LOW (ref 3.5–5.0)
Alkaline Phosphatase: 77 U/L (ref 38–126)
Anion gap: 8 (ref 5–15)
BUN: 24 mg/dL — ABNORMAL HIGH (ref 8–23)
CO2: 24 mmol/L (ref 22–32)
Calcium: 8.8 mg/dL — ABNORMAL LOW (ref 8.9–10.3)
Chloride: 105 mmol/L (ref 98–111)
Creatinine, Ser: 1.16 mg/dL — ABNORMAL HIGH (ref 0.44–1.00)
GFR, Estimated: 52 mL/min — ABNORMAL LOW (ref >60)
Glucose, Bld: 92 mg/dL (ref 70–99)
Potassium: 3.8 mmol/L (ref 3.5–5.1)
Sodium: 137 mmol/L (ref 135–145)
Total Bilirubin: 0.4 mg/dL (ref 0.3–1.2)
Total Protein: 7.5 g/dL (ref 6.5–8.1)

## 2021-07-08 LAB — SURGICAL PCR SCREEN
MRSA, PCR: NEGATIVE
Staphylococcus aureus: NEGATIVE

## 2021-07-08 NOTE — Progress Notes (Signed)
?  Bowel prep reminder:NA ? ?PCP - Dr. Loni Muse. plotnikov ?Cardiologist - NA ? ?Chest x-ray - no ?EKG - 07/08/21-chart ?Stress Test - no ?ECHO - no ?Cardiac Cath - no ?Pacemaker/ICD device last checked:NA ? ?Sleep Study - no ?CPAP -  ? ?Fasting Blood Sugar - NA ?Checks Blood Sugar _____ times a day ? ?Blood Thinner Instructions:NA ?Aspirin Instructions: ?Last Dose: ? ?Anesthesia review: yes ? ?Patient denies shortness of breath, fever, cough and chest pain at PAT appointment ?Pt lost weight from gastric sleeve and went off her BP medication. BP was 166/102 on upper arm. 148/88 on Lt lower arm. She has a lot of skin on the upper arm.I told her to monitor her pressure at home and call PCP if still up. ? ?Patient verbalized understanding of instructions that were given to them at the PAT appointment. Patient was also instructed that they will need to review over the PAT instructions again at home before surgery. yes ?

## 2021-07-10 NOTE — H&P (Signed)
TOTAL KNEE ADMISSION H&P ? ?Patient is being admitted for left total knee arthroplasty. ? ?Subjective: ? ?Chief Complaint:left knee pain. ? ?HPI: Emily Sanchez, 66 y.o. female, has a history of pain and functional disability in the left knee due to arthritis and has failed non-surgical conservative treatments for greater than 12 weeks to includeNSAID's and/or analgesics, corticosteriod injections, and activity modification.  Onset of symptoms was gradual, starting 2 years ago with gradually worsening course since that time. The patient noted no past surgery on the left knee(s).  Patient currently rates pain in the left knee(s) at 7 out of 10 with activity. Patient has worsening of pain with activity and weight bearing, pain that interferes with activities of daily living, and pain with passive range of motion.  Patient has evidence of joint space narrowing by imaging studies. There is no active infection. ? ?Patient Active Problem List  ? Diagnosis Date Noted  ? Obese 01/17/2020  ? S/P right TKA 01/16/2020  ? Right knee OA 01/16/2020  ? S/P knee replacement 01/16/2020  ? Preop exam for internal medicine 01/09/2020  ? 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 Gout attack 08/18/2017  ? Abdominal wall abscess at site of surgical wound   ? Surgical site infection 06/22/2017  ? SBO (small bowel obstruction) ([place]) 06/08/2017  ? Fracture of multiple ribs of left side 09/03/2015  ? Liver laceration 09/03/2015  ? Adrenal hemorrhage ([place]) 09/03/2015  ? AKI (acute kidney injury) ([place]) 09/03/2015  ? Kidney laceration, right 08/31/2015  ? 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 Status post total colectomy and ileostomy for Crohn's 03/21/2015  ? Ventral hernia 03/21/2015  ? Well adult exam 02/27/2015  ? Allergic rhinitis 08/16/2014  ? Enteritis due to Clostridium difficile 08/16/2012  ? Nausea 08/16/2012  ? Angular stomatitis 03/30/2012  ? Anemia, chronic disease 04/08/2011  ? CRI  (chronic renal insufficiency) 04/08/2011  ? Osteoarthritis 06/18/2009  ? PNEUMONIA 11/06/2008  ? Fibromyalgia syndrome 08/13/2008  ? INSOMNIA, PERSISTENT 10/18/2007  ? Chronic fatigue syndrome 10/18/2007  ? Vitamin D deficiency 07/15/2007  ? Anxiety disorder 02/17/2007  ? GERD 02/17/2007  ? LOW BACK PAIN 02/17/2007  ? B12 deficiency 11/08/2006  ? PANIC ATTACK 11/08/2006  ? DEPRESSION 11/08/2006  ? Essential hypertension 11/08/2006  ? Crohn's disease ([place]) 11/08/2006  ? ?Past Medical History:  ?Diagnosis Date  ? Anemia   ? Anxiety   ? Chronic kidney disease   ? stage 3 - [facility]   ? Crohn's disease ([place])   ? Depression   ? Enteritis due to Clostridium difficile 08/16/2012  ? 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 GERD (gastroesophageal reflux disease)   ? HTN (hypertension)   ? lst blood pressure medication was 3 months ago   ? Ileostomy in place [facility])   ? Low back pain   ? FMS  ? Osteoarthritis   ? Dr Alvan Dame; both knees  ? Perianal pain   ? Chronic, Post-Op  ? Transfusion history   ? none recent  ? Ulcerative colitis ([place])   ? Dr Sharlett Iles  ?  ?Past Surgical History:  ?Procedure Laterality Date  ? BREATH TEK H PYLORI N/A 12/05/2013  ? Procedure: BREATH TEK H PYLORI;  Surgeon: Pedro Earls, MD;  Location: [facility];  Service: General;  Laterality: N/A;  ? COLECTOMY  04/21/1999  ? ulcerative colitis  ? ILEOSTOMY  2011  ? INJECTION KNEE Left 01/16/2020  ? Procedure: KNEE INJECTION;  Surgeon: Paralee Cancel,  MD;  Location: [facility];  Service: Orthopedics;  Laterality: Left;  ? LAPAROSCOPIC GASTRIC SLEEVE RESECTION WITH HIATAL HERNIA REPAIR N/A 04/02/2015  ? Procedure: LAPAROSCOPIC LYSIS OF ADHESIONS GASTRIC SLEEVE RESECTION WITH OPEN VENTRAL HERNIA REPAIR;  Surgeon: Johnathan Hausen, MD;  Location: [facility];  Service: General;  Laterality: N/A;  ? LAPAROSCOPIC LYSIS OF ADHESIONS  04/02/2015  ? Procedure: LAPAROSCOPIC LYSIS OF ADHESIONS;  Surgeon: Johnathan Hausen, MD;  Location: [facility];  Service: General;;  ?  LAPAROTOMY N/A 06/11/2017  ? Procedure: EXPLORATORY LAPAROTOMY, LYSIS OF ADHESIONS, REPAIR OF PEISTOMAL HERNIA;  Surgeon: Leighton Ruff, MD;  Location: [facility];  Service: General;  Laterality: N/A;  ? TONSILLECTOMY    ? as a child  ? TOTAL KNEE ARTHROPLASTY Right 01/16/2020  ? Procedure: RIGHT TOTAL KNEE ARTHROPLASTY;  Surgeon: Paralee Cancel, MD;  Location: [facility];  Service: Orthopedics;  Laterality: Right;  70 mins  ? TUBAL LIGATION  1983  ? UPPER GI ENDOSCOPY  04/02/2015  ? Procedure: UPPER GI ENDOSCOPY;  Surgeon: Johnathan Hausen, MD;  Location: [facility];  Service: General;;  ? VENTRAL HERNIA REPAIR  04/02/2015  ? Procedure: HERNIA REPAIR VENTRAL ADULT;  Surgeon: Johnathan Hausen, MD;  Location: [facility];  Service: General;;  ?  ?No current facility-administered medications for this encounter.  ? ?Current Outpatient Medications  ?Medication Sig Dispense Refill Last Dose  ? acetaminophen (TYLENOL) 500 MG tablet Take 2 tablets (1,000 mg total) by mouth every 8 (eight) hours. (Patient taking differently: Take 1,000 mg by mouth every 6 (six) hours as needed for moderate pain.) 30 tablet 0   ? cyanocobalamin (,VITAMIN B-12,) 1000 MCG/ML injection Inject 1 mL (1,000 mcg total) into the muscle every 14 (fourteen) days. 20 mL 3   ? cyclobenzaprine (FLEXERIL) 5 MG tablet TAKE 1 TABLET BY MOUTH EVERYDAY AT BEDTIME Follow-up appt is due must see provider for future refills 30 tablet 3   ? pantoprazole (PROTONIX) 40 MG tablet Take 1 tablet (40 mg total) by mouth daily. (Patient taking differently: Take 40 mg by mouth daily as needed (acid reflux).) 90 tablet 3   ? traMADol (ULTRAM) 50 MG tablet Take 1 tablet (50 mg total) by mouth every 6 (six) hours as needed for severe pain. 120 tablet 3   ? Cholecalciferol (VITAMIN D3) 50 MCG (2000 UT) capsule Take 1 capsule (2,000 Units total) by mouth daily. (Patient not taking: Reported on 07/07/2021) 100 capsule 3 Not Taking  ? phentermine (ADIPEX-P) 37.5 MG tablet TAKE 1 TABLET BY MOUTH EVERY  DAY BEFORE BREAKFAST (Patient taking differently: Take 37.5 mg by mouth every other day.) 30 tablet 2   ? SYRINGE-NEEDLE, DISP, 3 ML (BD INTEGRA SYRINGE) 25G X 1" 3 ML MISC TO BE USED FOR B12 INJECTIONS 50 each 3   ? Vitamin D, Ergocalciferol, (DRISDOL) 1.25 MG (50000 UNIT) CAPS capsule Take 1 capsule (50,000 Units total) by mouth every 7 (seven) days. (Patient not taking: Reported on 07/07/2021) 6 capsule 0 Not Taking  ? ?Allergies  ?Allergen Reactions  ? Erythromycin Base Nausea And Vomiting  ? Amphetamine-Dextroamphet Er Nausea And Vomiting  ? Cymbalta [Duloxetine Hcl] Other (See Comments)  ?  "Made me sleepy."  ? Erythromycin Ethylsuccinate Nausea And Vomiting  ? Morphine Nausea And Vomiting  ?  ?Social History  ? ?Tobacco Use  ? Smoking status: Former  ?  Years: 3.00  ?  Types: Cigarettes  ?  Quit date: 82  ?  Years since quitting: 48.2  ? Smokeless tobacco: Never  ?  Tobacco comments:  ?  very light social only-quit many years ago  ?Substance Use Topics  ? Alcohol use: No  ?  ?Family History  ?Problem Relation Age of Onset  ? Heart disease Father 45  ?     CHF  ? Depression Sister   ? Hypertension Other   ?  ? ?Review of Systems  ?Constitutional:  Negative for chills and fever.  ?Respiratory:  Negative for cough and shortness of breath.   ?Cardiovascular:  Negative for chest pain.  ?Gastrointestinal:  Negative for nausea and vomiting.  ?Musculoskeletal:  Positive for arthralgias.  ? ? ?Objective: ? ?Physical Exam ?Well nourished and well developed. ?General: Alert and oriented x3, cooperative and pleasant, no acute distress. ?Head: normocephalic, atraumatic, neck supple. ?Eyes: EOMI. ? ?Musculoskeletal: ?Left knee exam: ?No palpable effusion, warmth or erythema ?Tenderness over the medial and anterior aspect the knee ?Slight flexion contracture with flexion close to 110 degrees with tightness and discomfort and mild crepitation anteriorly ?No lower extremity edema or erythema ? ? ?Calves soft and nontender.  Motor function intact in LE. Strength 5/5 LE bilaterally. ?Neuro: Distal pulses 2+. Sensation to light touch intact in LE. ? ?Vital signs in last 24 hours: ?  ? ?Labs: ? ? ?Estimated body mass index is 32.95 kg/

## 2021-07-11 ENCOUNTER — Encounter (HOSPITAL_COMMUNITY): Payer: Medicare Other

## 2021-07-14 NOTE — Anesthesia Preprocedure Evaluation (Addendum)
Anesthesia Evaluation  ?Patient identified by MRN, date of birth, ID band ?Patient awake ? ? ? ?Reviewed: ?Allergy & Precautions, NPO status , [name] Chart, lab work & pertinent test results ? ?Airway ?Mallampati: II ? ?TM Distance: >3 FB ?Neck ROM: Full ? ? ? Dental ?no notable dental hx. ? ?  ?Pulmonary ?neg pulmonary ROS, former smoker,  ?  ?Pulmonary exam normal ?breath sounds clear to auscultation ? ? ? ? ? ? Cardiovascular ?hypertension, Normal cardiovascular exam ?Rhythm:Regular Rate:Normal ? ? ?  ?Neuro/Psych ?Anxiety Depression negative neurological ROS ?   ? GI/Hepatic ?Neg liver ROS, GERD  ,  ?Endo/Other  ?negative endocrine ROS ? Renal/GU ?Renal InsufficiencyRenal disease  ?negative genitourinary ?  ?Musculoskeletal ?negative musculoskeletal ROS ?(+)  ? Abdominal ?  ?Peds ?negative pediatric ROS ?(+)  Hematology ?negative hematology ROS ?(+)   ?Anesthesia Other Findings ? ? Reproductive/Obstetrics ?negative OB ROS ? ?  ? ? ? ? ? ? ? ? ? ? ? ? ? ?  ?  ? ? ? ? ? ? ? ?Anesthesia Physical ?Anesthesia Plan ? ?ASA: 2 ? ?Anesthesia Plan: Spinal  ? ?Post-op Pain Management: Regional block* and Dilaudid IV  ? ?Induction: Intravenous ? ?PONV Risk Score and Plan: 2 and Ondansetron and Propofol infusion ? ?Airway Management Planned: Simple Face Mask ? ?Additional Equipment:  ? ?Intra-op Plan:  ? ?Post-operative Plan:  ? ?Informed Consent: I have reviewed the patients History and Physical, chart, labs and discussed the procedure including the risks, benefits and alternatives for the proposed anesthesia with the patient or authorized representative who has indicated his/her understanding and acceptance.  ? ? ? ?Dental advisory given ? ?Plan Discussed with: CRNA and Surgeon ? ?Anesthesia Plan Comments:   ? ? ? ? ? ? ?Anesthesia Quick Evaluation ? ?

## 2021-07-15 ENCOUNTER — Encounter (HOSPITAL_COMMUNITY)
Admission: RE | Disposition: A | Discharge status: Home / Self Care | Payer: Self-pay | Source: Home / Self Care | Attending: Orthopedic Surgery

## 2021-07-15 ENCOUNTER — Other Ambulatory Visit: Payer: Self-pay

## 2021-07-15 ENCOUNTER — Encounter (HOSPITAL_COMMUNITY): Payer: Self-pay | Admitting: Orthopedic Surgery

## 2021-07-15 ENCOUNTER — Ambulatory Visit (HOSPITAL_COMMUNITY): Payer: Medicare Other | Admitting: Physician Assistant

## 2021-07-15 ENCOUNTER — Ambulatory Visit (HOSPITAL_BASED_OUTPATIENT_CLINIC_OR_DEPARTMENT_OTHER): Payer: Medicare Other | Admitting: Anesthesiology

## 2021-07-15 ENCOUNTER — Observation Stay (HOSPITAL_COMMUNITY)
Admission: RE | Admit: 2021-07-15 | Discharge: 2021-07-16 | Disposition: A | Discharge status: Home / Self Care | Payer: Medicare Other | Attending: Orthopedic Surgery | Admitting: Orthopedic Surgery

## 2021-07-15 DIAGNOSIS — M1712 Unilateral primary osteoarthritis, left knee: Principal | ICD-10-CM | POA: Insufficient documentation

## 2021-07-15 DIAGNOSIS — F418 Other specified anxiety disorders: Secondary | ICD-10-CM

## 2021-07-15 DIAGNOSIS — N183 Chronic kidney disease, stage 3 unspecified: Secondary | ICD-10-CM | POA: Insufficient documentation

## 2021-07-15 DIAGNOSIS — G8918 Other acute postprocedural pain: Secondary | ICD-10-CM | POA: Diagnosis not present

## 2021-07-15 DIAGNOSIS — I129 Hypertensive chronic kidney disease with stage 1 through stage 4 chronic kidney disease, or unspecified chronic kidney disease: Secondary | ICD-10-CM | POA: Diagnosis not present

## 2021-07-15 DIAGNOSIS — M25762 Osteophyte, left knee: Secondary | ICD-10-CM | POA: Diagnosis not present

## 2021-07-15 DIAGNOSIS — I10 Essential (primary) hypertension: Secondary | ICD-10-CM

## 2021-07-15 DIAGNOSIS — Z79899 Other long term (current) drug therapy: Secondary | ICD-10-CM | POA: Diagnosis not present

## 2021-07-15 DIAGNOSIS — Z87891 Personal history of nicotine dependence: Secondary | ICD-10-CM

## 2021-07-15 DIAGNOSIS — N289 Disorder of kidney and ureter, unspecified: Secondary | ICD-10-CM

## 2021-07-15 DIAGNOSIS — Z96651 Presence of right artificial knee joint: Secondary | ICD-10-CM | POA: Insufficient documentation

## 2021-07-15 DIAGNOSIS — Z96652 Presence of left artificial knee joint: Secondary | ICD-10-CM

## 2021-07-15 HISTORY — PX: TOTAL KNEE ARTHROPLASTY: SHX125

## 2021-07-15 LAB — TYPE AND SCREEN
ABO/RH(D): O POS
Antibody Screen: NEGATIVE

## 2021-07-15 SURGERY — ARTHROPLASTY, KNEE, TOTAL
Anesthesia: Spinal | Site: Knee | Laterality: Left

## 2021-07-15 MED ORDER — PROPOFOL 500 MG/50ML IV EMUL
INTRAVENOUS | Status: DC | PRN
  Administered 2021-07-15: 50 ug/kg/min via INTRAVENOUS

## 2021-07-15 MED ORDER — DEXAMETHASONE SODIUM PHOSPHATE 10 MG/ML IJ SOLN
INTRAMUSCULAR | Status: AC
  Filled 2021-07-15: qty 1

## 2021-07-15 MED ORDER — LABETALOL HCL 5 MG/ML IV SOLN
INTRAVENOUS | Status: AC
  Filled 2021-07-15: qty 4

## 2021-07-15 MED ORDER — METOCLOPRAMIDE HCL 5 MG PO TABS: 5.0000 mg | ORAL_TABLET | Freq: Three times a day (TID) | ORAL | Status: DC | PRN

## 2021-07-15 MED ORDER — STERILE WATER FOR IRRIGATION IR SOLN
Status: DC | PRN
  Administered 2021-07-15: 2000 mL

## 2021-07-15 MED ORDER — LABETALOL HCL 5 MG/ML IV SOLN
5.0000 mg | Freq: Once | INTRAVENOUS | Status: AC | PRN
  Administered 2021-07-15: 5 mg via INTRAVENOUS

## 2021-07-15 MED ORDER — DEXAMETHASONE SODIUM PHOSPHATE 10 MG/ML IJ SOLN: 8.0000 mg | Freq: Once | INTRAMUSCULAR | Status: DC

## 2021-07-15 MED ORDER — PROPOFOL 10 MG/ML IV BOLUS
INTRAVENOUS | Status: AC
  Filled 2021-07-15: qty 20

## 2021-07-15 MED ORDER — FENTANYL CITRATE PF 50 MCG/ML IJ SOSY
50.0000 ug | PREFILLED_SYRINGE | INTRAMUSCULAR | Status: DC
  Administered 2021-07-15: 50 ug via INTRAVENOUS
  Filled 2021-07-15: qty 2

## 2021-07-15 MED ORDER — POVIDONE-IODINE 10 % EX SWAB
2.0000 "application " | Freq: Once | CUTANEOUS | Status: AC
  Administered 2021-07-15: 2 via TOPICAL

## 2021-07-15 MED ORDER — HYDROMORPHONE HCL 1 MG/ML IJ SOLN
0.2500 mg | INTRAMUSCULAR | Status: DC | PRN
  Administered 2021-07-15 (×2): 0.5 mg via INTRAVENOUS

## 2021-07-15 MED ORDER — PHENYLEPHRINE HCL-NACL 20-0.9 MG/250ML-% IV SOLN
INTRAVENOUS | Status: DC | PRN
  Administered 2021-07-15: 50 ug/min via INTRAVENOUS

## 2021-07-15 MED ORDER — SODIUM CHLORIDE (PF) 0.9 % IJ SOLN
INTRAMUSCULAR | Status: DC | PRN
  Administered 2021-07-15: 30 mL

## 2021-07-15 MED ORDER — BISACODYL 10 MG RE SUPP: 10.0000 mg | Freq: Every day | RECTAL | Status: DC | PRN

## 2021-07-15 MED ORDER — BUPIVACAINE IN DEXTROSE 0.75-8.25 % IT SOLN
INTRATHECAL | Status: DC | PRN
  Administered 2021-07-15: 1.6 mL via INTRATHECAL

## 2021-07-15 MED ORDER — DIPHENHYDRAMINE HCL 12.5 MG/5ML PO ELIX: 12.5000 mg | ORAL_SOLUTION | ORAL | Status: DC | PRN

## 2021-07-15 MED ORDER — SODIUM CHLORIDE 0.9 % IV SOLN: INTRAVENOUS | Status: DC

## 2021-07-15 MED ORDER — SODIUM CHLORIDE (PF) 0.9 % IJ SOLN
INTRAMUSCULAR | Status: AC
  Filled 2021-07-15: qty 30

## 2021-07-15 MED ORDER — CHLORHEXIDINE GLUCONATE 0.12 % MT SOLN: 15.0000 mL | Freq: Once | OROMUCOSAL | Status: AC

## 2021-07-15 MED ORDER — ORAL CARE MOUTH RINSE
15.0000 mL | Freq: Once | OROMUCOSAL | Status: AC
  Administered 2021-07-15: 15 mL via OROMUCOSAL

## 2021-07-15 MED ORDER — BUPIVACAINE-EPINEPHRINE (PF) 0.25% -1:200000 IJ SOLN
INTRAMUSCULAR | Status: AC
  Filled 2021-07-15: qty 30

## 2021-07-15 MED ORDER — SODIUM CHLORIDE 0.9 % IR SOLN
Status: DC | PRN
  Administered 2021-07-15: 1000 mL

## 2021-07-15 MED ORDER — OXYCODONE HCL 5 MG PO TABS
10.0000 mg | ORAL_TABLET | ORAL | Status: DC | PRN
  Administered 2021-07-16 (×4): 15 mg via ORAL
  Filled 2021-07-15 (×4): qty 3

## 2021-07-15 MED ORDER — METOCLOPRAMIDE HCL 5 MG/ML IJ SOLN: 5.0000 mg | Freq: Three times a day (TID) | INTRAMUSCULAR | Status: DC | PRN

## 2021-07-15 MED ORDER — PHENYLEPHRINE HCL (PRESSORS) 10 MG/ML IV SOLN
INTRAVENOUS | Status: AC
  Filled 2021-07-15: qty 1

## 2021-07-15 MED ORDER — OXYCODONE HCL 5 MG PO TABS
5.0000 mg | ORAL_TABLET | Freq: Once | ORAL | Status: AC | PRN
  Administered 2021-07-15: 5 mg via ORAL

## 2021-07-15 MED ORDER — HYDROMORPHONE HCL 1 MG/ML IJ SOLN
0.5000 mg | INTRAMUSCULAR | Status: DC | PRN
  Administered 2021-07-15: 1 mg via INTRAVENOUS
  Filled 2021-07-15: qty 1

## 2021-07-15 MED ORDER — MIDAZOLAM HCL 2 MG/2ML IJ SOLN
INTRAMUSCULAR | Status: DC | PRN
  Administered 2021-07-15: 1 mg via INTRAVENOUS

## 2021-07-15 MED ORDER — ONDANSETRON HCL 4 MG/2ML IJ SOLN
INTRAMUSCULAR | Status: AC
  Filled 2021-07-15: qty 2

## 2021-07-15 MED ORDER — HYDROMORPHONE HCL 1 MG/ML IJ SOLN
INTRAMUSCULAR | Status: AC
  Filled 2021-07-15: qty 1

## 2021-07-15 MED ORDER — CYCLOBENZAPRINE HCL 5 MG PO TABS
5.0000 mg | ORAL_TABLET | Freq: Three times a day (TID) | ORAL | Status: DC | PRN
  Administered 2021-07-15: 5 mg via ORAL
  Filled 2021-07-15: qty 1

## 2021-07-15 MED ORDER — OXYCODONE HCL 5 MG PO TABS
5.0000 mg | ORAL_TABLET | ORAL | Status: DC | PRN
  Administered 2021-07-15 (×2): 10 mg via ORAL
  Filled 2021-07-15 (×2): qty 2

## 2021-07-15 MED ORDER — ACETAMINOPHEN 10 MG/ML IV SOLN
1000.0000 mg | Freq: Once | INTRAVENOUS | Status: DC | PRN
  Administered 2021-07-15: 1000 mg via INTRAVENOUS

## 2021-07-15 MED ORDER — LACTATED RINGERS IV SOLN: INTRAVENOUS | Status: DC

## 2021-07-15 MED ORDER — KETOROLAC TROMETHAMINE 30 MG/ML IJ SOLN
INTRAMUSCULAR | Status: DC | PRN
  Administered 2021-07-15: 30 mg

## 2021-07-15 MED ORDER — LIDOCAINE HCL (CARDIAC) PF 100 MG/5ML IV SOSY
PREFILLED_SYRINGE | INTRAVENOUS | Status: DC | PRN
  Administered 2021-07-15: 100 mg via INTRATRACHEAL

## 2021-07-15 MED ORDER — MIDAZOLAM HCL 2 MG/2ML IJ SOLN
1.0000 mg | INTRAMUSCULAR | Status: DC
  Administered 2021-07-15: 1 mg via INTRAVENOUS
  Filled 2021-07-15: qty 2

## 2021-07-15 MED ORDER — ONDANSETRON HCL 4 MG/2ML IJ SOLN: 4.0000 mg | Freq: Once | INTRAMUSCULAR | Status: DC | PRN

## 2021-07-15 MED ORDER — ONDANSETRON HCL 4 MG/2ML IJ SOLN
INTRAMUSCULAR | Status: DC | PRN
  Administered 2021-07-15: 4 mg via INTRAVENOUS

## 2021-07-15 MED ORDER — DEXAMETHASONE SODIUM PHOSPHATE 10 MG/ML IJ SOLN
INTRAMUSCULAR | Status: DC | PRN
  Administered 2021-07-15: 10 mg via INTRAVENOUS

## 2021-07-15 MED ORDER — ACETAMINOPHEN 10 MG/ML IV SOLN
INTRAVENOUS | Status: AC
  Filled 2021-07-15: qty 100

## 2021-07-15 MED ORDER — ACETAMINOPHEN 325 MG PO TABS: 325.0000 mg | ORAL_TABLET | Freq: Four times a day (QID) | ORAL | Status: DC | PRN

## 2021-07-15 MED ORDER — TRANEXAMIC ACID-NACL 1000-0.7 MG/100ML-% IV SOLN
1000.0000 mg | INTRAVENOUS | Status: AC
  Administered 2021-07-15: 1000 mg via INTRAVENOUS
  Filled 2021-07-15: qty 100

## 2021-07-15 MED ORDER — DEXAMETHASONE SODIUM PHOSPHATE 10 MG/ML IJ SOLN: 10.0000 mg | Freq: Once | INTRAMUSCULAR | Status: DC

## 2021-07-15 MED ORDER — PROPOFOL 500 MG/50ML IV EMUL
INTRAVENOUS | Status: AC
  Filled 2021-07-15: qty 50

## 2021-07-15 MED ORDER — OXYCODONE HCL 5 MG/5ML PO SOLN: 5.0000 mg | Freq: Once | ORAL | Status: AC | PRN

## 2021-07-15 MED ORDER — ONDANSETRON HCL 4 MG PO TABS: 4.0000 mg | ORAL_TABLET | Freq: Four times a day (QID) | ORAL | Status: DC | PRN

## 2021-07-15 MED ORDER — OXYCODONE HCL 5 MG PO TABS
ORAL_TABLET | ORAL | Status: AC
  Filled 2021-07-15: qty 1

## 2021-07-15 MED ORDER — PHENOL 1.4 % MT LIQD: 1.0000 | OROMUCOSAL | Status: DC | PRN

## 2021-07-15 MED ORDER — CEFAZOLIN SODIUM-DEXTROSE 2-4 GM/100ML-% IV SOLN
2.0000 g | INTRAVENOUS | Status: AC
  Administered 2021-07-15: 2 g via INTRAVENOUS
  Filled 2021-07-15: qty 100

## 2021-07-15 MED ORDER — PANTOPRAZOLE SODIUM 40 MG PO TBEC
40.0000 mg | DELAYED_RELEASE_TABLET | Freq: Every day | ORAL | Status: DC | PRN
Start: 2021-07-15 — End: 2021-07-16

## 2021-07-15 MED ORDER — MENTHOL 3 MG MT LOZG: 1.0000 | LOZENGE | OROMUCOSAL | Status: DC | PRN

## 2021-07-15 MED ORDER — ONDANSETRON HCL 4 MG/2ML IJ SOLN
4.0000 mg | Freq: Four times a day (QID) | INTRAMUSCULAR | Status: DC | PRN
  Administered 2021-07-15: 4 mg via INTRAVENOUS
  Filled 2021-07-15: qty 2

## 2021-07-15 MED ORDER — ASPIRIN 81 MG PO CHEW
81.0000 mg | CHEWABLE_TABLET | Freq: Two times a day (BID) | ORAL | Status: DC
  Administered 2021-07-15 – 2021-07-16 (×2): 81 mg via ORAL
  Filled 2021-07-15 (×2): qty 1

## 2021-07-15 MED ORDER — ACETAMINOPHEN 500 MG PO TABS
1000.0000 mg | ORAL_TABLET | Freq: Four times a day (QID) | ORAL | Status: AC
  Administered 2021-07-15 (×2): 1000 mg via ORAL
  Filled 2021-07-15 (×3): qty 2

## 2021-07-15 MED ORDER — PROPOFOL 10 MG/ML IV BOLUS
INTRAVENOUS | Status: DC | PRN
  Administered 2021-07-15: 30 mg via INTRAVENOUS
  Administered 2021-07-15 (×2): 20 mg via INTRAVENOUS
  Administered 2021-07-15 (×2): 30 mg via INTRAVENOUS
  Administered 2021-07-15: 40 mg via INTRAVENOUS
  Administered 2021-07-15: 30 mg via INTRAVENOUS

## 2021-07-15 MED ORDER — KETOROLAC TROMETHAMINE 30 MG/ML IJ SOLN
INTRAMUSCULAR | Status: AC
  Filled 2021-07-15: qty 1

## 2021-07-15 MED ORDER — BUPIVACAINE-EPINEPHRINE (PF) 0.25% -1:200000 IJ SOLN
INTRAMUSCULAR | Status: DC | PRN
Start: 2021-07-15 — End: 2021-07-15
  Administered 2021-07-15: 30 mL

## 2021-07-15 MED ORDER — FERROUS SULFATE 325 (65 FE) MG PO TABS
325.0000 mg | ORAL_TABLET | Freq: Three times a day (TID) | ORAL | Status: DC
  Administered 2021-07-15 – 2021-07-16 (×2): 325 mg via ORAL
  Filled 2021-07-15 (×2): qty 1

## 2021-07-15 MED ORDER — CEFAZOLIN SODIUM-DEXTROSE 2-4 GM/100ML-% IV SOLN
2.0000 g | Freq: Four times a day (QID) | INTRAVENOUS | Status: AC
  Administered 2021-07-15 (×2): 2 g via INTRAVENOUS
  Filled 2021-07-15 (×2): qty 100

## 2021-07-15 MED ORDER — POLYETHYLENE GLYCOL 3350 17 G PO PACK: 17.0000 g | PACK | Freq: Every day | ORAL | Status: DC | PRN

## 2021-07-15 MED ORDER — TRANEXAMIC ACID-NACL 1000-0.7 MG/100ML-% IV SOLN
1000.0000 mg | Freq: Once | INTRAVENOUS | Status: AC
  Administered 2021-07-15: 1000 mg via INTRAVENOUS
  Filled 2021-07-15: qty 100

## 2021-07-15 MED ORDER — MIDAZOLAM HCL 2 MG/2ML IJ SOLN
INTRAMUSCULAR | Status: AC
  Filled 2021-07-15: qty 2

## 2021-07-15 MED ORDER — DOCUSATE SODIUM 100 MG PO CAPS
100.0000 mg | ORAL_CAPSULE | Freq: Two times a day (BID) | ORAL | Status: DC
  Filled 2021-07-15 (×2): qty 1

## 2021-07-15 MED ORDER — LABETALOL HCL 5 MG/ML IV SOLN: 5.0000 mg | Freq: Once | INTRAVENOUS | Status: DC | PRN

## 2021-07-15 MED ORDER — ROPIVACAINE HCL 5 MG/ML IJ SOLN
INTRAMUSCULAR | Status: DC | PRN
  Administered 2021-07-15: 20 mL via PERINEURAL

## 2021-07-15 MED ORDER — 0.9 % SODIUM CHLORIDE (POUR BTL) OPTIME
TOPICAL | Status: DC | PRN
  Administered 2021-07-15: 1000 mL

## 2021-07-15 MED ORDER — LIDOCAINE HCL (PF) 2 % IJ SOLN
INTRAMUSCULAR | Status: AC
  Filled 2021-07-15: qty 5

## 2021-07-15 SURGICAL SUPPLY — 52 items
ATTUNE MED ANAT PAT 35 KNEE (Knees) ×1 IMPLANT
ATTUNE PSFEM LTSZ6 NARCEM KNEE (Femur) ×1 IMPLANT
ATTUNE PSRP INSR SZ6 10 KNEE (Insert) ×1 IMPLANT
BAG COUNTER SPONGE SURGICOUNT (BAG) ×1 IMPLANT
BAG ZIPLOCK 12X15 (MISCELLANEOUS) ×1 IMPLANT
BASE TIBIA ATTUNE KNEE SYS SZ6 (Knees) IMPLANT
BLADE SAW SGTL 11.0X1.19X90.0M (BLADE) IMPLANT
BLADE SAW SGTL 13.0X1.19X90.0M (BLADE) ×2 IMPLANT
BLADE SURG SZ10 CARB STEEL (BLADE) ×4 IMPLANT
BNDG ELASTIC 6X5.8 VLCR STR LF (GAUZE/BANDAGES/DRESSINGS) ×2 IMPLANT
BOWL SMART MIX CTS (DISPOSABLE) ×2 IMPLANT
CEMENT HV SMART SET (Cement) ×2 IMPLANT
CUFF TOURN SGL QUICK 34 (TOURNIQUET CUFF) ×2
CUFF TRNQT CYL 34X4.125X (TOURNIQUET CUFF) ×1 IMPLANT
DERMABOND ADVANCED (GAUZE/BANDAGES/DRESSINGS) ×1
DERMABOND ADVANCED .7 DNX12 (GAUZE/BANDAGES/DRESSINGS) ×1 IMPLANT
DRAPE INCISE IOBAN 66X45 STRL (DRAPES) ×2 IMPLANT
DRAPE U-SHAPE 47X51 STRL (DRAPES) ×2 IMPLANT
DRESSING AQUACEL AG SP 3.5X10 (GAUZE/BANDAGES/DRESSINGS) ×1 IMPLANT
DRSG AQUACEL AG ADV 3.5X10 (GAUZE/BANDAGES/DRESSINGS) ×1 IMPLANT
DRSG AQUACEL AG SP 3.5X10 (GAUZE/BANDAGES/DRESSINGS) ×2
DURAPREP 26ML APPLICATOR (WOUND CARE) ×4 IMPLANT
ELECT REM PT RETURN 15FT ADLT (MISCELLANEOUS) ×2 IMPLANT
GLOVE SURG ENC MOIS LTX SZ6 (GLOVE) ×2 IMPLANT
GLOVE SURG ENC MOIS LTX SZ7 (GLOVE) ×2 IMPLANT
GLOVE SURG UNDER LTX SZ6.5 (GLOVE) ×2 IMPLANT
GLOVE SURG UNDER POLY LF SZ7.5 (GLOVE) ×2 IMPLANT
GOWN STRL REUS W/ TWL LRG LVL3 (GOWN DISPOSABLE) ×1 IMPLANT
GOWN STRL REUS W/TWL LRG LVL3 (GOWN DISPOSABLE) ×2
HANDPIECE INTERPULSE COAX TIP (DISPOSABLE) ×2
HOLDER FOLEY CATH W/STRAP (MISCELLANEOUS) ×1 IMPLANT
KIT TURNOVER KIT A (KITS) ×1 IMPLANT
MANIFOLD NEPTUNE II (INSTRUMENTS) ×2 IMPLANT
NDL SAFETY ECLIPSE 18X1.5 (NEEDLE) IMPLANT
NEEDLE HYPO 18GX1.5 SHARP (NEEDLE) ×2
NS IRRIG 1000ML POUR BTL (IV SOLUTION) ×2 IMPLANT
PACK TOTAL KNEE CUSTOM (KITS) ×2 IMPLANT
PROTECTOR NERVE ULNAR (MISCELLANEOUS) ×2 IMPLANT
SET HNDPC FAN SPRY TIP SCT (DISPOSABLE) ×1 IMPLANT
SET PAD KNEE POSITIONER (MISCELLANEOUS) ×2 IMPLANT
SPIKE FLUID TRANSFER (MISCELLANEOUS) ×4 IMPLANT
SUT MNCRL AB 4-0 PS2 18 (SUTURE) ×2 IMPLANT
SUT STRATAFIX PDS+ 0 24IN (SUTURE) ×2 IMPLANT
SUT VIC AB 1 CT1 36 (SUTURE) ×2 IMPLANT
SUT VIC AB 2-0 CT1 27 (SUTURE) ×4
SUT VIC AB 2-0 CT1 TAPERPNT 27 (SUTURE) ×2 IMPLANT
SYR 3ML LL SCALE MARK (SYRINGE) ×2 IMPLANT
TIBIA ATTUNE KNEE SYS BASE SZ6 (Knees) ×2 IMPLANT
TRAY FOLEY MTR SLVR 16FR STAT (SET/KITS/TRAYS/PACK) ×2 IMPLANT
TUBE SUCTION HIGH CAP CLEAR NV (SUCTIONS) ×2 IMPLANT
WATER STERILE IRR 1000ML POUR (IV SOLUTION) ×4 IMPLANT
WRAP KNEE MAXI GEL POST OP (GAUZE/BANDAGES/DRESSINGS) ×2 IMPLANT

## 2021-07-15 NOTE — Transfer of Care (Signed)
Immediate Anesthesia Transfer of Care Note ? ?Patient: Emily Sanchez ? ?Procedure(s) Performed: TOTAL KNEE ARTHROPLASTY (Left: Knee) ? ?Patient Location: PACU ? ?Anesthesia Type:Spinal ? ?Level of Consciousness: awake, alert  and oriented ? ?Airway & Oxygen Therapy: Patient Spontanous Breathing and Patient connected to face mask oxygen ? ?Post-op Assessment: Report given to RN and Post -op Vital signs reviewed and stable ? ?Post vital signs: Reviewed and stable ? ?Last Vitals:  ?Vitals Value Taken Time  ?BP 148/88 07/15/21 1032  ?Temp    ?Pulse 106 07/15/21 1033  ?Resp 24 07/15/21 1033  ?SpO2 100 % 07/15/21 1033  ?Vitals shown include unvalidated device data. ? ?Last Pain:  ?Vitals:  ? 07/15/21 0759  ?TempSrc:   ?PainSc: 0-No pain  ?   ? ?  ? ?Complications: No notable events documented. ?

## 2021-07-15 NOTE — Progress Notes (Signed)
Assisted Dr. Kalman Shan with left, adductor canal, ultrasound guided block. Side rails up, monitors on throughout procedure. See vital signs in flow sheet. Tolerated Procedure well. ? ?

## 2021-07-15 NOTE — Evaluation (Signed)
Physical Therapy Evaluation ?Patient Details ?Name: Emily Sanchez ?MRN: [id number] ?DOB: Apr 06, 1956 ?Today's Date: 07/15/2021 ? ?History of Present Illness ? Patient is 66 y.o. female s/p L-TKA on 07/15/21 with PMH significant for OA, HTN, GERD, depression, anxiety, anemia, L-TKA 2021, Crohn's with ileostomy.  ?Clinical Impression ? Emily Sanchez is a 66 y.o. female POD 0 s/p L-TKA. Patient reports independence with mobility at baseline. Patient is now limited by functional impairments (see PT problem list below) and requires min assist for transfers. Patient was able to ambulate 5 feet with RW and min guard assist. Patient instructed in exercise to facilitate ROM and circulation to manage edema. Patient will benefit from continued skilled PT interventions to address impairments and progress towards PLOF. Acute PT will follow to progress mobility and stair training in preparation for safe discharge home. ?   ?   ? ?Recommendations for follow up therapy are one component of a multi-disciplinary discharge planning process, led by the attending physician.  Recommendations may be updated based on patient status, additional functional criteria and insurance authorization. ? ?Follow Up Recommendations Follow physician's recommendations for discharge plan and follow up therapies ? ?  ?Assistance Recommended at Discharge Intermittent Supervision/Assistance  ?Patient can return home with the following ? A little help with walking and/or transfers;A little help with bathing/dressing/bathroom;Assistance with cooking/housework;Assist for transportation;Help with stairs or ramp for entrance ? ?  ?Equipment Recommendations None recommended by PT (pt has recommended DME)  ?Recommendations for Other Services ?    ?  ?Functional Status Assessment Patient has had a recent decline in their functional status and demonstrates the ability to make significant improvements in function in a reasonable and predictable amount of  time.  ? ?  ?Precautions / Restrictions Precautions ?Precautions: Fall ?Restrictions ?Weight Bearing Restrictions: No ?Other Position/Activity Restrictions: WBAT  ? ?  ? ?Mobility ? Bed Mobility ?Overal bed mobility: Needs Assistance ?Bed Mobility: Supine to Sit ?  ?  ?Supine to sit: Min assist ?  ?  ?General bed mobility comments: Pt min assist for advancing LLE off bed and scooting hip forward, otherwise min guard for safety and line management only. ?  ? ?Transfers ?Overall transfer level: Needs assistance ?Equipment used: Rolling walker (2 wheels) ?Transfers: Sit to/from Stand ?Sit to Stand: Min guard ?  ?  ?  ?  ?  ?General transfer comment: Pt min guard for safety and line management only, no physical assist required. VC for sequencing and hand placement. ?  ? ?Ambulation/Gait ?Ambulation/Gait assistance: Min guard ?Gait Distance (Feet): 5 Feet ?Assistive device: Rolling walker (2 wheels) ?Gait Pattern/deviations: Step-to pattern, Decreased step length - left, Decreased step length - right, Decreased stance time - left, Decreased weight shift to left ?Gait velocity: decreased ?  ?  ?General Gait Details: Pt ambulated 50f in room with min guard for safety and line management only, no physical assist required. Demonstrated decreased step length bilaterally as well as stance time on the left; antalgic gait pattern secondary to pain. No overt LOB noted. ? ?Stairs ?  ?  ?  ?  ?  ? ?Wheelchair Mobility ?  ? ?Modified Rankin (Stroke Patients Only) ?  ? ?  ? ?Balance Overall balance assessment: Needs assistance ?Sitting-balance support: Feet supported, No upper extremity supported ?Sitting balance-Leahy Scale: Fair ?  ?  ?Standing balance support: Reliant on assistive device for balance, During functional activity, Bilateral upper extremity supported ?Standing balance-Leahy Scale: Poor ?  ?  ?  ?  ?  ?  ?  ?  ?  ?  ?  ?  ?   ? ? ? ?  Pertinent Vitals/Pain Pain Assessment ?Pain Assessment: 0-10 ?Pain Score: 8  ?Pain  Location: left knee ?Pain Descriptors / Indicators: Operative site guarding, Grimacing, Discomfort ?Pain Intervention(s): Limited activity within patient's tolerance, Ice applied, Monitored during session, Repositioned  ? ? ?Home Living Family/patient expects to be discharged to:: Private residence ?Living Arrangements: Alone (husband died recently) ?Available Help at Discharge: Family;Available 24 hours/day (Daughter Criselda Peaches and Sister) ?Type of Home: House ?Home Access: Stairs to enter ?Entrance Stairs-Rails: Right;Left ?Entrance Stairs-Number of Steps: 6 ?  ?Home Layout: One level ?Home Equipment: Rolling Walker (2 wheels);BSC/3in1 ?Additional Comments: Pt's husband recently passed away but she has supportive family living nearby; currently plans to return home but may end up discharging to sister's house. Home environment information is pertinent to pt's home.  ?  ?Prior Function Prior Level of Function : Independent/Modified Independent ?  ?  ?  ?  ?  ?  ?Mobility Comments: IND ?ADLs Comments: IND ?  ? ? ?Hand Dominance  ?   ? ?  ?Extremity/Trunk Assessment  ? Upper Extremity Assessment ?Upper Extremity Assessment: Overall WFL for tasks assessed ?  ? ?Lower Extremity Assessment ?Lower Extremity Assessment: RLE deficits/detail;LLE deficits/detail ?RLE Deficits / Details: MMT: ank pf/df 5/5 ?RLE Sensation: WNL ?LLE Deficits / Details: MMT: ank pf/df 5/5, no extensor lag noted ?LLE Sensation: WNL ?  ? ?Cervical / Trunk Assessment ?Cervical / Trunk Assessment: Normal  ?Communication  ? Communication: No difficulties  ?Cognition Arousal/Alertness: Awake/alert ?Behavior During Therapy: [facility] for tasks assessed/performed ?Overall Cognitive Status: Within Functional Limits for tasks assessed ?  ?  ?  ?  ?  ?  ?  ?  ?  ?  ?  ?  ?  ?  ?  ?  ?  ?  ?  ? ?  ?General Comments   ? ?  ?Exercises Total Joint Exercises ?Ankle Circles/Pumps: AROM, 20 reps, Both ?Quad Sets: AROM, Left, 5 reps  ? ?Assessment/Plan  ?  ?PT Assessment  Patient needs continued PT services  ?PT Problem List Decreased strength;Decreased range of motion;Decreased activity tolerance;Decreased balance;Decreased mobility;Decreased coordination;Decreased knowledge of use of DME;Pain ? ?   ?  ?PT Treatment Interventions DME instruction;Gait training;Stair training;Functional mobility training;Therapeutic activities;Therapeutic exercise;Balance training;Neuromuscular re-education;Patient/family education   ? ?PT Goals (Current goals can be found in the Care Plan section)  ?Acute Rehab PT Goals ?Patient Stated Goal: To go home ?PT Goal Formulation: With patient/family ?Time For Goal Achievement: 07/22/21 ?Potential to Achieve Goals: Good ? ?  ?Frequency 7X/week ?  ? ? ?Co-evaluation   ?  ?  ?  ?  ? ? ?  ?AM-PAC PT "6 Clicks" Mobility  ?Outcome Measure Help needed turning from your back to your side while in a flat bed without using bedrails?: A Little ?Help needed moving from lying on your back to sitting on the side of a flat bed without using bedrails?: A Little ?Help needed moving to and from a bed to a chair (including a wheelchair)?: A Little ?Help needed standing up from a chair using your arms (e.g., wheelchair or bedside chair)?: A Little ?Help needed to walk in hospital room?: A Little ?Help needed climbing 3-5 steps with a railing? : A Little ?6 Click Score: 18 ? ?  ?End of Session Equipment Utilized During Treatment: Gait belt ?Activity Tolerance: Patient tolerated treatment well ?Patient left: in chair;with call bell/phone within reach;with chair alarm set;with family/visitor present ?Nurse Communication: Mobility status ?PT Visit Diagnosis: Difficulty in walking, not elsewhere classified (R26.2);Pain ?  Pain - Right/Left: Left ?Pain - part of body: Knee ?  ? ?Time: 1705-1740 ?PT Time Calculation (min) (ACUTE ONLY): 35 min ? ? ?Charges:   PT Evaluation ?$PT Eval Low Complexity: 1 Low ?PT Treatments ?$Gait Training: 8-22 mins ?  ?   ? ?Coolidge Breeze, PT, DPT ?[facility]  Rehabilitation Department ?Office: [phone] ?Pager: [phone] ? ?Coolidge Breeze ?07/15/2021, 6:45 PM ? ?

## 2021-07-15 NOTE — Care Plan (Signed)
Ortho Bundle Case Management Note ? ?Patient Details  ?Name: Emily Sanchez ?MRN: [id number] ?Date of Birth: 07/29/1955 ? ?L TKA on 07-15-21 ?DCP:  Home with sister ?DME:  No needs, has a RW ?PT:  [facility] on 07-18-21                ? ? ? ?DME Arranged:  N/A ?DME Agency:  NA ? ?HH Arranged:  NA ?[place] Agency:  NA ? ?Additional Comments: ?Please contact me with any questions of if this plan should need to change. ? ?Larwance Rote  [phone] ?07/15/2021, 11:22 AM ?  ?

## 2021-07-15 NOTE — Interval H&P Note (Signed)
History and Physical Interval Note: ? ?07/15/2021 ?6:59 AM ? ?Emily Sanchez  has presented today for surgery, with the diagnosis of Left knee osteoarthritis.  The various methods of treatment have been discussed with the patient and family. After consideration of risks, benefits and other options for treatment, the patient has consented to  Procedure(s): ?TOTAL KNEE ARTHROPLASTY (Left) as a surgical intervention.  The patient's history has been reviewed, patient examined, no change in status, stable for surgery.  I have reviewed the patient's chart and labs.  Questions were answered to the patient's satisfaction.   ? ? ?Mauri Pole ? ? ?

## 2021-07-15 NOTE — Anesthesia Procedure Notes (Signed)
Anesthesia Procedure Image    

## 2021-07-15 NOTE — Plan of Care (Signed)
?  Problem: Education: ?Goal: Knowledge of General Education information will improve ?Description: Including pain rating scale, medication(s)/side effects and non-pharmacologic comfort measures ?Outcome: Progressing ?  ?Problem: Activity: ?Goal: Risk for activity intolerance will decrease ?Outcome: Progressing ?  ?Problem: Nutrition: ?Goal: Adequate nutrition will be maintained ?Outcome: Progressing ?  ?Problem: Elimination: ?Goal: Will not experience complications related to bowel motility ?Outcome: Progressing ?  ?Problem: Pain Managment: ?Goal: General experience of comfort will improve ?Outcome: Progressing ?  ?Problem: Safety: ?Goal: Ability to remain free from injury will improve ?Outcome: Progressing ?  ?Problem: Education: ?Goal: Knowledge of the prescribed therapeutic regimen will improve ?Outcome: Progressing ?  ?Problem: Activity: ?Goal: Ability to avoid complications of mobility impairment will improve ?Outcome: Progressing ?  ?Problem: Pain Management: ?Goal: Pain level will decrease with appropriate interventions ?Outcome: Progressing ?  ?

## 2021-07-15 NOTE — Discharge Instructions (Signed)

## 2021-07-15 NOTE — Op Note (Signed)
NAME:  Emily Sanchez                  ?   ? MEDICAL RECORD NO.:  [id number]  ?   ?                        FACILITY:  [facility]  ?   ? PHYSICIAN:  Pietro Cassis. Alvan Dame, M.D.  DATE OF BIRTH:  October 12, 1955  ?   ? DATE OF PROCEDURE:  07/15/2021   ?  ?   ?                             OPERATIVE REPORT  ?   ?   ? PREOPERATIVE DIAGNOSIS:  Left knee osteoarthritis.  ?   ? POSTOPERATIVE DIAGNOSIS:  Left knee osteoarthritis.  ?   ? FINDINGS:  The patient was noted to have complete loss of cartilage and  ? bone-on-bone arthritis with associated osteophytes in the medial and patellofemoral compartments of  ? the knee with a significant synovitis and associated effusion.  The patient had failed months of conservative treatment including medications, injection therapy, activity modification. ?   ? PROCEDURE:  Left total knee replacement.  ?   ? COMPONENTS USED:  DePuy Attune rotating platform posterior stabilized knee  ? system, a size 6N femur, 6 tibia, size 10 mm PS AOX insert, and 35 anatomic patellar  ? button.  ?   ? SURGEON:  Pietro Cassis. Alvan Dame, M.D.  ?   ? ASSISTANT:  Costella Hatcher, PA-C.  ?   ? ANESTHESIA:  Regional and Spinal.  ?   ? SPECIMENS:  None.  ?   ? COMPLICATION:  None.  ?   ? DRAINS:  None. ? ?EBL: <100 cc  ?   ? TOURNIQUET TIME:   ?Total Tourniquet Time Documented: ?Thigh (Left) - 31 minutes ?Total: Thigh (Left) - 31 minutes ? .  ?   ? The patient was stable to the recovery room.  ?   ? INDICATION FOR PROCEDURE:  Emily Sanchez is a 66 y.o. female patient of  ? mine.  The patient had been seen, evaluated, and treated for months conservatively in the  ? office with medication, activity modification, and injections.  The patient had  ? radiographic changes of bone-on-bone arthritis with endplate sclerosis and osteophytes noted.  Based on the radiographic changes and failed conservative measures, the patient  ? decided to proceed with definitive treatment, total knee replacement.  Risks of infection, DVT, component  failure, need for revision surgery, neurovascular injury were reviewed in the office setting.  The postop course was reviewed stressing the efforts to maximize post-operative satisfaction and function.  Consent was obtained for benefit of pain  ? relief.  ?   ? PROCEDURE IN DETAIL:  The patient was brought to the operative theater.  ? Once adequate anesthesia, preoperative antibiotics, 2 gm of Ancef,1 gm of Tranexamic Acid, and 10 mg of Decadron administered, the patient was positioned supine with a left thigh tourniquet placed.  The  ?left lower extremity was prepped and draped in sterile fashion.  A time-  ? out was performed identifying the patient, planned procedure, and the appropriate extremity.  ?   ? The left lower extremity was placed in the [facility] leg holder.  The leg was  ? exsanguinated, tourniquet elevated to 250 mmHg.  A midline incision was  ?  made followed by median parapatellar arthrotomy.  Following initial  ? exposure, attention was first directed to the patella.  Precut  ? measurement was noted to be 21 mm.  I resected down to 13-14 mm and used a  ? 35 anatomic patellar button to restore patellar height as well as cover the cut surface.  ?   ? The lug holes were drilled and a metal shim was placed to protect the  ? patella from retractors and saw blade during the procedure.  ?   ? At this point, attention was now directed to the femur.  The femoral  ? canal was opened with a drill, irrigated to try to prevent fat emboli.  An  ? intramedullary rod was passed at 3 degrees valgus, 9 mm of bone was  ? resected off the distal femur.  Following this resection, the tibia was  ? subluxated anteriorly.  Using the extramedullary guide, 2 mm of bone was resected off  ? the proximal medial tibia.  We confirmed the gap would be  ? stable medially and laterally with a size 6 spacer block as well as confirmed that the tibial cut was perpendicular in the coronal plane, checking with an alignment rod.  ?   ? Once  this was done, I sized the femur to be a size 6 in the anterior-  ? posterior dimension, chose a narrow component based on medial and  ? lateral dimension.  The size 6 rotation block was then pinned in  ? position anterior referenced using the C-clamp to set rotation.  The  ? anterior, posterior, and  chamfer cuts were made without difficulty nor  ? notching making certain that I was along the anterior cortex to help  ? with flexion gap stability.  ?   ? The final box cut was made off the lateral aspect of distal femur. I used laminar spreader to expose posterior aspect of the knee to removed posterior osteophytes. ?   ? At this point, the tibia was sized to be a size 6.  The size 6 tray was  ? then pinned in position through the medial third of the tubercle,  ? drilled, and keel punched.  Trial reduction was now carried with a 6 femur, ? 6 tibia, a size 10 mm PS insert, and the 35 anatomic patella botton.  The knee was brought to full extension with good flexion stability with the patella  ? tracking through the trochlea without application of pressure.  Given  ? all these findings the trial components removed.  Final components were  ? opened and cement was mixed.  The knee was irrigated with normal saline solution and pulse lavage.  The synovial lining was  ? then injected with 30 cc of 0.25% Marcaine with epinephrine, 1 cc of Toradol and 30 cc of NS for a total of 61 cc.  ?   ?Final implants were then cemented onto cleaned and dried cut surfaces of bone with the knee brought to extension with a size 10 mm PS trial insert.  ?   ? Once the cement had fully cured, excess cement was removed  ? throughout the knee.  I confirmed that I was satisfied with the range of  ? motion and stability, and the final size 10 mm PS AOX insert was chosen.  It was  ? placed into the knee.  ?   ? The tourniquet had been let down at 31 minutes.  No significant  ?  hemostasis was required.  The extensor mechanism was then reapproximated  using #1 Vicryl and #1 Stratafix sutures with the knee  ? in flexion.  The  ? remaining wound was closed with 2-0 Vicryl and running 4-0 Monocryl.  ? The knee was cleaned, dried, dressed sterilely using Dermabond and  ? Aquacel dressing.  The patient was then  ? brought to recovery room in stable condition, tolerating the procedure  ? well.  ? ?Please note that Physician Assistant, Costella Hatcher, PA-C was present for the entirety of the case, and was utilized for pre-operative positioning, peri-operative retractor management, general facilitation of the procedure and for primary wound closure at the end of the case. ?   ?   ?   ?   ? Pietro Cassis Alvan Dame, M.D.  ? ? ?07/15/2021 10:06 AM  ?

## 2021-07-15 NOTE — Anesthesia Procedure Notes (Signed)
Anesthesia Regional Block: Adductor canal block  ? ?Pre-Anesthetic Checklist: , timeout performed,  Correct Patient, Correct Site, Correct Laterality,  Correct Procedure, Correct Position, site marked,  Risks and benefits discussed,  Surgical consent,  Pre-op evaluation,  At surgeon's request and post-op pain management ? ?Laterality: Left ? ?Prep: chloraprep     ?  ?Needles:  ?Injection technique: Single-shot ? ?Needle Type: Echogenic Needle   ? ? ?Needle Length: 9cm  ? ? ? ? ?Additional Needles: ? ? ?Procedures:,,,, ultrasound used (permanent image in chart),,    ?Narrative:  ?Start time: 07/15/2021 7:56 AM ?End time: 07/15/2021 8:01 AM ?Injection made incrementally with aspirations every 5 mL. ? ?Performed by: Personally  ?Anesthesiologist: Myrtie Soman, MD ? ?Additional Notes: ?Patient tolerated the procedure well without complications ? ? ? ?

## 2021-07-15 NOTE — Anesthesia Procedure Notes (Signed)
Spinal ? ?Patient location during procedure: OR ?Start time: 07/15/2021 8:45 AM ?End time: 07/15/2021 8:48 AM ?Reason for block: surgical anesthesia ?Staffing ?Performed: resident/CRNA  ?Resident/CRNA: Sharlette Dense, CRNA ?Preanesthetic Checklist ?Completed: patient identified, IV checked, site marked, risks and benefits discussed, surgical consent, monitors and equipment checked, pre-op evaluation and timeout performed ?Spinal Block ?Patient position: sitting ?Prep: DuraPrep and site prepped and draped ?Patient monitoring: heart rate ?Approach: midline ?Location: L3-4 ?Injection technique: single-shot ?Needle ?Needle type: Pencan  ?Needle gauge: 24 G ?Needle length: 9 cm ?Additional Notes ?Kit expiration date 09/18/2022 and lot # [id number] ?Clear free flow CSF, negative heme, negative paresthesia ?Tolerated well and returned to supine position ? ? ? ?

## 2021-07-15 NOTE — Anesthesia Postprocedure Evaluation (Signed)
Anesthesia Post Note ? ?Patient: Emily Sanchez ? ?Procedure(s) Performed: TOTAL KNEE ARTHROPLASTY (Left: Knee) ? ?  ? ?Patient location during evaluation: PACU ?Anesthesia Type: Spinal ?Level of consciousness: oriented and awake and alert ?Pain management: pain level controlled ?Vital Signs Assessment: post-procedure vital signs reviewed and stable ?Respiratory status: spontaneous breathing, respiratory function stable and patient connected to nasal cannula oxygen ?Cardiovascular status: blood pressure returned to baseline and stable ?Postop Assessment: no headache, no backache and no apparent nausea or vomiting ?Anesthetic complications: no ? ? ?No notable events documented. ? ?Last Vitals:  ?Vitals:  ? 07/15/21 1115 07/15/21 1130  ?BP: (!) 165/99 (!) 169/102  ?Pulse: (!) 103 99  ?Resp: 16 13  ?Temp:    ?SpO2: 95% 96%  ?  ?Last Pain:  ?Vitals:  ? 07/15/21 1115  ?TempSrc:   ?PainSc: 5   ? ? ?  ?  ?  ?  ?  ?  ? ?Cray Monnin S ? ? ? ? ?

## 2021-07-16 ENCOUNTER — Encounter (HOSPITAL_COMMUNITY): Payer: Self-pay | Admitting: Orthopedic Surgery

## 2021-07-16 DIAGNOSIS — N183 Chronic kidney disease, stage 3 unspecified: Secondary | ICD-10-CM | POA: Diagnosis not present

## 2021-07-16 DIAGNOSIS — Z79899 Other long term (current) drug therapy: Secondary | ICD-10-CM | POA: Diagnosis not present

## 2021-07-16 DIAGNOSIS — Z96651 Presence of right artificial knee joint: Secondary | ICD-10-CM | POA: Diagnosis not present

## 2021-07-16 DIAGNOSIS — M1712 Unilateral primary osteoarthritis, left knee: Secondary | ICD-10-CM | POA: Diagnosis not present

## 2021-07-16 DIAGNOSIS — I129 Hypertensive chronic kidney disease with stage 1 through stage 4 chronic kidney disease, or unspecified chronic kidney disease: Secondary | ICD-10-CM | POA: Diagnosis not present

## 2021-07-16 DIAGNOSIS — Z87891 Personal history of nicotine dependence: Secondary | ICD-10-CM | POA: Diagnosis not present

## 2021-07-16 LAB — CBC
HCT: 34 % — ABNORMAL LOW (ref 36.0–46.0)
Hemoglobin: 11.1 g/dL — ABNORMAL LOW (ref 12.0–15.0)
MCH: 31 pg (ref 26.0–34.0)
MCHC: 32.6 g/dL (ref 30.0–36.0)
MCV: 95 fL (ref 80.0–100.0)
Platelets: 240 10*3/uL (ref 150–400)
RBC: 3.58 MIL/uL — ABNORMAL LOW (ref 3.87–5.11)
RDW: 14.8 % (ref 11.5–15.5)
WBC: 8 10*3/uL (ref 4.0–10.5)
nRBC: 0 % (ref 0.0–0.2)

## 2021-07-16 LAB — BASIC METABOLIC PANEL
Anion gap: 8 (ref 5–15)
BUN: 18 mg/dL (ref 8–23)
CO2: 22 mmol/L (ref 22–32)
Calcium: 8.6 mg/dL — ABNORMAL LOW (ref 8.9–10.3)
Chloride: 102 mmol/L (ref 98–111)
Creatinine, Ser: 1.05 mg/dL — ABNORMAL HIGH (ref 0.44–1.00)
GFR, Estimated: 59 mL/min — ABNORMAL LOW (ref >60)
Glucose, Bld: 141 mg/dL — ABNORMAL HIGH (ref 70–99)
Potassium: 3.9 mmol/L (ref 3.5–5.1)
Sodium: 132 mmol/L — ABNORMAL LOW (ref 135–145)

## 2021-07-16 MED ORDER — OXYCODONE HCL 5 MG PO TABS
5.0000 mg | ORAL_TABLET | ORAL | 0 refills | Status: DC | PRN
Start: 2021-07-16 — End: 2021-08-12

## 2021-07-16 MED ORDER — ASPIRIN 81 MG PO CHEW
81.0000 mg | CHEWABLE_TABLET | Freq: Two times a day (BID) | ORAL | 0 refills | Status: AC
Start: 2021-07-16 — End: 2021-08-13

## 2021-07-16 MED ORDER — CYCLOBENZAPRINE HCL 5 MG PO TABS: 5.0000 mg | ORAL_TABLET | Freq: Three times a day (TID) | ORAL | 0 refills | Status: DC | PRN

## 2021-07-16 MED ORDER — DOCUSATE SODIUM 100 MG PO CAPS: 100.0000 mg | ORAL_CAPSULE | Freq: Two times a day (BID) | ORAL | 0 refills | Status: DC

## 2021-07-16 MED ORDER — POLYETHYLENE GLYCOL 3350 17 G PO PACK: 17.0000 g | PACK | Freq: Every day | ORAL | 0 refills | Status: DC | PRN

## 2021-07-16 NOTE — Progress Notes (Signed)
Physical Therapy Treatment ?Patient Details ?Name: Emily Sanchez ?MRN: [id number] ?DOB: Dec 18, 1955 ?Today's Date: 07/16/2021 ? ? ?History of Present Illness Patient is 66 y.o. female s/p L-TKA on 07/15/21 with PMH significant for OA, HTN, GERD, depression, anxiety, anemia, L-TKA 2021, Crohn's with ileostomy. ? ?  ?PT Comments  ? ? Pt seen for the first of two sessions POD1. Pt sitting EOB upon entry, reviewed importance of mobilizing with staff for safety at this point during rehabilitation, pt verbalized understanding. Pt min guard for all mobility tasks including ambulation in hallway with RW. Provided and reviewed HEP and pt demonstrated safe and appropriate form though did report increase in pain. Pt will continue to benefit from skilled therapy to address functional limitations and allow for safe discharge to the destination indicated below. We will continue to follow her acutely. ?   ?Recommendations for follow up therapy are one component of a multi-disciplinary discharge planning process, led by the attending physician.  Recommendations may be updated based on patient status, additional functional criteria and insurance authorization. ? ?Follow Up Recommendations ? Follow physician's recommendations for discharge plan and follow up therapies ?  ?  ?Assistance Recommended at Discharge Intermittent Supervision/Assistance  ?Patient can return home with the following A little help with walking and/or transfers;A little help with bathing/dressing/bathroom;Assistance with cooking/housework;Assist for transportation;Help with stairs or ramp for entrance ?  ?Equipment Recommendations ? None recommended by PT (pt has recommended DME)  ?  ?Recommendations for Other Services   ? ? ?  ?Precautions / Restrictions Precautions ?Precautions: Fall ?Restrictions ?Weight Bearing Restrictions: No ?LLE Weight Bearing: Weight bearing as tolerated ?Other Position/Activity Restrictions: WBAT  ?  ? ?Mobility ? Bed Mobility ?  ?   ?  ?  ?  ?  ?  ?General bed mobility comments: Pt sitting EOB upon arrival ?  ? ?Transfers ?Overall transfer level: Needs assistance ?Equipment used: Rolling walker (2 wheels) ?Transfers: Sit to/from Stand ?Sit to Stand: Min guard ?  ?  ?  ?  ?  ?General transfer comment: Pt min guard for safety and line management only, no physical assist required. VC for sequencing and hand placement. ?  ? ?Ambulation/Gait ?Ambulation/Gait assistance: Min guard ?Gait Distance (Feet): 120 Feet ?Assistive device: Rolling walker (2 wheels) ?Gait Pattern/deviations: Step-to pattern, Decreased step length - left, Decreased step length - right, Decreased stance time - left, Step-through pattern ?Gait velocity: decreased ?  ?  ?General Gait Details: Pt ambulated ~168f in hallway with min guard for safety only, no physical assist required. Pt demonstrating improved gait pattern today with better weight shift over LLE and use of BUE on RW. VC for sequencing and proximity to device. No overt LOB noted. ? ? ?Stairs ?  ?  ?  ?  ?  ? ? ?Wheelchair Mobility ?  ? ?Modified Rankin (Stroke Patients Only) ?  ? ? ?  ?Balance Overall balance assessment: Needs assistance ?Sitting-balance support: Feet supported, No upper extremity supported ?Sitting balance-Leahy Scale: Fair ?  ?  ?Standing balance support: Reliant on assistive device for balance, During functional activity, Bilateral upper extremity supported ?Standing balance-Leahy Scale: Poor ?  ?  ?  ?  ?  ?  ?  ?  ?  ?  ?  ?  ?  ? ?  ?Cognition Arousal/Alertness: Awake/alert ?[facility] ?Overall Cognitive Status: Within Functional Limits for tasks assessed ?  ?  ?  ?  ?  ?  ?  ?  ?  ?  ?  ?  ?  ?  ?  ?  ?  ?  ?  ? ?  ?  Exercises Total Joint Exercises ?Ankle Circles/Pumps: AROM, Both, 5 reps ?Quad Sets: AROM, Left, 5 reps ?Heel Slides: AROM, Left, 10 reps ? ?  ?General Comments General comments (skin integrity, edema, etc.): Reviewed importance of  having staff in room during mobility for safety, pt verbalized understanding. ?  ?  ? ?Pertinent Vitals/Pain Pain Assessment ?Pain Assessment: 0-10 ?Pain Score: 9  ?Pain Location: left knee ?Pain Descriptors / Indicators: Operative site guarding, Grimacing, Discomfort ?Pain Intervention(s): Ice applied, Repositioned, Monitored during session, Limited activity within patient's tolerance  ? ? ?Home Living   ?  ?  ?  ?  ?  ?  ?  ?  ?  ?   ?  ?Prior Function    ?  ?  ?   ? ?PT Goals (current goals can now be found in the care plan section) Acute Rehab PT Goals ?Patient Stated Goal: To go home ?PT Goal Formulation: With patient/family ?Time For Goal Achievement: 07/22/21 ?Potential to Achieve Goals: Good ?Progress towards PT goals: Progressing toward goals ? ?  ?Frequency ? ? ? 7X/week ? ? ? ?  ?PT Plan Current plan remains appropriate  ? ? ?Co-evaluation   ?  ?  ?  ?  ? ?  ?AM-PAC PT "6 Clicks" Mobility   ?Outcome Measure ? Help needed turning from your back to your side while in a flat bed without using bedrails?: A Little ?Help needed moving from lying on your back to sitting on the side of a flat bed without using bedrails?: A Little ?Help needed moving to and from a bed to a chair (including a wheelchair)?: A Little ?Help needed standing up from a chair using your arms (e.g., wheelchair or bedside chair)?: A Little ?Help needed to walk in hospital room?: A Little ?Help needed climbing 3-5 steps with a railing? : A Little ?6 Click Score: 18 ? ?  ?End of Session Equipment Utilized During Treatment: Gait belt ?Activity Tolerance: Patient tolerated treatment well ?Patient left: in chair;with call bell/phone within reach;with chair alarm set ?Nurse Communication: Mobility status ?PT Visit Diagnosis: Difficulty in walking, not elsewhere classified (R26.2);Pain ?Pain - Right/Left: Left ?Pain - part of body: Knee ?  ? ? ?Time: 8527-7824 ?PT Time Calculation (min) (ACUTE ONLY): 38 min ? ?Charges:  $Gait Training: 8-22  mins ?$Therapeutic Exercise: 8-22 mins          ?          ? ?Coolidge Breeze, PT, DPT ?[facility] Department ?Office: [phone] ?Pager: [phone] ? ? ?Coolidge Breeze ?07/16/2021, 10:59 AM ? ?

## 2021-07-16 NOTE — TOC Initial Note (Signed)
Transition of Care (TOC) - Initial/Assessment Note  ? ? ?Patient Details  ?Name: Emily Sanchez ?MRN: [id number] ?Date of Birth: February 04, 1956 ? ?Transition of Care (TOC) CM/SW Contact:    ?Tawanna Cooler, RN ?Phone Number: ?07/16/2021, 1:37 PM ? ?Clinical Narrative:                 ? ?Spoke with patient at bedside.  She does live home alone, but states she will either be staying with her sister or daughter while she recovers.  She states thtat she has all the DME that she needs, but will ask her daughter to double check.   ?Per surgery plan is outpatient PT and patient confirms.   ? ? ?Expected Discharge Plan: Home/Self Care ?  ? ?Expected Discharge Plan and Services ?Expected Discharge Plan: Home/Self Care ?  ?  ?Living arrangements for the past 2 months: [place] ?Expected Discharge Date: 07/16/21               ?DME Arranged: N/A ?DME Agency: NA ?  ?   ?HH Arranged: NA ?[place] Agency: NA ?  ?  ?Prior Living Arrangements/Services ?Living arrangements for the past 2 months: [place] ?Lives with:: Self ?Patient language and need for interpreter reviewed:: Yes ?Do you feel safe going back to the place where you live?: Yes      ?Need for Family Participation in Patient Care: Yes (Comment) ?Care giver support system in place?: Yes (comment) ?  ?Criminal Activity/Legal Involvement Pertinent to Current Situation/Hospitalization: No - Comment as needed ? ?Activities of Daily Living ?Home Assistive Devices/Equipment: Dentures (specify type), Wheelchair, Grab bars in shower, Bedside commode/3-in-1 ?ADL Screening (condition at time of admission) ?[name] cognitive ability adequate to safely complete daily activities?: Yes ?Is the patient deaf or have difficulty hearing?: No ?Does the patient have difficulty seeing, even when wearing glasses/contacts?: No ?Does the patient have difficulty concentrating, remembering, or making decisions?: No ?Patient able to express need for assistance with ADLs?:  Yes ?Does the patient have difficulty dressing or bathing?: No ?Independently performs ADLs?: Yes (appropriate for developmental age) ?Does the patient have difficulty walking or climbing stairs?: Yes ?Weakness of Legs: Left ?Weakness of Arms/Hands: None ? ?Emotional Assessment ?Appearance:: Appears stated age ?Attitude/Demeanor/Rapport: Engaged ?Affect (typically observed): Calm ?Orientation: : Oriented to Self, Oriented to Place, Oriented to  Time, Oriented to Situation ?Alcohol / Substance Use: Not Applicable ?Psych Involvement: No (comment) ? ?Admission diagnosis:  S/P total knee arthroplasty, left [Z96.652] ?Patient Active Problem List  ? Diagnosis Date Noted  ? S/P total knee arthroplasty, left 07/15/2021  ? Obese 01/17/2020  ? S/P right TKA 01/16/2020  ? Right knee OA 01/16/2020  ? S/P knee replacement 01/16/2020  ? Preop exam for internal medicine 01/09/2020  ? 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 Gout attack 08/18/2017  ? Abdominal wall abscess at site of surgical wound   ? Surgical site infection 06/22/2017  ? SBO (small bowel obstruction) ([place]) 06/08/2017  ? Fracture of multiple ribs of left side 09/03/2015  ? Liver laceration 09/03/2015  ? Adrenal hemorrhage ([place]) 09/03/2015  ? AKI (acute kidney injury) ([place]) 09/03/2015  ? Kidney laceration, right 08/31/2015  ? 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 Status post total colectomy and ileostomy for Crohn's 03/21/2015  ? Ventral hernia 03/21/2015  ? Well adult exam 02/27/2015  ? Allergic rhinitis 08/16/2014  ? Enteritis due to Clostridium difficile 08/16/2012  ? Nausea 08/16/2012  ? Angular stomatitis 03/30/2012  ?  Anemia, chronic disease 04/08/2011  ? CRI (chronic renal insufficiency) 04/08/2011  ? Osteoarthritis 06/18/2009  ? PNEUMONIA 11/06/2008  ? Fibromyalgia syndrome 08/13/2008  ? INSOMNIA, PERSISTENT 10/18/2007  ? Chronic fatigue syndrome 10/18/2007  ? Vitamin D deficiency 07/15/2007  ? Anxiety disorder  02/17/2007  ? GERD 02/17/2007  ? LOW BACK PAIN 02/17/2007  ? B12 deficiency 11/08/2006  ? PANIC ATTACK 11/08/2006  ? DEPRESSION 11/08/2006  ? Essential hypertension 11/08/2006  ? Crohn's disease ([place]) 11/08/2006  ? ?PCP:  Plotnikov, Evie Lacks, MD ?Pharmacy:   ?[id number] [place] [id number]?Phone: [phone]: [phone]? ?[facility] [address] [id number]?Phone: [phone]: [phone]? ?

## 2021-07-16 NOTE — Progress Notes (Signed)
Physical Therapy Treatment ?Patient Details ?Name: Emily Sanchez ?MRN: [id number] ?DOB: Oct 21, 1955 ?Today's Date: 07/16/2021 ? ? ?History of Present Illness Patient is 66 y.o. female s/p L-TKA on 07/15/21 with PMH significant for OA, HTN, GERD, depression, anxiety, anemia, L-TKA 2021, Crohn's with ileostomy. ? ?  ?PT Comments  ? ? Pt seen for the second of two sessions today. Pt min guard to supervision for all mobility tasks today.  Patient educated on safe sequencing for stair mobility and verbalized safe guarding position for people assisting with mobility. Patient instructed in exercises to facilitate ROM and circulation. Patient will benefit from continued skilled PT interventions to address impairments and progress towards PLOF. Patient has met mobility goals at adequate level for discharge home; will continue to follow if pt continues acute stay to progress towards Mod I goals. ?  ?Recommendations for follow up therapy are one component of a multi-disciplinary discharge planning process, led by the attending physician.  Recommendations may be updated based on patient status, additional functional criteria and insurance authorization. ? ?Follow Up Recommendations ? Follow physician's recommendations for discharge plan and follow up therapies ?  ?  ?Assistance Recommended at Discharge Intermittent Supervision/Assistance  ?Patient can return home with the following A little help with walking and/or transfers;A little help with bathing/dressing/bathroom;Assistance with cooking/housework;Assist for transportation;Help with stairs or ramp for entrance ?  ?Equipment Recommendations ? None recommended by PT (pt has recommended DME)  ?  ?Recommendations for Other Services   ? ? ?  ?Precautions / Restrictions Precautions ?Precautions: Fall ?Restrictions ?Weight Bearing Restrictions: No ?Other Position/Activity Restrictions: WBAT  ?  ? ?Mobility ? Bed Mobility ?  ?  ?  ?  ?  ?  ?  ?General bed mobility comments: Pt  sitting EOB upon arrival ?  ? ?Transfers ?Overall transfer level: Needs assistance ?Equipment used: Rolling walker (2 wheels) ?Transfers: Sit to/from Stand ?Sit to Stand: Supervision ?  ?  ?  ?  ?  ?General transfer comment: Supervision for safety only ?  ? ?Ambulation/Gait ?Ambulation/Gait assistance: Supervision ?Gait Distance (Feet): 40 Feet ?Assistive device: Rolling walker (2 wheels) ?Gait Pattern/deviations: Decreased step length - left, Decreased stance time - left, Step-through pattern ?Gait velocity: decreased ?  ?  ?General Gait Details: Pt ambulated 62f with RW in hallway to stairs with supervision only. Pt demonstrated step-through pattern with good fluidity of gait, No overt LOB noted. ? ? ?Stairs ?Stairs: Yes ?Stairs assistance: Min guard ?Stair Management: One rail Left, Step to pattern, Forwards ?Number of Stairs: 10 ?General stair comments: Pt instructed on safe and appropriate stair technique and handout provided, pt verbalized understanding. Discussed appropriate guarding from caregiver and pt was able to teach back safe stair ascent and descent with caregiver assistance. Pt completed stair training with 1 rail on the left and step-to pattern, VC for sequencing and hand placement and turning hips toward railing. No overt LOB. ? ? ?Wheelchair Mobility ?  ? ?Modified Rankin (Stroke Patients Only) ?  ? ? ?  ?Balance Overall balance assessment: Needs assistance ?Sitting-balance support: Feet supported, No upper extremity supported ?Sitting balance-Leahy Scale: Fair ?  ?  ?Standing balance support: Reliant on assistive device for balance, During functional activity, Bilateral upper extremity supported ?Standing balance-Leahy Scale: Poor ?  ?  ?  ?  ?  ?  ?  ?  ?  ?  ?  ?  ?  ? ?  ?Cognition Arousal/Alertness: Awake/alert ?[facility] ?Overall Cognitive Status:  Within Functional Limits for tasks assessed ?  ?  ?  ?  ?  ?  ?  ?  ?  ?  ?  ?  ?  ?  ?  ?  ?  ?   ?  ? ?  ?Exercises Total Joint Exercises ?Ankle Circles/Pumps: AROM, Both, 5 reps ?Quad Sets: AROM, Left, 5 reps ?Heel Slides: AROM, Left, 10 reps ?Other Exercises ?Other Exercises: Reviewed HEP and appropriate progression verbally, pt generally understands safe technique and form. ? ?  ?General Comments General comments (skin integrity, edema, etc.): Reviewed safe mobilization with pt: calling nurse to move about room, use bathroom etc. and pt verbalized understanding but upon return to room with ice at end of session pt found standing at bedside organizing luggage. Pt apologetic but continues to mobilize without staff assistance. ?  ?  ? ?Pertinent Vitals/Pain Pain Assessment ?Pain Assessment: 0-10 ?Pain Score: 8  ?Pain Location: left knee ?Pain Descriptors / Indicators: Operative site guarding, Grimacing, Discomfort ?Pain Intervention(s): Ice applied, Repositioned, Monitored during session  ? ? ?Home Living   ?  ?  ?  ?  ?  ?  ?  ?  ?  ?   ?  ?Prior Function    ?  ?  ?   ? ?PT Goals (current goals can now be found in the care plan section) Acute Rehab PT Goals ?Patient Stated Goal: To go home ?PT Goal Formulation: With patient/family ?Time For Goal Achievement: 07/22/21 ?Potential to Achieve Goals: Good ?Progress towards PT goals: Progressing toward goals ? ?  ?Frequency ? ? ? 7X/week ? ? ? ?  ?PT Plan Current plan remains appropriate  ? ? ?Co-evaluation   ?  ?  ?  ?  ? ?  ?AM-PAC PT "6 Clicks" Mobility   ?Outcome Measure ? Help needed turning from your back to your side while in a flat bed without using bedrails?: None ?Help needed moving from lying on your back to sitting on the side of a flat bed without using bedrails?: None ?Help needed moving to and from a bed to a chair (including a wheelchair)?: A Little ?Help needed standing up from a chair using your arms (e.g., wheelchair or bedside chair)?: A Little ?Help needed to walk in hospital room?: A Little ?Help needed climbing 3-5 steps with a railing? : A  Little ?6 Click Score: 20 ? ?  ?End of Session Equipment Utilized During Treatment: Gait belt ?Activity Tolerance: Patient tolerated treatment well ?Patient left: in chair;with call bell/phone within reach;with chair alarm set ?Nurse Communication: Mobility status ?PT Visit Diagnosis: Difficulty in walking, not elsewhere classified (R26.2);Pain ?Pain - Right/Left: Left ?Pain - part of body: Knee ?  ? ? ?Time: 1017-5102 ?PT Time Calculation (min) (ACUTE ONLY): 32 min ? ?Charges:  $Gait Training: 8-22 mins ?$Self Care/Home Management: 8-22          ?          ? ?Coolidge Breeze, PT, DPT ?[facility] ?Office: [phone] ?Pager: [phone] ? ? ?Coolidge Breeze ?07/16/2021, 4:24 PM ? ?

## 2021-07-16 NOTE — Progress Notes (Signed)
? ?Subjective: ?1 Day Post-Op Procedure(s) (LRB): ?TOTAL KNEE ARTHROPLASTY (Left) ?Patient reports pain as mild.   ?Patient seen in rounds with Dr. Alvan Dame. ?Patient is well, and has had no acute complaints or problems. No acute events overnight. She reports it was a tough night due to pain. Foley catheter removed. Patient ambulated 5 feet with PT.  ?We will start therapy today.  ? ?Objective: ?Vital signs in last 24 hours: ?Temp:  [97.5 ?F (36.4 ?C)-97.9 ?F (36.6 ?C)] 97.7 ?F (36.5 ?C) (03/29 0517) ?Pulse Rate:  [84-108] 86 (03/29 0517) ?Resp:  [13-22] 18 (03/29 0517) ?BP: (147-178)/(86-107) 153/91 (03/29 0517) ?SpO2:  [95 %-100 %] 100 % (03/29 0517) ?Weight:  [89.8 kg] 89.8 kg (03/28 1258) ? ?Intake/Output from previous day: ? ?Intake/Output Summary (Last 24 hours) at 07/16/2021 0805 ?Last data filed at 07/16/2021 0500 ?Gross per 24 hour  ?Intake 3316.25 ml  ?Output 1950 ml  ?Net 1366.25 ml  ?  ? ?Intake/Output this shift: ?No intake/output data recorded. ? ?Labs: ?Recent Labs  ?  07/16/21 ?0322  ?HGB 11.1*  ? ?Recent Labs  ?  07/16/21 ?0322  ?WBC 8.0  ?RBC 3.58*  ?HCT 34.0*  ?PLT 240  ? ?Recent Labs  ?  07/16/21 ?0322  ?NA 132*  ?K 3.9  ?CL 102  ?CO2 22  ?BUN 18  ?CREATININE 1.05*  ?GLUCOSE 141*  ?CALCIUM 8.6*  ? ?No results for input(s): LABPT, INR in the last 72 hours. ? ?Exam: ?General - Patient is Alert and Oriented ?Extremity - Neurologically intact ?Sensation intact distally ?Intact pulses distally ?Dorsiflexion/Plantar flexion intact ?Dressing - dressing C/D/I ?Motor Function - intact, moving foot and toes well on exam.  ? ?Past Medical History:  ?Diagnosis Date  ? Anemia   ? Anxiety   ? Chronic kidney disease   ? stage 3 - [facility]   ? Crohn's disease ([place])   ? Depression   ? Enteritis due to Clostridium difficile 08/16/2012  ? 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 GERD (gastroesophageal reflux disease)   ? HTN (hypertension)   ? lst blood pressure medication was 3 months ago   ? Ileostomy in place  [facility])   ? Low back pain   ? FMS  ? Osteoarthritis   ? Dr Alvan Dame; both knees  ? Perianal pain   ? Chronic, Post-Op  ? Transfusion history   ? none recent  ? Ulcerative colitis ([place])   ? Dr Sharlett Iles  ? ? ?Assessment/Plan: ?1 Day Post-Op Procedure(s) (LRB): ?TOTAL KNEE ARTHROPLASTY (Left) ?Principal Problem: ?  S/P total knee arthroplasty, left ? ?Estimated body mass index is 32.94 kg/m? as calculated from the following: ?  Height as of this encounter: 5' 5"  (1.651 m). ?  Weight as of this encounter: 89.8 kg. ?Advance diet ?Up with therapy ?D/C IV fluids ? ? ?[name] anticipated LOS is less than 2 midnights, meeting these requirements: ?- Younger than 61 ?- Lives within 1 hour of care ?- Has a competent adult at home to recover with post-op recover ?- NO history of ? - Chronic pain requiring opiods ? - Diabetes ? - Coronary Artery Disease ? - Heart failure ? - Heart attack ? - Stroke ? - DVT/VTE ? - Cardiac arrhythmia ? - Respiratory Failure/COPD ? - Renal failure ? - Anemia ? - Advanced Liver disease ? ?  ? ?DVT Prophylaxis - Aspirin ?Weight bearing as tolerated. ? ?Hgb stable at 11.1 this AM. ?Cr. 1.05, known CKD. Not on NSAIDs. ? ?Plan is  to go Home after hospital stay. Plan for discharge today following 1-2 sessions of PT as long as they are meeting their goals. Patient is scheduled for OPPT. Follow up in the office in 2 weeks.  ? ?Griffith Citron, PA-C ?Orthopedic Surgery ?[phone] ?07/16/2021, 8:05 AM  ?

## 2021-07-17 ENCOUNTER — Telehealth: Payer: Self-pay | Admitting: Internal Medicine

## 2021-07-17 NOTE — Telephone Encounter (Signed)
N/A unable to leave a message for patient to call back to schedule Medicare Annual Wellness Visit  ? ?Last AWV  02/16/17 ? ?Please schedule at anytime with [facility] if patient calls the office back.   ? ? ? ?Any questions, please call me at [phone]  ?

## 2021-07-24 DIAGNOSIS — M25562 Pain in left knee: Secondary | ICD-10-CM | POA: Diagnosis not present

## 2021-07-24 DIAGNOSIS — M25662 Stiffness of left knee, not elsewhere classified: Secondary | ICD-10-CM | POA: Diagnosis not present

## 2021-07-25 NOTE — Discharge Summary (Signed)
Patient ID: ?Emily Sanchez ?MRN: [id number] ?DOB/AGE: March 26, 1956 66 y.o. ? ?Admit date: 07/15/2021 ?Discharge date: 07/16/2021 ? ?Admission Diagnoses:  ?Left knee osteoarthritis ? ?Discharge Diagnoses:  ?Principal Problem: ?  S/P total knee arthroplasty, left ? ? ?Past Medical History:  ?Diagnosis Date  ? Anemia   ? Anxiety   ? Chronic kidney disease   ? stage 3 - [facility]   ? Crohn's disease ([place])   ? Depression   ? Enteritis due to Clostridium difficile 08/16/2012  ? 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 GERD (gastroesophageal reflux disease)   ? HTN (hypertension)   ? lst blood pressure medication was 3 months ago   ? Ileostomy in place [facility])   ? Low back pain   ? FMS  ? Osteoarthritis   ? Dr Alvan Dame; both knees  ? Perianal pain   ? Chronic, Post-Op  ? Transfusion history   ? none recent  ? Ulcerative colitis ([place])   ? Dr Sharlett Iles  ? ? ?Surgeries: Procedure(s): ?TOTAL KNEE ARTHROPLASTY on 07/15/2021 ?  ?Consultants:  ? ?Discharged Condition: Improved ? ?Hospital Course: Emily Sanchez is an 66 y.o. female who was admitted 07/15/2021 for operative treatment ofS/P total knee arthroplasty, left. Patient has severe unremitting pain that affects sleep, daily activities, and work/hobbies. After pre-op clearance the patient was taken to the operating room on 07/15/2021 and underwent  Procedure(s): ?TOTAL KNEE ARTHROPLASTY.   ? ?Patient was given perioperative antibiotics:  ?Anti-infectives (From admission, onward)  ? ? Start     Dose/Rate Route Frequency Ordered Stop  ? 07/15/21 1500  ceFAZolin (ANCEF) IVPB 2g/100 mL premix       ? 2 g ?200 mL/hr over 30 Minutes Intravenous Every 6 hours 07/15/21 1258 07/16/21 1220  ? 07/15/21 0600  ceFAZolin (ANCEF) IVPB 2g/100 mL premix       ? 2 g ?200 mL/hr over 30 Minutes Intravenous On call to O.R. 07/15/21 0538 07/16/21 1223  ? ?  ?  ? ?Patient was given sequential compression devices, early ambulation, and chemoprophylaxis to prevent DVT. Patient worked with PT  and was meeting their goals regarding safe ambulation and transfers. ? ?Patient benefited maximally from hospital stay and there were no complications.   ? ?Recent vital signs: No data found.  ? ?Recent laboratory studies: No results for input(s): WBC, HGB, HCT, PLT, NA, K, CL, CO2, BUN, CREATININE, GLUCOSE, INR, CALCIUM in the last 72 hours. ? ?Invalid input(s): PT, 2 ? ? ?Discharge Medications:   ?Allergies as of 07/16/2021   ? ?   Reactions  ? Erythromycin Base Nausea And Vomiting  ? Amphetamine-dextroamphet Er Nausea And Vomiting  ? Cymbalta [duloxetine Hcl] Other (See Comments)  ? "Made me sleepy."  ? Erythromycin Ethylsuccinate Nausea And Vomiting  ? Morphine Nausea And Vomiting  ? ?  ? ?  ?Medication List  ?  ? ?STOP taking these medications   ? ?phentermine 37.5 MG tablet ?Commonly known as: ADIPEX-P ?  ?traMADol 50 MG tablet ?Commonly known as: ULTRAM ?  ? ?  ? ?TAKE these medications   ? ?acetaminophen 500 MG tablet ?Commonly known as: TYLENOL ?Take 2 tablets (1,000 mg total) by mouth every 8 (eight) hours. ?What changed:  ?when to take this ?reasons to take this ?  ?aspirin 81 MG chewable tablet ?Chew 1 tablet (81 mg total) by mouth 2 (two) times daily for 28 days. ?  ?BD Integra Syringe 25G X 1" 3 ML Misc ?Generic drug: SYRINGE-NEEDLE (DISP) 3  ML ?TO BE USED FOR B12 INJECTIONS ?  ?cyanocobalamin 1000 MCG/ML injection ?Commonly known as: (VITAMIN B-12) ?Inject 1 mL (1,000 mcg total) into the muscle every 14 (fourteen) days. ?  ?cyclobenzaprine 5 MG tablet ?Commonly known as: FLEXERIL ?Take 1 tablet (5 mg total) by mouth 3 (three) times daily as needed for muscle spasms. ?What changed:  ?how much to take ?how to take this ?when to take this ?reasons to take this ?additional instructions ?  ?docusate sodium 100 MG capsule ?Commonly known as: COLACE ?Take 1 capsule (100 mg total) by mouth 2 (two) times daily. ?  ?oxyCODONE 5 MG immediate release tablet ?Commonly known as: Oxy IR/ROXICODONE ?Take 1-2 tablets  (5-10 mg total) by mouth every 4 (four) hours as needed for severe pain. ?  ?pantoprazole 40 MG tablet ?Commonly known as: PROTONIX ?Take 1 tablet (40 mg total) by mouth daily. ?What changed:  ?when to take this ?reasons to take this ?  ?polyethylene glycol 17 g packet ?Commonly known as: MIRALAX / GLYCOLAX ?Take 17 g by mouth daily as needed for mild constipation. ?  ?Vitamin D (Ergocalciferol) 1.25 MG (50000 UNIT) Caps capsule ?Commonly known as: DRISDOL ?Take 1 capsule (50,000 Units total) by mouth every 7 (seven) days. ?  ?Vitamin D3 50 MCG (2000 UT) capsule ?Take 1 capsule (2,000 Units total) by mouth daily. ?  ? ?  ? ?  ?  ? ? ?  ?Discharge Care Instructions  ?(From admission, onward)  ?  ? ? ?  ? ?  Start     Ordered  ? 07/16/21 0000  Change dressing       ?Comments: Maintain surgical dressing until follow up in the clinic. If the edges start to pull up, may reinforce with tape. If the dressing is no longer working, may remove and cover with gauze and tape, but must keep the area dry and clean.  Call with any questions or concerns.  ? 07/16/21 0807  ? ?  ?  ? ?  ? ? ?Diagnostic Studies: No results found. ? ?Disposition: Discharge disposition: 01-Home or Self Care ? ? ? ? ? ? ?Discharge Instructions   ? ? Call MD / Call 911   Complete by: As directed ?  ? If you experience chest pain or shortness of breath, CALL 911 and be transported to the hospital emergency room.  If you develope a fever above 101 F, pus (white drainage) or increased drainage or redness at the wound, or calf pain, call your surgeon's office.  ? Change dressing   Complete by: As directed ?  ? Maintain surgical dressing until follow up in the clinic. If the edges start to pull up, may reinforce with tape. If the dressing is no longer working, may remove and cover with gauze and tape, but must keep the area dry and clean.  Call with any questions or concerns.  ? Constipation Prevention   Complete by: As directed ?  ? Drink plenty of fluids.   Prune juice may be helpful.  You may use a stool softener, such as Colace (over the counter) 100 mg twice a day.  Use MiraLax (over the counter) for constipation as needed.  ? Diet - low sodium heart healthy   Complete by: As directed ?  ? Increase activity slowly as tolerated   Complete by: As directed ?  ? Weight bearing as tolerated with assist device (walker, cane, etc) as directed, use it as long as suggested by your surgeon  or therapist, typically at least 4-6 weeks.  ? Post-operative opioid taper instructions:   Complete by: As directed ?  ? POST-OPERATIVE OPIOID TAPER INSTRUCTIONS: ?It is important to wean off of your opioid medication as soon as possible. If you do not need pain medication after your surgery it is ok to stop day one. ?Opioids include: ?Codeine, Hydrocodone(Norco, Vicodin), Oxycodone(Percocet, oxycontin) and hydromorphone amongst others.  ?Long term and even short term use of opiods can cause: ?Increased pain response ?Dependence ?Constipation ?Depression ?Respiratory depression ?And more.  ?Withdrawal symptoms can include ?Flu like symptoms ?Nausea, vomiting ?And more ?Techniques to manage these symptoms ?Hydrate well ?Eat regular healthy meals ?Stay active ?Use relaxation techniques(deep breathing, meditating, yoga) ?Do Not substitute Alcohol to help with tapering ?If you have been on opioids for less than two weeks and do not have pain than it is ok to stop all together.  ?Plan to wean off of opioids ?This plan should start within one week post op of your joint replacement. ?Maintain the same interval or time between taking each dose and first decrease the dose.  ?Cut the total daily intake of opioids by one tablet each day ?Next start to increase the time between doses. ?The last dose that should be eliminated is the evening dose.  ? ?  ? TED hose   Complete by: As directed ?  ? Use stockings (TED hose) for 2 weeks on both leg(s).  You may remove them at night for sleeping.  ? ?  ? ? ?  Follow-up Information   ? ? Irving Copas, PA-C. Go on 07/30/2021.   ?Specialty: Orthopedic Surgery ?Why: You are scheduled for a follow up appointment on 07-30-21 at 2:45 pm. ?Contact information: ?[phone]

## 2021-07-28 ENCOUNTER — Ambulatory Visit (HOSPITAL_COMMUNITY)
Admission: RE | Admit: 2021-07-28 | Discharge: 2021-07-28 | Disposition: A | Discharge status: Home / Self Care | Payer: Medicare Other | Source: Ambulatory Visit | Attending: Orthopedic Surgery | Admitting: Orthopedic Surgery

## 2021-07-28 ENCOUNTER — Other Ambulatory Visit (HOSPITAL_COMMUNITY): Payer: Self-pay | Admitting: Orthopedic Surgery

## 2021-07-28 DIAGNOSIS — M7989 Other specified soft tissue disorders: Secondary | ICD-10-CM | POA: Insufficient documentation

## 2021-07-28 DIAGNOSIS — M79605 Pain in left leg: Secondary | ICD-10-CM

## 2021-07-28 NOTE — Progress Notes (Signed)
Left lower extremity venous duplex has been completed. ?Preliminary results can be found in CV Proc through chart review.  ?Results were given to [facility] at Dr. Aurea Graff office. ? ?07/28/21 1:05 PM ?Carlos Levering RVT   ?

## 2021-07-29 DIAGNOSIS — M25562 Pain in left knee: Secondary | ICD-10-CM | POA: Diagnosis not present

## 2021-07-29 DIAGNOSIS — M25662 Stiffness of left knee, not elsewhere classified: Secondary | ICD-10-CM | POA: Diagnosis not present

## 2021-08-04 DIAGNOSIS — M25662 Stiffness of left knee, not elsewhere classified: Secondary | ICD-10-CM | POA: Diagnosis not present

## 2021-08-04 DIAGNOSIS — M25562 Pain in left knee: Secondary | ICD-10-CM | POA: Diagnosis not present

## 2021-08-08 DIAGNOSIS — M25662 Stiffness of left knee, not elsewhere classified: Secondary | ICD-10-CM | POA: Diagnosis not present

## 2021-08-08 DIAGNOSIS — M25562 Pain in left knee: Secondary | ICD-10-CM | POA: Diagnosis not present

## 2021-08-12 ENCOUNTER — Ambulatory Visit (INDEPENDENT_AMBULATORY_CARE_PROVIDER_SITE_OTHER): Payer: Medicare Other | Admitting: Internal Medicine

## 2021-08-12 ENCOUNTER — Encounter: Payer: Self-pay | Admitting: Internal Medicine

## 2021-08-12 DIAGNOSIS — M109 Gout, unspecified: Secondary | ICD-10-CM | POA: Diagnosis not present

## 2021-08-12 DIAGNOSIS — E559 Vitamin D deficiency, unspecified: Secondary | ICD-10-CM

## 2021-08-12 DIAGNOSIS — N1832 Chronic kidney disease, stage 3b: Secondary | ICD-10-CM | POA: Diagnosis not present

## 2021-08-12 DIAGNOSIS — Z96652 Presence of left artificial knee joint: Secondary | ICD-10-CM | POA: Diagnosis not present

## 2021-08-12 DIAGNOSIS — F419 Anxiety disorder, unspecified: Secondary | ICD-10-CM | POA: Diagnosis not present

## 2021-08-12 MED ORDER — VITAMIN D (ERGOCALCIFEROL) 1.25 MG (50000 UNIT) PO CAPS: 50000.0000 [IU] | ORAL_CAPSULE | ORAL | 3 refills | Status: DC

## 2021-08-12 MED ORDER — MELOXICAM 7.5 MG PO TABS: 7.5000 mg | ORAL_TABLET | Freq: Every day | ORAL | 0 refills | Status: DC

## 2021-08-12 NOTE — Patient Instructions (Signed)
Blue-Emu cream was -- use 2-3 times a day ? ?

## 2021-08-12 NOTE — Progress Notes (Signed)
? ?Subjective:  ?Patient ID: Celene Squibb, female    DOB: 27-Jul-1955  Age: 66 y.o. MRN: [id number] ? ?CC: Follow-up ? ? ?HPI ?Celene Squibb presents for L knee TKR on 07/15/21 by Dr Alvan Dame - severe pain, fluid accumulation. ?F/u on CRI, gout ? ? ?Outpatient Medications Prior to Visit  ?Medication Sig Dispense Refill  ? acetaminophen (TYLENOL) 500 MG tablet Take 2 tablets (1,000 mg total) by mouth every 8 (eight) hours. (Patient taking differently: Take 1,000 mg by mouth every 6 (six) hours as needed for moderate pain.) 30 tablet 0  ? aspirin 81 MG chewable tablet Chew 1 tablet (81 mg total) by mouth 2 (two) times daily for 28 days. 56 tablet 0  ? cyanocobalamin (,VITAMIN B-12,) 1000 MCG/ML injection Inject 1 mL (1,000 mcg total) into the muscle every 14 (fourteen) days. 20 mL 3  ? cyclobenzaprine (FLEXERIL) 5 MG tablet Take 1 tablet (5 mg total) by mouth 3 (three) times daily as needed for muscle spasms. 30 tablet 0  ? pantoprazole (PROTONIX) 40 MG tablet Take 1 tablet (40 mg total) by mouth daily. (Patient taking differently: Take 40 mg by mouth daily as needed (acid reflux).) 90 tablet 3  ? SYRINGE-NEEDLE, DISP, 3 ML (BD INTEGRA SYRINGE) 25G X 1" 3 ML MISC TO BE USED FOR B12 INJECTIONS 50 each 3  ? Cholecalciferol (VITAMIN D3) 50 MCG (2000 UT) capsule Take 1 capsule (2,000 Units total) by mouth daily. 100 capsule 3  ? docusate sodium (COLACE) 100 MG capsule Take 1 capsule (100 mg total) by mouth 2 (two) times daily. 10 capsule 0  ? oxyCODONE (OXY IR/ROXICODONE) 5 MG immediate release tablet Take 1-2 tablets (5-10 mg total) by mouth every 4 (four) hours as needed for severe pain. 42 tablet 0  ? polyethylene glycol (MIRALAX / GLYCOLAX) 17 g packet Take 17 g by mouth daily as needed for mild constipation. 14 each 0  ? Vitamin D, Ergocalciferol, (DRISDOL) 1.25 MG (50000 UNIT) CAPS capsule Take 1 capsule (50,000 Units total) by mouth every 7 (seven) days. 6 capsule 0  ? ?No facility-administered  medications prior to visit.  ? ? ?ROS: ?Review of Systems  ?Constitutional:  Negative for activity change, appetite change, chills, fatigue and unexpected weight change.  ?HENT:  Negative for congestion, mouth sores and sinus pressure.   ?Eyes:  Negative for visual disturbance.  ?Respiratory:  Negative for cough and chest tightness.   ?Gastrointestinal:  Negative for abdominal pain and nausea.  ?Genitourinary:  Negative for difficulty urinating, frequency and vaginal pain.  ?Musculoskeletal:  Positive for arthralgias, back pain and gait problem.  ?Skin:  Negative for pallor and rash.  ?Neurological:  Negative for dizziness, tremors, weakness, numbness and headaches.  ?Hematological:  Does not bruise/bleed easily.  ?Psychiatric/Behavioral:  Positive for agitation. Negative for confusion, sleep disturbance and suicidal ideas.   ? ?Objective:  ?BP (!) 142/84 (BP Location: Right Arm, Patient Position: Sitting, Cuff Size: Large)   Pulse (!) 102   Temp 97.8 ?F (36.6 ?C) (Oral)   Ht 5' 5"  (1.651 m)   Wt 192 lb (87.1 kg)   SpO2 92%   BMI 31.95 kg/m?  ? ?BP Readings from Last 3 Encounters:  ?08/12/21 (!) 142/84  ?07/16/21 134/77  ?07/08/21 (!) 143/88  ? ? ?Wt Readings from Last 3 Encounters:  ?08/12/21 192 lb (87.1 kg)  ?07/15/21 197 lb 15.6 oz (89.8 kg)  ?07/08/21 198 lb (89.8 kg)  ? ? ?Physical Exam ?Constitutional:   ?  General: She is not in acute distress. ?   Appearance: She is well-developed. She is obese.  ?HENT:  ?   Head: Normocephalic.  ?   Right Ear: External ear normal.  ?   Left Ear: External ear normal.  ?   Nose: Nose normal.  ?Eyes:  ?   General:     ?   Right eye: No discharge.     ?   Left eye: No discharge.  ?   Conjunctiva/sclera: Conjunctivae normal.  ?   Pupils: Pupils are equal, round, and reactive to light.  ?Neck:  ?   Thyroid: No thyromegaly.  ?   Vascular: No JVD.  ?   Trachea: No tracheal deviation.  ?Cardiovascular:  ?   Rate and Rhythm: Normal rate and regular rhythm.  ?   Heart sounds:  Normal heart sounds.  ?Pulmonary:  ?   Effort: No respiratory distress.  ?   Breath sounds: No stridor. No wheezing.  ?Abdominal:  ?   General: Bowel sounds are normal. There is no distension.  ?   Palpations: Abdomen is soft. There is no mass.  ?   Tenderness: There is no abdominal tenderness. There is no guarding or rebound.  ?Musculoskeletal:     ?   General: Swelling and tenderness present.  ?   Cervical back: Normal range of motion and neck supple. No rigidity.  ?Lymphadenopathy:  ?   Cervical: No cervical adenopathy.  ?Skin: ?   Findings: No erythema or rash.  ?Neurological:  ?   Mental Status: She is oriented to person, place, and time.  ?   Cranial Nerves: No cranial nerve deficit.  ?   Motor: No weakness or abnormal muscle tone.  ?   Coordination: Coordination abnormal.  ?   Gait: Gait abnormal.  ?   Deep Tendon Reflexes: Reflexes normal.  ?Psychiatric:     ?   Behavior: Behavior normal.     ?   Thought Content: Thought content normal.     ?   Judgment: Judgment normal.  ? ?L knee w/pain, swelling; warm ?Using a cane ? ?Lab Results  ?Component Value Date  ? WBC 8.0 07/16/2021  ? HGB 11.1 (L) 07/16/2021  ? HCT 34.0 (L) 07/16/2021  ? PLT 240 07/16/2021  ? GLUCOSE 141 (H) 07/16/2021  ? CHOL 172 05/27/2010  ? TRIG 100.0 05/27/2010  ? HDL 46.30 05/27/2010  ? LDLCALC 106 (H) 05/27/2010  ? ALT 11 07/08/2021  ? AST 17 07/08/2021  ? NA 132 (L) 07/16/2021  ? K 3.9 07/16/2021  ? CL 102 07/16/2021  ? CREATININE 1.05 (H) 07/16/2021  ? BUN 18 07/16/2021  ? CO2 22 07/16/2021  ? TSH 1.03 05/12/2021  ? INR 1.0 01/05/2018  ? HGBA1C 5.9 12/17/2009  ? MICROALBUR 0.6 02/10/2013  ? ? ?VAS [place] LOWER EXTREMITY VENOUS (DVT) ? ?Result Date: 07/28/2021 ? Lower Venous DVT Study Patient Name:  SOPHIANA MILANESE  Date of Exam:   07/28/2021 Medical Rec #: [id number]              Accession #:    [id number] Date of Birth: Sep 01, 1955              Patient Gender: F Patient Age:   85 years Exam Location:  [facility] Procedure:       VAS [place] LOWER EXTREMITY VENOUS (DVT) Referring Phys: Paralee Cancel --------------------------------------------------------------------------------  Indications: Pain, and Swelling.  Risk Factors: Surgery. Limitations: Poor ultrasound/tissue interface and body  habitus. Comparison Study: No prior studies. Performing Technologist: Oliver Hum RVT  Examination Guidelines: A complete evaluation includes B-mode imaging, spectral Doppler, color Doppler, and power Doppler as needed of all accessible portions of each vessel. Bilateral testing is considered an integral part of a complete examination. Limited examinations for reoccurring indications may be performed as noted. The reflux portion of the exam is performed with the patient in reverse Trendelenburg.  +-----+---------------+---------+-----------+----------+--------------+ RIGHTCompressibilityPhasicitySpontaneityPropertiesThrombus Aging +-----+---------------+---------+-----------+----------+--------------+ CFV  Full           Yes      Yes                                 +-----+---------------+---------+-----------+----------+--------------+   +---------+---------------+---------+-----------+----------+-------------------+ LEFT     CompressibilityPhasicitySpontaneityPropertiesThrombus Aging      +---------+---------------+---------+-----------+----------+-------------------+ CFV      Full           Yes      Yes                                      +---------+---------------+---------+-----------+----------+-------------------+ SFJ      Full                                                             +---------+---------------+---------+-----------+----------+-------------------+ FV Prox  Full                                                             +---------+---------------+---------+-----------+----------+-------------------+ FV Mid   Full                                                              +---------+---------------+---------+-----------+----------+-------------------+ FV Distal               Yes      Yes                                      +---------+---------------+---------+-----------+----------+-------------------+ PFV      Full

## 2021-08-12 NOTE — Assessment & Plan Note (Signed)
Hydrate well ?Monitor GFR ?

## 2021-08-12 NOTE — Assessment & Plan Note (Signed)
Change to Vit D monthly ?

## 2021-08-12 NOTE — Assessment & Plan Note (Addendum)
Possible L knee gout post-op ?Check L  knee fluid for gout - Ortho visit tomorrow ? ?

## 2021-08-12 NOTE — Assessment & Plan Note (Addendum)
L knee TKR on 07/15/21 by Dr Alvan Dame - pain, fluid accumulation. ?In PT ?Blue-Emu cream was recommended to use 2-3 times a day ?Meloxicam po w/caution. Hydrate well ?

## 2021-08-13 DIAGNOSIS — Z96652 Presence of left artificial knee joint: Secondary | ICD-10-CM | POA: Diagnosis not present

## 2021-08-13 DIAGNOSIS — M25662 Stiffness of left knee, not elsewhere classified: Secondary | ICD-10-CM | POA: Diagnosis not present

## 2021-08-13 DIAGNOSIS — M25562 Pain in left knee: Secondary | ICD-10-CM | POA: Diagnosis not present

## 2021-08-15 DIAGNOSIS — M25662 Stiffness of left knee, not elsewhere classified: Secondary | ICD-10-CM | POA: Diagnosis not present

## 2021-08-15 DIAGNOSIS — M25562 Pain in left knee: Secondary | ICD-10-CM | POA: Diagnosis not present

## 2021-08-16 NOTE — Assessment & Plan Note (Signed)
Doing well 

## 2021-10-01 DIAGNOSIS — H2513 Age-related nuclear cataract, bilateral: Secondary | ICD-10-CM | POA: Diagnosis not present

## 2021-10-01 DIAGNOSIS — H524 Presbyopia: Secondary | ICD-10-CM | POA: Diagnosis not present

## 2021-10-01 DIAGNOSIS — H5213 Myopia, bilateral: Secondary | ICD-10-CM | POA: Diagnosis not present

## 2021-10-13 ENCOUNTER — Encounter: Payer: Self-pay | Admitting: Internal Medicine

## 2021-10-13 ENCOUNTER — Ambulatory Visit (INDEPENDENT_AMBULATORY_CARE_PROVIDER_SITE_OTHER): Payer: Medicare Other | Admitting: Internal Medicine

## 2021-10-13 DIAGNOSIS — Z9884 Bariatric surgery status: Secondary | ICD-10-CM | POA: Diagnosis not present

## 2021-10-13 DIAGNOSIS — E538 Deficiency of other specified B group vitamins: Secondary | ICD-10-CM

## 2021-10-13 DIAGNOSIS — R1310 Dysphagia, unspecified: Secondary | ICD-10-CM | POA: Diagnosis not present

## 2021-10-13 MED ORDER — PANTOPRAZOLE SODIUM 40 MG PO TBEC: 40.0000 mg | DELAYED_RELEASE_TABLET | Freq: Two times a day (BID) | ORAL | 3 refills | Status: AC

## 2021-10-13 MED ORDER — TRAMADOL HCL 50 MG PO TABS
50.0000 mg | ORAL_TABLET | Freq: Four times a day (QID) | ORAL | 1 refills | Status: DC | PRN
Start: 2021-10-13 — End: 2022-03-05

## 2021-10-13 MED ORDER — CYCLOBENZAPRINE HCL 5 MG PO TABS: 5.0000 mg | ORAL_TABLET | Freq: Three times a day (TID) | ORAL | 1 refills | Status: DC | PRN

## 2021-11-08 ENCOUNTER — Other Ambulatory Visit: Payer: Self-pay | Admitting: Internal Medicine

## 2021-11-28 ENCOUNTER — Other Ambulatory Visit: Payer: Self-pay | Admitting: Internal Medicine

## 2021-11-28 ENCOUNTER — Telehealth: Payer: Self-pay

## 2021-11-28 NOTE — Telephone Encounter (Signed)
Pt is requesting a Rx for a gout flare up. Pt states that she cant barely walk and that she don't feel like she can make it in for an appt.  Please advise

## 2021-12-01 ENCOUNTER — Other Ambulatory Visit: Payer: Self-pay | Admitting: Internal Medicine

## 2021-12-01 MED ORDER — PREDNISONE 20 MG PO TABS: 20.0000 mg | ORAL_TABLET | Freq: Every day | ORAL | 0 refills | Status: DC

## 2021-12-01 NOTE — Telephone Encounter (Signed)
I advised the pt that Rx for Prednisone was sent to the pharmacy. I advise her also that she would need to schedule appt she declines at this time saying she would not be able to come in.

## 2021-12-01 NOTE — Telephone Encounter (Signed)
Okay prednisone-prescription sent.  Office visit with any provider.  Thanks

## 2021-12-24 ENCOUNTER — Other Ambulatory Visit: Payer: Self-pay | Admitting: Internal Medicine

## 2021-12-24 DIAGNOSIS — Z1231 Encounter for screening mammogram for malignant neoplasm of breast: Secondary | ICD-10-CM

## 2021-12-30 ENCOUNTER — Other Ambulatory Visit: Payer: Self-pay | Admitting: Internal Medicine

## 2022-01-06 ENCOUNTER — Other Ambulatory Visit: Payer: Self-pay | Admitting: Internal Medicine

## 2022-01-13 ENCOUNTER — Encounter: Payer: Self-pay | Admitting: Internal Medicine

## 2022-01-13 ENCOUNTER — Ambulatory Visit (INDEPENDENT_AMBULATORY_CARE_PROVIDER_SITE_OTHER): Payer: Medicare Other | Admitting: Internal Medicine

## 2022-01-13 VITALS — BP 132/84 | HR 70 | Temp 98.8°F | Ht 65.0 in | Wt 198.4 lb

## 2022-01-13 DIAGNOSIS — E538 Deficiency of other specified B group vitamins: Secondary | ICD-10-CM

## 2022-01-13 DIAGNOSIS — N1832 Chronic kidney disease, stage 3b: Secondary | ICD-10-CM

## 2022-01-13 DIAGNOSIS — I10 Essential (primary) hypertension: Secondary | ICD-10-CM

## 2022-01-13 DIAGNOSIS — Z23 Encounter for immunization: Secondary | ICD-10-CM

## 2022-01-13 DIAGNOSIS — M5440 Lumbago with sciatica, unspecified side: Secondary | ICD-10-CM | POA: Diagnosis not present

## 2022-01-13 DIAGNOSIS — G8929 Other chronic pain: Secondary | ICD-10-CM

## 2022-01-13 DIAGNOSIS — E669 Obesity, unspecified: Secondary | ICD-10-CM

## 2022-01-13 LAB — COMPREHENSIVE METABOLIC PANEL
ALT: 12 U/L (ref 0–35)
AST: 19 U/L (ref 0–37)
Albumin: 3.7 g/dL (ref 3.5–5.2)
Alkaline Phosphatase: 86 U/L (ref 39–117)
BUN: 25 mg/dL — ABNORMAL HIGH (ref 6–23)
CO2: 24 mEq/L (ref 19–32)
Calcium: 9.4 mg/dL (ref 8.4–10.5)
Chloride: 106 mEq/L (ref 96–112)
Creatinine, Ser: 1.23 mg/dL — ABNORMAL HIGH (ref 0.40–1.20)
GFR: 45.86 mL/min — ABNORMAL LOW (ref >60.00)
Glucose, Bld: 85 mg/dL (ref 70–99)
Potassium: 4 mEq/L (ref 3.5–5.1)
Sodium: 139 mEq/L (ref 135–145)
Total Bilirubin: 0.5 mg/dL (ref 0.2–1.2)
Total Protein: 7.9 g/dL (ref 6.0–8.3)

## 2022-01-13 LAB — CBC WITH DIFFERENTIAL/PLATELET
Basophils Absolute: 0 10*3/uL (ref 0.0–0.1)
Basophils Relative: 0.3 % (ref 0.0–3.0)
Eosinophils Absolute: 0.2 10*3/uL (ref 0.0–0.7)
Eosinophils Relative: 2.9 % (ref 0.0–5.0)
HCT: 33.9 % — ABNORMAL LOW (ref 36.0–46.0)
Hemoglobin: 11.2 g/dL — ABNORMAL LOW (ref 12.0–15.0)
Lymphocytes Relative: 39.5 % (ref 12.0–46.0)
Lymphs Abs: 2.1 10*3/uL (ref 0.7–4.0)
MCHC: 33 g/dL (ref 30.0–36.0)
MCV: 96.2 fl (ref 78.0–100.0)
Monocytes Absolute: 0.7 10*3/uL (ref 0.1–1.0)
Monocytes Relative: 12.6 % — ABNORMAL HIGH (ref 3.0–12.0)
Neutro Abs: 2.4 10*3/uL (ref 1.4–7.7)
Neutrophils Relative %: 44.7 % (ref 43.0–77.0)
Platelets: 246 10*3/uL (ref 150.0–400.0)
RBC: 3.52 Mil/uL — ABNORMAL LOW (ref 3.87–5.11)
RDW: 18 % — ABNORMAL HIGH (ref 11.5–15.5)
WBC: 5.3 10*3/uL (ref 4.0–10.5)

## 2022-01-13 MED ORDER — VITAMIN D-3 5000 UNIT/ML SL LIQD: 10000.0000 [IU] | SUBLINGUAL | 5 refills | Status: DC

## 2022-01-13 MED ORDER — CYCLOBENZAPRINE HCL 5 MG PO TABS: ORAL_TABLET | ORAL | 3 refills | Status: DC

## 2022-01-13 MED ORDER — PHENTERMINE HCL 37.5 MG PO TABS: 37.5000 mg | ORAL_TABLET | Freq: Every day | ORAL | 2 refills | Status: DC

## 2022-01-13 NOTE — Assessment & Plan Note (Signed)
Flexeril 5 mg at hs  Cont w/Tramadol

## 2022-01-13 NOTE — Assessment & Plan Note (Signed)
Hydrate well Monitor GFR

## 2022-01-13 NOTE — Assessment & Plan Note (Signed)
On B12 

## 2022-01-13 NOTE — Progress Notes (Signed)
Subjective:  Patient ID: Emily Sanchez, female    DOB: [birth year]  Age: 66 y.o. MRN: [id number]  CC: Follow-up (3 month f/u- Flu shot)   HPI Emily Sanchez presents for LBP, HTN, CRI C/o wt gain  Outpatient Medications Prior to Visit  Medication Sig Dispense Refill   acetaminophen (TYLENOL) 500 MG tablet Take 2 tablets (1,000 mg total) by mouth every 8 (eight) hours. (Patient taking differently: Take 1,000 mg by mouth every 6 (six) hours as needed for moderate pain.) 30 tablet 0   cyanocobalamin (,VITAMIN B-12,) 1000 MCG/ML injection Inject 1 mL (1,000 mcg total) into the muscle every 14 (fourteen) days. 20 mL 3   pantoprazole (PROTONIX) 40 MG tablet Take 1 tablet (40 mg total) by mouth 2 (two) times daily. 180 tablet 3   predniSONE (DELTASONE) 20 MG tablet Take 1 tablet (20 mg total) by mouth daily with breakfast. 5 tablet 0   SYRINGE-NEEDLE, DISP, 3 ML (BD INTEGRA SYRINGE) 25G X 1" 3 ML MISC TO BE USED FOR B12 INJECTIONS 50 each 3   traMADol (ULTRAM) 50 MG tablet Take 1 tablet (50 mg total) by mouth every 6 (six) hours as needed. 120 tablet 1   cyclobenzaprine (FLEXERIL) 5 MG tablet TAKE 1 TABLET BY MOUTH THREE TIMES A DAY AS NEEDED FOR MUSCLE SPASMS 30 tablet 1   Vitamin D, Ergocalciferol, (DRISDOL) 1.25 MG (50000 UNIT) CAPS capsule Take 1 capsule (50,000 Units total) by mouth every 30 (thirty) days. 3 capsule 3   No facility-administered medications prior to visit.    ROS: Review of Systems  Constitutional:  Negative for activity change, appetite change, chills, fatigue and unexpected weight change.  HENT:  Negative for congestion, mouth sores and sinus pressure.   Eyes:  Negative for visual disturbance.  Respiratory:  Negative for cough and chest tightness.   Gastrointestinal:  Negative for abdominal pain and nausea.  Genitourinary:  Negative for difficulty urinating, frequency and vaginal pain.  Musculoskeletal:  Positive for arthralgias, back pain and gait  problem.  Skin:  Negative for pallor and rash.  Neurological:  Negative for dizziness, tremors, weakness, numbness and headaches.  Psychiatric/Behavioral:  Negative for confusion, sleep disturbance and suicidal ideas. The patient is nervous/anxious.     Objective:  BP 132/84 (BP Location: Left Arm)   Pulse 70   Temp 98.8 F (37.1 C) (Oral)   Ht 5' 5"  (1.651 m)   Wt 198 lb 6.4 oz (90 kg)   BMI 33.02 kg/m   BP Readings from Last 3 Encounters:  01/13/22 132/84  10/13/21 130/70  08/12/21 (!) 142/84    Wt Readings from Last 3 Encounters:  01/13/22 198 lb 6.4 oz (90 kg)  10/13/21 193 lb (87.5 kg)  08/12/21 192 lb (87.1 kg)    Physical Exam Constitutional:      General: She is not in acute distress.    Appearance: She is well-developed. She is obese.  HENT:     Head: Normocephalic.     Right Ear: External ear normal.     Left Ear: External ear normal.     Nose: Nose normal.  Eyes:     General:        Right eye: No discharge.        Left eye: No discharge.     Conjunctiva/sclera: Conjunctivae normal.     Pupils: Pupils are equal, round, and reactive to light.  Neck:     Thyroid: No thyromegaly.     Vascular: No  JVD.     Trachea: No tracheal deviation.  Cardiovascular:     Rate and Rhythm: Normal rate and regular rhythm.     Heart sounds: Normal heart sounds.  Pulmonary:     Effort: No respiratory distress.     Breath sounds: No stridor. No wheezing.  Abdominal:     General: Bowel sounds are normal. There is no distension.     Palpations: Abdomen is soft. There is no mass.     Tenderness: There is no abdominal tenderness. There is no guarding or rebound.  Musculoskeletal:        General: No tenderness.     Cervical back: Normal range of motion and neck supple. No rigidity.  Lymphadenopathy:     Cervical: No cervical adenopathy.  Skin:    Findings: No erythema or rash.  Neurological:     Cranial Nerves: No cranial nerve deficit.     Motor: No abnormal muscle  tone.     Coordination: Coordination normal.     Deep Tendon Reflexes: Reflexes normal.  Psychiatric:        Behavior: Behavior normal.        Thought Content: Thought content normal.        Judgment: Judgment normal.     Lab Results  Component Value Date   WBC 5.3 01/13/2022   HGB 11.2 (L) 01/13/2022   HCT 33.9 (L) 01/13/2022   PLT 246.0 01/13/2022   GLUCOSE 85 01/13/2022   CHOL 172 05/27/2010   TRIG 100.0 05/27/2010   HDL 46.30 05/27/2010   LDLCALC 106 (H) 05/27/2010   ALT 12 01/13/2022   AST 19 01/13/2022   NA 139 01/13/2022   K 4.0 01/13/2022   CL 106 01/13/2022   CREATININE 1.23 (H) 01/13/2022   BUN 25 (H) 01/13/2022   CO2 24 01/13/2022   TSH 1.03 05/12/2021   INR 1.0 01/05/2018   HGBA1C 5.9 12/17/2009   MICROALBUR 0.6 02/10/2013    VAS [place] LOWER EXTREMITY VENOUS (DVT)  Result Date: 07/28/2021  Lower Venous DVT Study Patient Name:  Emily Sanchez  Date of Exam:   07/28/2021 Medical Rec #: [id number]              Accession #:    [id number] Date of Birth: Dec 09, 1955              Patient Gender: F Patient Age:   32 years Exam Location:  [facility] Procedure:      VAS [place] LOWER EXTREMITY VENOUS (DVT) Referring Phys: Paralee Cancel --------------------------------------------------------------------------------  Indications: Pain, and Swelling.  Risk Factors: Surgery. Limitations: Poor ultrasound/tissue interface and body habitus. Comparison Study: No prior studies. Performing Technologist: Oliver Hum RVT  Examination Guidelines: A complete evaluation includes B-mode imaging, spectral Doppler, color Doppler, and power Doppler as needed of all accessible portions of each vessel. Bilateral testing is considered an integral part of a complete examination. Limited examinations for reoccurring indications may be performed as noted. The reflux portion of the exam is performed with the patient in reverse Trendelenburg.   +-----+---------------+---------+-----------+----------+--------------+ RIGHTCompressibilityPhasicitySpontaneityPropertiesThrombus Aging +-----+---------------+---------+-----------+----------+--------------+ CFV  Full           Yes      Yes                                 +-----+---------------+---------+-----------+----------+--------------+   +---------+---------------+---------+-----------+----------+-------------------+ LEFT     CompressibilityPhasicitySpontaneityPropertiesThrombus Aging      +---------+---------------+---------+-----------+----------+-------------------+ CFV  Full           Yes      Yes                                      +---------+---------------+---------+-----------+----------+-------------------+ SFJ      Full                                                             +---------+---------------+---------+-----------+----------+-------------------+ FV Prox  Full                                                             +---------+---------------+---------+-----------+----------+-------------------+ FV Mid   Full                                                             +---------+---------------+---------+-----------+----------+-------------------+ FV Distal               Yes      Yes                                      +---------+---------------+---------+-----------+----------+-------------------+ PFV      Full                                                             +---------+---------------+---------+-----------+----------+-------------------+ POP      Full           Yes      Yes                                      +---------+---------------+---------+-----------+----------+-------------------+ PTV      Full                                                             +---------+---------------+---------+-----------+----------+-------------------+ PERO                                                   Not well visualized +---------+---------------+---------+-----------+----------+-------------------+     Summary: RIGHT: - No evidence of common femoral vein obstruction.  LEFT: - There is no evidence of deep vein thrombosis in the lower extremity. However, portions  of this examination were limited- see technologist comments above.  - No cystic structure found in the popliteal fossa.  *See table(s) above for measurements and observations. Electronically signed by Monica Martinez MD on 07/28/2021 at 1:27:35 PM.    Final     Assessment & Plan:   Problem List Items Addressed This Visit     B12 deficiency    On B12      CRI (chronic renal insufficiency)    Hydrate well Monitor GFR      Relevant Orders   Comprehensive metabolic panel (Completed)   CBC with Differential/Platelet (Completed)   Essential hypertension    Nl BP On HCTZ      Relevant Orders   Comprehensive metabolic panel (Completed)   CBC with Differential/Platelet (Completed)   LOW BACK PAIN - Primary    Flexeril 5 mg at hs  Cont w/Tramadol       Relevant Medications   cyclobenzaprine (FLEXERIL) 5 MG tablet   Obesity (BMI 30-39.9)    Worse BMI 33 Phentermine po  Potential benefits of a long term phentermine  use as well as potential risks  and complications were explained to the patient and were aknowledged.       Relevant Medications   phentermine (ADIPEX-P) 37.5 MG tablet   Other Visit Diagnoses     Needs flu shot       Relevant Orders   Flu Vaccine QUAD High Dose(Fluad) (Completed)         Meds ordered this encounter  Medications   Cholecalciferol (VITAMIN D-3) 5000 UNIT/ML LIQD    Sig: Place 10,000 Units under the tongue once a week.    Dispense:  30 mL    Refill:  5   phentermine (ADIPEX-P) 37.5 MG tablet    Sig: Take 1 tablet (37.5 mg total) by mouth daily before breakfast.    Dispense:  30 tablet    Refill:  2   cyclobenzaprine (FLEXERIL) 5 MG tablet    Sig: TAKE 1 TABLET  BY MOUTH THREE TIMES A DAY AS NEEDED FOR MUSCLE SPASMS    Dispense:  30 tablet    Refill:  3      Follow-up: Return in about 3 months (around 04/14/2022) for a follow-up visit.  Walker Kehr, MD

## 2022-01-13 NOTE — Assessment & Plan Note (Signed)
Worse BMI 33 Phentermine po  Potential benefits of a long term phentermine  use as well as potential risks  and complications were explained to the patient and were aknowledged.

## 2022-01-13 NOTE — Assessment & Plan Note (Signed)
Nl BP On HCTZ

## 2022-01-22 DIAGNOSIS — Z432 Encounter for attention to ileostomy: Secondary | ICD-10-CM | POA: Diagnosis not present

## 2022-01-28 ENCOUNTER — Ambulatory Visit
Admission: RE | Admit: 2022-01-28 | Discharge: 2022-01-28 | Disposition: A | Discharge status: Home / Self Care | Payer: Medicare Other | Source: Ambulatory Visit | Attending: Internal Medicine | Admitting: Internal Medicine

## 2022-01-28 DIAGNOSIS — Z1231 Encounter for screening mammogram for malignant neoplasm of breast: Secondary | ICD-10-CM | POA: Diagnosis not present

## 2022-02-17 DIAGNOSIS — Z432 Encounter for attention to ileostomy: Secondary | ICD-10-CM | POA: Diagnosis not present

## 2022-03-04 ENCOUNTER — Other Ambulatory Visit: Payer: Self-pay | Admitting: Internal Medicine

## 2022-04-21 ENCOUNTER — Telehealth: Payer: Self-pay

## 2022-04-21 ENCOUNTER — Ambulatory Visit: Payer: Medicare Other

## 2022-04-21 NOTE — Telephone Encounter (Signed)
Called patient lvm to return call, to complete AWV at [phone].  If no return call within 15 minutes, patient may reschedule for the next available appointment with [facility] or CMA. -S. Jake Goodson,LPN 

## 2022-04-22 ENCOUNTER — Ambulatory Visit: Payer: Medicare Other | Admitting: Internal Medicine

## 2022-05-28 ENCOUNTER — Other Ambulatory Visit: Payer: Self-pay | Admitting: Internal Medicine

## 2022-06-03 ENCOUNTER — Ambulatory Visit (INDEPENDENT_AMBULATORY_CARE_PROVIDER_SITE_OTHER): Payer: Medicare Other | Admitting: Internal Medicine

## 2022-06-03 ENCOUNTER — Encounter: Payer: Self-pay | Admitting: Internal Medicine

## 2022-06-03 VITALS — BP 162/110 | HR 113 | Temp 99.0°F | Ht 65.0 in | Wt 192.0 lb

## 2022-06-03 DIAGNOSIS — J209 Acute bronchitis, unspecified: Secondary | ICD-10-CM | POA: Diagnosis not present

## 2022-06-03 DIAGNOSIS — E559 Vitamin D deficiency, unspecified: Secondary | ICD-10-CM | POA: Diagnosis not present

## 2022-06-03 DIAGNOSIS — M5440 Lumbago with sciatica, unspecified side: Secondary | ICD-10-CM | POA: Diagnosis not present

## 2022-06-03 DIAGNOSIS — G8929 Other chronic pain: Secondary | ICD-10-CM

## 2022-06-03 DIAGNOSIS — I10 Essential (primary) hypertension: Secondary | ICD-10-CM | POA: Diagnosis not present

## 2022-06-03 DIAGNOSIS — E538 Deficiency of other specified B group vitamins: Secondary | ICD-10-CM

## 2022-06-03 DIAGNOSIS — N1832 Chronic kidney disease, stage 3b: Secondary | ICD-10-CM | POA: Diagnosis not present

## 2022-06-03 MED ORDER — PROMETHAZINE-DM 6.25-15 MG/5ML PO SYRP: 5.0000 mL | ORAL_SOLUTION | Freq: Four times a day (QID) | ORAL | 0 refills | Status: DC | PRN

## 2022-06-03 MED ORDER — FLUCONAZOLE 150 MG PO TABS: 150.0000 mg | ORAL_TABLET | Freq: Once | ORAL | 1 refills | Status: AC

## 2022-06-03 MED ORDER — CEFDINIR 300 MG PO CAPS: 300.0000 mg | ORAL_CAPSULE | Freq: Two times a day (BID) | ORAL | 0 refills | Status: DC

## 2022-06-03 NOTE — Assessment & Plan Note (Signed)
On HCTZ 

## 2022-06-03 NOTE — Assessment & Plan Note (Signed)
Stage 3 Hydrate well

## 2022-06-03 NOTE — Progress Notes (Signed)
Subjective:  Patient ID: Emily Sanchez, female    DOB: 01-01-1956  Age: 67 y.o. MRN: [id number]  CC: Cough (CONGESTION X3 WEEK WITH PERSISTENT PRODUCTIVE COUGH)   HPI Emily Sanchez presents for URI x 3 wks Brown sputum  Outpatient Medications Prior to Visit  Medication Sig Dispense Refill   acetaminophen (TYLENOL) 500 MG tablet Take 2 tablets (1,000 mg total) by mouth every 8 (eight) hours. (Patient taking differently: Take 1,000 mg by mouth every 6 (six) hours as needed for moderate pain.) 30 tablet 0   Cholecalciferol (VITAMIN D-3) 5000 UNIT/ML LIQD Place 10,000 Units under the tongue once a week. 30 mL 5   cyanocobalamin (,VITAMIN B-12,) 1000 MCG/ML injection Inject 1 mL (1,000 mcg total) into the muscle every 14 (fourteen) days. 20 mL 3   cyclobenzaprine (FLEXERIL) 5 MG tablet TAKE 1 TABLET BY MOUTH EVERYDAY AT BEDTIME FOLLOW-UP APPT IS DUE MUST SEE PROVIDER FOR FUTURE REFILLS 30 tablet 3   meloxicam (MOBIC) 7.5 MG tablet TAKE 1 TABLET BY MOUTH EVERY DAY 30 tablet 1   pantoprazole (PROTONIX) 40 MG tablet Take 1 tablet (40 mg total) by mouth 2 (two) times daily. 180 tablet 3   predniSONE (DELTASONE) 20 MG tablet Take 1 tablet (20 mg total) by mouth daily with breakfast. 5 tablet 0   SYRINGE-NEEDLE, DISP, 3 ML (BD INTEGRA SYRINGE) 25G X 1" 3 ML MISC TO BE USED FOR B12 INJECTIONS 50 each 3   traMADol (ULTRAM) 50 MG tablet TAKE 1 TABLET BY MOUTH EVERY 6 HOURS AS NEEDED. 120 tablet 1   phentermine (ADIPEX-P) 37.5 MG tablet Take 1 tablet (37.5 mg total) by mouth daily before breakfast. 30 tablet 2   No facility-administered medications prior to visit.    ROS: Review of Systems  Constitutional:  Negative for activity change, appetite change, chills, fatigue and unexpected weight change.  HENT:  Positive for congestion, postnasal drip and sinus pressure. Negative for mouth sores.   Eyes:  Negative for visual disturbance.  Respiratory:  Positive for cough. Negative for  chest tightness and shortness of breath.   Cardiovascular:  Negative for palpitations.  Gastrointestinal:  Negative for abdominal pain and nausea.  Genitourinary:  Negative for difficulty urinating, frequency and vaginal pain.  Musculoskeletal:  Negative for back pain and gait problem.  Skin:  Negative for pallor and rash.  Neurological:  Negative for dizziness, tremors, weakness, numbness and headaches.  Psychiatric/Behavioral:  Negative for confusion and sleep disturbance.     Objective:  BP (!) 162/110 (BP Location: Right Arm, Patient Position: Sitting, Cuff Size: Normal)   Pulse (!) 113   Temp 99 F (37.2 C) (Oral)   Ht 5' 5"$  (1.651 m)   Wt 192 lb (87.1 kg)   SpO2 98%   BMI 31.95 kg/m   BP Readings from Last 3 Encounters:  06/03/22 (!) 162/110  01/13/22 132/84  10/13/21 130/70    Wt Readings from Last 3 Encounters:  06/03/22 192 lb (87.1 kg)  01/13/22 198 lb 6.4 oz (90 kg)  10/13/21 193 lb (87.5 kg)    Physical Exam Constitutional:      General: She is not in acute distress.    Appearance: She is well-developed. She is obese.  HENT:     Head: Normocephalic.     Right Ear: External ear normal.     Left Ear: External ear normal.     Nose: Nose normal.  Eyes:     General:  Right eye: No discharge.        Left eye: No discharge.     Conjunctiva/sclera: Conjunctivae normal.     Pupils: Pupils are equal, round, and reactive to light.  Neck:     Thyroid: No thyromegaly.     Vascular: No JVD.     Trachea: No tracheal deviation.  Cardiovascular:     Rate and Rhythm: Normal rate and regular rhythm.     Heart sounds: Normal heart sounds.  Pulmonary:     Effort: No respiratory distress.     Breath sounds: No stridor. No wheezing.  Abdominal:     General: Bowel sounds are normal. There is no distension.     Palpations: Abdomen is soft. There is no mass.     Tenderness: There is no abdominal tenderness. There is no guarding or rebound.  Musculoskeletal:         General: Tenderness present.     Cervical back: Normal range of motion and neck supple. No rigidity.  Lymphadenopathy:     Cervical: No cervical adenopathy.  Skin:    Findings: No erythema or rash.  Neurological:     Mental Status: She is oriented to person, place, and time.     Cranial Nerves: No cranial nerve deficit.     Motor: No abnormal muscle tone.     Coordination: Coordination normal.     Deep Tendon Reflexes: Reflexes normal.  Psychiatric:        Behavior: Behavior normal.        Thought Content: Thought content normal.        Judgment: Judgment normal.     Lab Results  Component Value Date   WBC 5.3 01/13/2022   HGB 11.2 (L) 01/13/2022   HCT 33.9 (L) 01/13/2022   PLT 246.0 01/13/2022   GLUCOSE 85 01/13/2022   CHOL 172 05/27/2010   TRIG 100.0 05/27/2010   HDL 46.30 05/27/2010   LDLCALC 106 (H) 05/27/2010   ALT 12 01/13/2022   AST 19 01/13/2022   NA 139 01/13/2022   K 4.0 01/13/2022   CL 106 01/13/2022   CREATININE 1.23 (H) 01/13/2022   BUN 25 (H) 01/13/2022   CO2 24 01/13/2022   TSH 1.03 05/12/2021   INR 1.0 01/05/2018   HGBA1C 5.9 12/17/2009   MICROALBUR 0.6 02/10/2013    MM 3D SCREEN BREAST BILATERAL  Result Date: 01/28/2022 CLINICAL DATA:  Screening. EXAM: DIGITAL SCREENING BILATERAL MAMMOGRAM WITH TOMOSYNTHESIS AND CAD TECHNIQUE: Bilateral screening digital craniocaudal and mediolateral oblique mammograms were obtained. Bilateral screening digital breast tomosynthesis was performed. The images were evaluated with computer-aided detection. COMPARISON:  Previous exam(s). ACR Breast Density Category a: The breast tissue is almost entirely fatty. FINDINGS: There are no findings suspicious for malignancy. IMPRESSION: No mammographic evidence of malignancy. A result letter of this screening mammogram will be mailed directly to the patient. RECOMMENDATION: Screening mammogram in one year. (Code:SM-B-01Y) BI-RADS CATEGORY  1: Negative. Electronically Signed    By: Marin Olp M.D.   On: 01/28/2022 16:11    Assessment & Plan:   Problem List Items Addressed This Visit       Cardiovascular and Mediastinum   Essential hypertension - Primary    On HCTZ        Respiratory   Acute bronchitis    Start Cefdinir Prom DM syr        Genitourinary   CRI (chronic renal insufficiency)    Stage 3 Hydrate well  Other   Vitamin D deficiency    On Vit D      LOW BACK PAIN    Chronic sx's      B12 deficiency    On B12         Meds ordered this encounter  Medications   cefdinir (OMNICEF) 300 MG capsule    Sig: Take 1 capsule (300 mg total) by mouth 2 (two) times daily.    Dispense:  20 capsule    Refill:  0   promethazine-dextromethorphan (PROMETHAZINE-DM) 6.25-15 MG/5ML syrup    Sig: Take 5 mLs by mouth 4 (four) times daily as needed for cough.    Dispense:  240 mL    Refill:  0   fluconazole (DIFLUCAN) 150 MG tablet    Sig: Take 1 tablet (150 mg total) by mouth once for 1 dose.    Dispense:  1 tablet    Refill:  1      Follow-up: Return in about 3 months (around 09/01/2022) for a follow-up visit.  Walker Kehr, MD

## 2022-06-03 NOTE — Assessment & Plan Note (Signed)
Chronic sx's 

## 2022-06-03 NOTE — Assessment & Plan Note (Signed)
On Vit D 

## 2022-06-03 NOTE — Assessment & Plan Note (Signed)
Start Cefdinir Prom DM syr

## 2022-06-03 NOTE — Assessment & Plan Note (Signed)
On B12 

## 2022-06-04 ENCOUNTER — Telehealth: Payer: Self-pay | Admitting: Internal Medicine

## 2022-06-04 NOTE — Telephone Encounter (Signed)
Patient called and said she was seen 06/03/22 and was prescribed  cefdinir (OMNICEF) 300 MG capsule . She said she is unable to swallow the capsules and would like to know what Dr. Alain Marion would like for her to do. Best callback is [phone].

## 2022-06-04 NOTE — Telephone Encounter (Signed)
Can she open capsule and spill content in apple sauce, fruit jelly etc? Thx

## 2022-06-05 NOTE — Telephone Encounter (Signed)
Spoke with the pt and was able to inform her of Dr. Judeen Hammans instructions. Pt states she understands and will do so.

## 2022-06-10 DIAGNOSIS — Z432 Encounter for attention to ileostomy: Secondary | ICD-10-CM | POA: Diagnosis not present

## 2022-06-29 ENCOUNTER — Other Ambulatory Visit: Payer: Self-pay | Admitting: Internal Medicine

## 2022-09-01 ENCOUNTER — Ambulatory Visit (INDEPENDENT_AMBULATORY_CARE_PROVIDER_SITE_OTHER): Payer: Medicare Other | Admitting: Internal Medicine

## 2022-09-01 ENCOUNTER — Encounter: Payer: Self-pay | Admitting: Internal Medicine

## 2022-09-01 VITALS — BP 130/78 | HR 64 | Temp 97.8°F | Ht 65.0 in | Wt 191.0 lb

## 2022-09-01 DIAGNOSIS — M5440 Lumbago with sciatica, unspecified side: Secondary | ICD-10-CM | POA: Diagnosis not present

## 2022-09-01 DIAGNOSIS — E669 Obesity, unspecified: Secondary | ICD-10-CM

## 2022-09-01 DIAGNOSIS — G9332 Myalgic encephalomyelitis/chronic fatigue syndrome: Secondary | ICD-10-CM | POA: Diagnosis not present

## 2022-09-01 DIAGNOSIS — N1832 Chronic kidney disease, stage 3b: Secondary | ICD-10-CM | POA: Diagnosis not present

## 2022-09-01 DIAGNOSIS — K50118 Crohn's disease of large intestine with other complication: Secondary | ICD-10-CM

## 2022-09-01 DIAGNOSIS — E559 Vitamin D deficiency, unspecified: Secondary | ICD-10-CM | POA: Diagnosis not present

## 2022-09-01 DIAGNOSIS — G8929 Other chronic pain: Secondary | ICD-10-CM

## 2022-09-01 DIAGNOSIS — E538 Deficiency of other specified B group vitamins: Secondary | ICD-10-CM | POA: Diagnosis not present

## 2022-09-01 MED ORDER — PHENTERMINE HCL 37.5 MG PO TABS: 37.5000 mg | ORAL_TABLET | Freq: Every day | ORAL | 2 refills | Status: DC

## 2022-09-01 MED ORDER — CYCLOBENZAPRINE HCL 5 MG PO TABS: ORAL_TABLET | ORAL | 3 refills | Status: DC

## 2022-09-01 MED ORDER — TRAMADOL HCL 50 MG PO TABS: 50.0000 mg | ORAL_TABLET | Freq: Four times a day (QID) | ORAL | 2 refills | Status: DC | PRN

## 2022-09-01 NOTE — Assessment & Plan Note (Signed)
Hydrate well ?Monitor GFR ?

## 2022-09-01 NOTE — Assessment & Plan Note (Signed)
On B12 

## 2022-09-01 NOTE — Assessment & Plan Note (Signed)
Phentermine po  Potential benefits of a long term phentermine use as well as potential risks  and complications were explained to the patient and were aknowledged. 

## 2022-09-01 NOTE — Assessment & Plan Note (Signed)
Flexeril 5 mg at hs  Tramadol

## 2022-09-01 NOTE — Progress Notes (Signed)
Subjective:  Patient ID: Emily Sanchez, female    DOB: 1956/01/13  Age: 67 y.o. MRN: [id number]  CC: Follow-up (3 mnth f/u)   HPI Chirsty Cleda Daub presents for LBP, obesity, spasms  Outpatient Medications Prior to Visit  Medication Sig Dispense Refill   acetaminophen (TYLENOL) 500 MG tablet Take 2 tablets (1,000 mg total) by mouth every 8 (eight) hours. (Patient taking differently: Take 1,000 mg by mouth every 6 (six) hours as needed for moderate pain.) 30 tablet 0   cefdinir (OMNICEF) 300 MG capsule Take 1 capsule (300 mg total) by mouth 2 (two) times daily. 20 capsule 0   Cholecalciferol (VITAMIN D-3) 5000 UNIT/ML LIQD Place 10,000 Units under the tongue once a week. 30 mL 5   cyanocobalamin (,VITAMIN B-12,) 1000 MCG/ML injection Inject 1 mL (1,000 mcg total) into the muscle every 14 (fourteen) days. 20 mL 3   meloxicam (MOBIC) 7.5 MG tablet TAKE 1 TABLET BY MOUTH EVERY DAY 30 tablet 1   pantoprazole (PROTONIX) 40 MG tablet Take 1 tablet (40 mg total) by mouth 2 (two) times daily. 180 tablet 3   SYRINGE-NEEDLE, DISP, 3 ML (BD INTEGRA SYRINGE) 25G X 1" 3 ML MISC TO BE USED FOR B12 INJECTIONS 50 each 3   cyclobenzaprine (FLEXERIL) 5 MG tablet TAKE 1 TABLET BY MOUTH EVERYDAY AT BEDTIME FOLLOW-UP APPT IS DUE MUST SEE PROVIDER FOR FUTURE REFILLS 30 tablet 3   predniSONE (DELTASONE) 20 MG tablet Take 1 tablet (20 mg total) by mouth daily with breakfast. 5 tablet 0   promethazine-dextromethorphan (PROMETHAZINE-DM) 6.25-15 MG/5ML syrup Take 5 mLs by mouth 4 (four) times daily as needed for cough. 240 mL 0   traMADol (ULTRAM) 50 MG tablet TAKE 1 TABLET BY MOUTH EVERY 6 HOURS AS NEEDED. 120 tablet 1   phentermine (ADIPEX-P) 37.5 MG tablet Take 1 tablet (37.5 mg total) by mouth daily before breakfast. 30 tablet 2   No facility-administered medications prior to visit.    ROS: Review of Systems  Constitutional:  Negative for activity change, appetite change, chills, fatigue and  unexpected weight change.  HENT:  Negative for congestion, mouth sores and sinus pressure.   Eyes:  Negative for visual disturbance.  Respiratory:  Negative for cough and chest tightness.   Gastrointestinal:  Negative for abdominal pain and nausea.  Genitourinary:  Negative for difficulty urinating, frequency and vaginal pain.  Musculoskeletal:  Positive for arthralgias and back pain. Negative for gait problem.  Skin:  Negative for pallor and rash.  Neurological:  Negative for dizziness, tremors, weakness, numbness and headaches.  Psychiatric/Behavioral:  Negative for confusion and sleep disturbance.     Objective:  BP 130/78 (BP Location: Left Arm, Patient Position: Sitting, Cuff Size: Large)   Pulse 64   Temp 97.8 F (36.6 C) (Oral)   Ht 5\' 5"  (1.651 m)   Wt 191 lb (86.6 kg)   SpO2 93%   BMI 31.78 kg/m   BP Readings from Last 3 Encounters:  09/01/22 130/78  06/03/22 (!) 162/110  01/13/22 132/84    Wt Readings from Last 3 Encounters:  09/01/22 191 lb (86.6 kg)  06/03/22 192 lb (87.1 kg)  01/13/22 198 lb 6.4 oz (90 kg)    Physical Exam Constitutional:      General: She is not in acute distress.    Appearance: She is well-developed. She is obese.  HENT:     Head: Normocephalic.     Right Ear: External ear normal.     Left  Ear: External ear normal.     Nose: Nose normal.  Eyes:     General:        Right eye: No discharge.        Left eye: No discharge.     Conjunctiva/sclera: Conjunctivae normal.     Pupils: Pupils are equal, round, and reactive to light.  Neck:     Thyroid: No thyromegaly.     Vascular: No JVD.     Trachea: No tracheal deviation.  Cardiovascular:     Rate and Rhythm: Normal rate and regular rhythm.     Heart sounds: Normal heart sounds.  Pulmonary:     Effort: No respiratory distress.     Breath sounds: No stridor. No wheezing.  Abdominal:     General: Bowel sounds are normal. There is no distension.     Palpations: Abdomen is soft. There  is no mass.     Tenderness: There is no abdominal tenderness. There is no guarding or rebound.  Musculoskeletal:        General: Tenderness present.     Cervical back: Normal range of motion and neck supple. No rigidity.  Lymphadenopathy:     Cervical: No cervical adenopathy.  Skin:    Findings: No erythema or rash.  Neurological:     Mental Status: She is oriented to person, place, and time.     Cranial Nerves: No cranial nerve deficit.     Motor: No abnormal muscle tone.     Coordination: Coordination normal.     Gait: Gait abnormal.     Deep Tendon Reflexes: Reflexes normal.  Psychiatric:        Behavior: Behavior normal.        Thought Content: Thought content normal.        Judgment: Judgment normal.   Antalgic gait  Lab Results  Component Value Date   WBC 5.3 01/13/2022   HGB 11.2 (L) 01/13/2022   HCT 33.9 (L) 01/13/2022   PLT 246.0 01/13/2022   GLUCOSE 85 01/13/2022   CHOL 172 05/27/2010   TRIG 100.0 05/27/2010   HDL 46.30 05/27/2010   LDLCALC 106 (H) 05/27/2010   ALT 12 01/13/2022   AST 19 01/13/2022   NA 139 01/13/2022   K 4.0 01/13/2022   CL 106 01/13/2022   CREATININE 1.23 (H) 01/13/2022   BUN 25 (H) 01/13/2022   CO2 24 01/13/2022   TSH 1.03 05/12/2021   INR 1.0 01/05/2018   HGBA1C 5.9 12/17/2009   MICROALBUR 0.6 02/10/2013    MM 3D SCREEN BREAST BILATERAL  Result Date: 01/28/2022 CLINICAL DATA:  Screening. EXAM: DIGITAL SCREENING BILATERAL MAMMOGRAM WITH TOMOSYNTHESIS AND CAD TECHNIQUE: Bilateral screening digital craniocaudal and mediolateral oblique mammograms were obtained. Bilateral screening digital breast tomosynthesis was performed. The images were evaluated with computer-aided detection. COMPARISON:  Previous exam(s). ACR Breast Density Category a: The breast tissue is almost entirely fatty. FINDINGS: There are no findings suspicious for malignancy. IMPRESSION: No mammographic evidence of malignancy. A result letter of this screening mammogram  will be mailed directly to the patient. RECOMMENDATION: Screening mammogram in one year. (Code:SM-B-01Y) BI-RADS CATEGORY  1: Negative. Electronically Signed   By: Elberta Fortis M.D.   On: 01/28/2022 16:11    Assessment & Plan:   Problem List Items Addressed This Visit     B12 deficiency - Primary    On B12      Vitamin D deficiency    Change to Vit D monthly - hard to swollow  Crohn's disease (HCC)    S/p colectomy/colostomy      LOW BACK PAIN    Flexeril 5 mg at hs  Tramadol       Relevant Medications   cyclobenzaprine (FLEXERIL) 5 MG tablet   traMADol (ULTRAM) 50 MG tablet   Chronic fatigue syndrome    Better      CRI (chronic renal insufficiency)    Hydrate well Monitor GFR      Obesity (BMI 30-39.9)    Phentermine po  Potential benefits of a long term phentermine  use as well as potential risks  and complications were explained to the patient and were aknowledged.       Relevant Medications   phentermine (ADIPEX-P) 37.5 MG tablet      Meds ordered this encounter  Medications   cyclobenzaprine (FLEXERIL) 5 MG tablet    Sig: TAKE 1 TABLET BY MOUTH EVERYDAY AT BEDTIME    Dispense:  30 tablet    Refill:  3   phentermine (ADIPEX-P) 37.5 MG tablet    Sig: Take 1 tablet (37.5 mg total) by mouth daily before breakfast.    Dispense:  30 tablet    Refill:  2   traMADol (ULTRAM) 50 MG tablet    Sig: Take 1 tablet (50 mg total) by mouth every 6 (six) hours as needed.    Dispense:  120 tablet    Refill:  2      Follow-up: Return in about 3 months (around 12/02/2022) for a follow-up visit.  Sonda Primes, MD

## 2022-09-01 NOTE — Assessment & Plan Note (Signed)
Change to Vit D monthly - hard to swollow

## 2022-09-01 NOTE — Assessment & Plan Note (Signed)
S/p colectomy/colostomy 

## 2022-09-01 NOTE — Assessment & Plan Note (Signed)
Better  

## 2022-09-11 DIAGNOSIS — Z432 Encounter for attention to ileostomy: Secondary | ICD-10-CM | POA: Diagnosis not present

## 2022-10-29 ENCOUNTER — Ambulatory Visit (INDEPENDENT_AMBULATORY_CARE_PROVIDER_SITE_OTHER): Payer: Medicare Other

## 2022-10-29 VITALS — Ht 65.0 in | Wt 191.0 lb

## 2022-10-29 DIAGNOSIS — Z0000 Encounter for general adult medical examination without abnormal findings: Secondary | ICD-10-CM | POA: Diagnosis not present

## 2022-10-29 NOTE — Progress Notes (Addendum)
Subjective:   Emily Sanchez is a 67 y.o. female who presents for Medicare Annual (Subsequent) preventive examination.  Visit Complete: Virtual  I connected with  Emily Sanchez on 10/29/22 by a audio enabled telemedicine application and verified that I am speaking with the correct person using two identifiers.  Patient Location: Home  Provider Location: Home Office  I discussed the limitations of evaluation and management by telemedicine. The patient expressed understanding and agreed to proceed.   Review of Systems    Cardiac Risk Factors include: advanced age (>90men, >23 women);obesity (BMI >30kg/m2);hypertension;Other (see comment) (CKD, Crohn's disease.), Risk factor comments: CKD, Crohn's disease.     Objective:    Today's Vitals   10/29/22 1407  Weight: 191 lb (86.6 kg)  Height: 5\' 5"  (1.651 m)   Body mass index is 31.78 kg/m.     10/29/2022    2:28 PM 07/15/2021    6:25 AM 07/08/2021    1:17 PM 01/16/2020    7:55 PM 01/04/2020   11:07 AM 06/22/2017    4:23 PM 06/22/2017    3:36 PM  Advanced Directives  Does Patient Have a Medical Advance Directive? No No No No No No No  Would patient like information on creating a medical advance directive? No - Patient declined No - Patient declined No - Patient declined No - Patient declined  No - Patient declined     Current Medications (verified) Outpatient Encounter Medications as of 10/29/2022  Medication Sig   acetaminophen (TYLENOL) 500 MG tablet Take 2 tablets (1,000 mg total) by mouth every 8 (eight) hours. (Patient taking differently: Take 1,000 mg by mouth every 6 (six) hours as needed for moderate pain.)   cefdinir (OMNICEF) 300 MG capsule Take 1 capsule (300 mg total) by mouth 2 (two) times daily.   cyanocobalamin (,VITAMIN B-12,) 1000 MCG/ML injection Inject 1 mL (1,000 mcg total) into the muscle every 14 (fourteen) days.   cyclobenzaprine (FLEXERIL) 5 MG tablet TAKE 1 TABLET BY MOUTH EVERYDAY AT BEDTIME    pantoprazole (PROTONIX) 40 MG tablet Take 1 tablet (40 mg total) by mouth 2 (two) times daily.   SYRINGE-NEEDLE, DISP, 3 ML (BD INTEGRA SYRINGE) 25G X 1" 3 ML MISC TO BE USED FOR B12 INJECTIONS   traMADol (ULTRAM) 50 MG tablet Take 1 tablet (50 mg total) by mouth every 6 (six) hours as needed.   Cholecalciferol (VITAMIN D-3) 5000 UNIT/ML LIQD Place 10,000 Units under the tongue once a week. (Patient not taking: Reported on 10/29/2022)   meloxicam (MOBIC) 7.5 MG tablet TAKE 1 TABLET BY MOUTH EVERY DAY (Patient not taking: Reported on 10/29/2022)   phentermine (ADIPEX-P) 37.5 MG tablet Take 1 tablet (37.5 mg total) by mouth daily before breakfast.   No facility-administered encounter medications on file as of 10/29/2022.    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    GERD (gastroesophageal reflux disease)    HTN (hypertension)    lst blood pressure medication was 3 months ago    Ileostomy in place [facility])    Low back pain    FMS   Osteoarthritis    Dr Charlann Boxer;  both knees   Perianal pain    Chronic, Post-Op   Transfusion history    none recent   Ulcerative colitis (HCC)    Dr Jarold Motto   Past Surgical History:  Procedure Laterality Date   BREATH Katherina Mires PYLORI N/A 12/05/2013   Procedure: BREATH TEK Richardo Priest;  Surgeon: Valarie Merino, MD;  Location: [facility];  Service: General;  Laterality: N/A;   COLECTOMY  04/21/1999   ulcerative colitis   ILEOSTOMY  2011   INJECTION KNEE Left 01/16/2020   Procedure: KNEE INJECTION;  Surgeon: Durene Romans, MD;  Location: [facility];  Service: Orthopedics;  Laterality: Left;   LAPAROSCOPIC GASTRIC  SLEEVE RESECTION WITH HIATAL HERNIA REPAIR N/A 04/02/2015   Procedure: LAPAROSCOPIC LYSIS OF ADHESIONS GASTRIC SLEEVE RESECTION WITH OPEN VENTRAL HERNIA REPAIR;  Surgeon: Luretha Murphy, MD;  Location: [facility];  Service: General;  Laterality: N/A;   LAPAROSCOPIC LYSIS OF ADHESIONS  04/02/2015   Procedure: LAPAROSCOPIC LYSIS OF ADHESIONS;  Surgeon: Luretha Murphy, MD;  Location: [facility];  Service: General;;   LAPAROTOMY N/A 06/11/2017   Procedure: EXPLORATORY LAPAROTOMY, LYSIS OF ADHESIONS, REPAIR OF PEISTOMAL HERNIA;  Surgeon: Romie Levee, MD;  Location: [facility];  Service: General;  Laterality: N/A;   TONSILLECTOMY     as a child   TOTAL KNEE ARTHROPLASTY Right 01/16/2020   Procedure: RIGHT TOTAL KNEE ARTHROPLASTY;  Surgeon: Durene Romans, MD;  Location: [facility];  Service: Orthopedics;  Laterality: Right;  70 mins   TOTAL KNEE ARTHROPLASTY Left 07/15/2021   Procedure: TOTAL KNEE ARTHROPLASTY;  Surgeon: Durene Romans, MD;  Location: [facility];  Service: Orthopedics;  Laterality: Left;   TUBAL LIGATION  1983   UPPER GI ENDOSCOPY  04/02/2015   Procedure: UPPER GI ENDOSCOPY;  Surgeon: Luretha Murphy, MD;  Location: [facility];  Service: General;;   VENTRAL HERNIA REPAIR  04/02/2015   Procedure: HERNIA REPAIR VENTRAL ADULT;  Surgeon: Luretha Murphy, MD;  Location: [facility];  Service: General;;   Family History  Problem Relation Age of Onset   Heart disease Father 33       CHF   Depression Sister    Hypertension Other    Social History   Socioeconomic History   Marital status: Widowed    Spouse name: Not on file   Number of children: 1   Years of education: 14   Highest education level: Not on file  Occupational History   Not on file  Tobacco Use   Smoking status: Former    Current packs/day: 0.00    Types: Cigarettes    Start date: 44    Quit date: 84    Years since quitting: 49.5   Smokeless tobacco: Never   Tobacco comments:    very light social only-quit many years ago  Vaping Use    Vaping status: Never Used  Substance and Sexual Activity   Alcohol use: No   Drug use: No   Sexual activity: Yes  Other Topics Concern   Not on file  Social History Narrative   Married. 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)   Social Determinants of Health   Financial Resource Strain: Low Risk  (10/29/2022)   Overall Financial Resource Strain (CARDIA)    Difficulty of Paying Living Expenses: Not very hard  Food Insecurity: No Food Insecurity (10/29/2022)   Hunger Vital Sign    Worried About Running Out of Food in the Last Year: Never true    Ran Out of Food in the Last Year:  Never true  Transportation Needs: No Transportation Needs (10/29/2022)   PRAPARE - Administrator, Civil Service (Medical): No    Lack of Transportation (Non-Medical): No  Physical Activity: Sufficiently Active (10/29/2022)   Exercise Vital Sign    Days of Exercise per Week: 3 days    Minutes of Exercise per Session: 60 min  Stress: Stress Concern Present (10/29/2022)   [employer] of Occupational Health - Occupational Stress Questionnaire    Feeling of Stress : To some extent  Social Connections: Moderately Integrated (10/29/2022)   Social Connection and Isolation Panel [NHANES]    Frequency of Communication with Friends and Family: More than three times a week    Frequency of Social Gatherings with Friends and Family: More than three times a week    Attends Religious Services: More than 4 times per year    Active Member of [employer] or Organizations: Yes    Attends Banker Meetings: 1 to 4 times per year    Marital Status: Widowed    Tobacco Counseling Counseling given: Not Answered Tobacco comments: very light social only-quit many years ago   Clinical Intake:  Pre-visit preparation completed: Yes  Pain : No/denies pain     BMI - recorded: 31.78 Nutritional Status: BMI > 30  Obese Nutritional Risks: None Diabetes: No  How often do you need to have someone help you when  you read instructions, pamphlets, or other written materials from your doctor or pharmacy?: 1 - Never  Interpreter Needed?: No  Information entered by :: Tascha Casares, RMA   Activities of Daily Living    10/29/2022    2:16 PM  In your present state of health, do you have any difficulty performing the following activities:  Hearing? 0  Vision? 0  Difficulty concentrating or making decisions? 0  Walking or climbing stairs? 1  Comment sometimes when walking due to feet swelling/per patient.  Dressing or bathing? 0  Doing errands, shopping? 0  Preparing Food and eating ? N  Using the Toilet? N  In the past six months, have you accidently leaked urine? N  Do you have problems with loss of bowel control? N  Comment Patient has a ostomy bag  Managing your Medications? N  Managing your Finances? N  Housekeeping or managing your Housekeeping? N    Patient Care Team: Plotnikov, Georgina Quint, MD as PCP - General Mardella Layman, MD (Gastroenterology) Durene Romans, MD (Orthopedic Surgery) Romie Levee, MD as Consulting Physician (General Surgery)  Indicate any recent Medical Services you may have received from other than [facility] providers in the past year (date may be approximate).     Assessment:   This is a routine wellness examination for Emily Sanchez.  Hearing/Vision screen Hearing Screening - Comments:: Denies hearing difficulties    Dietary issues and exercise activities discussed:     Goals Addressed             This Visit's Progress    Be more physically active   On track    I want join [employer] and go 1-2 times weekly      Depression Screen    10/29/2022    2:39 PM 06/03/2022    2:29 PM 01/13/2022   11:32 AM 10/13/2021   11:24 AM 05/12/2021    3:33 PM 02/16/2017   10:19 AM 05/29/2015   12:12 PM  PHQ 2/9 Scores  PHQ - 2 Score 2 0 1 0 0 1 0  PHQ- 9  Score 7  4  5 4      Fall Risk    10/29/2022    2:29 PM 06/03/2022    2:28 PM 10/13/2021   11:24 AM  05/12/2021    3:31 PM 02/16/2017   10:19 AM  Fall Risk   Falls in the past year? 1 0 0 1 Yes  Comment Tripped going down steps on the front porch.      Number falls in past yr: 0 0 0 1 1  Injury with Fall? 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 Injured her back   Risk for fall due to : Medication side effect No Fall Risks No Fall Risks History of fall(s);Impaired balance/gait   Follow up Falls prevention discussed Falls evaluation completed Falls evaluation completed  Falls prevention discussed    MEDICARE RISK AT HOME:  Medicare Risk at Home - 10/29/22 1431     Any stairs in or around the home? Yes    If so, are there any without handrails? Yes    Home free of loose throw rugs in walkways, pet beds, electrical cords, etc? Yes    Adequate lighting in your home to reduce risk of falls? Yes    Life alert? No    Use of a cane, walker or w/c? No    Grab bars in the bathroom? No    Shower chair or bench in shower? Yes    Elevated toilet seat or a handicapped toilet? Yes             TIMED UP AND GO:  Was the test performed?  No    Cognitive Function:        10/29/2022    2:32 PM  6CIT Screen  What Year? 0 points  What month? 0 points  What time? 0 points  Count back from 20 0 points  Months in reverse 2 points  Repeat phrase 8 points  Total Score 10 points    Immunizations Immunization History  Administered Date(s) Administered   Fluad Quad(high Dose 65+) 05/02/2021, 01/13/2022   Influenza Inj Mdck Quad With Preservative 02/14/2019   Influenza Split 01/06/2011, 01/26/2012   Influenza Whole 01/20/2008, 02/14/2009, 12/25/2009   Influenza,inj,Quad PF,6+ Mos 02/16/2013, 02/14/2014, 02/27/2015, 12/10/2015, 02/16/2017, 02/10/2018, 01/12/2019, 01/09/2020   PFIZER Comirnaty(Gray Top)Covid-19 Tri-Sucrose Vaccine 05/14/2020   Pfizer Covid-19 Vaccine Bivalent Booster 71yrs & up  05/17/2021   Pneumococcal Conjugate-13 06/24/2016   Pneumococcal Polysaccharide-23 05/15/2013   Td 05/17/2014   Td (Adult), 2 Lf Tetanus Toxid, Preservative Free 05/17/2014    TDAP status: Up to date  Flu Vaccine status: Up to date  Pneumococcal vaccine status: Due, Education has been provided regarding the importance of this vaccine. Advised may receive this vaccine at local pharmacy or Health Dept. Aware to provide a copy of the vaccination record if obtained from local pharmacy or Health Dept. Verbalized acceptance and understanding.  Covid-19 vaccine status: Completed vaccines  Qualifies for Shingles Vaccine? Yes   Zostavax completed No   Shingrix Completed?: No.    Education has been provided regarding the importance of this vaccine. Patient has been advised to call insurance company to determine out of pocket expense if they have not yet received this vaccine. Advised may also receive vaccine at local pharmacy or Health Dept. Verbalized acceptance and understanding.  Screening Tests Health Maintenance  Topic Date  Due   Colonoscopy  Never done   DEXA SCAN  Never done   Pneumonia Vaccine 55+ Years old (3 of 3 - PPSV23 or PCV20) 06/24/2021   COVID-19 Vaccine (3 - 2023-24 season) 12/19/2021   INFLUENZA VACCINE  11/19/2022   Medicare Annual Wellness (AWV)  10/29/2023   MAMMOGRAM  01/29/2024   DTaP/Tdap/Td (2 - Tdap) 05/17/2024   Hepatitis C Screening  Completed   HPV VACCINES  Aged Out   Zoster Vaccines- Shingrix  Discontinued    Health Maintenance  Health Maintenance Due  Topic Date Due   Colonoscopy  Never done   DEXA SCAN  Never done   Pneumonia Vaccine 28+ Years old (3 of 3 - PPSV23 or PCV20) 06/24/2021   COVID-19 Vaccine (3 - 2023-24 season) 12/19/2021    Colorectal cancer screening: No longer required.   Mammogram status: Completed 01/28/2022. Repeat every year.  Bone Density status: Ordered 10/29/2022. Pt provided with contact info and advised to call to  schedule appt.  Lung Cancer Screening: (Low Dose CT Chest recommended if Age 64-80 years, 20 pack-year currently smoking OR have quit w/in 15years.) does not qualify.   Lung Cancer Screening Referral: N/A  Additional Screening:  Hepatitis C Screening: does qualify; Completed 03/02/2021.  Vision Screening: Recommended annual ophthalmology exams for early detection of glaucoma and other disorders of the eye. Is the patient up to date with their annual eye exam?  No  Who is the provider or what is the name of the office in which the patient attends annual eye exams? [facility] If pt is not established with a provider, would they like to be referred to a provider to establish care? No .   Dental Screening: Recommended annual dental exams for proper oral hygiene   Community Resource Referral / Chronic Care Management: CRR required this visit?  No   CCM required this visit?  No     Plan:     I have personally reviewed and noted the following in the patient's chart:   Medical and social history Use of alcohol, tobacco or illicit drugs  Current medications and supplements including opioid prescriptions. Patient is not currently taking opioid prescriptions. Functional ability and status Nutritional status Physical activity Advanced directives List of other physicians Hospitalizations, surgeries, and ER visits in previous 12 months Vitals Screenings to include cognitive, depression, and falls Referrals and appointments  In addition, I have reviewed and discussed with patient certain preventive protocols, quality metrics, and best practice recommendations. A written personalized care plan for preventive services as well as general preventive health recommendations were provided to patient.     Emily Sanchez, CMA   10/29/2022   After Visit Summary: (Mail) Due to this being a telephonic visit, the after visit summary with patients personalized plan was offered to patient via  mail   Nurse Notes: Patient need to schedule an appointment with foot doctor, Dr. Charlann Boxer.  She also need to schedule for her annual eye exam.    Medical screening examination/treatment/procedure(s) were performed by non-physician practitioner and as supervising physician I was immediately available for consultation/collaboration.  I agree with above. Jacinta Shoe, MD

## 2022-10-29 NOTE — Patient Instructions (Signed)
Emily Sanchez , Thank you for taking time to come for your Medicare Wellness Visit. I appreciate your ongoing commitment to your health goals. Please review the following plan we discussed and let me know if I can assist you in the future.   These are the goals we discussed:  Goals      Be more physically active     I want join [employer] and go 1-2 times weekly        This is a list of the screening recommended for you and due dates:  Health Maintenance  Topic Date Due   Colon Cancer Screening  Never done   DEXA scan (bone density measurement)  Never done   Pneumonia Vaccine (3 of 3 - PPSV23 or PCV20) 06/24/2021   COVID-19 Vaccine (3 - 2023-24 season) 12/19/2021   Flu Shot  11/19/2022   Medicare Annual Wellness Visit  10/29/2023   Mammogram  01/29/2024   DTaP/Tdap/Td vaccine (2 - Tdap) 05/17/2024   Hepatitis C Screening  Completed   HPV Vaccine  Aged Out   Zoster (Shingles) Vaccine  Discontinued    Advanced directives: Advance directive discussed with you today. Even though you declined this today, please call our office should you change your mind, and we can give you the proper paperwork for you to fill out.   Conditions/risks identified: Call and schedule an appointment with Dr. Charlann Boxer to get feet checked out.  Also call [facility] to schedule your yearly eye exam.  Each day, aim for 6 glasses of water, plenty of protein in your diet and try to get up and walk/ stretch every hour for 5-10 minutes at a time.    Next appointment: Follow up in one year for your annual wellness visit    Preventive Care 65 Years and Older, Female Preventive care refers to lifestyle choices and visits with your health care provider that can promote health and wellness. What does preventive care include? A yearly physical exam. This is also called an annual well check. Dental exams once or twice a year. Routine eye exams. Ask your health care provider how often you should have your eyes  checked. Personal lifestyle choices, including: Daily care of your teeth and gums. Regular physical activity. Eating a healthy diet. Avoiding tobacco and drug use. Limiting alcohol use. Practicing safe sex. Taking low-dose aspirin every day. Taking vitamin and mineral supplements as recommended by your health care provider. What happens during an annual well check? The services and screenings done by your health care provider during your annual well check will depend on your age, overall health, lifestyle risk factors, and family history of disease. Counseling  Your health care provider may ask you questions about your: Alcohol use. Tobacco use. Drug use. Emotional well-being. Home and relationship well-being. Sexual activity. Eating habits. History of falls. Memory and ability to understand (cognition). Work and work Astronomer. Reproductive health. Screening  You may have the following tests or measurements: Height, weight, and BMI. Blood pressure. Lipid and cholesterol levels. These may be checked every 5 years, or more frequently if you are over 28 years old. Skin check. Lung cancer screening. You may have this screening every year starting at age 20 if you have a 30-pack-year history of smoking and currently smoke or have quit within the past 15 years. Fecal occult blood test (FOBT) of the stool. You may have this test every year starting at age 59. Flexible sigmoidoscopy or colonoscopy. You may have a sigmoidoscopy every  5 years or a colonoscopy every 10 years starting at age 57. Hepatitis C blood test. Hepatitis B blood test. Sexually transmitted disease (STD) testing. Diabetes screening. This is done by checking your blood sugar (glucose) after you have not eaten for a while (fasting). You may have this done every 1-3 years. Bone density scan. This is done to screen for osteoporosis. You may have this done starting at age 60. Mammogram. This may be done every 1-2  years. Talk to your health care provider about how often you should have regular mammograms. Talk with your health care provider about your test results, treatment options, and if necessary, the need for more tests. Vaccines  Your health care provider may recommend certain vaccines, such as: Influenza vaccine. This is recommended every year. Tetanus, diphtheria, and acellular pertussis (Tdap, Td) vaccine. You may need a Td booster every 10 years. Zoster vaccine. You may need this after age 34. Pneumococcal 13-valent conjugate (PCV13) vaccine. One dose is recommended after age 40. Pneumococcal polysaccharide (PPSV23) vaccine. One dose is recommended after age 64. Talk to your health care provider about which screenings and vaccines you need and how often you need them. This information is not intended to replace advice given to you by your health care provider. Make sure you discuss any questions you have with your health care provider. Document Released: 05/03/2015 Document Revised: 12/25/2015 Document Reviewed: 02/05/2015 Elsevier Interactive Patient Education  2017 [employer].  Fall Prevention in the Home Falls can cause injuries. They can happen to people of all ages. There are many things you can do to make your home safe and to help prevent falls. What can I do on the outside of my home? Regularly fix the edges of walkways and driveways and fix any cracks. Remove anything that might make you trip as you walk through a door, such as a raised step or threshold. Trim any bushes or trees on the path to your home. Use bright outdoor lighting. Clear any walking paths of anything that might make someone trip, such as rocks or tools. Regularly check to see if handrails are loose or broken. Make sure that both sides of any steps have handrails. Any raised decks and porches should have guardrails on the edges. Have any leaves, snow, or ice cleared regularly. Use sand or salt on walking paths  during winter. Clean up any spills in your garage right away. This includes oil or grease spills. What can I do in the bathroom? Use night lights. Install grab bars by the toilet and in the tub and shower. Do not use towel bars as grab bars. Use non-skid mats or decals in the tub or shower. If you need to sit down in the shower, use a plastic, non-slip stool. Keep the floor dry. Clean up any water that spills on the floor as soon as it happens. Remove soap buildup in the tub or shower regularly. Attach bath mats securely with double-sided non-slip rug tape. Do not have throw rugs and other things on the floor that can make you trip. What can I do in the bedroom? Use night lights. Make sure that you have a light by your bed that is easy to reach. Do not use any sheets or blankets that are too big for your bed. They should not hang down onto the floor. Have a firm chair that has side arms. You can use this for support while you get dressed. Do not have throw rugs and other things on  the floor that can make you trip. What can I do in the kitchen? Clean up any spills right away. Avoid walking on wet floors. Keep items that you use a lot in easy-to-reach places. If you need to reach something above you, use a strong step stool that has a grab bar. Keep electrical cords out of the way. Do not use floor polish or wax that makes floors slippery. If you must use wax, use non-skid floor wax. Do not have throw rugs and other things on the floor that can make you trip. What can I do with my stairs? Do not leave any items on the stairs. Make sure that there are handrails on both sides of the stairs and use them. Fix handrails that are broken or loose. Make sure that handrails are as long as the stairways. Check any carpeting to make sure that it is firmly attached to the stairs. Fix any carpet that is loose or worn. Avoid having throw rugs at the top or bottom of the stairs. If you do have throw  rugs, attach them to the floor with carpet tape. Make sure that you have a light switch at the top of the stairs and the bottom of the stairs. If you do not have them, ask someone to add them for you. What else can I do to help prevent falls? Wear shoes that: Do not have high heels. Have rubber bottoms. Are comfortable and fit you well. Are closed at the toe. Do not wear sandals. If you use a stepladder: Make sure that it is fully opened. Do not climb a closed stepladder. Make sure that both sides of the stepladder are locked into place. Ask someone to hold it for you, if possible. Clearly mark and make sure that you can see: Any grab bars or handrails. First and last steps. Where the edge of each step is. Use tools that help you move around (mobility aids) if they are needed. These include: Canes. Walkers. Scooters. Crutches. Turn on the lights when you go into a dark area. Replace any light bulbs as soon as they burn out. Set up your furniture so you have a clear path. Avoid moving your furniture around. If any of your floors are uneven, fix them. If there are any pets around you, be aware of where they are. Review your medicines with your doctor. Some medicines can make you feel dizzy. This can increase your chance of falling. Ask your doctor what other things that you can do to help prevent falls. This information is not intended to replace advice given to you by your health care provider. Make sure you discuss any questions you have with your health care provider. Document Released: 01/31/2009 Document Revised: 09/12/2015 Document Reviewed: 05/11/2014 Elsevier Interactive Patient Education  2017 [employer].

## 2022-11-04 DIAGNOSIS — M7989 Other specified soft tissue disorders: Secondary | ICD-10-CM | POA: Insufficient documentation

## 2022-11-04 DIAGNOSIS — M79672 Pain in left foot: Secondary | ICD-10-CM | POA: Diagnosis not present

## 2022-11-04 DIAGNOSIS — G7249 Other inflammatory and immune myopathies, not elsewhere classified: Secondary | ICD-10-CM | POA: Diagnosis not present

## 2022-11-04 DIAGNOSIS — M79671 Pain in right foot: Secondary | ICD-10-CM | POA: Diagnosis not present

## 2022-11-16 DIAGNOSIS — E079 Disorder of thyroid, unspecified: Secondary | ICD-10-CM | POA: Diagnosis not present

## 2022-11-16 DIAGNOSIS — G7249 Other inflammatory and immune myopathies, not elsewhere classified: Secondary | ICD-10-CM | POA: Diagnosis not present

## 2022-11-25 ENCOUNTER — Telehealth: Payer: Self-pay | Admitting: Internal Medicine

## 2022-11-25 NOTE — Telephone Encounter (Signed)
Patient is having trouble walking and wants to know if there is anything you can call in.  Please call patient and advise.    [phone] - redness in feet and pain

## 2022-11-29 NOTE — Telephone Encounter (Signed)
Please schedule office visit with any provider ASAP. Thank you

## 2022-12-02 ENCOUNTER — Encounter: Payer: Self-pay | Admitting: Internal Medicine

## 2022-12-02 ENCOUNTER — Ambulatory Visit (INDEPENDENT_AMBULATORY_CARE_PROVIDER_SITE_OTHER): Payer: Medicare Other | Admitting: Internal Medicine

## 2022-12-02 VITALS — BP 172/96 | HR 42 | Temp 98.6°F | Ht 65.0 in | Wt 189.0 lb

## 2022-12-02 DIAGNOSIS — M1A0790 Idiopathic chronic gout, unspecified ankle and foot, without tophus (tophi): Secondary | ICD-10-CM | POA: Diagnosis not present

## 2022-12-02 DIAGNOSIS — K50118 Crohn's disease of large intestine with other complication: Secondary | ICD-10-CM | POA: Diagnosis not present

## 2022-12-02 DIAGNOSIS — E559 Vitamin D deficiency, unspecified: Secondary | ICD-10-CM

## 2022-12-02 DIAGNOSIS — N1832 Chronic kidney disease, stage 3b: Secondary | ICD-10-CM | POA: Diagnosis not present

## 2022-12-02 DIAGNOSIS — F419 Anxiety disorder, unspecified: Secondary | ICD-10-CM | POA: Diagnosis not present

## 2022-12-02 DIAGNOSIS — L819 Disorder of pigmentation, unspecified: Secondary | ICD-10-CM | POA: Diagnosis not present

## 2022-12-02 LAB — CBC WITH DIFFERENTIAL/PLATELET
Basophils Absolute: 0 10*3/uL (ref 0.0–0.1)
Basophils Relative: 1 % (ref 0.0–3.0)
Eosinophils Absolute: 0.1 10*3/uL (ref 0.0–0.7)
Eosinophils Relative: 1.4 % (ref 0.0–5.0)
HCT: 39.7 % (ref 36.0–46.0)
Hemoglobin: 12.7 g/dL (ref 12.0–15.0)
Lymphocytes Relative: 48.1 % — ABNORMAL HIGH (ref 12.0–46.0)
Lymphs Abs: 2 10*3/uL (ref 0.7–4.0)
MCHC: 31.9 g/dL (ref 30.0–36.0)
MCV: 97.3 fl (ref 78.0–100.0)
Monocytes Absolute: 0.3 10*3/uL (ref 0.1–1.0)
Monocytes Relative: 6 % (ref 3.0–12.0)
Neutro Abs: 1.8 10*3/uL (ref 1.4–7.7)
Neutrophils Relative %: 43.5 % (ref 43.0–77.0)
Platelets: 339 10*3/uL (ref 150.0–400.0)
RBC: 4.08 Mil/uL (ref 3.87–5.11)
RDW: 16.8 % — ABNORMAL HIGH (ref 11.5–15.5)
WBC: 4.2 10*3/uL (ref 4.0–10.5)

## 2022-12-02 LAB — SEDIMENTATION RATE: Sed Rate: 115 mm/hr — ABNORMAL HIGH (ref 0–30)

## 2022-12-02 LAB — VITAMIN B12: Vitamin B-12: 642 pg/mL (ref 211–911)

## 2022-12-02 LAB — VITAMIN D 25 HYDROXY (VIT D DEFICIENCY, FRACTURES): VITD: 14.94 ng/mL — ABNORMAL LOW (ref 30.00–100.00)

## 2022-12-02 LAB — URIC ACID: Uric Acid, Serum: 11.5 mg/dL — ABNORMAL HIGH (ref 2.4–7.0)

## 2022-12-02 LAB — TSH: TSH: 0.86 u[IU]/mL (ref 0.35–5.50)

## 2022-12-02 MED ORDER — AMLODIPINE BESYLATE 5 MG PO TABS
5.0000 mg | ORAL_TABLET | Freq: Every day | ORAL | 11 refills | Status: DC
Start: 2022-12-02 — End: 2023-12-10

## 2022-12-02 MED ORDER — COLCHICINE 0.6 MG PO TABS
ORAL_TABLET | ORAL | 1 refills | Status: AC
Start: 2022-12-02 — End: ?

## 2022-12-02 MED ORDER — METHYLPREDNISOLONE 4 MG PO TBPK: ORAL_TABLET | ORAL | 0 refills | Status: DC

## 2022-12-02 MED ORDER — ASPIRIN 81 MG PO TBEC: 81.0000 mg | DELAYED_RELEASE_TABLET | Freq: Every day | ORAL | Status: AC

## 2022-12-02 MED ORDER — ALLOPURINOL 100 MG PO TABS: 50.0000 mg | ORAL_TABLET | Freq: Every day | ORAL | 3 refills | Status: AC

## 2022-12-02 NOTE — Assessment & Plan Note (Addendum)
New swelling and discoloration of R toes #2-3 and L foot toes #2-3, B lateral feet swelling, R 3d finger discolored, swollen for several days x 3 episodes since last year... Check labs - ANA, RF, SPEP and others Start baby ASA if tolerated  Possibly due to gout:  Gout attack-toes, finger.  Elevated uric acid level Start Medrol pack Start allopurinol, as needed colchicine

## 2022-12-02 NOTE — Assessment & Plan Note (Signed)
Gout attack-toes, finger.  Elevated uric acid level Start Medrol pack Start allopurinol, as needed colchicine

## 2022-12-02 NOTE — Assessment & Plan Note (Signed)
Doing well at present 

## 2022-12-02 NOTE — Assessment & Plan Note (Signed)
Check GFR 

## 2022-12-02 NOTE — Assessment & Plan Note (Signed)
Colostomy - fluid/stool New swelling and discoloration of R toes #2-3 and L foot toes #2-3, B lateral feet swelling, R 3d finger discolored, swollen for several days x 3 episodes since last year.Emily KitchenMarland Sanchez

## 2022-12-02 NOTE — Progress Notes (Addendum)
Subjective:  Patient ID: Emily Sanchez, female    DOB: 05-Jul-1955  Age: 67 y.o. MRN: [id number]  CC: Follow-up (3 mnth f/u)   HPI Emily Sanchez presents for swelling and discoloration of R toes #2-3 and L foot toes #2-3, B lateral feet swelling, R 3d finger discolored, swollen for several days x 3 episodes since last year...  Outpatient Medications Prior to Visit  Medication Sig Dispense Refill   acetaminophen (TYLENOL) 500 MG tablet Take 2 tablets (1,000 mg total) by mouth every 8 (eight) hours. (Patient taking differently: Take 1,000 mg by mouth every 6 (six) hours as needed for moderate pain.) 30 tablet 0   cefdinir (OMNICEF) 300 MG capsule Take 1 capsule (300 mg total) by mouth 2 (two) times daily. 20 capsule 0   cyanocobalamin (,VITAMIN B-12,) 1000 MCG/ML injection Inject 1 mL (1,000 mcg total) into the muscle every 14 (fourteen) days. 20 mL 3   cyclobenzaprine (FLEXERIL) 5 MG tablet TAKE 1 TABLET BY MOUTH EVERYDAY AT BEDTIME 30 tablet 3   pantoprazole (PROTONIX) 40 MG tablet Take 1 tablet (40 mg total) by mouth 2 (two) times daily. 180 tablet 3   SYRINGE-NEEDLE, DISP, 3 ML (BD INTEGRA SYRINGE) 25G X 1" 3 ML MISC TO BE USED FOR B12 INJECTIONS 50 each 3   traMADol (ULTRAM) 50 MG tablet Take 1 tablet (50 mg total) by mouth every 6 (six) hours as needed. 120 tablet 2   Cholecalciferol (VITAMIN D-3) 5000 UNIT/ML LIQD Place 10,000 Units under the tongue once a week. (Patient not taking: Reported on 10/29/2022) 30 mL 5   meloxicam (MOBIC) 7.5 MG tablet TAKE 1 TABLET BY MOUTH EVERY DAY (Patient not taking: Reported on 10/29/2022) 30 tablet 1   phentermine (ADIPEX-P) 37.5 MG tablet Take 1 tablet (37.5 mg total) by mouth daily before breakfast. 30 tablet 2   No facility-administered medications prior to visit.    ROS: Review of Systems  Constitutional:  Positive for fatigue. Negative for activity change, appetite change, chills and unexpected weight change.  HENT:  Negative  for congestion, mouth sores and sinus pressure.   Eyes:  Negative for visual disturbance.  Respiratory:  Negative for cough and chest tightness.   Gastrointestinal:  Negative for abdominal pain and nausea.  Genitourinary:  Negative for difficulty urinating, frequency and vaginal pain.  Musculoskeletal:  Positive for arthralgias, back pain and gait problem.  Skin:  Positive for color change. Negative for pallor and rash.  Neurological:  Negative for dizziness, tremors, weakness, numbness and headaches.  Psychiatric/Behavioral:  Negative for confusion and sleep disturbance.     Objective:  BP (!) 172/96 (BP Location: Left Arm, Patient Position: Sitting, Cuff Size: Normal)   Pulse (!) 42   Temp 98.6 F (37 C) (Oral)   Ht 5\' 5"  (1.651 m)   Wt 189 lb (85.7 kg)   SpO2 94%   BMI 31.45 kg/m   BP Readings from Last 3 Encounters:  12/02/22 (!) 172/96  09/01/22 130/78  06/03/22 (!) 162/110    Wt Readings from Last 3 Encounters:  12/02/22 189 lb (85.7 kg)  10/29/22 191 lb (86.6 kg)  09/01/22 191 lb (86.6 kg)    Physical Exam Constitutional:      General: She is not in acute distress.    Appearance: Normal appearance. She is well-developed.  HENT:     Head: Normocephalic.     Right Ear: External ear normal.     Left Ear: External ear normal.  Nose: Nose normal.  Eyes:     General:        Right eye: No discharge.        Left eye: No discharge.     Conjunctiva/sclera: Conjunctivae normal.     Pupils: Pupils are equal, round, and reactive to light.  Neck:     Thyroid: No thyromegaly.     Vascular: No JVD.     Trachea: No tracheal deviation.  Cardiovascular:     Rate and Rhythm: Normal rate and regular rhythm.     Heart sounds: Normal heart sounds.  Pulmonary:     Effort: No respiratory distress.     Breath sounds: No stridor. No wheezing.  Abdominal:     General: Bowel sounds are normal. There is no distension.     Palpations: Abdomen is soft. There is no mass.      Tenderness: There is no abdominal tenderness. There is no guarding or rebound.  Musculoskeletal:        General: No tenderness.     Cervical back: Normal range of motion and neck supple. No rigidity.  Lymphadenopathy:     Cervical: No cervical adenopathy.  Skin:    Findings: No erythema or rash.  Neurological:     Cranial Nerves: No cranial nerve deficit.     Motor: No abnormal muscle tone.     Coordination: Coordination normal.     Deep Tendon Reflexes: Reflexes normal.  Psychiatric:        Behavior: Behavior normal.        Thought Content: Thought content normal.        Judgment: Judgment normal.     Lab Results  Component Value Date   WBC 4.2 12/02/2022   HGB 12.7 12/02/2022   HCT 39.7 12/02/2022   PLT 339.0 12/02/2022   GLUCOSE 85 01/13/2022   CHOL 172 05/27/2010   TRIG 100.0 05/27/2010   HDL 46.30 05/27/2010   LDLCALC 106 (H) 05/27/2010   ALT 12 01/13/2022   AST 19 01/13/2022   NA 139 01/13/2022   K 4.0 01/13/2022   CL 106 01/13/2022   CREATININE 1.23 (H) 01/13/2022   BUN 25 (H) 01/13/2022   CO2 24 01/13/2022   TSH 0.86 12/02/2022   INR 1.0 01/05/2018   HGBA1C 5.9 12/17/2009   MICROALBUR 0.6 02/10/2013    MM 3D SCREEN BREAST BILATERAL  Result Date: 01/28/2022 CLINICAL DATA:  Screening. EXAM: DIGITAL SCREENING BILATERAL MAMMOGRAM WITH TOMOSYNTHESIS AND CAD TECHNIQUE: Bilateral screening digital craniocaudal and mediolateral oblique mammograms were obtained. Bilateral screening digital breast tomosynthesis was performed. The images were evaluated with computer-aided detection. COMPARISON:  Previous exam(s). ACR Breast Density Category a: The breast tissue is almost entirely fatty. FINDINGS: There are no findings suspicious for malignancy. IMPRESSION: No mammographic evidence of malignancy. A result letter of this screening mammogram will be mailed directly to the patient. RECOMMENDATION: Screening mammogram in one year. (Code:SM-B-01Y) BI-RADS CATEGORY  1: Negative.  Electronically Signed   By: Elberta Fortis M.D.   On: 01/28/2022 16:11    Assessment & Plan:   Problem List Items Addressed This Visit     Vitamin D deficiency    Worse.  Restart prescription/OTC vitamin D      Anxiety disorder    Doing well at present      Crohn's disease (HCC)    Colostomy - fluid/stool New swelling and discoloration of R toes #2-3 and L foot toes #2-3, B lateral feet swelling, R 3d finger discolored, swollen  for several days x 3 episodes since last year...       Relevant Orders   VAS [place] LOWER EXTREMITY ARTERIAL DUPLEX   CBC with Differential/Platelet (Completed)   TSH (Completed)   Vitamin B12 (Completed)   VITAMIN D 25 Hydroxy (Vit-D Deficiency, Fractures) (Completed)   Rheumatoid factor   Sedimentation rate (Completed)   Uric acid (Completed)   Antinuclear Antib (ANA)   CRI (chronic renal insufficiency)    Check GFR      Relevant Orders   VAS [place] LOWER EXTREMITY ARTERIAL DUPLEX   CBC with Differential/Platelet (Completed)   TSH (Completed)   Vitamin B12 (Completed)   VITAMIN D 25 Hydroxy (Vit-D Deficiency, Fractures) (Completed)   Rheumatoid factor   Sedimentation rate (Completed)   Uric acid (Completed)   Antinuclear Antib (ANA)   Protein electrophoresis, serum   Gout    Gout attack-toes, finger.  Elevated uric acid level Start Medrol pack Start allopurinol, as needed colchicine      Discoloration of skin of multiple sites - Primary    New swelling and discoloration of R toes #2-3 and L foot toes #2-3, B lateral feet swelling, R 3d finger discolored, swollen for several days x 3 episodes since last year... Check labs - ANA, RF, SPEP and others Start baby ASA if tolerated  Possibly due to gout:  Gout attack-toes, finger.  Elevated uric acid level Start Medrol pack Start allopurinol, as needed colchicine      Relevant Orders   VAS [place] LOWER EXTREMITY ARTERIAL DUPLEX   CBC with Differential/Platelet (Completed)   TSH (Completed)    Vitamin B12 (Completed)   VITAMIN D 25 Hydroxy (Vit-D Deficiency, Fractures) (Completed)   Rheumatoid factor   Sedimentation rate (Completed)   Uric acid (Completed)   Antinuclear Antib (ANA)   Protein electrophoresis, serum      Meds ordered this encounter  Medications   aspirin EC 81 MG tablet    Sig: Take 1 tablet (81 mg total) by mouth daily.   methylPREDNISolone (MEDROL DOSEPAK) 4 MG TBPK tablet    Sig: As directed    Dispense:  21 tablet    Refill:  0   amLODipine (NORVASC) 5 MG tablet    Sig: Take 1 tablet (5 mg total) by mouth daily.    Dispense:  30 tablet    Refill:  11   colchicine 0.6 MG tablet    Sig: Take two tablets prn gout attack.Then take another one in 1-2 hrs. Do not repeat for 3 days.    Dispense:  18 tablet    Refill:  1   allopurinol (ZYLOPRIM) 100 MG tablet    Sig: Take 0.5 tablets (50 mg total) by mouth daily.    Dispense:  45 tablet    Refill:  3   Vitamin D, Ergocalciferol, (DRISDOL) 1.25 MG (50000 UNIT) CAPS capsule    Sig: Take 1 capsule (50,000 Units total) by mouth every 7 (seven) days.    Dispense:  8 capsule    Refill:  0   Cholecalciferol (VITAMIN D3) 50 MCG (2000 UT) capsule    Sig: Take 1 capsule (2,000 Units total) by mouth daily.    Dispense:  100 capsule    Refill:  3      Follow-up: Return in about 2 weeks (around 12/16/2022) for a follow-up visit.  Sonda Primes, MD

## 2022-12-03 ENCOUNTER — Telehealth: Payer: Self-pay | Admitting: *Deleted

## 2022-12-03 MED ORDER — VITAMIN D (ERGOCALCIFEROL) 1.25 MG (50000 UNIT) PO CAPS: 50000.0000 [IU] | ORAL_CAPSULE | ORAL | 0 refills | Status: AC

## 2022-12-03 MED ORDER — CYCLOBENZAPRINE HCL 5 MG PO TABS: ORAL_TABLET | ORAL | 3 refills | Status: DC

## 2022-12-03 MED ORDER — VITAMIN D3 50 MCG (2000 UT) PO CAPS: 2000.0000 [IU] | ORAL_CAPSULE | Freq: Every day | ORAL | 3 refills | Status: DC

## 2022-12-03 NOTE — Telephone Encounter (Signed)
-----   Message from [facility] sent at 12/03/2022 12:02 AM EDT ----- Regarding: Labs , Please inform Emily Sanchez about her abnormal lab work and my plan to address it.  I am not sure if she is getting MyChart messages.  You can also mail her the labs. Thank you

## 2022-12-03 NOTE — Telephone Encounter (Signed)
Called pt gave her MD response. Pt states no not able to get in. Gave her MD response on labs . Pt states she will start med today, and she would like refill on her Tramadol and Cyclobenzaprine. Sent cyclobenzaprine pls send tramadol.Marland KitchenRaechel Chute

## 2022-12-03 NOTE — Assessment & Plan Note (Signed)
Worse.  Restart prescription/OTC vitamin D

## 2022-12-03 NOTE — Addendum Note (Signed)
Addended by: Tresa Garter on: 12/03/2022 12:01 AM   Modules accepted: Orders, Level of Service

## 2022-12-04 MED ORDER — TRAMADOL HCL 50 MG PO TABS: 50.0000 mg | ORAL_TABLET | Freq: Four times a day (QID) | ORAL | 2 refills | Status: DC | PRN

## 2022-12-04 NOTE — Telephone Encounter (Signed)
Noted! Thank you

## 2022-12-05 LAB — ANTI-NUCLEAR AB-TITER (ANA TITER)
ANA TITER: 1:320 {titer} — ABNORMAL HIGH
ANA Titer 1: 1:80 {titer} — ABNORMAL HIGH

## 2022-12-05 LAB — RHEUMATOID FACTOR: Rheumatoid fact SerPl-aCnc: <10 [IU]/mL (ref <14)

## 2022-12-05 LAB — ANA: Anti Nuclear Antibody (ANA): POSITIVE — AB

## 2022-12-07 DIAGNOSIS — E079 Disorder of thyroid, unspecified: Secondary | ICD-10-CM | POA: Insufficient documentation

## 2022-12-09 ENCOUNTER — Other Ambulatory Visit: Payer: Self-pay | Admitting: Internal Medicine

## 2022-12-09 ENCOUNTER — Other Ambulatory Visit (HOSPITAL_COMMUNITY): Payer: Self-pay | Admitting: Internal Medicine

## 2022-12-09 DIAGNOSIS — N1832 Chronic kidney disease, stage 3b: Secondary | ICD-10-CM

## 2022-12-09 DIAGNOSIS — F419 Anxiety disorder, unspecified: Secondary | ICD-10-CM

## 2022-12-09 DIAGNOSIS — M1A0790 Idiopathic chronic gout, unspecified ankle and foot, without tophus (tophi): Secondary | ICD-10-CM

## 2022-12-09 DIAGNOSIS — I739 Peripheral vascular disease, unspecified: Secondary | ICD-10-CM

## 2022-12-09 DIAGNOSIS — E559 Vitamin D deficiency, unspecified: Secondary | ICD-10-CM

## 2022-12-09 DIAGNOSIS — K50118 Crohn's disease of large intestine with other complication: Secondary | ICD-10-CM

## 2022-12-09 DIAGNOSIS — L819 Disorder of pigmentation, unspecified: Secondary | ICD-10-CM

## 2022-12-15 ENCOUNTER — Ambulatory Visit: Payer: Medicare Other | Admitting: Internal Medicine

## 2022-12-16 ENCOUNTER — Ambulatory Visit (INDEPENDENT_AMBULATORY_CARE_PROVIDER_SITE_OTHER): Payer: Medicare Other | Admitting: Internal Medicine

## 2022-12-16 ENCOUNTER — Encounter: Payer: Self-pay | Admitting: Internal Medicine

## 2022-12-16 VITALS — BP 130/80 | HR 88 | Temp 98.6°F | Ht 65.0 in | Wt 190.0 lb

## 2022-12-16 DIAGNOSIS — I10 Essential (primary) hypertension: Secondary | ICD-10-CM

## 2022-12-16 DIAGNOSIS — E559 Vitamin D deficiency, unspecified: Secondary | ICD-10-CM | POA: Diagnosis not present

## 2022-12-16 DIAGNOSIS — G8929 Other chronic pain: Secondary | ICD-10-CM

## 2022-12-16 DIAGNOSIS — N1832 Chronic kidney disease, stage 3b: Secondary | ICD-10-CM | POA: Diagnosis not present

## 2022-12-16 DIAGNOSIS — M1A0790 Idiopathic chronic gout, unspecified ankle and foot, without tophus (tophi): Secondary | ICD-10-CM | POA: Diagnosis not present

## 2022-12-16 DIAGNOSIS — E669 Obesity, unspecified: Secondary | ICD-10-CM

## 2022-12-16 DIAGNOSIS — M5440 Lumbago with sciatica, unspecified side: Secondary | ICD-10-CM

## 2022-12-16 DIAGNOSIS — Z6831 Body mass index (BMI) 31.0-31.9, adult: Secondary | ICD-10-CM

## 2022-12-16 MED ORDER — VITAMIN D 25 MCG (1000 UNIT) PO TABS: 4000.0000 [IU] | ORAL_TABLET | Freq: Every day | ORAL | 11 refills | Status: AC

## 2022-12-16 MED ORDER — TRAMADOL HCL 50 MG PO TABS: 50.0000 mg | ORAL_TABLET | Freq: Four times a day (QID) | ORAL | 2 refills | Status: DC | PRN

## 2022-12-16 MED ORDER — CYCLOBENZAPRINE HCL 5 MG PO TABS: ORAL_TABLET | ORAL | 3 refills | Status: DC

## 2022-12-16 NOTE — Assessment & Plan Note (Signed)
Continue w/Flexeril 5 mg at hs and Tramadol prn  Potential benefits of a long term opioids use as well as potential risks (i.e. addiction risk, apnea etc) and complications (i.e. Somnolence, constipation and others) were explained to the patient and were aknowledged.

## 2022-12-16 NOTE — Assessment & Plan Note (Signed)
Change to Vit D tabs - hard to swallow capsules; needs tablets

## 2022-12-16 NOTE — Patient Instructions (Signed)
[employer]

## 2022-12-16 NOTE — Assessment & Plan Note (Signed)
Wt Readings from Last 3 Encounters:  12/16/22 190 lb (86.2 kg)  12/02/22 189 lb (85.7 kg)  10/29/22 191 lb (86.6 kg)

## 2022-12-16 NOTE — Assessment & Plan Note (Signed)
Off HCTZ

## 2022-12-16 NOTE — Assessment & Plan Note (Signed)
Check GFR 

## 2022-12-16 NOTE — Progress Notes (Signed)
Subjective:  Patient ID: Emily Sanchez, female    DOB: 1955-08-05  Age: 67 y.o. MRN: [id number]  CC: Follow-up (2 week f/u, Pt is now having swelling in her feet a/w the discoloration.)   HPI Emily Sanchez presents for ?gout, L foot swelling -  not better. F/u on elevated ESR, low Vit D She came from two weeks [employer] conference in [place]  Outpatient Medications Prior to Visit  Medication Sig Dispense Refill   acetaminophen (TYLENOL) 500 MG tablet Take 2 tablets (1,000 mg total) by mouth every 8 (eight) hours. (Patient taking differently: Take 1,000 mg by mouth every 6 (six) hours as needed for moderate pain.) 30 tablet 0   allopurinol (ZYLOPRIM) 100 MG tablet Take 0.5 tablets (50 mg total) by mouth daily. 45 tablet 3   amLODipine (NORVASC) 5 MG tablet Take 1 tablet (5 mg total) by mouth daily. 30 tablet 11   aspirin EC 81 MG tablet Take 1 tablet (81 mg total) by mouth daily.     colchicine 0.6 MG tablet Take two tablets prn gout attack.Then take another one in 1-2 hrs. Do not repeat for 3 days. 18 tablet 1   cyanocobalamin (,VITAMIN B-12,) 1000 MCG/ML injection Inject 1 mL (1,000 mcg total) into the muscle every 14 (fourteen) days. 20 mL 3   meloxicam (MOBIC) 7.5 MG tablet TAKE 1 TABLET BY MOUTH EVERY DAY 30 tablet 1   pantoprazole (PROTONIX) 40 MG tablet Take 1 tablet (40 mg total) by mouth 2 (two) times daily. 180 tablet 3   SYRINGE-NEEDLE, DISP, 3 ML (BD INTEGRA SYRINGE) 25G X 1" 3 ML MISC TO BE USED FOR B12 INJECTIONS 50 each 3   Vitamin D, Ergocalciferol, (DRISDOL) 1.25 MG (50000 UNIT) CAPS capsule Take 1 capsule (50,000 Units total) by mouth every 7 (seven) days. 8 capsule 0   cefdinir (OMNICEF) 300 MG capsule Take 1 capsule (300 mg total) by mouth 2 (two) times daily. 20 capsule 0   Cholecalciferol (VITAMIN D-3) 5000 UNIT/ML LIQD Place 10,000 Units under the tongue once a week. 30 mL 5   Cholecalciferol (VITAMIN D3) 50 MCG (2000 UT) capsule Take 1 capsule  (2,000 Units total) by mouth daily. 100 capsule 3   cyclobenzaprine (FLEXERIL) 5 MG tablet TAKE 1 TABLET BY MOUTH EVERYDAY AT BEDTIME 30 tablet 3   methylPREDNISolone (MEDROL DOSEPAK) 4 MG TBPK tablet As directed 21 tablet 0   traMADol (ULTRAM) 50 MG tablet Take 1 tablet (50 mg total) by mouth every 6 (six) hours as needed. 120 tablet 2   phentermine (ADIPEX-P) 37.5 MG tablet Take 1 tablet (37.5 mg total) by mouth daily before breakfast. 30 tablet 2   No facility-administered medications prior to visit.    ROS: Review of Systems  Constitutional:  Negative for activity change, appetite change, chills, fatigue and unexpected weight change.  HENT:  Negative for congestion, mouth sores and sinus pressure.   Eyes:  Negative for visual disturbance.  Respiratory:  Negative for cough and chest tightness.   Gastrointestinal:  Negative for abdominal pain and nausea.  Genitourinary:  Negative for difficulty urinating, frequency and vaginal pain.  Musculoskeletal:  Negative for back pain and gait problem.  Skin:  Negative for pallor and rash.  Neurological:  Negative for dizziness, tremors, weakness, numbness and headaches.  Psychiatric/Behavioral:  Negative for confusion and sleep disturbance.     Objective:  BP 130/80 (BP Location: Left Arm, Patient Position: Sitting, Cuff Size: Normal)   Pulse 88  Temp 98.6 F (37 C) (Oral)   Ht 5\' 5"  (1.651 m)   Wt 190 lb (86.2 kg)   SpO2 96%   BMI 31.62 kg/m   BP Readings from Last 3 Encounters:  12/16/22 130/80  12/02/22 (!) 172/96  09/01/22 130/78    Wt Readings from Last 3 Encounters:  12/16/22 190 lb (86.2 kg)  12/02/22 189 lb (85.7 kg)  10/29/22 191 lb (86.6 kg)    Physical Exam Constitutional:      General: She is not in acute distress.    Appearance: She is well-developed.  HENT:     Head: Normocephalic.     Right Ear: External ear normal.     Left Ear: External ear normal.     Nose: Nose normal.  Eyes:     General:         Right eye: No discharge.        Left eye: No discharge.     Conjunctiva/sclera: Conjunctivae normal.     Pupils: Pupils are equal, round, and reactive to light.  Neck:     Thyroid: No thyromegaly.     Vascular: No JVD.     Trachea: No tracheal deviation.  Cardiovascular:     Rate and Rhythm: Normal rate and regular rhythm.     Heart sounds: Normal heart sounds.  Pulmonary:     Effort: No respiratory distress.     Breath sounds: No stridor. No wheezing.  Abdominal:     General: Bowel sounds are normal. There is no distension.     Palpations: Abdomen is soft. There is no mass.     Tenderness: There is no abdominal tenderness. There is no guarding or rebound.  Musculoskeletal:        General: No tenderness.     Cervical back: Normal range of motion and neck supple. No rigidity.  Lymphadenopathy:     Cervical: No cervical adenopathy.  Skin:    Findings: No erythema or rash.  Neurological:     Cranial Nerves: No cranial nerve deficit.     Motor: No abnormal muscle tone.     Coordination: Coordination normal.     Deep Tendon Reflexes: Reflexes normal.  Psychiatric:        Behavior: Behavior normal.        Thought Content: Thought content normal.        Judgment: Judgment normal.    Swelling of the L foot is a little less   Lab Results  Component Value Date   WBC 4.2 12/02/2022   HGB 12.7 12/02/2022   HCT 39.7 12/02/2022   PLT 339.0 12/02/2022   GLUCOSE 85 01/13/2022   CHOL 172 05/27/2010   TRIG 100.0 05/27/2010   HDL 46.30 05/27/2010   LDLCALC 106 (H) 05/27/2010   ALT 12 01/13/2022   AST 19 01/13/2022   NA 139 01/13/2022   K 4.0 01/13/2022   CL 106 01/13/2022   CREATININE 1.23 (H) 01/13/2022   BUN 25 (H) 01/13/2022   CO2 24 01/13/2022   TSH 0.86 12/02/2022   INR 1.0 01/05/2018   HGBA1C 5.9 12/17/2009   MICROALBUR 0.6 02/10/2013    MM 3D SCREEN BREAST BILATERAL  Result Date: 01/28/2022 CLINICAL DATA:  Screening. EXAM: DIGITAL SCREENING BILATERAL MAMMOGRAM  WITH TOMOSYNTHESIS AND CAD TECHNIQUE: Bilateral screening digital craniocaudal and mediolateral oblique mammograms were obtained. Bilateral screening digital breast tomosynthesis was performed. The images were evaluated with computer-aided detection. COMPARISON:  Previous exam(s). ACR Breast Density Category a: The breast  tissue is almost entirely fatty. FINDINGS: There are no findings suspicious for malignancy. IMPRESSION: No mammographic evidence of malignancy. A result letter of this screening mammogram will be mailed directly to the patient. RECOMMENDATION: Screening mammogram in one year. (Code:SM-B-01Y) BI-RADS CATEGORY  1: Negative. Electronically Signed   By: Elberta Fortis M.D.   On: 01/28/2022 16:11    Assessment & Plan:   Problem List Items Addressed This Visit     CRI (chronic renal insufficiency)    Check GFR      Relevant Orders   CBC with Differential/Platelet   Comprehensive metabolic panel   Uric acid   Sedimentation rate   Essential hypertension    Off HCTZ      Relevant Orders   CBC with Differential/Platelet   Comprehensive metabolic panel   Uric acid   Sedimentation rate   Gout    Gout. Elevated uric acid level Continue allopurinol, stop or use as needed colchicine      LOW BACK PAIN    Continue w/Flexeril 5 mg at hs and Tramadol prn  Potential benefits of a long term opioids use as well as potential risks (i.e. addiction risk, apnea etc) and complications (i.e. Somnolence, constipation and others) were explained to the patient and were aknowledged.       Relevant Medications   traMADol (ULTRAM) 50 MG tablet   cyclobenzaprine (FLEXERIL) 5 MG tablet   Obesity (BMI 30-39.9)    Wt Readings from Last 3 Encounters:  12/16/22 190 lb (86.2 kg)  12/02/22 189 lb (85.7 kg)  10/29/22 191 lb (86.6 kg)         Vitamin D deficiency - Primary    Change to Vit D tabs - hard to swallow capsules; needs tablets      Relevant Orders   CBC with  Differential/Platelet   Comprehensive metabolic panel   Uric acid   Sedimentation rate      Meds ordered this encounter  Medications   cholecalciferol (VITAMIN D3) 25 MCG (1000 UNIT) tablet    Sig: Take 4 tablets (4,000 Units total) by mouth daily.    Dispense:  120 tablet    Refill:  11   traMADol (ULTRAM) 50 MG tablet    Sig: Take 1 tablet (50 mg total) by mouth every 6 (six) hours as needed.    Dispense:  120 tablet    Refill:  2   cyclobenzaprine (FLEXERIL) 5 MG tablet    Sig: TAKE 1 TABLET BY MOUTH EVERYDAY AT BEDTIME    Dispense:  30 tablet    Refill:  3      Follow-up: Return in about 2 months (around 02/15/2023) for a follow-up visit.  Sonda Primes, MD

## 2022-12-16 NOTE — Assessment & Plan Note (Signed)
Gout. Elevated uric acid level Continue allopurinol, stop or use as needed colchicine

## 2022-12-18 ENCOUNTER — Ambulatory Visit (HOSPITAL_COMMUNITY): Admission: RE | Admit: 2022-12-18 | Payer: Medicare Other | Source: Ambulatory Visit

## 2022-12-18 ENCOUNTER — Ambulatory Visit (HOSPITAL_COMMUNITY)
Admission: RE | Admit: 2022-12-18 | Discharge: 2022-12-18 | Disposition: A | Discharge status: Home / Self Care | Payer: Medicare Other | Source: Ambulatory Visit | Attending: Cardiology | Admitting: Cardiology

## 2022-12-18 DIAGNOSIS — L819 Disorder of pigmentation, unspecified: Secondary | ICD-10-CM | POA: Diagnosis not present

## 2022-12-18 DIAGNOSIS — I739 Peripheral vascular disease, unspecified: Secondary | ICD-10-CM | POA: Insufficient documentation

## 2022-12-22 LAB — VAS US ABI WITH/WO TBI
Left ABI: 1.31
Right ABI: 1.27

## 2022-12-24 ENCOUNTER — Other Ambulatory Visit: Payer: Self-pay | Admitting: Internal Medicine

## 2022-12-24 DIAGNOSIS — Z1231 Encounter for screening mammogram for malignant neoplasm of breast: Secondary | ICD-10-CM

## 2023-01-18 NOTE — Progress Notes (Unsigned)
[facility] [place]  ASSESSMENT / PLAN: 67 y.o. female with no evidence of significant peripheral arterial disease on clinical exam or non-invasive testing. No vascular explanation for her symptoms in her feet. She can follow up with me on an as needed basis.   CHIEF COMPLAINT: feet pain  HISTORY OF PRESENT ILLNESS: Emily Sanchez is a 67 y.o. female referred to clinic for evaluation of bilateral feet pain.  The patient reports she will have episodic pain in her feet that she describes as electric and "odd."  She has abnormal sensation in her feet.  The symptoms will occasionally awaken her from sleep.  The patient is not limited by her ability to walk.  She does not have any symptoms consistent with rest pain.  She has no ulcers about her feet.  She does note some discoloration of her second and third toes bilaterally to block.  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    GERD (gastroesophageal reflux disease)    HTN (hypertension)    lst blood pressure medication was 3 months ago    Ileostomy in place [facility])    Low back pain    FMS   Osteoarthritis    Dr Charlann Boxer; both knees   Perianal pain    Chronic, Post-Op   Transfusion history    none recent   Ulcerative colitis (HCC)    Dr Jarold Motto    Past Surgical History:  Procedure Laterality Date   BREATH Katherina Mires PYLORI N/A 12/05/2013   Procedure: BREATH TEK Richardo Priest;  Surgeon: Valarie Merino, MD;  Location: [facility];  Service: General;  Laterality: N/A;   COLECTOMY  04/21/1999   ulcerative colitis   ILEOSTOMY  2011   INJECTION KNEE Left 01/16/2020   Procedure: KNEE INJECTION;  Surgeon: Durene Romans, MD;  Location: [facility];  Service: Orthopedics;  Laterality: Left;   LAPAROSCOPIC GASTRIC SLEEVE  RESECTION WITH HIATAL HERNIA REPAIR N/A 04/02/2015   Procedure: LAPAROSCOPIC LYSIS OF ADHESIONS GASTRIC SLEEVE RESECTION WITH OPEN VENTRAL HERNIA REPAIR;  Surgeon: Luretha Murphy, MD;  Location: [facility];  Service: General;  Laterality: N/A;   LAPAROSCOPIC LYSIS OF ADHESIONS  04/02/2015   Procedure: LAPAROSCOPIC LYSIS OF ADHESIONS;  Surgeon: Luretha Murphy, MD;  Location: [facility];  Service: General;;   LAPAROTOMY N/A 06/11/2017   Procedure: EXPLORATORY LAPAROTOMY, LYSIS OF ADHESIONS, REPAIR OF PEISTOMAL HERNIA;  Surgeon: Romie Levee, MD;  Location: [facility];  Service: General;  Laterality: N/A;   TONSILLECTOMY     as a child   TOTAL KNEE ARTHROPLASTY Right 01/16/2020   Procedure: RIGHT TOTAL KNEE ARTHROPLASTY;  Surgeon: Durene Romans, MD;  Location: [facility];  Service: Orthopedics;  Laterality: Right;  70 mins   TOTAL KNEE ARTHROPLASTY Left 07/15/2021   Procedure: TOTAL KNEE ARTHROPLASTY;  Surgeon: Durene Romans, MD;  Location: [facility];  Service: Orthopedics;  Laterality: Left;   TUBAL LIGATION  1983   UPPER GI ENDOSCOPY  04/02/2015   Procedure: UPPER GI ENDOSCOPY;  Surgeon: Luretha Murphy, MD;  Location: [facility];  Service: General;;   VENTRAL HERNIA REPAIR  04/02/2015   Procedure: HERNIA REPAIR VENTRAL ADULT;  Surgeon: Luretha Murphy, MD;  Location: [facility];  Service: General;;  Family History  Problem Relation Age of Onset   Heart disease Father 44       CHF   Depression Sister    Hypertension Other     Social History   Socioeconomic History   Marital status: Widowed    Spouse name: Not on file   Number of children: 1   Years of education: 14   Highest education level: Not on file  Occupational History   Not on file  Tobacco Use   Smoking status: Former    Current packs/day: 0.00    Types: Cigarettes    Start date: 78    Quit date: 27    Years since quitting: 49.7   Smokeless tobacco: Never   Tobacco comments:    very light social only-quit many years ago  Vaping Use    Vaping status: Never Used  Substance and Sexual Activity   Alcohol use: No   Drug use: No   Sexual activity: Yes  Other Topics Concern   Not on file  Social History Narrative   Married. 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)   Social Determinants of Health   Financial Resource Strain: Low Risk  (10/29/2022)   Overall Financial Resource Strain (CARDIA)    Difficulty of Paying Living Expenses: Not very hard  Food Insecurity: No Food Insecurity (10/29/2022)   Hunger Vital Sign    Worried About Running Out of Food in the Last Year: Never true    Ran Out of Food in the Last Year: Never true  Transportation Needs: No Transportation Needs (10/29/2022)   PRAPARE - Administrator, Civil Service (Medical): No    Lack of Transportation (Non-Medical): No  Physical Activity: Sufficiently Active (10/29/2022)   Exercise Vital Sign    Days of Exercise per Week: 3 days    Minutes of Exercise per Session: 60 min  Stress: Stress Concern Present (10/29/2022)   [employer] of Occupational Health - Occupational Stress Questionnaire    Feeling of Stress : To some extent  Social Connections: Moderately Integrated (10/29/2022)   Social Connection and Isolation Panel [NHANES]    Frequency of Communication with Friends and Family: More than three times a week    Frequency of Social Gatherings with Friends and Family: More than three times a week    Attends Religious Services: More than 4 times per year    Active Member of [employer] or Organizations: Yes    Attends Banker Meetings: 1 to 4 times per year    Marital Status: Widowed  Intimate Partner Violence: Not At Risk (10/29/2022)   Humiliation, Afraid, Rape, and Kick questionnaire    Fear of Current or Ex-Partner: No    Emotionally Abused: No    Physically Abused: No    Sexually Abused: No    Allergies  Allergen Reactions   Erythromycin Base Nausea And Vomiting   Amphetamine-Dextroamphet Er Nausea And Vomiting   Cymbalta  [Duloxetine Hcl] Other (See Comments)    "Made me sleepy."   Erythromycin Ethylsuccinate Nausea And Vomiting   Morphine Nausea And Vomiting    Current Outpatient Medications  Medication Sig Dispense Refill   acetaminophen (TYLENOL) 500 MG tablet Take 2 tablets (1,000 mg total) by mouth every 8 (eight) hours. (Patient taking differently: Take 1,000 mg by mouth every 6 (six) hours as needed for moderate pain.) 30 tablet 0   allopurinol (ZYLOPRIM) 100 MG tablet Take 0.5 tablets (50 mg total) by mouth daily. 45 tablet 3  amLODipine (NORVASC) 5 MG tablet Take 1 tablet (5 mg total) by mouth daily. 30 tablet 11   aspirin EC 81 MG tablet Take 1 tablet (81 mg total) by mouth daily.     cholecalciferol (VITAMIN D3) 25 MCG (1000 UNIT) tablet Take 4 tablets (4,000 Units total) by mouth daily. 120 tablet 11   colchicine 0.6 MG tablet Take two tablets prn gout attack.Then take another one in 1-2 hrs. Do not repeat for 3 days. 18 tablet 1   cyanocobalamin (,VITAMIN B-12,) 1000 MCG/ML injection Inject 1 mL (1,000 mcg total) into the muscle every 14 (fourteen) days. 20 mL 3   cyclobenzaprine (FLEXERIL) 5 MG tablet TAKE 1 TABLET BY MOUTH EVERYDAY AT BEDTIME 30 tablet 3   meloxicam (MOBIC) 7.5 MG tablet TAKE 1 TABLET BY MOUTH EVERY DAY 30 tablet 1   pantoprazole (PROTONIX) 40 MG tablet Take 1 tablet (40 mg total) by mouth 2 (two) times daily. 180 tablet 3   SYRINGE-NEEDLE, DISP, 3 ML (BD INTEGRA SYRINGE) 25G X 1" 3 ML MISC TO BE USED FOR B12 INJECTIONS 50 each 3   traMADol (ULTRAM) 50 MG tablet Take 1 tablet (50 mg total) by mouth every 6 (six) hours as needed. 120 tablet 2   Vitamin D, Ergocalciferol, (DRISDOL) 1.25 MG (50000 UNIT) CAPS capsule Take 1 capsule (50,000 Units total) by mouth every 7 (seven) days. 8 capsule 0   No current facility-administered medications for this visit.    PHYSICAL EXAM There were no vitals filed for this visit.  Elderly woman in no distress Regular rate and  rhythm Unlabored breathing 2+ DP and PT pulses bilaterally Mild discoloration of the second and third toes bilaterally   PERTINENT LABORATORY AND RADIOLOGIC DATA  Most recent CBC    Latest Ref Rng & Units 12/02/2022   12:20 PM 01/13/2022   12:01 PM 07/16/2021    3:22 AM  CBC  WBC 4.0 - 10.5 K/uL 4.2  5.3  8.0   Hemoglobin 12.0 - 15.0 g/dL 40.9  81.1  91.4   Hematocrit 36.0 - 46.0 % 39.7  33.9  34.0   Platelets 150.0 - 400.0 K/uL 339.0  246.0  240      Most recent CMP    Latest Ref Rng & Units 01/13/2022   12:01 PM 07/16/2021    3:22 AM 07/08/2021    1:25 PM  CMP  Glucose 70 - 99 mg/dL 85  782  92   BUN 6 - 23 mg/dL 25  18  24    Creatinine 0.40 - 1.20 mg/dL 9.56  2.13  0.86   Sodium 135 - 145 mEq/L 139  132  137   Potassium 3.5 - 5.1 mEq/L 4.0  3.9  3.8   Chloride 96 - 112 mEq/L 106  102  105   CO2 19 - 32 mEq/L 24  22  24    Calcium 8.4 - 10.5 mg/dL 9.4  8.6  8.8   Total Protein 6.0 - 8.3 g/dL 7.9   7.5   Total Bilirubin 0.2 - 1.2 mg/dL 0.5   0.4   Alkaline Phos 39 - 117 U/L 86   77   AST 0 - 37 U/L 19   17   ALT 0 - 35 U/L 12   11     Renal function CrCl cannot be calculated ([name] most recent lab result is older than the maximum 21 days allowed.).  Hgb A1c MFr Bld (%)  Date Value  12/17/2009 5.9  LDL Cholesterol  Date Value Ref Range Status  05/27/2010 106 (H) 0 - 99 mg/dL Final     +-------+-----------+-----------+------------+------------+  ABI/TBIToday's ABIToday's TBIPrevious ABIPrevious TBI  +-------+-----------+-----------+------------+------------+  Right 1.27       0.92                                 +-------+-----------+-----------+------------+------------+  Left  1.31       0.85                                 +-------+-----------+-----------+------------+------------+   Lower extremity arterial duplex bilateral:  No evidence of significant infrainguinal stenosis by duplex.   See ABI study.   Rande Brunt. Lenell Antu, MD  F[facility] Phone Number: [phone] 01/18/2023 7:52 PM   Total time spent on preparing this encounter including chart review, data review, collecting history, examining the patient, coordinating care for this new patient, 45 minutes.  Portions of this report may have been transcribed using voice recognition software.  Every effort has been made to ensure accuracy; however, inadvertent computerized transcription errors may still be present.

## 2023-01-19 ENCOUNTER — Ambulatory Visit: Payer: Medicare Other | Admitting: Vascular Surgery

## 2023-01-19 ENCOUNTER — Encounter: Payer: Self-pay | Admitting: Vascular Surgery

## 2023-01-19 VITALS — BP 193/114 | HR 95 | Temp 98.1°F | Resp 20 | Ht 65.0 in | Wt 190.0 lb

## 2023-01-19 DIAGNOSIS — M79671 Pain in right foot: Secondary | ICD-10-CM

## 2023-01-19 DIAGNOSIS — M79672 Pain in left foot: Secondary | ICD-10-CM | POA: Diagnosis not present

## 2023-02-01 ENCOUNTER — Ambulatory Visit
Admission: RE | Admit: 2023-02-01 | Discharge: 2023-02-01 | Disposition: A | Discharge status: Home / Self Care | Payer: Medicare Other | Source: Ambulatory Visit | Attending: Internal Medicine | Admitting: Internal Medicine

## 2023-02-01 DIAGNOSIS — Z1231 Encounter for screening mammogram for malignant neoplasm of breast: Secondary | ICD-10-CM

## 2023-02-16 ENCOUNTER — Ambulatory Visit: Payer: Medicare Other | Admitting: Internal Medicine

## 2023-02-16 ENCOUNTER — Encounter: Payer: Self-pay | Admitting: Internal Medicine

## 2023-02-16 VITALS — BP 110/80 | HR 77 | Temp 98.2°F | Ht 65.0 in | Wt 188.0 lb

## 2023-02-16 DIAGNOSIS — E538 Deficiency of other specified B group vitamins: Secondary | ICD-10-CM | POA: Diagnosis not present

## 2023-02-16 DIAGNOSIS — F419 Anxiety disorder, unspecified: Secondary | ICD-10-CM

## 2023-02-16 DIAGNOSIS — Z23 Encounter for immunization: Secondary | ICD-10-CM

## 2023-02-16 DIAGNOSIS — M1A0790 Idiopathic chronic gout, unspecified ankle and foot, without tophus (tophi): Secondary | ICD-10-CM

## 2023-02-16 DIAGNOSIS — M150 Primary generalized (osteo)arthritis: Secondary | ICD-10-CM | POA: Diagnosis not present

## 2023-02-16 MED ORDER — METHYLPREDNISOLONE 4 MG PO TBPK: ORAL_TABLET | ORAL | 1 refills | Status: DC

## 2023-02-16 MED ORDER — CYCLOBENZAPRINE HCL 5 MG PO TABS: ORAL_TABLET | ORAL | 3 refills | Status: DC

## 2023-02-16 MED ORDER — TRAMADOL HCL 50 MG PO TABS: 50.0000 mg | ORAL_TABLET | Freq: Four times a day (QID) | ORAL | 2 refills | Status: DC | PRN

## 2023-02-16 NOTE — Assessment & Plan Note (Signed)
Doing well at present 

## 2023-02-16 NOTE — Assessment & Plan Note (Signed)
Tramadol prn ° Potential benefits of a long term opioids use as well as potential risks (i.e. addiction risk, apnea etc) and complications (i.e. Somnolence, constipation and others) were explained to the patient and were aknowledged. ° ° °

## 2023-02-16 NOTE — Assessment & Plan Note (Signed)
On B12 

## 2023-02-16 NOTE — Assessment & Plan Note (Signed)
Medrol pac prn Allopurinol 50 mg/d Diet

## 2023-02-16 NOTE — Progress Notes (Signed)
Subjective:  Patient ID: Emily Sanchez, female    DOB: [birth year]  Age: 67 y.o. MRN: [id number]  CC: Medical Management of Chronic Issues (2 mnth f/u)   HPI Emily Sanchez presents for HTN, B12 def, OA  Outpatient Medications Prior to Visit  Medication Sig Dispense Refill   acetaminophen (TYLENOL) 500 MG tablet Take 2 tablets (1,000 mg total) by mouth every 8 (eight) hours. (Patient taking differently: Take 1,000 mg by mouth every 6 (six) hours as needed for moderate pain (pain score 4-6).) 30 tablet 0   allopurinol (ZYLOPRIM) 100 MG tablet Take 0.5 tablets (50 mg total) by mouth daily. 45 tablet 3   amLODipine (NORVASC) 5 MG tablet Take 1 tablet (5 mg total) by mouth daily. 30 tablet 11   aspirin EC 81 MG tablet Take 1 tablet (81 mg total) by mouth daily.     cholecalciferol (VITAMIN D3) 25 MCG (1000 UNIT) tablet Take 4 tablets (4,000 Units total) by mouth daily. 120 tablet 11   colchicine 0.6 MG tablet Take two tablets prn gout attack.Then take another one in 1-2 hrs. Do not repeat for 3 days. 18 tablet 1   cyanocobalamin (,VITAMIN B-12,) 1000 MCG/ML injection Inject 1 mL (1,000 mcg total) into the muscle every 14 (fourteen) days. 20 mL 3   meloxicam (MOBIC) 7.5 MG tablet TAKE 1 TABLET BY MOUTH EVERY DAY 30 tablet 1   pantoprazole (PROTONIX) 40 MG tablet Take 1 tablet (40 mg total) by mouth 2 (two) times daily. 180 tablet 3   SYRINGE-NEEDLE, DISP, 3 ML (BD INTEGRA SYRINGE) 25G X 1" 3 ML MISC TO BE USED FOR B12 INJECTIONS 50 each 3   Vitamin D, Ergocalciferol, (DRISDOL) 1.25 MG (50000 UNIT) CAPS capsule Take 1 capsule (50,000 Units total) by mouth every 7 (seven) days. 8 capsule 0   cyclobenzaprine (FLEXERIL) 5 MG tablet TAKE 1 TABLET BY MOUTH EVERYDAY AT BEDTIME 30 tablet 3   traMADol (ULTRAM) 50 MG tablet Take 1 tablet (50 mg total) by mouth every 6 (six) hours as needed. 120 tablet 2   No facility-administered medications prior to visit.    ROS: Review of Systems   Constitutional:  Negative for activity change, appetite change, chills, fatigue and unexpected weight change.  HENT:  Negative for congestion, mouth sores and sinus pressure.   Eyes:  Negative for visual disturbance.  Respiratory:  Negative for cough and chest tightness.   Gastrointestinal:  Negative for abdominal pain and nausea.  Genitourinary:  Negative for difficulty urinating, frequency and vaginal pain.  Musculoskeletal:  Positive for arthralgias and back pain. Negative for gait problem.  Skin:  Negative for pallor and rash.  Neurological:  Negative for dizziness, tremors, weakness, numbness and headaches.  Psychiatric/Behavioral:  Negative for confusion, sleep disturbance and suicidal ideas. The patient is nervous/anxious.     Objective:  BP 110/80 (BP Location: Left Arm, Patient Position: Sitting, Cuff Size: Normal)   Pulse 77   Temp 98.2 F (36.8 C) (Oral)   Ht 5\' 5"  (1.651 m)   Wt 188 lb (85.3 kg)   SpO2 98%   BMI 31.28 kg/m   BP Readings from Last 3 Encounters:  02/16/23 110/80  01/19/23 (!) 193/114  12/16/22 130/80    Wt Readings from Last 3 Encounters:  02/16/23 188 lb (85.3 kg)  01/19/23 190 lb (86.2 kg)  12/16/22 190 lb (86.2 kg)    Physical Exam Constitutional:      General: She is not in acute distress.  Appearance: She is well-developed. She is obese.  HENT:     Head: Normocephalic.     Right Ear: External ear normal.     Left Ear: External ear normal.     Nose: Nose normal.  Eyes:     General:        Right eye: No discharge.        Left eye: No discharge.     Conjunctiva/sclera: Conjunctivae normal.     Pupils: Pupils are equal, round, and reactive to light.  Neck:     Thyroid: No thyromegaly.     Vascular: No JVD.     Trachea: No tracheal deviation.  Cardiovascular:     Rate and Rhythm: Normal rate and regular rhythm.     Heart sounds: Normal heart sounds.  Pulmonary:     Effort: No respiratory distress.     Breath sounds: No stridor.  No wheezing.  Abdominal:     General: Bowel sounds are normal. There is no distension.     Palpations: Abdomen is soft. There is no mass.     Tenderness: There is no abdominal tenderness. There is no guarding or rebound.  Musculoskeletal:        General: Tenderness present.     Cervical back: Normal range of motion and neck supple. No rigidity.     Right lower leg: No edema.     Left lower leg: No edema.  Lymphadenopathy:     Cervical: No cervical adenopathy.  Skin:    Findings: No erythema or rash.  Neurological:     Mental Status: She is oriented to person, place, and time.     Cranial Nerves: No cranial nerve deficit.     Motor: No abnormal muscle tone.     Coordination: Coordination normal.     Deep Tendon Reflexes: Reflexes normal.  Psychiatric:        Behavior: Behavior normal.        Thought Content: Thought content normal.        Judgment: Judgment normal.     Lab Results  Component Value Date   WBC 4.2 12/02/2022   HGB 12.7 12/02/2022   HCT 39.7 12/02/2022   PLT 339.0 12/02/2022   GLUCOSE 85 01/13/2022   CHOL 172 05/27/2010   TRIG 100.0 05/27/2010   HDL 46.30 05/27/2010   LDLCALC 106 (H) 05/27/2010   ALT 12 01/13/2022   AST 19 01/13/2022   NA 139 01/13/2022   K 4.0 01/13/2022   CL 106 01/13/2022   CREATININE 1.23 (H) 01/13/2022   BUN 25 (H) 01/13/2022   CO2 24 01/13/2022   TSH 0.86 12/02/2022   INR 1.0 01/05/2018   HGBA1C 5.9 12/17/2009   MICROALBUR 0.6 02/10/2013    MM 3D SCREENING MAMMOGRAM BILATERAL BREAST  Result Date: 02/03/2023 CLINICAL DATA:  Screening. EXAM: DIGITAL SCREENING BILATERAL MAMMOGRAM WITH TOMOSYNTHESIS AND CAD TECHNIQUE: Bilateral screening digital craniocaudal and mediolateral oblique mammograms were obtained. Bilateral screening digital breast tomosynthesis was performed. The images were evaluated with computer-aided detection. COMPARISON:  Previous exam(s). ACR Breast Density Category b: There are scattered areas of fibroglandular  density. FINDINGS: There are no findings suspicious for malignancy. IMPRESSION: No mammographic evidence of malignancy. A result letter of this screening mammogram will be mailed directly to the patient. RECOMMENDATION: Screening mammogram in one year. (Code:[id number]-B-01Y) BI-RADS CATEGORY  1: Negative. Electronically Signed   By: Edwin Cap M.D.   On: 02/03/2023 11:59    Assessment & Plan:   Problem List  Items Addressed This Visit     B12 deficiency    On B12      Anxiety disorder - Primary    Doing well at present      Osteoarthritis    Tramadol prn  Potential benefits of a long term opioids use as well as potential risks (i.e. addiction risk, apnea etc) and complications (i.e. Somnolence, constipation and others) were explained to the patient and were aknowledged.       Relevant Medications   methylPREDNISolone (MEDROL DOSEPAK) 4 MG TBPK tablet   cyclobenzaprine (FLEXERIL) 5 MG tablet   traMADol (ULTRAM) 50 MG tablet   Gout    Medrol pac prn Allopurinol 50 mg/d Diet      Other Visit Diagnoses     Need for influenza vaccination       Relevant Orders   Flu Vaccine Trivalent High Dose (Fluad) (Completed)         Meds ordered this encounter  Medications   methylPREDNISolone (MEDROL DOSEPAK) 4 MG TBPK tablet    Sig: As directed for foot pain flare up/gout    Dispense:  21 tablet    Refill:  1   cyclobenzaprine (FLEXERIL) 5 MG tablet    Sig: TAKE 1 TABLET BY MOUTH EVERYDAY AT BEDTIME    Dispense:  30 tablet    Refill:  3   traMADol (ULTRAM) 50 MG tablet    Sig: Take 1 tablet (50 mg total) by mouth every 6 (six) hours as needed.    Dispense:  120 tablet    Refill:  2      Follow-up: Return in about 3 months (around 05/19/2023) for a follow-up visit.  Sonda Primes, MD

## 2023-02-25 DIAGNOSIS — Z432 Encounter for attention to ileostomy: Secondary | ICD-10-CM | POA: Diagnosis not present

## 2023-04-28 ENCOUNTER — Ambulatory Visit (INDEPENDENT_AMBULATORY_CARE_PROVIDER_SITE_OTHER)
Admission: RE | Admit: 2023-04-28 | Discharge: 2023-04-28 | Disposition: A | Discharge status: Home / Self Care | Payer: Medicare Other | Source: Ambulatory Visit | Attending: Internal Medicine | Admitting: Internal Medicine

## 2023-04-28 DIAGNOSIS — Z0000 Encounter for general adult medical examination without abnormal findings: Secondary | ICD-10-CM

## 2023-04-28 DIAGNOSIS — Z13820 Encounter for screening for osteoporosis: Secondary | ICD-10-CM | POA: Diagnosis not present

## 2023-04-28 DIAGNOSIS — M85851 Other specified disorders of bone density and structure, right thigh: Secondary | ICD-10-CM

## 2023-05-19 ENCOUNTER — Ambulatory Visit: Payer: Medicare Other | Admitting: Internal Medicine

## 2023-05-19 ENCOUNTER — Ambulatory Visit (INDEPENDENT_AMBULATORY_CARE_PROVIDER_SITE_OTHER): Payer: Medicare Other

## 2023-05-19 ENCOUNTER — Other Ambulatory Visit: Payer: Self-pay | Admitting: Internal Medicine

## 2023-05-19 ENCOUNTER — Encounter: Payer: Self-pay | Admitting: Internal Medicine

## 2023-05-19 VITALS — BP 118/78 | HR 58 | Temp 98.7°F | Ht 65.0 in | Wt 188.0 lb

## 2023-05-19 DIAGNOSIS — M25551 Pain in right hip: Secondary | ICD-10-CM | POA: Insufficient documentation

## 2023-05-19 DIAGNOSIS — I10 Essential (primary) hypertension: Secondary | ICD-10-CM

## 2023-05-19 DIAGNOSIS — M797 Fibromyalgia: Secondary | ICD-10-CM

## 2023-05-19 DIAGNOSIS — I878 Other specified disorders of veins: Secondary | ICD-10-CM | POA: Diagnosis not present

## 2023-05-19 DIAGNOSIS — M47816 Spondylosis without myelopathy or radiculopathy, lumbar region: Secondary | ICD-10-CM | POA: Diagnosis not present

## 2023-05-19 DIAGNOSIS — F419 Anxiety disorder, unspecified: Secondary | ICD-10-CM

## 2023-05-19 MED ORDER — PHENTERMINE HCL 37.5 MG PO TABS
37.5000 mg | ORAL_TABLET | Freq: Every day | ORAL | 2 refills | Status: DC
Start: 2023-05-19 — End: 2023-10-13

## 2023-05-19 MED ORDER — TRAMADOL HCL 50 MG PO TABS: 50.0000 mg | ORAL_TABLET | Freq: Four times a day (QID) | ORAL | 2 refills | Status: AC | PRN

## 2023-05-19 MED ORDER — METHYLPREDNISOLONE 4 MG PO TBPK: ORAL_TABLET | ORAL | 1 refills | Status: DC

## 2023-05-19 MED ORDER — CEFDINIR 300 MG PO CAPS: 300.0000 mg | ORAL_CAPSULE | Freq: Two times a day (BID) | ORAL | 0 refills | Status: DC

## 2023-05-19 NOTE — Assessment & Plan Note (Signed)
Tramadol prn  Potential benefits of a long term opioids use as well as potential risks (i.e. addiction risk, apnea etc) and complications (i.e. Somnolence, constipation and others) were explained to the patient and were aknowledged.

## 2023-05-19 NOTE — Assessment & Plan Note (Signed)
Phentermine   Potential benefits of a long term phentermine  use as well as potential risks  and complications were explained to the patient and were aknowledged.

## 2023-05-19 NOTE — Progress Notes (Signed)
Subjective:  Patient ID: Emily Sanchez, female    DOB: 1955-10-10  Age: 68 y.o. MRN: [id number]  CC: Medical Management of Chronic Issues (3 mnth f/u, pt states she has been seeing an increase in sinus drainage a/w eye drainage.)   HPI Emily Sanchez presents for sinusitis, R ey drainage, abn DEXA, FMS  Outpatient Medications Prior to Visit  Medication Sig Dispense Refill   acetaminophen (TYLENOL) 500 MG tablet Take 2 tablets (1,000 mg total) by mouth every 8 (eight) hours. (Patient taking differently: Take 1,000 mg by mouth every 6 (six) hours as needed for moderate pain (pain score 4-6).) 30 tablet 0   allopurinol (ZYLOPRIM) 100 MG tablet Take 0.5 tablets (50 mg total) by mouth daily. 45 tablet 3   amLODipine (NORVASC) 5 MG tablet Take 1 tablet (5 mg total) by mouth daily. 30 tablet 11   aspirin EC 81 MG tablet Take 1 tablet (81 mg total) by mouth daily.     cholecalciferol (VITAMIN D3) 25 MCG (1000 UNIT) tablet Take 4 tablets (4,000 Units total) by mouth daily. 120 tablet 11   colchicine 0.6 MG tablet Take two tablets prn gout attack.Then take another one in 1-2 hrs. Do not repeat for 3 days. 18 tablet 1   cyanocobalamin (,VITAMIN B-12,) 1000 MCG/ML injection Inject 1 mL (1,000 mcg total) into the muscle every 14 (fourteen) days. 20 mL 3   cyclobenzaprine (FLEXERIL) 5 MG tablet TAKE 1 TABLET BY MOUTH EVERYDAY AT BEDTIME 30 tablet 3   meloxicam (MOBIC) 7.5 MG tablet TAKE 1 TABLET BY MOUTH EVERY DAY 30 tablet 1   pantoprazole (PROTONIX) 40 MG tablet Take 1 tablet (40 mg total) by mouth 2 (two) times daily. 180 tablet 3   SYRINGE-NEEDLE, DISP, 3 ML (BD INTEGRA SYRINGE) 25G X 1" 3 ML MISC TO BE USED FOR B12 INJECTIONS 50 each 3   Vitamin D, Ergocalciferol, (DRISDOL) 1.25 MG (50000 UNIT) CAPS capsule Take 1 capsule (50,000 Units total) by mouth every 7 (seven) days. 8 capsule 0   methylPREDNISolone (MEDROL DOSEPAK) 4 MG TBPK tablet As directed for foot pain flare up/gout 21  tablet 1   traMADol (ULTRAM) 50 MG tablet Take 1 tablet (50 mg total) by mouth every 6 (six) hours as needed. 120 tablet 2   No facility-administered medications prior to visit.    ROS: Review of Systems  Constitutional:  Positive for fatigue. Negative for activity change, appetite change, chills and unexpected weight change.  HENT:  Positive for rhinorrhea, sinus pressure and sinus pain. Negative for congestion and mouth sores.   Eyes:  Positive for pain. Negative for visual disturbance.  Respiratory:  Negative for cough and chest tightness.   Gastrointestinal:  Negative for abdominal pain and nausea.  Genitourinary:  Negative for difficulty urinating, frequency and vaginal pain.  Musculoskeletal:  Positive for arthralgias, back pain and gait problem.  Skin:  Negative for pallor and rash.  Neurological:  Negative for dizziness, tremors, weakness, numbness and headaches.  Psychiatric/Behavioral:  Negative for confusion, sleep disturbance and suicidal ideas.     Objective:  BP 118/78 (BP Location: Right Arm, Patient Position: Sitting, Cuff Size: Normal)   Pulse (!) 58   Temp 98.7 F (37.1 C) (Oral)   Ht 5\' 5"  (1.651 m)   Wt 188 lb (85.3 kg)   SpO2 98%   BMI 31.28 kg/m   BP Readings from Last 3 Encounters:  05/19/23 118/78  02/16/23 110/80  01/19/23 (!) 193/114    [employer]  Readings from Last 3 Encounters:  05/19/23 188 lb (85.3 kg)  02/16/23 188 lb (85.3 kg)  01/19/23 190 lb (86.2 kg)    Physical Exam Constitutional:      General: She is not in acute distress.    Appearance: She is well-developed.  HENT:     Head: Normocephalic.     Right Ear: External ear normal.     Left Ear: External ear normal.     Nose: Nose normal.     Mouth/Throat:     Pharynx: Posterior oropharyngeal erythema present. No oropharyngeal exudate.  Eyes:     General:        Right eye: No discharge.        Left eye: No discharge.     Conjunctiva/sclera: Conjunctivae normal.     Pupils: Pupils are  equal, round, and reactive to light.  Neck:     Thyroid: No thyromegaly.     Vascular: No JVD.     Trachea: No tracheal deviation.  Cardiovascular:     Rate and Rhythm: Normal rate and regular rhythm.     Heart sounds: Normal heart sounds.  Pulmonary:     Effort: No respiratory distress.     Breath sounds: No stridor. No wheezing.  Abdominal:     General: Bowel sounds are normal. There is no distension.     Palpations: Abdomen is soft. There is no mass.     Tenderness: There is no abdominal tenderness. There is no guarding or rebound.  Musculoskeletal:        General: No tenderness.     Cervical back: Normal range of motion and neck supple. No rigidity.  Lymphadenopathy:     Cervical: No cervical adenopathy.  Skin:    Findings: No erythema or rash.  Neurological:     Cranial Nerves: No cranial nerve deficit.     Motor: No abnormal muscle tone.     Coordination: Coordination normal.     Deep Tendon Reflexes: Reflexes normal.  Psychiatric:        Behavior: Behavior normal.        Thought Content: Thought content normal.        Judgment: Judgment normal.   stoma LS w/pain B hips w/pain on ROM Lab Results  Component Value Date   WBC 4.2 12/02/2022   HGB 12.7 12/02/2022   HCT 39.7 12/02/2022   PLT 339.0 12/02/2022   GLUCOSE 85 01/13/2022   CHOL 172 05/27/2010   TRIG 100.0 05/27/2010   HDL 46.30 05/27/2010   LDLCALC 106 (H) 05/27/2010   ALT 12 01/13/2022   AST 19 01/13/2022   NA 139 01/13/2022   K 4.0 01/13/2022   CL 106 01/13/2022   CREATININE 1.23 (H) 01/13/2022   BUN 25 (H) 01/13/2022   CO2 24 01/13/2022   TSH 0.86 12/02/2022   INR 1.0 01/05/2018   HGBA1C 5.9 12/17/2009   MICROALBUR 0.6 02/10/2013    DG Bone Density Result Date: 04/28/2023 Table formatting from the original result was not included. Date of study: 04/28/2023 Exam: DUAL X-RAY ABSORPTIOMETRY (DXA) FOR BONE MINERAL DENSITY (BMD) Instrument: [employer] Requesting Provider: PCP Indication:   screening for osteoporosis Comparison: none (please note that it is not possible to compare data from different instruments) Clinical data: Pt is a 68 y.o. female without previous history of fracture. On vitamin D. Results:  Lumbar spine L1-L4 Femoral neck (FN) 33% distal radius T-score -0.5 RFN: -1.6 LFN: -1.0 n/a Assessment: the BMD is low according  to the [facility] classification for osteoporosis (see below). Fracture risk: moderate FRAX score: 10 year major osteoporotic risk: 4.1%. 10 year hip fracture risk: 0.5%. The thresholds for treatment are 20% and 3%, respectively. Comments: the technical quality of the study is good. There is an area of well-circumscribed hyperechogenicity at the site of the right lesser trochanter -this will be discussed with PCP. Evaluation for secondary causes should be considered if clinically indicated. Recommend optimizing calcium (1200 mg/day) and vitamin D (800 IU/day) intake. Followup: Repeat BMD is appropriate after 2 years. WHO criteria for diagnosis of osteoporosis in postmenopausal women and in men 85 y/o or older: - normal: T-score -1.0 to + 1.0 - osteopenia/low bone density: T-score between -2.5 and -1.0 - osteoporosis: T-score below -2.5 - severe osteoporosis: T-score below -2.5 with history of fragility fracture Note: although not part of the WHO classification, the presence of a fragility fracture, regardless of the T-score, should be considered diagnostic of osteoporosis, provided other causes for the fracture have been excluded. Treatment: The National Osteoporosis Foundation recommends that treatment be considered in postmenopausal women and men age 58 or older with: 1. Hip or vertebral (clinical or morphometric) fracture 2. T-score of - 2.5 or lower at the spine or hip 3. 10-year fracture probability by FRAX of at least 20% for a major osteoporotic fracture and 3% for a hip fracture Carlus Pavlov, MD [facility]    Assessment & Plan:   Problem List Items  Addressed This Visit     OBESITY, MORBID   Phentermine   Potential benefits of a long term phentermine  use as well as potential risks  and complications were explained to the patient and were aknowledged.      Relevant Medications   phentermine (ADIPEX-P) 37.5 MG tablet   Anxiety disorder   Doing well at present      Essential hypertension   Off HCTZ      Fibromyalgia syndrome    Tramadol prn  Potential benefits of a long term opioids use as well as potential risks (i.e. addiction risk, apnea etc) and complications (i.e. Somnolence, constipation and others) were explained to the patient and were aknowledged.         Relevant Medications   methylPREDNISolone (MEDROL DOSEPAK) 4 MG TBPK tablet   traMADol (ULTRAM) 50 MG tablet   Hip pain, right - Primary   Abn R hip on DEXA per Dr Elvera Lennox Will get R hip X ray      Relevant Orders   XR HIP UNILAT W OR W/O PELVIS 2-3 VIEWS RIGHT      Meds ordered this encounter  Medications   methylPREDNISolone (MEDROL DOSEPAK) 4 MG TBPK tablet    Sig: As directed for foot pain flare up/gout    Dispense:  21 tablet    Refill:  1   traMADol (ULTRAM) 50 MG tablet    Sig: Take 1 tablet (50 mg total) by mouth every 6 (six) hours as needed.    Dispense:  120 tablet    Refill:  2   phentermine (ADIPEX-P) 37.5 MG tablet    Sig: Take 1 tablet (37.5 mg total) by mouth daily before breakfast.    Dispense:  30 tablet    Refill:  2   cefdinir (OMNICEF) 300 MG capsule    Sig: Take 1 capsule (300 mg total) by mouth 2 (two) times daily.    Dispense:  20 capsule    Refill:  0      Follow-up: Return  in about 3 months (around 08/17/2023) for a follow-up visit.  Sonda Primes, MD

## 2023-05-19 NOTE — Assessment & Plan Note (Signed)
Off HCTZ

## 2023-05-19 NOTE — Assessment & Plan Note (Signed)
Doing well at present

## 2023-05-19 NOTE — Assessment & Plan Note (Signed)
Abn R hip on DEXA per Dr Elvera Lennox Will get R hip X ray

## 2023-05-28 DIAGNOSIS — H16143 Punctate keratitis, bilateral: Secondary | ICD-10-CM | POA: Diagnosis not present

## 2023-05-28 DIAGNOSIS — H524 Presbyopia: Secondary | ICD-10-CM | POA: Diagnosis not present

## 2023-05-28 DIAGNOSIS — H2513 Age-related nuclear cataract, bilateral: Secondary | ICD-10-CM | POA: Diagnosis not present

## 2023-06-30 DIAGNOSIS — Z432 Encounter for attention to ileostomy: Secondary | ICD-10-CM | POA: Diagnosis not present

## 2023-08-18 ENCOUNTER — Ambulatory Visit (INDEPENDENT_AMBULATORY_CARE_PROVIDER_SITE_OTHER)

## 2023-08-18 ENCOUNTER — Encounter: Payer: Self-pay | Admitting: Internal Medicine

## 2023-08-18 ENCOUNTER — Ambulatory Visit: Payer: Medicare Other | Admitting: Internal Medicine

## 2023-08-18 VITALS — BP 124/78 | HR 97 | Temp 97.6°F | Ht 65.0 in | Wt 186.2 lb

## 2023-08-18 DIAGNOSIS — G8929 Other chronic pain: Secondary | ICD-10-CM

## 2023-08-18 DIAGNOSIS — M5412 Radiculopathy, cervical region: Secondary | ICD-10-CM

## 2023-08-18 DIAGNOSIS — M5440 Lumbago with sciatica, unspecified side: Secondary | ICD-10-CM | POA: Diagnosis not present

## 2023-08-18 DIAGNOSIS — F419 Anxiety disorder, unspecified: Secondary | ICD-10-CM

## 2023-08-18 DIAGNOSIS — E538 Deficiency of other specified B group vitamins: Secondary | ICD-10-CM | POA: Diagnosis not present

## 2023-08-18 DIAGNOSIS — M50321 Other cervical disc degeneration at C4-C5 level: Secondary | ICD-10-CM | POA: Diagnosis not present

## 2023-08-18 MED ORDER — HYDROCODONE-ACETAMINOPHEN 7.5-325 MG PO TABS
1.0000 | ORAL_TABLET | Freq: Four times a day (QID) | ORAL | 0 refills | Status: DC | PRN
Start: 2023-08-18 — End: 2023-09-14

## 2023-08-18 MED ORDER — PREDNISONE 10 MG PO TABS: ORAL_TABLET | ORAL | 1 refills | Status: DC

## 2023-08-18 NOTE — Assessment & Plan Note (Signed)
 New - L side Norco prn Prednisone  10 mg: take 4 tabs a day x 3 days; then 3 tabs a day x 4 days; then 2 tabs a day x 4 days, then 1 tab a day x 6 days, then stop. Take pc. X ray C spine Hold Tramadol  MRI if not better

## 2023-08-18 NOTE — Progress Notes (Signed)
 Subjective:  Patient ID: Emily Sanchez, female    DOB: 05-Aug-1955  Age: 68 y.o. MRN: [id number]  CC: Medical Management of Chronic Issues (3 Month Follow Up. Patient notes for the last month and a half she has been experiencing some left neck, shoulder, arm pain. Treating with aspa cream, heating pad, and tramadol . Also having a tingling sensation on that side as well. Also having pain/swelling in joints. Persistent gout in left foot )   HPI Emily Sanchez presents for L neck pain and L shoulder pain - severe C/o tingling in the L arm/hand x 6 weeks. Can't sleep. Pain is 9/10   Outpatient Medications Prior to Visit  Medication Sig Dispense Refill   acetaminophen  (TYLENOL ) 500 MG tablet Take 2 tablets (1,000 mg total) by mouth every 8 (eight) hours. (Patient taking differently: Take 1,000 mg by mouth every 6 (six) hours as needed for moderate pain (pain score 4-6).) 30 tablet 0   allopurinol  (ZYLOPRIM ) 100 MG tablet Take 0.5 tablets (50 mg total) by mouth daily. 45 tablet 3   amLODipine  (NORVASC ) 5 MG tablet Take 1 tablet (5 mg total) by mouth daily. 30 tablet 11   aspirin  EC 81 MG tablet Take 1 tablet (81 mg total) by mouth daily.     cefdinir  (OMNICEF ) 300 MG capsule Take 1 capsule (300 mg total) by mouth 2 (two) times daily. 20 capsule 0   cholecalciferol  (VITAMIN D3) 25 MCG (1000 UNIT) tablet Take 4 tablets (4,000 Units total) by mouth daily. 120 tablet 11   colchicine  0.6 MG tablet Take two tablets prn gout attack.Then take another one in 1-2 hrs. Do not repeat for 3 days. 18 tablet 1   cyanocobalamin  (,VITAMIN B-12,) 1000 MCG/ML injection Inject 1 mL (1,000 mcg total) into the muscle every 14 (fourteen) days. 20 mL 3   cyclobenzaprine  (FLEXERIL ) 5 MG tablet TAKE 1 TABLET BY MOUTH EVERYDAY AT BEDTIME 30 tablet 3   meloxicam  (MOBIC ) 7.5 MG tablet TAKE 1 TABLET BY MOUTH EVERY DAY 30 tablet 1   methylPREDNISolone  (MEDROL  DOSEPAK) 4 MG TBPK tablet As directed for foot pain  flare up/gout 21 tablet 1   pantoprazole  (PROTONIX ) 40 MG tablet Take 1 tablet (40 mg total) by mouth 2 (two) times daily. 180 tablet 3   SYRINGE-NEEDLE, DISP, 3 ML (BD INTEGRA SYRINGE) 25G X 1" 3 ML MISC TO BE USED FOR B12 INJECTIONS 50 each 3   traMADol  (ULTRAM ) 50 MG tablet Take 1 tablet (50 mg total) by mouth every 6 (six) hours as needed. 120 tablet 2   Vitamin D , Ergocalciferol , (DRISDOL ) 1.25 MG (50000 UNIT) CAPS capsule Take 1 capsule (50,000 Units total) by mouth every 7 (seven) days. 8 capsule 0   phentermine  (ADIPEX-P ) 37.5 MG tablet Take 1 tablet (37.5 mg total) by mouth daily before breakfast. 30 tablet 2   No facility-administered medications prior to visit.    ROS: Review of Systems  Constitutional:  Positive for fatigue. Negative for activity change, appetite change, chills and unexpected weight change.  HENT:  Negative for congestion, mouth sores and sinus pressure.   Eyes:  Negative for visual disturbance.  Respiratory:  Negative for cough and chest tightness.   Gastrointestinal:  Negative for abdominal pain and nausea.  Genitourinary:  Negative for difficulty urinating, frequency and vaginal pain.  Musculoskeletal:  Positive for arthralgias, back pain, neck pain and neck stiffness. Negative for gait problem.  Skin:  Negative for pallor and rash.  Neurological:  Negative for dizziness,  tremors, weakness, numbness and headaches.  Hematological:  Bruises/bleeds easily.  Psychiatric/Behavioral:  Positive for sleep disturbance. Negative for confusion and suicidal ideas.     Objective:  BP 124/78   Pulse 97   Temp 97.6 F (36.4 C)   Ht 5\' 5"  (1.651 m)   Wt 186 lb 3.2 oz (84.5 kg)   SpO2 99%   BMI 30.99 kg/m   BP Readings from Last 3 Encounters:  08/18/23 124/78  05/19/23 118/78  02/16/23 110/80    Wt Readings from Last 3 Encounters:  08/18/23 186 lb 3.2 oz (84.5 kg)  05/19/23 188 lb (85.3 kg)  02/16/23 188 lb (85.3 kg)    Physical Exam Constitutional:       General: She is not in acute distress.    Appearance: She is well-developed.  HENT:     Head: Normocephalic.     Right Ear: External ear normal.     Left Ear: External ear normal.     Nose: Nose normal.  Eyes:     General:        Right eye: No discharge.        Left eye: No discharge.     Conjunctiva/sclera: Conjunctivae normal.     Pupils: Pupils are equal, round, and reactive to light.  Neck:     Thyroid : No thyromegaly.     Vascular: No JVD.     Trachea: No tracheal deviation.  Cardiovascular:     Rate and Rhythm: Normal rate and regular rhythm.     Heart sounds: Normal heart sounds.  Pulmonary:     Effort: No respiratory distress.     Breath sounds: No stridor. No wheezing.  Abdominal:     General: Bowel sounds are normal. There is no distension.     Palpations: Abdomen is soft. There is no mass.     Tenderness: There is no abdominal tenderness. There is no guarding or rebound.  Musculoskeletal:        General: Tenderness present.     Cervical back: Normal range of motion and neck supple. No rigidity.  Lymphadenopathy:     Cervical: No cervical adenopathy.  Skin:    Findings: No erythema or rash.  Neurological:     Cranial Nerves: No cranial nerve deficit.     Motor: No abnormal muscle tone.     Coordination: Coordination normal.     Gait: Gait abnormal.     Deep Tendon Reflexes: Reflexes normal.  Psychiatric:        Behavior: Behavior normal.        Thought Content: Thought content normal.        Judgment: Judgment normal.    L neck, shoulder, arm - painful  Lab Results  Component Value Date   WBC 4.2 12/02/2022   HGB 12.7 12/02/2022   HCT 39.7 12/02/2022   PLT 339.0 12/02/2022   GLUCOSE 85 01/13/2022   CHOL 172 05/27/2010   TRIG 100.0 05/27/2010   HDL 46.30 05/27/2010   LDLCALC 106 (H) 05/27/2010   ALT 12 01/13/2022   AST 19 01/13/2022   NA 139 01/13/2022   K 4.0 01/13/2022   CL 106 01/13/2022   CREATININE 1.23 (H) 01/13/2022   BUN 25 (H)  01/13/2022   CO2 24 01/13/2022   TSH 0.86 12/02/2022   INR 1.0 01/05/2018   HGBA1C 5.9 12/17/2009   MICROALBUR 0.6 02/10/2013    DG Bone Density Result Date: 04/28/2023 Table formatting from the original result was not included. Date  of study: 04/28/2023 Exam: DUAL X-RAY ABSORPTIOMETRY (DXA) FOR BONE MINERAL DENSITY (BMD) Instrument: [employer] Requesting Provider: PCP Indication:  screening for osteoporosis Comparison: none (please note that it is not possible to compare data from different instruments) Clinical data: Pt is a 68 y.o. female without previous history of fracture. On vitamin D . Results:  Lumbar spine L1-L4 Femoral neck (FN) 33% distal radius T-score -0.5 RFN: -1.6 LFN: -1.0 n/a Assessment: the BMD is low according to the [facility] classification for osteoporosis (see below). Fracture risk: moderate FRAX score: 10 year major osteoporotic risk: 4.1%. 10 year hip fracture risk: 0.5%. The thresholds for treatment are 20% and 3%, respectively. Comments: the technical quality of the study is good. There is an area of well-circumscribed hyperechogenicity at the site of the right lesser trochanter -this will be discussed with PCP. Evaluation for secondary causes should be considered if clinically indicated. Recommend optimizing calcium (1200 mg/day) and vitamin D  (800 IU/day) intake. Followup: Repeat BMD is appropriate after 2 years. WHO criteria for diagnosis of osteoporosis in postmenopausal women and in men 59 y/o or older: - normal: T-score -1.0 to + 1.0 - osteopenia/low bone density: T-score between -2.5 and -1.0 - osteoporosis: T-score below -2.5 - severe osteoporosis: T-score below -2.5 with history of fragility fracture Note: although not part of the WHO classification, the presence of a fragility fracture, regardless of the T-score, should be considered diagnostic of osteoporosis, provided other causes for the fracture have been excluded. Treatment: The National Osteoporosis Foundation  recommends that treatment be considered in postmenopausal women and men age 29 or older with: 1. Hip or vertebral (clinical or morphometric) fracture 2. T-score of - 2.5 or lower at the spine or hip 3. 10-year fracture probability by FRAX of at least 20% for a major osteoporotic fracture and 3% for a hip fracture Emilie Harden, MD [facility]    Assessment & Plan:   Problem List Items Addressed This Visit     B12 deficiency   Continue with vitamin B12      Anxiety disorder   Stable.  Continue current regimen      Low back pain   Chronic.  No change in therapy, except for the above      Relevant Medications   HYDROcodone -acetaminophen  (NORCO) 7.5-325 MG tablet   predniSONE  (DELTASONE ) 10 MG tablet   Cervical radiculitis - Primary   New - L side Norco prn Prednisone  10 mg: take 4 tabs a day x 3 days; then 3 tabs a day x 4 days; then 2 tabs a day x 4 days, then 1 tab a day x 6 days, then stop. Take pc. X ray C spine Hold Tramadol  MRI if not better       Relevant Orders   DG Cervical Spine Complete (Completed)      Meds ordered this encounter  Medications   HYDROcodone -acetaminophen  (NORCO) 7.5-325 MG tablet    Sig: Take 1 tablet by mouth every 6 (six) hours as needed for severe pain (pain score 7-10).    Dispense:  20 tablet    Refill:  0   predniSONE  (DELTASONE ) 10 MG tablet    Sig: Prednisone  10 mg: take 4 tabs a day x 3 days; then 3 tabs a day x 4 days; then 2 tabs a day x 4 days, then 1 tab a day x 6 days, then stop. Take pc.    Dispense:  38 tablet    Refill:  1  Follow-up: Return in about 4 weeks (around 09/15/2023) for a follow-up visit.  Anitra Barn, MD

## 2023-08-18 NOTE — Patient Instructions (Signed)
 Infrared Sauna Blanket-Sauna Blanket for Home Use

## 2023-08-24 NOTE — Assessment & Plan Note (Signed)
 Chronic.  No change in therapy, except for the above

## 2023-08-24 NOTE — Assessment & Plan Note (Signed)
Continue with vitamin B12 

## 2023-08-24 NOTE — Assessment & Plan Note (Signed)
 Stable  Continue current regimen

## 2023-08-26 ENCOUNTER — Telehealth: Payer: Self-pay

## 2023-08-26 NOTE — Telephone Encounter (Signed)
 Error

## 2023-08-31 DIAGNOSIS — H25013 Cortical age-related cataract, bilateral: Secondary | ICD-10-CM | POA: Diagnosis not present

## 2023-08-31 DIAGNOSIS — H25043 Posterior subcapsular polar age-related cataract, bilateral: Secondary | ICD-10-CM | POA: Diagnosis not present

## 2023-08-31 DIAGNOSIS — H2513 Age-related nuclear cataract, bilateral: Secondary | ICD-10-CM | POA: Diagnosis not present

## 2023-09-14 ENCOUNTER — Encounter: Payer: Self-pay | Admitting: Internal Medicine

## 2023-09-14 ENCOUNTER — Ambulatory Visit (INDEPENDENT_AMBULATORY_CARE_PROVIDER_SITE_OTHER)

## 2023-09-14 ENCOUNTER — Ambulatory Visit (INDEPENDENT_AMBULATORY_CARE_PROVIDER_SITE_OTHER): Admitting: Internal Medicine

## 2023-09-14 DIAGNOSIS — S060X1A Concussion with loss of consciousness of 30 minutes or less, initial encounter: Secondary | ICD-10-CM | POA: Diagnosis not present

## 2023-09-14 DIAGNOSIS — S134XXA Sprain of ligaments of cervical spine, initial encounter: Secondary | ICD-10-CM | POA: Insufficient documentation

## 2023-09-14 DIAGNOSIS — S060XAA Concussion with loss of consciousness status unknown, initial encounter: Secondary | ICD-10-CM | POA: Insufficient documentation

## 2023-09-14 DIAGNOSIS — M1990 Unspecified osteoarthritis, unspecified site: Secondary | ICD-10-CM

## 2023-09-14 DIAGNOSIS — M5440 Lumbago with sciatica, unspecified side: Secondary | ICD-10-CM

## 2023-09-14 DIAGNOSIS — M797 Fibromyalgia: Secondary | ICD-10-CM | POA: Diagnosis not present

## 2023-09-14 DIAGNOSIS — G8929 Other chronic pain: Secondary | ICD-10-CM | POA: Diagnosis not present

## 2023-09-14 DIAGNOSIS — Z041 Encounter for examination and observation following transport accident: Secondary | ICD-10-CM | POA: Diagnosis not present

## 2023-09-14 DIAGNOSIS — M5030 Other cervical disc degeneration, unspecified cervical region: Secondary | ICD-10-CM | POA: Diagnosis not present

## 2023-09-14 MED ORDER — OXYCODONE HCL 5 MG PO TABS
5.0000 mg | ORAL_TABLET | Freq: Four times a day (QID) | ORAL | 0 refills | Status: DC | PRN
Start: 2023-09-14 — End: 2023-10-13
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
## 2023-09-14 NOTE — Patient Instructions (Signed)
 Wedge pillow
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
## 2023-09-14 NOTE — Assessment & Plan Note (Signed)
 Mild Discussed concussion
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
## 2023-09-14 NOTE — Assessment & Plan Note (Signed)
 Contusions - Oxy prn PT
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
## 2023-09-21 ENCOUNTER — Ambulatory Visit: Payer: Self-pay | Admitting: Internal Medicine

## 2023-09-28 ENCOUNTER — Telehealth: Payer: Self-pay
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
## 2023-09-29 NOTE — Telephone Encounter (Signed)
 The patient was referred to physical therapy on 09/14/2023.  Thanks

## 2023-10-13 ENCOUNTER — Encounter: Payer: Self-pay | Admitting: Internal Medicine

## 2023-10-13 ENCOUNTER — Ambulatory Visit: Admitting: Internal Medicine

## 2023-10-13 ENCOUNTER — Ambulatory Visit (INDEPENDENT_AMBULATORY_CARE_PROVIDER_SITE_OTHER)

## 2023-10-13 VITALS — BP 108/84 | HR 109 | Temp 98.3°F | Ht 65.0 in | Wt 187.0 lb

## 2023-10-13 DIAGNOSIS — M25561 Pain in right knee: Secondary | ICD-10-CM

## 2023-10-13 DIAGNOSIS — M5440 Lumbago with sciatica, unspecified side: Secondary | ICD-10-CM | POA: Diagnosis not present

## 2023-10-13 DIAGNOSIS — K50118 Crohn's disease of large intestine with other complication: Secondary | ICD-10-CM

## 2023-10-13 DIAGNOSIS — M25551 Pain in right hip: Secondary | ICD-10-CM | POA: Diagnosis not present

## 2023-10-13 DIAGNOSIS — I10 Essential (primary) hypertension: Secondary | ICD-10-CM

## 2023-10-13 DIAGNOSIS — S134XXD Sprain of ligaments of cervical spine, subsequent encounter: Secondary | ICD-10-CM

## 2023-10-13 DIAGNOSIS — G8929 Other chronic pain: Secondary | ICD-10-CM

## 2023-10-13 DIAGNOSIS — Z96651 Presence of right artificial knee joint: Secondary | ICD-10-CM | POA: Diagnosis not present

## 2023-10-13 DIAGNOSIS — E538 Deficiency of other specified B group vitamins: Secondary | ICD-10-CM | POA: Diagnosis not present

## 2023-10-13 DIAGNOSIS — Z471 Aftercare following joint replacement surgery: Secondary | ICD-10-CM | POA: Diagnosis not present

## 2023-10-13 MED ORDER — "BD INTEGRA SYRINGE 25G X 1"" 3 ML MISC": 3 refills | Status: AC

## 2023-10-13 MED ORDER — OXYCODONE HCL 5 MG PO TABS: 5.0000 mg | ORAL_TABLET | Freq: Four times a day (QID) | ORAL | 0 refills | Status: AC | PRN

## 2023-10-13 MED ORDER — PHENTERMINE HCL 37.5 MG PO TABS: 37.5000 mg | ORAL_TABLET | Freq: Every day | ORAL | 2 refills | Status: AC

## 2023-10-13 MED ORDER — CYANOCOBALAMIN 1000 MCG/ML IJ SOLN: 1000.0000 ug | INTRAMUSCULAR | 3 refills | Status: AC

## 2023-10-13 NOTE — Assessment & Plan Note (Addendum)
 Risks associated with treatment noncompliance were discussed. Compliance was encouraged.  Re-statrt vitamin B12 inj

## 2023-10-13 NOTE — Assessment & Plan Note (Signed)
 C/o R knee pain weak, clicking X ray knee today

## 2023-10-13 NOTE — Assessment & Plan Note (Signed)
S/p colectomy/colostomy 
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
## 2023-10-13 NOTE — Assessment & Plan Note (Signed)
 Off HCTZ
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
## 2023-10-14 ENCOUNTER — Ambulatory Visit: Payer: Self-pay | Admitting: Internal Medicine

## 2023-10-14 MED ORDER — CYANOCOBALAMIN 1000 MCG/ML IJ SOLN
1000.0000 ug | Freq: Once | INTRAMUSCULAR | Status: AC
Start: 2023-10-14 — End: 2023-10-14
  Administered 2023-10-14: 1000 ug via INTRAMUSCULAR

## 2023-10-14 NOTE — Addendum Note (Signed)
 Addended by: HEDDY IP R on: 10/14/2023 01:09 PM   Modules accepted: Orders

## 2023-10-26 DIAGNOSIS — M25551 Pain in right hip: Secondary | ICD-10-CM | POA: Diagnosis not present

## 2023-10-26 DIAGNOSIS — M542 Cervicalgia: Secondary | ICD-10-CM | POA: Diagnosis not present

## 2023-10-26 DIAGNOSIS — M25511 Pain in right shoulder: Secondary | ICD-10-CM | POA: Diagnosis not present

## 2023-10-26 DIAGNOSIS — M6281 Muscle weakness (generalized): Secondary | ICD-10-CM | POA: Diagnosis not present

## 2023-10-26 DIAGNOSIS — M799 Soft tissue disorder, unspecified: Secondary | ICD-10-CM | POA: Diagnosis not present

## 2023-10-29 DIAGNOSIS — M6281 Muscle weakness (generalized): Secondary | ICD-10-CM | POA: Diagnosis not present

## 2023-10-29 DIAGNOSIS — M799 Soft tissue disorder, unspecified: Secondary | ICD-10-CM | POA: Diagnosis not present

## 2023-10-29 DIAGNOSIS — M25511 Pain in right shoulder: Secondary | ICD-10-CM | POA: Diagnosis not present

## 2023-10-29 DIAGNOSIS — M25551 Pain in right hip: Secondary | ICD-10-CM | POA: Diagnosis not present

## 2023-10-29 DIAGNOSIS — M542 Cervicalgia: Secondary | ICD-10-CM | POA: Diagnosis not present

## 2023-11-01 DIAGNOSIS — M25551 Pain in right hip: Secondary | ICD-10-CM | POA: Diagnosis not present

## 2023-11-01 DIAGNOSIS — M6281 Muscle weakness (generalized): Secondary | ICD-10-CM | POA: Diagnosis not present

## 2023-11-01 DIAGNOSIS — M542 Cervicalgia: Secondary | ICD-10-CM | POA: Diagnosis not present

## 2023-11-01 DIAGNOSIS — M799 Soft tissue disorder, unspecified: Secondary | ICD-10-CM | POA: Diagnosis not present

## 2023-11-01 DIAGNOSIS — M25511 Pain in right shoulder: Secondary | ICD-10-CM | POA: Diagnosis not present

## 2023-11-02 ENCOUNTER — Ambulatory Visit: Payer: Medicare Other

## 2023-11-02 VITALS — Ht 65.0 in | Wt 187.0 lb

## 2023-11-02 DIAGNOSIS — Z0000 Encounter for general adult medical examination without abnormal findings: Secondary | ICD-10-CM | POA: Diagnosis not present

## 2023-11-02 DIAGNOSIS — Y93A3 Activity, aerobic and step exercise: Secondary | ICD-10-CM
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
## 2023-11-02 NOTE — Patient Instructions (Signed)
 Emily Sanchez , Thank you for taking time out of your busy schedule to complete your Annual Wellness Visit with me. I enjoyed our conversation and look forward to speaking with you again next year. I, as well as your care team,  appreciate your ongoing commitment to your health goals. Please review the following plan we discussed and let me know if I can assist you in the future. Your Game plan/ To Do List   Follow up Visits: Next Medicare AWV with our clinical staff: Patient would prefer a telephone visit.  11/02/2024.   Have you seen your provider in the last 6 months (3 months if uncontrolled diabetes)? Yes Next Office Visit with your provider: 11/10/2023.    Clinician Recommendations:  Aim for 30 minutes of exercise or brisk walking, 6-8 glasses of water , and 5 servings of fruits and vegetables each day. You are due for a final pneumonia vaccine.        This is a list of the screening recommended for you and due dates:  Health Maintenance  Topic Date Due   Colon Cancer Screening  Never done   Pneumococcal Vaccine for age over 45 (3 of 3 - PCV20 or PCV21) 05/15/2018   Flu Shot  11/19/2023   COVID-19 Vaccine (4 - 2024-25 season) 03/16/2024   DTaP/Tdap/Td vaccine (2 - Tdap) 05/17/2024   Medicare Annual Wellness Visit  11/01/2024   Mammogram  01/31/2025   DEXA scan (bone density measurement)  Completed   Hepatitis C Screening  Completed   Hepatitis B Vaccine  Aged Out   HPV Vaccine  Aged Out   Meningitis B Vaccine  Aged Out   Zoster (Shingles) Vaccine  Discontinued    Advanced directives: (Declined) Advance directive discussed with you today. Even though you declined this today, please call our office should you change your mind, and we can give you the proper paperwork for you to fill out. Advance Care Planning is important because it:  [x]  Makes sure you receive the medical care that is consistent with your values, goals, and preferences  [x]  It provides guidance to your family and  loved ones and reduces their decisional burden about whether or not they are making the right decisions based on your wishes.  Follow the link provided in your after visit summary or read over the paperwork we have mailed to you to help you started getting your Advance Directives in place. If you need assistance in completing these, please reach out to us  so that we can help you!  See attachments for Preventive Care and Fall Prevention Tips.   Managing Pain Without Opioids Opioids are strong medicines used to treat moderate to severe pain. For some people, especially those who have long-term (chronic) pain, opioids may not be the best choice for pain management due to: Side effects like nausea, constipation, and sleepiness. The risk of addiction (opioid use disorder). The longer you take opioids, the greater your risk of addiction. Pain that lasts for more than 3 months is called chronic pain. Managing chronic pain usually requires more than one approach and is often provided by a team of health care providers working together (multidisciplinary approach). Pain management may be done at a pain management center or pain clinic. How to manage pain without the use of opioids Use non-opioid medicines Non-opioid medicines for pain may include: Over-the-counter or prescription non-steroidal anti-inflammatory drugs (NSAIDs). These may be the first medicines used for pain. They work well for muscle and bone pain, and  they reduce swelling. Acetaminophen . This over-the-counter medicine may work well for milder pain but not swelling. Antidepressants. These may be used to treat chronic pain. A certain type of antidepressant (tricyclics) is often used. These medicines are given in lower doses for pain than when used for depression. Anticonvulsants. These are usually used to treat seizures but may also reduce nerve (neuropathic) pain. Muscle relaxants. These relieve pain caused by sudden muscle tightening  (spasms). You may also use a pain medicine that is applied to the skin as a patch, cream, or gel (topical analgesic), such as a numbing medicine. These may cause fewer side effects than medicines taken by mouth. Do certain therapies as directed Some therapies can help with pain management. They include: Physical therapy. You will do exercises to gain strength and flexibility. A physical therapist may teach you exercises to move and stretch parts of your body that are weak, stiff, or painful. You can learn these exercises at physical therapy visits and practice them at home. Physical therapy may also involve: Massage. Heat wraps or applying heat or cold to affected areas. Electrical signals that interrupt pain signals (transcutaneous electrical nerve stimulation, TENS). Weak lasers that reduce pain and swelling (low-level laser therapy). Signals from your body that help you learn to regulate pain (biofeedback). Occupational therapy. This helps you to learn ways to function at home and work with less pain. Recreational therapy. This involves trying new activities or hobbies, such as a physical activity or drawing. Mental health therapy, including: Cognitive behavioral therapy (CBT). This helps you learn coping skills for dealing with pain. Acceptance and commitment therapy (ACT) to change the way you think and react to pain. Relaxation therapies, including muscle relaxation exercises and mindfulness-based stress reduction. Pain management counseling. This may be individual, family, or group counseling.  Receive medical treatments Medical treatments for pain management include: Nerve block injections. These may include a pain blocker and anti-inflammatory medicines. You may have injections: Near the spine to relieve chronic back or neck pain. Into joints to relieve back or joint pain. Into nerve areas that supply a painful area to relieve body pain. Into muscles (trigger point injections) to  relieve some painful muscle conditions. A medical device placed near your spine to help block pain signals and relieve nerve pain or chronic back pain (spinal cord stimulation device). Acupuncture. Follow these instructions at home Medicines Take over-the-counter and prescription medicines only as told by your health care provider. If you are taking pain medicine, ask your health care providers about possible side effects to watch out for. Do not drive or use heavy machinery while taking prescription opioid pain medicine. Lifestyle  Do not use drugs or alcohol  to reduce pain. If you drink alcohol , limit how much you have to: 0-1 drink a day for women who are not pregnant. 0-2 drinks a day for men. Know how much alcohol  is in a drink. In the [place] one drink equals one 12 oz bottle of beer (355 mL), one 5 oz glass of wine (148 mL), or one 1 oz glass of hard liquor (44 mL). Do not use any products that contain nicotine or tobacco. These products include cigarettes, chewing tobacco, and vaping devices, such as e-cigarettes. If you need help quitting, ask your health care provider. Eat a healthy diet and maintain a healthy weight. Poor diet and excess weight may make pain worse. Eat foods that are high in fiber. These include fresh fruits and vegetables, whole grains, and beans. Limit foods that are  high in fat and processed sugars, such as fried and sweet foods. Exercise regularly. Exercise lowers stress and may help relieve pain. Ask your health care provider what activities and exercises are safe for you. If your health care provider approves, join an exercise class that combines movement and stress reduction. Examples include yoga and tai chi. Get enough sleep. Lack of sleep may make pain worse. Lower stress as much as possible. Practice stress reduction techniques as told by your therapist. General instructions Work with all your pain management providers to find the treatments that work  best for you. You are an important member of your pain management team. There are many things you can do to reduce pain on your own. Consider joining an online or in-person support group for people who have chronic pain. Keep all follow-up visits. This is important. Where to find more information You can find more information about managing pain without opioids from: American Academy of Pain Medicine: painmed.org Institute for Chronic Pain: instituteforchronicpain.org American Chronic Pain Association: theacpa.org Contact a health care provider if: You have side effects from pain medicine. Your pain gets worse or does not get better with treatments or home therapy. You are struggling with anxiety or depression. Summary Many types of pain can be managed without opioids. Chronic pain may respond better to pain management without opioids. Pain is best managed when you and a team of health care providers work together. Pain management without opioids may include non-opioid medicines, medical treatments, physical therapy, mental health therapy, and lifestyle changes. Tell your health care providers if your pain gets worse or is not being managed well enough. This information is not intended to replace advice given to you by your health care provider. Make sure you discuss any questions you have with your health care provider. Document Revised: 07/17/2020 Document Reviewed: 07/17/2020 Elsevier Patient Education  2024 [employer].

## 2023-11-08 ENCOUNTER — Telehealth: Payer: Self-pay

## 2023-11-08 DIAGNOSIS — M799 Soft tissue disorder, unspecified: Secondary | ICD-10-CM | POA: Diagnosis not present

## 2023-11-08 DIAGNOSIS — M25551 Pain in right hip: Secondary | ICD-10-CM | POA: Diagnosis not present

## 2023-11-08 DIAGNOSIS — M6281 Muscle weakness (generalized): Secondary | ICD-10-CM | POA: Diagnosis not present

## 2023-11-08 DIAGNOSIS — M25511 Pain in right shoulder: Secondary | ICD-10-CM | POA: Diagnosis not present

## 2023-11-08 DIAGNOSIS — M542 Cervicalgia: Secondary | ICD-10-CM | POA: Diagnosis not present

## 2023-11-08 NOTE — Progress Notes (Signed)
 Complex Care Management Note Care Guide Note  11/08/2023 Name: Emily Sanchez MRN: [id number] DOB: 11/02/1955   Complex Care Management Outreach Attempts: An unsuccessful telephone outreach was attempted today to offer the patient information about available complex care management services.  Follow Up Plan:  Additional outreach attempts will be made to offer the patient complex care management information and services.   Encounter Outcome:  No Answer  Jeoffrey Buffalo , RMA     [place]  [facility], [facility] Guide  Direct Dial: [phone]  Website: [email or web address]

## 2023-11-10 ENCOUNTER — Ambulatory Visit: Admitting: Internal Medicine

## 2023-11-15 DIAGNOSIS — H2513 Age-related nuclear cataract, bilateral: Secondary | ICD-10-CM | POA: Diagnosis not present

## 2023-11-15 DIAGNOSIS — H25043 Posterior subcapsular polar age-related cataract, bilateral: Secondary | ICD-10-CM | POA: Diagnosis not present

## 2023-11-15 DIAGNOSIS — H25013 Cortical age-related cataract, bilateral: Secondary | ICD-10-CM | POA: Diagnosis not present

## 2023-11-15 DIAGNOSIS — H2511 Age-related nuclear cataract, right eye: Secondary | ICD-10-CM | POA: Diagnosis not present

## 2023-11-17 NOTE — Progress Notes (Signed)
 Complex Care Management Note  Care Guide Note 11/17/2023 Name: Chanel Mcadams MRN: [id number] DOB: 01-12-1956  Lorraine Kenesha Moshier is a 68 y.o. year old female who sees Plotnikov, Karlynn GAILS, MD for primary care. I reached out to Lorraine Ethyl Kitty by phone today to offer complex care management services.  Ms. Mayhall was given information about Complex Care Management services today including:   The Complex Care Management services include support from the care team which includes your Nurse Care Manager, Clinical Social Worker, or Pharmacist.  The Complex Care Management team is here to help remove barriers to the health concerns and goals most important to you. Complex Care Management services are voluntary, and the patient may decline or stop services at any time by request to their care team member.   Complex Care Management Consent Status: Patient agreed to services and verbal consent obtained.   Follow up plan:  Telephone appointment with complex care management team member scheduled for:  [facility] 12/13/2023  Encounter Outcome:  Patient Scheduled  Jeoffrey Buffalo , RMA       [facility], [facility] Guide  Direct Dial: [phone]  Website: [email or web address]

## 2023-11-18 ENCOUNTER — Telehealth: Payer: Self-pay

## 2023-11-18 NOTE — Progress Notes (Signed)
   Telephone encounter was:  Unsuccessful.  11/18/2023 Name: Emily Sanchez MRN: [id number] DOB: Aug 02, 1955  Unsuccessful outbound call made today to assist with:  [employer]  Outreach Attempt:  1st Attempt   Jon Colt [facility]  [facility] Guide, Phone: [phone] Fax: [phone] Website: [email or web address]

## 2023-11-22 ENCOUNTER — Telehealth: Payer: Self-pay

## 2023-11-22 NOTE — Progress Notes (Signed)
   Telephone encounter was:  Unsuccessful.  11/22/2023 Name: Emily Sanchez MRN: [id number] DOB: 1956-01-08  Unsuccessful outbound call made today to assist with:  [employer]  Outreach Attempt:  2nd Attempt  No answer and unable to leave a message    Jon Colt [facility]  [facility] Guide, Phone: [phone] Fax: [phone] Website: [email or web address]

## 2023-11-24 ENCOUNTER — Ambulatory Visit: Admitting: Internal Medicine

## 2023-11-24 ENCOUNTER — Telehealth: Payer: Self-pay

## 2023-11-24 NOTE — Progress Notes (Signed)
   Telephone encounter was:  Unsuccessful.  11/24/2023 Name: Francella Barnett MRN: [id number] DOB: 1955-08-03  Unsuccessful outbound call made today to assist with:  [employer]  Outreach Attempt:  3rd Attempt.  Referral closed unable to contact patient.  No answer and unable to leave a message    Jon Colt [facility] Guide, Phone: [phone] Fax: [phone] Website: [email or web address]

## 2023-11-25 ENCOUNTER — Telehealth: Payer: Self-pay | Admitting: Internal Medicine

## 2023-11-25 NOTE — Telephone Encounter (Unsigned)
 Copied from CRM #[id number]. Topic: General - Other >> Nov 24, 2023  4:19 PM Fonda T wrote: Reason for CRM: Received call from patient, to inform she missed appointment today, 11/24/23, due to transportation, and patient reports she is unable to drive. States she will call back to reschedule appointment after she has surgery.   Patient can be reached at [phone] if need to discuss further.

## 2023-11-26 NOTE — Telephone Encounter (Signed)
 Noted. Thanks.

## 2023-12-07 DIAGNOSIS — H25042 Posterior subcapsular polar age-related cataract, left eye: Secondary | ICD-10-CM | POA: Diagnosis not present

## 2023-12-07 DIAGNOSIS — H2511 Age-related nuclear cataract, right eye: Secondary | ICD-10-CM | POA: Diagnosis not present

## 2023-12-07 DIAGNOSIS — H268 Other specified cataract: Secondary | ICD-10-CM | POA: Diagnosis not present

## 2023-12-07 DIAGNOSIS — H2512 Age-related nuclear cataract, left eye: Secondary | ICD-10-CM | POA: Diagnosis not present

## 2023-12-07 DIAGNOSIS — H25012 Cortical age-related cataract, left eye: Secondary | ICD-10-CM | POA: Diagnosis not present

## 2023-12-09 ENCOUNTER — Other Ambulatory Visit: Payer: Self-pay | Admitting: Internal Medicine

## 2023-12-13 ENCOUNTER — Other Ambulatory Visit: Payer: Self-pay

## 2023-12-13 NOTE — Patient Outreach (Signed)
 Complex Care Management   Visit Note  12/13/2023  Name:  Emily Sanchez MRN: [id number] DOB: 1956-03-09  Situation: Referral received for Complex Care Management related to [employer] resource I obtained verbal consent from Patient.  Visit completed with Patient  on the phone  RNCM called to assess patient' needs. [name] states this is not a good time to talk. She states that she is preparing for an eye surgery tomorrow and request to reschedule telephone appointment.  Follow Up Plan:   Telephone follow up appointment date/time:  12/21/23  Heddy Shutter, RN, MSN, BSN, CCM [place]  [facility], Population Health Case Manager Phone: [phone]

## 2023-12-14 DIAGNOSIS — H2512 Age-related nuclear cataract, left eye: Secondary | ICD-10-CM | POA: Diagnosis not present

## 2023-12-14 DIAGNOSIS — H268 Other specified cataract: Secondary | ICD-10-CM | POA: Diagnosis not present

## 2023-12-14 DIAGNOSIS — N189 Chronic kidney disease, unspecified: Secondary | ICD-10-CM | POA: Diagnosis not present

## 2023-12-21 ENCOUNTER — Telehealth: Payer: Self-pay

## 2023-12-21 NOTE — Patient Outreach (Signed)
 Complex Care Management   Visit Note  12/21/2023  Name:  Emily Sanchez MRN: [id number] DOB: 1955-08-24  Situation: RNCM called to complete assessment. Patient states, this is not a good time as she is on her way to a follow up appointment with her eye doctor. Patient request to reschedule visit.  Follow Up Plan:   Telephone follow up appointment date/time:  12/24/23 at 10:30 am  Heddy Shutter, RN, MSN, BSN, CCM [place]  [facility], Population Health Case Manager Phone: [phone]

## 2023-12-24 ENCOUNTER — Emergency Department (HOSPITAL_COMMUNITY): Admission: EM | Admit: 2023-12-24 | Discharge: 2023-12-24 | Disposition: A | Discharge status: Home / Self Care

## 2023-12-24 ENCOUNTER — Ambulatory Visit: Payer: Self-pay

## 2023-12-24 ENCOUNTER — Other Ambulatory Visit: Payer: Self-pay

## 2023-12-24 DIAGNOSIS — N183 Chronic kidney disease, stage 3 unspecified: Secondary | ICD-10-CM | POA: Diagnosis not present

## 2023-12-24 DIAGNOSIS — M62830 Muscle spasm of back: Secondary | ICD-10-CM | POA: Diagnosis not present

## 2023-12-24 DIAGNOSIS — I129 Hypertensive chronic kidney disease with stage 1 through stage 4 chronic kidney disease, or unspecified chronic kidney disease: Secondary | ICD-10-CM | POA: Insufficient documentation

## 2023-12-24 DIAGNOSIS — M545 Low back pain, unspecified: Secondary | ICD-10-CM | POA: Diagnosis present

## 2023-12-24 MED ORDER — METHOCARBAMOL 500 MG PO TABS
500.0000 mg | ORAL_TABLET | Freq: Two times a day (BID) | ORAL | 0 refills | Status: AC
Start: 2023-12-24 — End: ?

## 2023-12-24 MED ORDER — LIDOCAINE 5 % EX PTCH
1.0000 | MEDICATED_PATCH | CUTANEOUS | Status: DC
  Administered 2023-12-24: 1 via TRANSDERMAL
  Filled 2023-12-24: qty 1

## 2023-12-24 MED ORDER — ACETAMINOPHEN 500 MG PO TABS
1000.0000 mg | ORAL_TABLET | Freq: Once | ORAL | Status: AC
  Administered 2023-12-24: 1000 mg via ORAL
  Filled 2023-12-24: qty 2

## 2023-12-24 MED ORDER — LIDOCAINE 5 % EX PTCH
1.0000 | MEDICATED_PATCH | CUTANEOUS | 0 refills | Status: AC
Start: 2023-12-24 — End: ?

## 2023-12-24 MED ORDER — DIAZEPAM 2 MG PO TABS
2.0000 mg | ORAL_TABLET | Freq: Once | ORAL | Status: AC
  Administered 2023-12-24: 2 mg via ORAL
  Filled 2023-12-24: qty 1

## 2023-12-24 MED ORDER — KETOROLAC TROMETHAMINE 15 MG/ML IJ SOLN
15.0000 mg | Freq: Once | INTRAMUSCULAR | Status: AC
  Administered 2023-12-24: 15 mg via INTRAMUSCULAR
  Filled 2023-12-24: qty 1

## 2023-12-24 NOTE — Discharge Instructions (Signed)
 He had improvement with medications in the emergency department.  I have sent Robaxin  which is a muscle relaxer into the pharmacy.  Do not drive or do anything else dangerous after taking this as this will make you drowsy.  I have also sent in a lidocaine  patch.  Follow-up with your orthopedist or your primary care doctor.  Return for any concerning symptoms.

## 2023-12-24 NOTE — Telephone Encounter (Signed)
 I agree. Thank you.
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
## 2023-12-24 NOTE — Patient Instructions (Signed)
 Visit Information  Thank you for taking time to visit with me today. Please don't hesitate to contact me if I can be of assistance to you before our next scheduled appointment.  Following is a copy of your care plan:   Goals Addressed             This Visit's Progress    VBCI RN Care Plan       Problems:  Pain (Acute right side back pain/spasm)  Goal: Todaythe Patient will seek medical attention as evidenced by Patient report or review of chart  Interventions:   Pain Interventions: Pain assessment performed Medications reviewed Discussed importance of adherence to all scheduled medical appointments Completed conference call with PCP Triage nurse  Patient Self-Care Activities:  Attend all scheduled provider appointments Call provider office for new concerns or questions  Take medications as prescribed    Plan:  RNCM will continue to follow           Please call the Suicide and Crisis Lifeline: 988 call the [place]  National Suicide Prevention Lifeline: 636-154-6839 or TTY: 8781977690 TTY (941)052-6824) to talk to a trained counselor call 1-800-273-TALK (toll free, 24 hour hotline) if you are experiencing a Mental Health or Behavioral Health Crisis or need someone to talk to.  The patient verbalized understanding of instructions, educational materials, and care plan provided today and DECLINED offer to receive copy of patient instructions, educational materials, and care plan.   Heddy Shutter, RN, MSN, BSN, CCM [place]  [facility], Population Health Case Manager Phone: [phone]
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
## 2024-02-15 ENCOUNTER — Other Ambulatory Visit: Payer: Self-pay | Admitting: Internal Medicine

## 2024-02-15 DIAGNOSIS — Z1231 Encounter for screening mammogram for malignant neoplasm of breast: Secondary | ICD-10-CM

## 2024-03-07 ENCOUNTER — Ambulatory Visit
Admission: RE | Admit: 2024-03-07 | Discharge: 2024-03-07 | Disposition: A | Source: Ambulatory Visit | Attending: Internal Medicine | Admitting: Internal Medicine

## 2024-03-07 DIAGNOSIS — Z1231 Encounter for screening mammogram for malignant neoplasm of breast: Secondary | ICD-10-CM

## 2024-06-14 ENCOUNTER — Ambulatory Visit (HOSPITAL_BASED_OUTPATIENT_CLINIC_OR_DEPARTMENT_OTHER): Payer: Self-pay | Admitting: Physical Therapy

## 2024-06-21 ENCOUNTER — Ambulatory Visit (HOSPITAL_BASED_OUTPATIENT_CLINIC_OR_DEPARTMENT_OTHER): Payer: Self-pay | Admitting: Physical Therapy

## 2024-06-28 ENCOUNTER — Ambulatory Visit (HOSPITAL_BASED_OUTPATIENT_CLINIC_OR_DEPARTMENT_OTHER): Payer: Self-pay | Admitting: Physical Therapy

## 2024-11-02 ENCOUNTER — Ambulatory Visit
